# Patient Record
Sex: Female | Born: 1960 | Race: White | Hispanic: No | Smoking: Former smoker
Health system: Southern US, Community
[De-identification: ages and names within clinical notes are randomized; demographics above are authoritative.]

## PROBLEM LIST (undated history)

## (undated) DIAGNOSIS — N200 Calculus of kidney: Secondary | ICD-10-CM

## (undated) DIAGNOSIS — F419 Anxiety disorder, unspecified: Secondary | ICD-10-CM

## (undated) DIAGNOSIS — E119 Type 2 diabetes mellitus without complications: Secondary | ICD-10-CM

## (undated) DIAGNOSIS — F32A Depression, unspecified: Secondary | ICD-10-CM

## (undated) DIAGNOSIS — F329 Major depressive disorder, single episode, unspecified: Secondary | ICD-10-CM

## (undated) DIAGNOSIS — E785 Hyperlipidemia, unspecified: Secondary | ICD-10-CM

## (undated) DIAGNOSIS — I10 Essential (primary) hypertension: Secondary | ICD-10-CM

## (undated) HISTORY — PX: ULNAR NERVE REPAIR: SHX2594

## (undated) HISTORY — DX: Type 2 diabetes mellitus without complications: E11.9

## (undated) HISTORY — DX: Anxiety disorder, unspecified: F41.9

## (undated) HISTORY — PX: URETERAL STENT PLACEMENT: SHX822

## (undated) HISTORY — DX: Hyperlipidemia, unspecified: E78.5

## (undated) HISTORY — DX: Major depressive disorder, single episode, unspecified: F32.9

## (undated) HISTORY — DX: Depression, unspecified: F32.A

---

## 1997-12-13 ENCOUNTER — Emergency Department (HOSPITAL_COMMUNITY): Admission: EM | Admit: 1997-12-13 | Discharge: 1997-12-13 | Payer: Self-pay | Admitting: Emergency Medicine

## 1997-12-13 ENCOUNTER — Inpatient Hospital Stay (HOSPITAL_COMMUNITY): Admission: EM | Admit: 1997-12-13 | Discharge: 1997-12-13 | Payer: Self-pay | Admitting: Family Medicine

## 1997-12-17 ENCOUNTER — Inpatient Hospital Stay (HOSPITAL_COMMUNITY): Admission: AD | Admit: 1997-12-17 | Discharge: 1997-12-17 | Payer: Self-pay | Admitting: Obstetrics

## 1998-08-20 ENCOUNTER — Emergency Department (HOSPITAL_COMMUNITY): Admission: EM | Admit: 1998-08-20 | Discharge: 1998-08-20 | Payer: Self-pay

## 1999-01-10 ENCOUNTER — Other Ambulatory Visit: Admission: RE | Admit: 1999-01-10 | Discharge: 1999-01-10 | Payer: Self-pay | Admitting: Obstetrics and Gynecology

## 1999-07-24 ENCOUNTER — Encounter: Payer: Self-pay | Admitting: Emergency Medicine

## 1999-07-24 ENCOUNTER — Emergency Department (HOSPITAL_COMMUNITY): Admission: EM | Admit: 1999-07-24 | Discharge: 1999-07-24 | Payer: Self-pay | Admitting: Emergency Medicine

## 1999-09-18 ENCOUNTER — Encounter: Payer: Self-pay | Admitting: Emergency Medicine

## 1999-09-18 ENCOUNTER — Emergency Department (HOSPITAL_COMMUNITY): Admission: EM | Admit: 1999-09-18 | Discharge: 1999-09-18 | Payer: Self-pay | Admitting: Emergency Medicine

## 2000-01-31 ENCOUNTER — Ambulatory Visit (HOSPITAL_COMMUNITY): Admission: RE | Admit: 2000-01-31 | Discharge: 2000-01-31 | Payer: Self-pay | Admitting: General Surgery

## 2000-01-31 ENCOUNTER — Encounter (HOSPITAL_BASED_OUTPATIENT_CLINIC_OR_DEPARTMENT_OTHER): Payer: Self-pay | Admitting: General Surgery

## 2000-02-14 ENCOUNTER — Encounter (HOSPITAL_BASED_OUTPATIENT_CLINIC_OR_DEPARTMENT_OTHER): Payer: Self-pay | Admitting: General Surgery

## 2000-02-14 ENCOUNTER — Ambulatory Visit (HOSPITAL_COMMUNITY): Admission: RE | Admit: 2000-02-14 | Discharge: 2000-02-14 | Payer: Self-pay | Admitting: General Surgery

## 2000-11-06 ENCOUNTER — Other Ambulatory Visit: Admission: RE | Admit: 2000-11-06 | Discharge: 2000-11-06 | Payer: Self-pay | Admitting: Obstetrics and Gynecology

## 2001-10-10 ENCOUNTER — Emergency Department (HOSPITAL_COMMUNITY): Admission: EM | Admit: 2001-10-10 | Discharge: 2001-10-11 | Payer: Self-pay

## 2001-10-10 ENCOUNTER — Encounter: Payer: Self-pay | Admitting: Emergency Medicine

## 2002-10-26 ENCOUNTER — Other Ambulatory Visit: Admission: RE | Admit: 2002-10-26 | Discharge: 2002-10-26 | Payer: Self-pay | Admitting: Obstetrics and Gynecology

## 2002-10-27 ENCOUNTER — Ambulatory Visit (HOSPITAL_COMMUNITY): Admission: RE | Admit: 2002-10-27 | Discharge: 2002-10-27 | Payer: Self-pay | Admitting: Obstetrics and Gynecology

## 2002-10-27 ENCOUNTER — Encounter: Payer: Self-pay | Admitting: Obstetrics and Gynecology

## 2003-08-05 ENCOUNTER — Encounter: Payer: Self-pay | Admitting: Internal Medicine

## 2003-08-15 ENCOUNTER — Ambulatory Visit (HOSPITAL_COMMUNITY): Admission: RE | Admit: 2003-08-15 | Discharge: 2003-08-15 | Payer: Self-pay | Admitting: Internal Medicine

## 2003-09-03 ENCOUNTER — Emergency Department (HOSPITAL_COMMUNITY): Admission: EM | Admit: 2003-09-03 | Discharge: 2003-09-03 | Payer: Self-pay | Admitting: Emergency Medicine

## 2003-09-06 ENCOUNTER — Inpatient Hospital Stay (HOSPITAL_COMMUNITY): Admission: AD | Admit: 2003-09-06 | Discharge: 2003-09-06 | Payer: Self-pay | Admitting: Obstetrics & Gynecology

## 2003-09-14 ENCOUNTER — Observation Stay (HOSPITAL_COMMUNITY): Admission: EM | Admit: 2003-09-14 | Discharge: 2003-09-15 | Payer: Self-pay | Admitting: Emergency Medicine

## 2003-09-21 ENCOUNTER — Encounter: Admission: RE | Admit: 2003-09-21 | Discharge: 2003-12-20 | Payer: Self-pay | Admitting: Emergency Medicine

## 2004-05-31 IMAGING — CT CT EXTREM LOW BILAT W/ CM
1 of 2 series · 8 of 14 positions shown, 10 images · IV contrast (omnipaque)
Comparison: None.

CLINICAL DATA: Chest pain.
 CT CHEST AND LOWER EXTREMITY LIMITED BILATERAL WITH CONTRAST, 09/03/03, 8999 HOURS
TECHNIQUE: After the intravenous injection of Omnipaque 300, 1 mm spiral images were obtained through the chest.
 CHEST

[Series 2: *don't forget:recon (date) offon · axial · 0.62mm/px · z∈[-275,-56]mm · 8 of 225 slices shown, 10 images]
[im 25/225  soft-tissue]
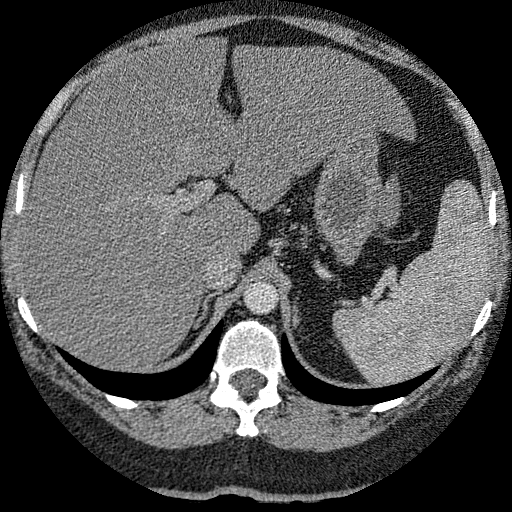
[im 25/225  bone]
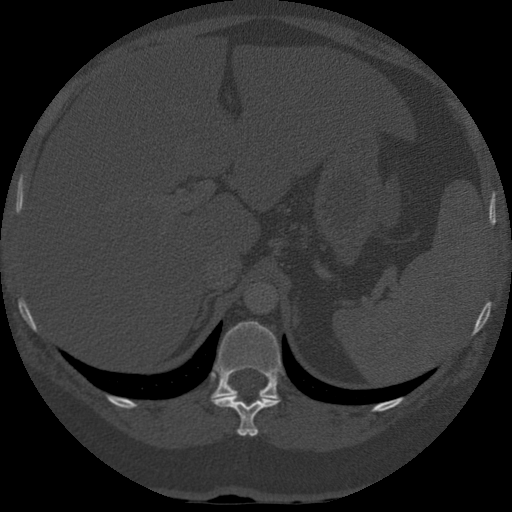
[im 50/225  bone]
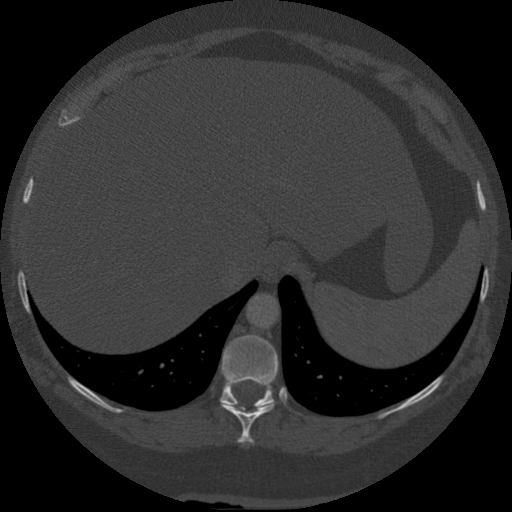
[im 75/225  bone]
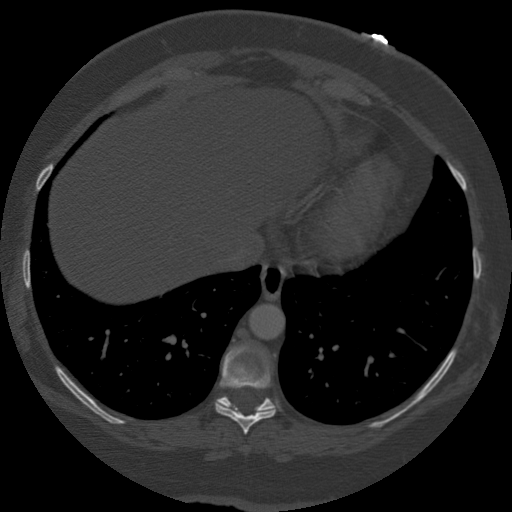
[im 100/225  bone]
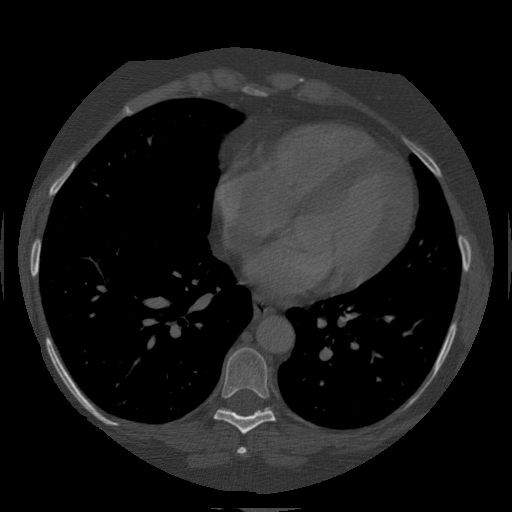
[im 125/225  soft-tissue]
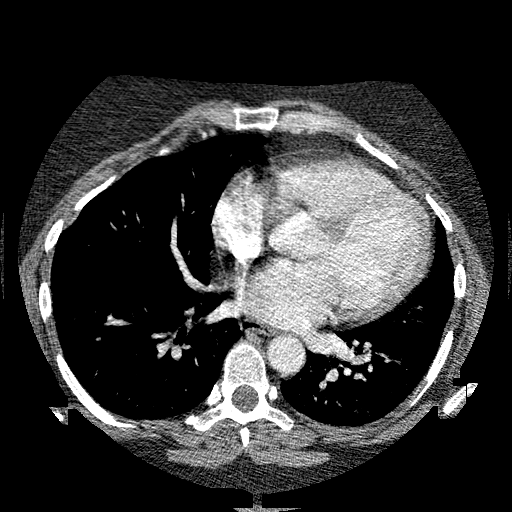
[im 125/225  bone]
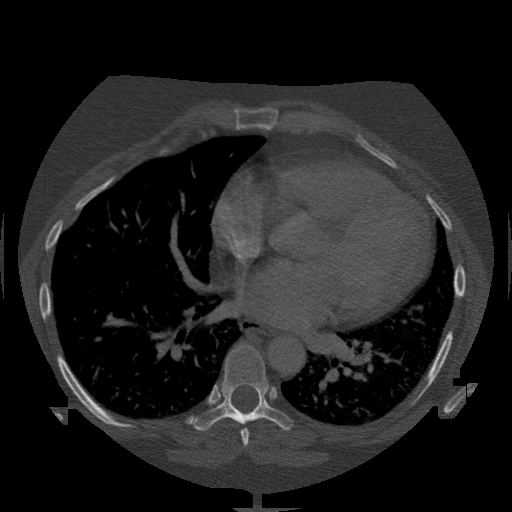
[im 150/225  bone]
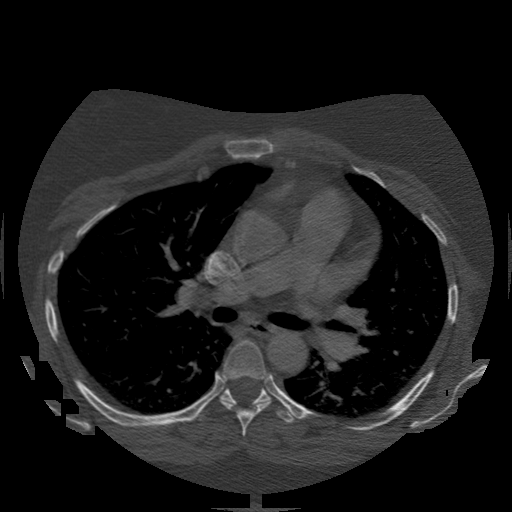
[im 175/225  bone]
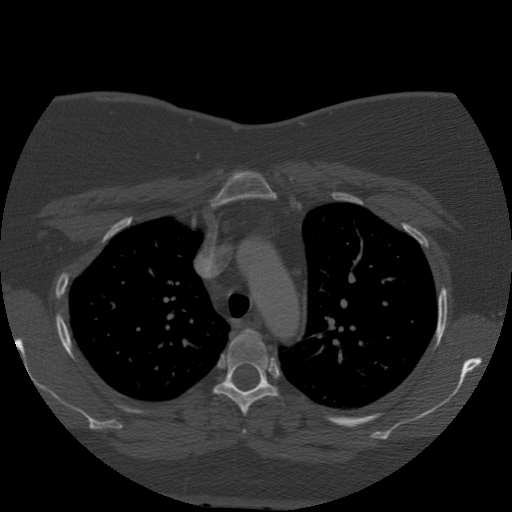
[im 200/225  bone]
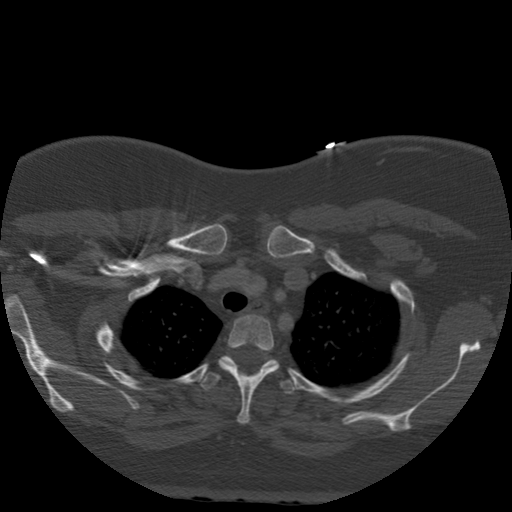

[8 of 14 positions shown; findings below may reference images not displayed]

FINDINGS: There is suboptimal opacification of the pulmonary arteries due to the patient?s body habitus and contrast concentration in the pulmonary vasculature.  No definite filling defects are seen in the pulmonary arterial tree to suggest pulmonary thromboembolism.
 Within the mediastinum, there is no evidence of abnormal adenopathy.  Small nodes are identified.  
 No pneumothoraces or effusions are seen.  
 The lungs are clear.  Images of the upper abdomen demonstrate no focal abnormality.
 IMPRESSION 
 No evidence of pulmonary thromboembolism although the exam was somewhat suboptimal due to respiratory motion and body habitus of the patient.  
 CT LOWER EXTREMITY
FINDINGS: No filling defects are seen in the venous system of the lower extremities to suggest DVT.  A small amount of free fluid is noted in the pelvis, nonspecific.
 IMPRESSION 
 No evidence of DVT.
 [REDACTED]

## 2004-07-05 ENCOUNTER — Emergency Department (HOSPITAL_COMMUNITY): Admission: EM | Admit: 2004-07-05 | Discharge: 2004-07-05 | Payer: Self-pay | Admitting: Family Medicine

## 2004-11-14 ENCOUNTER — Ambulatory Visit: Payer: Self-pay | Admitting: Internal Medicine

## 2004-11-22 ENCOUNTER — Ambulatory Visit (HOSPITAL_COMMUNITY): Admission: RE | Admit: 2004-11-22 | Discharge: 2004-11-22 | Payer: Self-pay | Admitting: Internal Medicine

## 2004-12-03 ENCOUNTER — Ambulatory Visit (HOSPITAL_COMMUNITY): Admission: RE | Admit: 2004-12-03 | Discharge: 2004-12-03 | Payer: Self-pay | Admitting: Obstetrics and Gynecology

## 2004-12-04 ENCOUNTER — Ambulatory Visit: Payer: Self-pay | Admitting: Internal Medicine

## 2004-12-12 ENCOUNTER — Other Ambulatory Visit: Admission: RE | Admit: 2004-12-12 | Discharge: 2004-12-12 | Payer: Self-pay | Admitting: Obstetrics and Gynecology

## 2005-08-08 ENCOUNTER — Emergency Department (HOSPITAL_COMMUNITY): Admission: EM | Admit: 2005-08-08 | Discharge: 2005-08-08 | Payer: Self-pay | Admitting: Emergency Medicine

## 2005-08-14 ENCOUNTER — Ambulatory Visit (HOSPITAL_COMMUNITY): Admission: RE | Admit: 2005-08-14 | Discharge: 2005-08-14 | Payer: Self-pay | Admitting: Obstetrics and Gynecology

## 2005-08-14 ENCOUNTER — Encounter (INDEPENDENT_AMBULATORY_CARE_PROVIDER_SITE_OTHER): Payer: Self-pay | Admitting: Specialist

## 2006-11-18 ENCOUNTER — Emergency Department (HOSPITAL_COMMUNITY): Admission: EM | Admit: 2006-11-18 | Discharge: 2006-11-19 | Payer: Self-pay | Admitting: Emergency Medicine

## 2006-12-29 ENCOUNTER — Other Ambulatory Visit: Admission: RE | Admit: 2006-12-29 | Discharge: 2006-12-29 | Payer: Self-pay | Admitting: Obstetrics and Gynecology

## 2007-05-18 ENCOUNTER — Ambulatory Visit (HOSPITAL_COMMUNITY): Admission: RE | Admit: 2007-05-18 | Discharge: 2007-05-18 | Payer: Self-pay | Admitting: *Deleted

## 2008-03-14 ENCOUNTER — Other Ambulatory Visit: Admission: RE | Admit: 2008-03-14 | Discharge: 2008-03-14 | Payer: Self-pay | Admitting: Obstetrics and Gynecology

## 2008-05-27 DIAGNOSIS — Z860100 Personal history of colon polyps, unspecified: Secondary | ICD-10-CM | POA: Insufficient documentation

## 2008-06-02 ENCOUNTER — Ambulatory Visit: Payer: Self-pay | Admitting: Internal Medicine

## 2008-06-02 LAB — CONVERTED CEMR LAB
ALT: 51 units/L — ABNORMAL HIGH (ref 0–35)
AST: 41 units/L — ABNORMAL HIGH (ref 0–37)
Albumin: 4 g/dL (ref 3.5–5.2)
Alkaline Phosphatase: 66 units/L (ref 39–117)
Amylase: 26 units/L — ABNORMAL LOW (ref 27–131)
Basophils Absolute: 0.1 10*3/uL (ref 0.0–0.1)
Basophils Relative: 0.6 % (ref 0.0–3.0)
Bilirubin, Direct: 0.1 mg/dL (ref 0.0–0.3)
Eosinophils Absolute: 0.2 10*3/uL (ref 0.0–0.7)
Eosinophils Relative: 2 % (ref 0.0–5.0)
HCT: 41.2 % (ref 36.0–46.0)
Hemoglobin: 13.9 g/dL (ref 12.0–15.0)
Lipase: 23 units/L (ref 11.0–59.0)
Lymphocytes Relative: 36.4 % (ref 12.0–46.0)
MCHC: 33.6 g/dL (ref 30.0–36.0)
MCV: 89.3 fL (ref 78.0–100.0)
Monocytes Absolute: 0.5 10*3/uL (ref 0.1–1.0)
Monocytes Relative: 6 % (ref 3.0–12.0)
Neutro Abs: 4.9 10*3/uL (ref 1.4–7.7)
Neutrophils Relative %: 55 % (ref 43.0–77.0)
Platelets: 247 10*3/uL (ref 150–400)
RBC: 4.62 M/uL (ref 3.87–5.11)
RDW: 12.9 % (ref 11.5–14.6)
Total Bilirubin: 0.8 mg/dL (ref 0.3–1.2)
Total Protein: 7 g/dL (ref 6.0–8.3)
WBC: 8.9 10*3/uL (ref 4.5–10.5)

## 2008-06-08 ENCOUNTER — Ambulatory Visit (HOSPITAL_COMMUNITY): Admission: RE | Admit: 2008-06-08 | Discharge: 2008-06-08 | Payer: Self-pay | Admitting: Internal Medicine

## 2008-06-15 ENCOUNTER — Ambulatory Visit (HOSPITAL_COMMUNITY): Admission: RE | Admit: 2008-06-15 | Discharge: 2008-06-15 | Payer: Self-pay | Admitting: Internal Medicine

## 2008-06-16 ENCOUNTER — Telehealth: Payer: Self-pay | Admitting: Internal Medicine

## 2008-06-21 ENCOUNTER — Ambulatory Visit: Payer: Self-pay | Admitting: Internal Medicine

## 2008-06-21 LAB — CONVERTED CEMR LAB
A-1 Antitrypsin, Ser: 133 mg/dL (ref 83–200)
Anti Nuclear Antibody(ANA): NEGATIVE
Ceruloplasmin: 51 mg/dL (ref 21–63)
Ferritin: 150.3 ng/mL (ref 10.0–291.0)
HCV Ab: NEGATIVE
Hep B Core Total Ab: NEGATIVE
Hep B S Ab: NEGATIVE
Hepatitis B Surface Ag: NEGATIVE

## 2009-01-12 ENCOUNTER — Emergency Department (HOSPITAL_COMMUNITY): Admission: EM | Admit: 2009-01-12 | Discharge: 2009-01-12 | Payer: Self-pay | Admitting: Family Medicine

## 2009-05-30 ENCOUNTER — Other Ambulatory Visit: Admission: RE | Admit: 2009-05-30 | Discharge: 2009-05-30 | Payer: Self-pay | Admitting: Obstetrics and Gynecology

## 2010-09-23 ENCOUNTER — Encounter: Payer: Self-pay | Admitting: Internal Medicine

## 2010-11-04 ENCOUNTER — Inpatient Hospital Stay (HOSPITAL_COMMUNITY)
Admission: EM | Admit: 2010-11-04 | Discharge: 2010-11-05 | DRG: 694 | Disposition: A | Discharge status: Home / Self Care | Payer: PRIVATE HEALTH INSURANCE | Attending: Urology | Admitting: Urology

## 2010-11-04 DIAGNOSIS — N201 Calculus of ureter: Principal | ICD-10-CM | POA: Diagnosis present

## 2010-11-04 DIAGNOSIS — G43909 Migraine, unspecified, not intractable, without status migrainosus: Secondary | ICD-10-CM | POA: Diagnosis present

## 2010-11-04 DIAGNOSIS — Z794 Long term (current) use of insulin: Secondary | ICD-10-CM

## 2010-11-04 DIAGNOSIS — E7800 Pure hypercholesterolemia, unspecified: Secondary | ICD-10-CM | POA: Diagnosis present

## 2010-11-04 DIAGNOSIS — I10 Essential (primary) hypertension: Secondary | ICD-10-CM | POA: Diagnosis present

## 2010-11-04 DIAGNOSIS — N10 Acute tubulo-interstitial nephritis: Secondary | ICD-10-CM | POA: Diagnosis present

## 2010-11-04 DIAGNOSIS — E119 Type 2 diabetes mellitus without complications: Secondary | ICD-10-CM | POA: Diagnosis present

## 2010-11-04 HISTORY — DX: Essential (primary) hypertension: I10

## 2010-11-05 ENCOUNTER — Encounter (HOSPITAL_COMMUNITY): Payer: Self-pay | Admitting: Radiology

## 2010-11-05 ENCOUNTER — Emergency Department (HOSPITAL_COMMUNITY): Payer: PRIVATE HEALTH INSURANCE

## 2010-11-05 ENCOUNTER — Inpatient Hospital Stay (HOSPITAL_COMMUNITY)
Admission: AD | Admit: 2010-11-05 | Discharge: 2010-11-08 | Disposition: A | Discharge status: Home / Self Care | Payer: PRIVATE HEALTH INSURANCE | Attending: Urology | Admitting: Urology

## 2010-11-05 DIAGNOSIS — I10 Essential (primary) hypertension: Secondary | ICD-10-CM | POA: Diagnosis present

## 2010-11-05 DIAGNOSIS — R509 Fever, unspecified: Secondary | ICD-10-CM | POA: Diagnosis present

## 2010-11-05 DIAGNOSIS — Z794 Long term (current) use of insulin: Secondary | ICD-10-CM

## 2010-11-05 DIAGNOSIS — E119 Type 2 diabetes mellitus without complications: Secondary | ICD-10-CM | POA: Diagnosis present

## 2010-11-05 DIAGNOSIS — N201 Calculus of ureter: Secondary | ICD-10-CM | POA: Diagnosis present

## 2010-11-05 DIAGNOSIS — N12 Tubulo-interstitial nephritis, not specified as acute or chronic: Secondary | ICD-10-CM | POA: Diagnosis present

## 2010-11-05 LAB — CBC
HCT: 38.9 % (ref 36.0–46.0)
Hemoglobin: 13 g/dL (ref 12.0–15.0)
MCH: 28.8 pg (ref 26.0–34.0)
MCHC: 33.4 g/dL (ref 30.0–36.0)
MCV: 86.1 fL (ref 78.0–100.0)
Platelets: 161 10*3/uL (ref 150–400)
RBC: 4.52 MIL/uL (ref 3.87–5.11)
RDW: 13.7 % (ref 11.5–15.5)
WBC: 14.2 10*3/uL — ABNORMAL HIGH (ref 4.0–10.5)

## 2010-11-05 LAB — POCT I-STAT, CHEM 8
BUN: 6 mg/dL (ref 6–23)
Calcium, Ion: 1.13 mmol/L (ref 1.12–1.32)
Creatinine, Ser: 0.8 mg/dL (ref 0.4–1.2)
Glucose, Bld: 145 mg/dL — ABNORMAL HIGH (ref 70–99)
HCT: 40 % (ref 36.0–46.0)
Sodium: 135 mEq/L (ref 135–145)
TCO2: 23 mmol/L (ref 0–100)

## 2010-11-05 LAB — DIFFERENTIAL
Basophils Absolute: 0 10*3/uL (ref 0.0–0.1)
Basophils Relative: 0 % (ref 0–1)
Blasts: 0 %
Eosinophils Absolute: 0 10*3/uL (ref 0.0–0.7)
Eosinophils Relative: 0 % (ref 0–5)
Lymphocytes Relative: 12 % (ref 12–46)
Lymphs Abs: 1.7 10*3/uL (ref 0.7–4.0)
Metamyelocytes Relative: 1 %
Monocytes Absolute: 0.1 10*3/uL (ref 0.1–1.0)
Monocytes Relative: 1 % — ABNORMAL LOW (ref 3–12)
Myelocytes: 0 %
Neutro Abs: 12.4 10*3/uL — ABNORMAL HIGH (ref 1.7–7.7)
Neutrophils Relative %: 58 % (ref 43–77)
Promyelocytes Absolute: 0 %

## 2010-11-05 LAB — GLUCOSE, CAPILLARY
Glucose-Capillary: 147 mg/dL — ABNORMAL HIGH (ref 70–99)
Glucose-Capillary: 120 mg/dL — ABNORMAL HIGH (ref 70–99)
Glucose-Capillary: 194 mg/dL — ABNORMAL HIGH (ref 70–99)
Glucose-Capillary: 216 mg/dL — ABNORMAL HIGH (ref 70–99)

## 2010-11-05 LAB — URINALYSIS, ROUTINE W REFLEX MICROSCOPIC
Bilirubin Urine: NEGATIVE
Ketones, ur: NEGATIVE mg/dL
Nitrite: NEGATIVE
Protein, ur: NEGATIVE mg/dL
Specific Gravity, Urine: <1.005 — ABNORMAL LOW (ref 1.005–1.030)
Urobilinogen, UA: 0.2 mg/dL (ref 0.0–1.0)
pH: 7 (ref 5.0–8.0)

## 2010-11-05 LAB — URINE MICROSCOPIC-ADD ON

## 2010-11-05 MED ORDER — IOHEXOL 300 MG/ML  SOLN
100.0000 mL | Freq: Once | INTRAMUSCULAR | Status: AC | PRN
  Administered 2010-11-05: 100 mL via INTRAVENOUS

## 2010-11-06 LAB — URINE CULTURE: Culture  Setup Time: 201203050545

## 2010-11-06 LAB — GENTAMICIN LEVEL, RANDOM: Gentamicin Rm: 2.3 ug/mL

## 2010-11-06 LAB — CBC
HCT: 31.3 % — ABNORMAL LOW (ref 36.0–46.0)
MCHC: 31 g/dL (ref 30.0–36.0)
MCV: 88.7 fL (ref 78.0–100.0)
RDW: 14.3 % (ref 11.5–15.5)

## 2010-11-06 LAB — BASIC METABOLIC PANEL
BUN: 10 mg/dL (ref 6–23)
GFR calc non Af Amer: >60 mL/min (ref >60)
Glucose, Bld: 180 mg/dL — ABNORMAL HIGH (ref 70–99)
Potassium: 3.3 mEq/L — ABNORMAL LOW (ref 3.5–5.1)

## 2010-11-07 LAB — GLUCOSE, CAPILLARY
Glucose-Capillary: 127 mg/dL — ABNORMAL HIGH (ref 70–99)
Glucose-Capillary: 175 mg/dL — ABNORMAL HIGH (ref 70–99)
Glucose-Capillary: 157 mg/dL — ABNORMAL HIGH (ref 70–99)

## 2010-11-08 LAB — GLUCOSE, CAPILLARY

## 2010-11-14 NOTE — Op Note (Addendum)
  NAMEMANJIT, BUFANO NO.:  [id number]  MEDICAL RECORD NO.:  [id number]           PATIENT TYPE:  I  LOCATION:  1411                         FACILITY:  [facility]  PHYSICIAN:  Bertram Millard. Delno Blaisdell, M.D.DATE OF BIRTH:  [birth year]  DATE OF PROCEDURE:  11/05/2010 DATE OF DISCHARGE:                              OPERATIVE REPORT   PREOPERATIVE DIAGNOSES:  Infected, obstructing right upper ureteral stone.  POSTOPERATIVE DIAGNOSIS:  Infected, obstructing right upper ureteral stone.  PRINCIPAL PROCEDURE:  Cystoscopy, right double-J stent placement (24 cm x 6-French without string).  SURGEON:  Bertram Millard. Tadan Shill, M.D.  ANESTHESIA:  General endotracheal.  COMPLICATIONS:  None.  BRIEF HISTORY:  A 50 year old female who presented yesterday to the emergency room at [facility] with right-sided pain and fever. The pain started about a day before.  The patient developed fever up to 102, as well as rigors.  Upon presentation, she was found to have an obstructing right-sided upper ureteral stone, 5 mm in size, as well as infected urine.  The patient's temperature was 102.  Urology was consulted.  I discussed the situation with the patient and her son, Ivin Booty.  As she is diabetic, and has been infected, obstructive stone, it was recommended that she undergo emergent stent placement.  Risks and complications of procedure have been discussed.  She understands these and desires to proceed.  DESCRIPTION OF PROCEDURE:  The patient was identified and marked in TCU. She was taken to the operating room were general anesthetic was administered.  She was placed in the dorsal lithotomy position. Genitalia and perineum were prepped and draped.  Time-out was then called.  Procedure then commenced.  A 22-French Panendoscope was advanced into her bladder which was drained.  Cursory inspection of the bladder was normal.  The right ureteral orifice was cannulated with  sensitive guidewire which advanced passed the stone in the right renal pelvis. Once good curl was seen, 6-French x 24 cm contour double-J stent was placed over top of the guidewire.  After adequate positioning, the guidewire was removed.  Good proximal distal curls were seen on the stent.  At this point, the bladder was drained and the procedure terminated.  The scope was removed.  The patient tolerated the procedure well.  She was awakened and taken to PACU in stable condition.     Bertram Millard. Retta Diones, M.D.     SMD/MEDQ  D:  11/05/2010  T:  11/05/2010  Job:  [id number]  Electronically Signed by Marcine Matar M.D. on 11/14/2010 10:32:49 AM

## 2010-11-16 NOTE — Discharge Summary (Addendum)
  Courtney Parks, SELBE NO.:  [id number]  MEDICAL RECORD NO.:  [id number]           PATIENT TYPE:  I  LOCATION:  1411                         FACILITY:  [facility]  PHYSICIAN:  Maretta Bees. Vonita Moss, M.D.DATE OF BIRTH:  08-Nov-1960  DATE OF ADMISSION:  11/05/2010 DATE OF DISCHARGE:                              DISCHARGE SUMMARY   FINAL DIAGNOSES: 1. Right upper ureteral stone. 2. Right pyelonephritis. 3. Diabetes. 4. Migraine headaches. 5. Hypertension.  PROCEDURES:  Cystoscopy and right double-J catheter placement on November 05, 2010.  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  On admission, blood pressure was 138/62, pulse 111, temperature 99.8.  She had some right CVA tenderness.  HOSPITAL COURSE:  After admission, she underwent cystoscopy and right ureteral catheter placement.  Urine was cultured and grew Klebsiella. It was sensitive to both Cipro and gentamicin.  She was initially given gentamicin, and then on the day of discharge, given another dose.  In the meantime, she has been on Cipro.  She had some fever last night but she is feeling quite well and voiding well, except for slightly bloody urine.  She is taking fluids well and wishes to go home.  She looks clinically stable with normal vital signs other than the fever last night.  She is afebrile this morning.  She knows to take her temperature and return p.r.n. any problem with clinical deterioration.  She will resume her diabetic diet.  She was sent home in improved condition.  She will return to the office next week in followup.  In addition to going home on Cipro 0.5 mg b.i.d. and oxycodone 5/325.  DISCHARGE MEDICATIONS: 1. Toprol XL 100 mg daily. 2. Diovan HCT 320/25 daily. 3. Welchol 625 mg b.i.d. 4. Ibuprofen p.r.n. 5. Lantus insulin 50 units each morning. 6. Multivitamins. 7. Calcium  carbonate over-the-counter. 8. Metformin 1000 mg daily.     Maretta Bees. Vonita Moss, M.D.     LJP/MEDQ  D:  11/08/2010  T:  11/08/2010  Job:  [id number]  Electronically Signed by Larey Dresser M.D. on 11/16/2010 09:30:36 AM

## 2010-11-19 ENCOUNTER — Ambulatory Visit (HOSPITAL_COMMUNITY)
Admission: RE | Admit: 2010-11-19 | Discharge: 2010-11-19 | Disposition: A | Discharge status: Home / Self Care | Payer: PRIVATE HEALTH INSURANCE | Source: Ambulatory Visit | Attending: Urology | Admitting: Urology

## 2010-11-19 DIAGNOSIS — N201 Calculus of ureter: Secondary | ICD-10-CM | POA: Insufficient documentation

## 2010-11-19 DIAGNOSIS — N1330 Unspecified hydronephrosis: Secondary | ICD-10-CM | POA: Insufficient documentation

## 2010-11-19 DIAGNOSIS — E119 Type 2 diabetes mellitus without complications: Secondary | ICD-10-CM | POA: Insufficient documentation

## 2010-11-20 LAB — GLUCOSE, CAPILLARY: Glucose-Capillary: 246 mg/dL — ABNORMAL HIGH (ref 70–99)

## 2010-11-27 ENCOUNTER — Other Ambulatory Visit (HOSPITAL_COMMUNITY): Payer: Self-pay | Admitting: Urology

## 2010-11-27 DIAGNOSIS — T83122A Displacement of urinary stent, initial encounter: Secondary | ICD-10-CM

## 2010-12-21 NOTE — Discharge Summary (Signed)
  NAMERAINIE, CRENSHAW NO.:  [id number]  MEDICAL RECORD NO.:  [id number]           PATIENT TYPE:  I  LOCATION:  2550                         FACILITY:  [facility]  PHYSICIAN:  Bertram Millard. Joniah Bednarski, M.D.DATE OF BIRTH:  [birth year]  DATE OF ADMISSION:  11/04/2010 DATE OF DISCHARGE:  11/05/2010                              DISCHARGE SUMMARY   PRINCIPAL PROCEDURE:  Cystoscopy, right double-J stent placement.  BRIEF HISTORY:  A 50 year old female with fever and right-sided pain. She had a fever to 102, and was found, upon presentation to the emergency room, to have a 5 mm upper ureteral stone.  She was admitted for emergent ureteral catheter placement, and will eventually undergo lithotripsy or secondary procedure for her stone.  Admission data included a white count of 14,000, and normal hematocrit of 39%, and a normal platelet count.  Basic metabolic profile was normal except for glucose of 145.  CT revealed right proximal ureteral stone with hydronephrosis. Urinalysis was infected.  HOSPITAL COURSE:  The patient was admitted to the operating room, where she underwent cystoscopy and double-J stent placement.  This was performed without difficulty.  She was sent to the floor postoperatively, and did well, without significant febrile response. She was placed on IV antibiotics.  By November 05, 2010, she was in improved condition, and was felt that she could be discharged.  She was discharged in improved condition.  She will follow up with me in approximately 1-2 weeks.  DISCHARGE MEDICATIONS:  Antibiotic and oxybutynin.     Bertram Millard. Retta Diones, M.D.     SMD/MEDQ  D:  12/19/2010  T:  12/20/2010  Job:  [id number]  Electronically Signed by Marcine Matar M.D. on 12/21/2010 07:47:14 AM

## 2011-01-18 NOTE — H&P (Signed)
NAMEFLORENTINA, Parks NO.:  [id number]   MEDICAL RECORD NO.:  [id number]                   PATIENT TYPE:  OBV   LOCATION:  1824                                 FACILITY:  [facility]   PHYSICIAN:  Quita Skye. Waldon Reining, MD             DATE OF BIRTH:  1961/08/18   DATE OF ADMISSION:  09/13/2003  DATE OF DISCHARGE:                                HISTORY & PHYSICAL   IDENTIFYING DATA AND CHIEF COMPLAINT:  Courtney Parks is a 50 year old white  woman who is admitted to [facility] for further evaluation of chest  pain.   HISTORY OF PRESENT ILLNESS:  The patient presented to the emergency  department via EMS after experiencing an episode of chest pain at home.  This occurred while she was sleeping.  The chest discomfort is described as  a sharp and pressure sensation, which began in a focal area in the left  inframammary area.  It radiating down her left leg and up to her neck.  It  was not associated with dyspnea, diaphoresis or nausea.  There were no  exacerbating or ameliorating factors.  She also noted a full sensation in  her throat.  The total duration of chest discomfort was approximately five  minutes.  She was given nitroglycerin by EMS, but her chest discomfort had  already resolved by then.  The chest discomfort appeared not to be related  to position, activity, meals or respirations.  She is free of chest pain at  this time.   The patient had a similar such episode 10 days ago, which also brought her  to the emergency department.  She was discharged with the recommendation to  follow up with Dr. Donnie Aho.  She has seen Dr. Donnie Aho and a stress Cardiolite  has been scheduled, but not yet performed.  The patient feels well and  asymptomatic at this time.   The patient has no past history of cardiac disease including no history of  chest pain, myocardial infarction, coronary artery disease, or congestive  heart failure.  She has a history of  hypertension for several years.  Hyperlipidemia and diabetes mellitus have been recently diagnosed.  There is  no history of smoking.  There is no family history of early coronary artery  disease.   PAST MEDICAL HISTORY:  The patient has no other medical problems.   ALLERGIES:  The patient is reportedly allergic to CODEINE; causes nausea.   MEDICATIONS:  The patient's current medications include:  1. Diovan 160 mg p.o. daily.  2. Glucophage 500 mg p.o. daily.  3. Toprol XL 100 mg p.o. daily.  4. Aspirin 81 mg p.o. daily.   PAST SURGICAL HISTORY:  Previous operations have included a left ulnar nerve  release as well as a septoplasty.   ACCIDENTS AND INJURIES:  Significant injuries; none.   SOCIAL HISTORY:  The patient is divorced.  She lives with her  65 year old  son.  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  She does not drink.  She  does not use illicit drugs.   FAMILY HISTORY:  The patient's mother is alive and well with the exception  of hypertension.  Her mother has been treated for colon cancer in the past.  Her father is alive and well as best as she knows, but with hypertension.  The patient has a sister and a brother, both of whom are well.   REVIEW OF SYSTEMS:  Review of systems reveals no problems related to her  head, eyes, ears, nose, mouth, throat, lungs, gastrointestinal system,  genitourinary system, and extremities.  There is no history of neurologic or  psychiatric disorder.  There is no history of fever, chills or weight loss.   PHYSICAL EXAMINATION:  VITAL SIGNS:  Blood pressure 132/71, pulse 87 and  regular, respirations 20 and temperature 98.4.  GENERAL APPEARANCE:  The patient is an obese middle-aged white woman in no  discomfort.  She is alert, oriented, appropriate and responsive.  HEENT:  Head, eyes, nose and mouth are normal.  NECK:  The neck is without thyromegaly or adenopathy.  Carotid pulses are  palpable bilaterally and without bruits.  HEART:   Cardiac examination reveals a normal S1 and S2.  There is no S3, S4,  murmur, rub, or click.  Cardiac rhythm is regular.  CHEST:  No chest wall tenderness is noted.  LUNGS:  The lungs are clear.  ABDOMEN:  The abdomen is soft and nontender.  There is no mass,  hepatosplenomegaly, bruit, distention, rebound, or rigidity.  Bowel sounds  are normal.  BREASTS, PELVIC AND RECTAL:  Breast, pelvic and rectal examinations are not  performed as they are not pertinent to the reason for acute care  hospitalization.  EXTREMITIES:  The extremities are without edema, deviation or deformity.  Radial and dorsalis pedal pulses are palpable bilaterally.  NEUROLOGIC EXAMINATION:  Brief screening neurologic survey is unremarkable.   LABORATORY DATA:  The electrocardiogram is normal.  The chest radiograph is  normal according to the radiologist.  The initial set of cardiac markers  revealed a myoglobin of 20.1, CK MB 1.8 and troponin less than 0.05.  Potassium 4.1, BUN 14 and creatinine 0.3.  The remaining studies are pending  at the time of this dictation.   IMPRESSION:  1. Chest pain, rule out coronary artery disease.  Description of chest pain     is detailed above.  2. Hypertension.  3. Hyperlipidemia.  4. Diabetes mellitus.   PLAN:  1. Telemetry.  2. Serial cardiac enzymes.  3. Aspirin.  4. Heparin.  5. Nitrol Paste.  6. Chest and abdominal CT scans to exclude pulmonary embolus and aortic     dissection.  7. Further measures per Dr. Donnie Aho.   ADDENDUM:  The patient's son was present throughout the patient encounter.                                               Quita Skye. Waldon Reining, MD   MSC/MEDQ  D:  09/14/2003  T:  09/14/2003  Job:  [id number]

## 2011-01-18 NOTE — Cardiovascular Report (Signed)
Courtney Parks, Courtney Parks NO.:  [id number]   MEDICAL RECORD NO.:  [id number]                   PATIENT TYPE:  OBV   LOCATION:  4739                                 FACILITY:  [facility]   PHYSICIAN:  W. Ashley Royalty., M.D.         DATE OF BIRTH:  22-Aug-1961   DATE OF PROCEDURE:  09/15/2003  DATE OF DISCHARGE:                              CARDIAC CATHETERIZATION   HISTORY:  A 50 year old female with diabetes, hypertension, and  hyperlipidemia.  She recently has had two prolonged episodes of chest pain  that precipitated emergency room visits and was admitted to rule out an MI.   COMMENTS ABOUT PROCEDURE:  The patient tolerated the procedure well without  difficulty.  The right femoral artery was entered using a single anterior  needle wall stick.  Following the procedure she had good hemostasis and  peripheral pulses present.   HEMODYNAMIC DATA:  1. Aorta post contrast 144/87.  2. LV post contrast 144/9-19.   ANGIOGRAPHIC DATA:  1. Left ventriculogram:  Performed in the 30 degree RAO projection.  The     aortic valve was normal.  The mitral valve was normal.  The left     ventricle appears normal in size.  Estimated ejection fraction is 60%.     Coronary arteries arise and distribute normally.  There is no significant     calcification noted.  2. Left main coronary artery is normal.  3. Left anterior descending:  The vessel terminates on the distal anterior     wall and has a single diagonal branch.  No significant disease is noted.  4. Circumflex:  A moderate sized intermediate branch arises which is normal.     The circumflex has a single marginal artery and is normal.  5. Right coronary artery is a large vessel.  The posterior descending     supplies the apex and there is also a large posterolateral branch.  This     vessel is also normal.   IMPRESSION:  1. Normal left ventricular function.  2. Normal coronary arteries.   RECOMMENDATIONS:   Extensive risk factor modification.  Treatment of  diabetes, hypertension, and hyperlipidemia.                                               Courtney Parks., M.D.    WST/MEDQ  D:  09/15/2003  T:  09/15/2003  Job:  [id number]   cc:   Oley Balm. Georgina Pillion, M.D.  [address]  [place]  [place] [address]  Fax: [phone]

## 2011-01-18 NOTE — H&P (Signed)
NAMECHARISE, LEINBACH NO.:  [id number]   MEDICAL RECORD NO.:  [id number]          PATIENT TYPE:  AMB   LOCATION:  SDC                           FACILITY:  [facility]   PHYSICIAN:  Artist Pais, M.D.    DATE OF BIRTH:  [birth year]   DATE OF ADMISSION:  08/14/2005  DATE OF DISCHARGE:                                HISTORY & PHYSICAL   HISTORY OF PRESENT ILLNESS:  The patient is a 50 year old para 1 Caucasian  female who presented in mid-November complaining of amenorrhea for two  months.  She was also having some hot flashes.  She questioned if she were  menopausal.  Her FSH and TSH were performed and noted to be normal; thus,  she was noted not to be menopausal and she did not have any thyroid  dysfunction.  Her prolactin was normal as well.  Because of her body  habitus, she underwent a pelvic ultrasound to assess her endometrial lining.  She was found to have small uterine fibroids.  Her endometrium was thickened  with no discrete mass seen.  Endometrium was noted to be quite thick at 1.53  cm.  However, she was noted to have some submucosal fibroids.  She  subsequently underwent a sonohysterogram and her total endometrial thickness  was found to be 12 mm.  She was also found to have a fundal polyp measuring  6 x 4 mm, and it was suggested that she undergo a D&C, hysteroscopy and  polypectomy.  Risks of surgery, including anesthetic complication,  hemorrhage, infection, damage to adjacent structures including bladder,  bowel, blood vessels or ureter, were discussed with the patient.  She was  made aware of the risk of uterine perforation, which could result in  overwhelming life-threatening hemorrhage requiring emergent hysterectomy.  She was also made aware of the risk of uterine perforation, which could  result in bowel damage and subsequently require an emergent colostomy or  which could result in overwhelming life-threatening peritonitis.  She  expressed an  understanding of and acceptance of these risks and desires to  proceed with surgery.   PAST MEDICAL HISTORY:  1.  History of hypertension.  2.  Type 2 diabetes.  3.  Reflux.  4.  History of cholecystitis.   The patient is to see Dr. Georgina Pillion for surgical clearance.  She does not have  a history of mitral valve prolapse and does not take SBE prophylaxis.   ALLERGIES:  No known drug allergies.   CURRENT MEDICATIONS:  1.  Toprol XL 100 mg daily.  2.  Diovan 160 mg daily.  3.  Hydrochlorothiazide 25 mg daily.  4.  Protonix 40 mg daily.  5.  Metformin 1000 mg daily.   PAST SURGICAL HISTORY:  Surgery to the left arm x2 in 1982.   FAMILY HISTORY:  There is no family history of breast, ovarian or prostate  cancer.  Her mother has a history of colon cancer and has hypertension.  Her  father is in his 15s with hypertension.  She has two siblings, ages 70 and  51, alive and well.  One son, age 51, alive and well.   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  She does  not use tobacco or alcohol.   REVIEW OF SYSTEMS:  Noncontributory except as noted above.  Denies headache,  visual changes, chest pain, shortness of breath, abdominal pain, change in  bowel habits, unintentional weight loss, dysuria, urgency, frequency,  vaginal pruritus or discharge.  The patient denies pelvic pain.   PHYSICAL EXAMINATION:  GENERAL:  A well-developed Caucasian female.  VITAL SIGNS:  Blood pressure 122/80, heart rate 60, height 5 feet 7 inches,  weight 228.  HEENT:  Normal.  NECK:  Supple without thyromegaly, adenopathy or nodules.  CHEST:  Clear to auscultation.  BREASTS:  Symmetric without masses noted, nipple retraction or nipple  discharge.  CARDIAC:  Regular rate and rhythm without extra sounds or murmurs.  ABDOMEN:  Soft and nontender.  No hepatosplenomegaly or masses.  EXTREMITIES:  No cyanosis, clubbing or edema.  NEUROLOGIC:  Oriented x3, grossly normal.  PELVIC:  Normal  external female genitalia.  No vulvar, vaginal or cervical  lesions.  Pap smear performed on December 12, 2004, was within normal limits.  A bimanual examination reveals the uterus to be retroverted, six weeks,  mobile and nontender without any adnexal mass palpated.  RECTAL:  No mass as well.   ASSESSMENT AND PLAN:  The patient is a 50 year old para 1 Caucasian female  with amenorrhea consistent with anovulation.  She was found to have a polyp  on sonohysterogram and is admitted for [facility], hysteroscopy and polypectomy.  Risks of the procedure have been reviewed, and she expresses understanding  of and acceptance of these risks, desires to proceed with surgery.  She did  see Dr. Lajean Manes on August 07, 2005, who agreed at it is fine that she  undergo surgery.           ______________________________  Artist Pais, M.D.     DC/MEDQ  D:  08/14/2005  T:  08/14/2005  Job:  [id number]

## 2011-01-18 NOTE — Op Note (Signed)
NAMEJAMI, Courtney Parks NO.:  [id number]   MEDICAL RECORD NO.:  [id number]          PATIENT TYPE:  AMB   LOCATION:  SDC                           FACILITY:  [facility]   PHYSICIAN:  Artist Pais, M.D.    DATE OF BIRTH:  1961-05-11   DATE OF PROCEDURE:  08/14/2005  DATE OF DISCHARGE:                                 OPERATIVE REPORT   PREOPERATIVE DIAGNOSES:  1.  Endometrial polyp on sonohysterogram.  2.  Ovulatory cycles.  3.  Left vulvar/groin skin tags.   POSTOPERATIVE DIAGNOSES:  1.  Endometrial polyp on sonohystogram.  2.  Ovulatory cycles.  3.  Left vulvar/groin skin tags.  4.  Possible very small posterior submucosal fibroid adjacent to the left      tubal ostium.   OPERATION/PROCEDURE:  1.  Dilatation and curettage.  2.  Hysteroscopy.  3.  Polypectomy.  4.  Removal of left vulvar/groin skin tags.   SURGEON:  Artist Pais, M.D.   ASSISTANT:  None.   ANESTHESIA:  Monitored anesthesia care and 20 mL 1% lidocaine paracervical  block and 1 mL of 1% lidocaine subcutaneously.   SPECIMENS:  Endometrial polyp, curettings and left skin tag.   ESTIMATED BLOOD LOSS:  Minimal.   COMPLICATIONS:  None.   FLUIDS:  Approximately 800 mg of crystalloid.   DESCRIPTION OF PROCEDURE:  The patient was brought to the operating room and  placed supine on the operating room table after induction of adequate MAC  analgesia.  The patient was placed in the dorsal lithotomy position.  Great  care was taken to avoid any injury to her right foot fracture site.  She was  prepped and draped in the usual sterile fashion after induction of adequate  MAC analgesia.  The bladder was straight catheterized of approximately 100  mL of clear yellow urine.  Examination under anesthesia revealed the uterus  to be again retroverted, about eight weeks size and mobile.  The speculum  was placed and the posterior lip of the cervix was infiltrated with 1 mL of  1% lidocaine and grasped with a  single-tooth tenaculum.  The remaining 19 mL  of lidocaine were placed for paracervical block.  The cervix was very gently  dilated up to a #23 Pratt dilator.  I was unable to dilate past the #23  [facility] dilator.  Interestingly, even though her uterus tracked in a  retroverted direction, the endocervical canal was noted to track to the left  as opposed to just strictly posteriorly.  After very gentle dilatation was  performed and the uterus was sounded to approximately 6 to 6.5 cm, the  hysteroscope was placed.  Using the __________ hysteroscope with sorbitol as  the distending medium, a careful and thorough hysteroscopic examination was  performed.  The posterior polyp was identified.  It was large enough to  obscure the right tubal ostium and I was unable to visualize this.  The left  tubal ostium was easily identifiable.  The remainder of the endometrium was  noted to be normal.  There was noted to be some copious fluffy endometrium.  Subsequently the hysteroscope was withdrawn and using the serrated curets, a  systematic curettage was performed, taking great care to decrease the risk  of any uterine perforation.  The Randall-Stone forceps were placed and  additional tissue was obtained.  After good crie was heard all around, the  hysteroscope was placed and the polyp was noted to be removed in its  entirety on the posterior wall.  However, after curetting the left posterior  wall, there was noted to be a very small endometrial polyp versus fibroid  adjacent to the left cervical os which had been obscured by the very fluffy  endometrium.  I did curet over this area and using the Randall-Stone forceps  to remove it but this was very small.  I was unable even to feel it with the  Randall-Stone forceps.  The hysteroscope was again placed and this small  mass was noted to have been sampled.  It did appear to be a fibroid with  respect to its characteristics.  It was very small compared to the  polyp  which was removed. So small, in fact, that I could not even feel it with  curettage.  After curetting two additional times, I was unable to remove the  entire mass which I really think is a fibroid but I was able to biopsy it.  At that point, procedure was then terminated.  The tenaculum was removed and  there was noted to be no bleeding at the tenaculum site.  The speculum was  removed.   Subsequently the patient had requested today that we remove the left vulvar  skin tag and requested that we do so in the OR as opposed to the office.  So  this area was infiltrated with 2 mL of 1% lidocaine and removed and sent the  pathology for examination.  Excellent hemostasis was achieved using silver  nitrate sticks. She tolerated the procedure well.  The speculum was again  placed to check for bleeding once more and there was noted to be some oozing  from the left tenaculum site.  This was treated with pressure and silver  nitrate stick with bleeding resolved.  The patient tolerated the procedures  well without apparent complications and was transferred to the recovery room  in stable condition after all instrument, sponge, and needle counts were  correct.   She was given postoperative dilatation and curettage instruction sheet,  urged to use ibuprofen for pain.  Call with any problems and to make no  financial decisions. She will not return to work for at least 24 hours.  She  will return to the office in two to three weeks for followup evaluation.           ______________________________  Artist Pais, M.D.     DC/MEDQ  D:  08/14/2005  T:  08/15/2005  Job:  [id number]

## 2011-10-07 ENCOUNTER — Emergency Department (INDEPENDENT_AMBULATORY_CARE_PROVIDER_SITE_OTHER)
Admission: EM | Admit: 2011-10-07 | Discharge: 2011-10-07 | Disposition: A | Discharge status: Nursing Home (Includes ICF) | Payer: 59 | Source: Home / Self Care | Attending: Family Medicine | Admitting: Family Medicine

## 2011-10-07 ENCOUNTER — Emergency Department (HOSPITAL_COMMUNITY)
Admission: EM | Admit: 2011-10-07 | Discharge: 2011-10-07 | Disposition: A | Discharge status: Home / Self Care | Payer: 59 | Attending: Emergency Medicine | Admitting: Emergency Medicine

## 2011-10-07 ENCOUNTER — Encounter (HOSPITAL_COMMUNITY): Payer: Self-pay | Admitting: Emergency Medicine

## 2011-10-07 ENCOUNTER — Encounter (HOSPITAL_COMMUNITY): Payer: Self-pay

## 2011-10-07 ENCOUNTER — Emergency Department (HOSPITAL_COMMUNITY): Payer: 59

## 2011-10-07 DIAGNOSIS — Z794 Long term (current) use of insulin: Secondary | ICD-10-CM | POA: Insufficient documentation

## 2011-10-07 DIAGNOSIS — N390 Urinary tract infection, site not specified: Secondary | ICD-10-CM | POA: Insufficient documentation

## 2011-10-07 DIAGNOSIS — R109 Unspecified abdominal pain: Secondary | ICD-10-CM

## 2011-10-07 DIAGNOSIS — I10 Essential (primary) hypertension: Secondary | ICD-10-CM | POA: Insufficient documentation

## 2011-10-07 DIAGNOSIS — E119 Type 2 diabetes mellitus without complications: Secondary | ICD-10-CM | POA: Insufficient documentation

## 2011-10-07 DIAGNOSIS — R110 Nausea: Secondary | ICD-10-CM | POA: Insufficient documentation

## 2011-10-07 DIAGNOSIS — Z87442 Personal history of urinary calculi: Secondary | ICD-10-CM | POA: Insufficient documentation

## 2011-10-07 DIAGNOSIS — R1031 Right lower quadrant pain: Secondary | ICD-10-CM | POA: Insufficient documentation

## 2011-10-07 HISTORY — DX: Calculus of kidney: N20.0

## 2011-10-07 LAB — POCT URINALYSIS DIP (DEVICE)
Glucose, UA: 500 mg/dL — AB
Ketones, ur: 15 mg/dL — AB
Leukocytes, UA: NEGATIVE
Specific Gravity, Urine: 1.01 (ref 1.005–1.030)
Urobilinogen, UA: 0.2 mg/dL (ref 0.0–1.0)

## 2011-10-07 LAB — COMPREHENSIVE METABOLIC PANEL
ALT: 60 U/L — ABNORMAL HIGH (ref 0–35)
AST: 41 U/L — ABNORMAL HIGH (ref 0–37)
Alkaline Phosphatase: 96 U/L (ref 39–117)
CO2: 25 mEq/L (ref 19–32)
Calcium: 10.7 mg/dL — ABNORMAL HIGH (ref 8.4–10.5)
Chloride: 95 mEq/L — ABNORMAL LOW (ref 96–112)
GFR calc Af Amer: >90 mL/min (ref >90)
GFR calc non Af Amer: >90 mL/min (ref >90)
Glucose, Bld: 392 mg/dL — ABNORMAL HIGH (ref 70–99)
Sodium: 135 mEq/L (ref 135–145)
Total Bilirubin: 0.4 mg/dL (ref 0.3–1.2)

## 2011-10-07 LAB — URINE MICROSCOPIC-ADD ON

## 2011-10-07 LAB — URINALYSIS, ROUTINE W REFLEX MICROSCOPIC
Bilirubin Urine: NEGATIVE
Glucose, UA: >1000 mg/dL — AB
Hgb urine dipstick: NEGATIVE
Ketones, ur: 15 mg/dL — AB
Protein, ur: NEGATIVE mg/dL
Urobilinogen, UA: 0.2 mg/dL (ref 0.0–1.0)

## 2011-10-07 LAB — CBC
HCT: 46.1 % — ABNORMAL HIGH (ref 36.0–46.0)
MCV: 86.3 fL (ref 78.0–100.0)
Platelets: 240 10*3/uL (ref 150–400)
RBC: 5.34 MIL/uL — ABNORMAL HIGH (ref 3.87–5.11)
WBC: 10.6 10*3/uL — ABNORMAL HIGH (ref 4.0–10.5)

## 2011-10-07 LAB — DIFFERENTIAL
Eosinophils Relative: 1 % (ref 0–5)
Lymphocytes Relative: 30 % (ref 12–46)
Lymphs Abs: 3.2 10*3/uL (ref 0.7–4.0)
Neutro Abs: 6.7 10*3/uL (ref 1.7–7.7)

## 2011-10-07 LAB — GLUCOSE, CAPILLARY: Glucose-Capillary: 261 mg/dL — ABNORMAL HIGH (ref 70–99)

## 2011-10-07 MED ORDER — IOHEXOL 300 MG/ML  SOLN
100.0000 mL | Freq: Once | INTRAMUSCULAR | Status: AC | PRN
  Administered 2011-10-07: 100 mL via INTRAVENOUS

## 2011-10-07 MED ORDER — ONDANSETRON HCL 4 MG/2ML IJ SOLN
4.0000 mg | Freq: Once | INTRAMUSCULAR | Status: AC
  Administered 2011-10-07: 4 mg via INTRAVENOUS

## 2011-10-07 MED ORDER — DEXTROSE 5 % IV SOLN
1.0000 g | Freq: Once | INTRAVENOUS | Status: AC
  Administered 2011-10-07: 1 g via INTRAVENOUS
  Filled 2011-10-07: qty 10

## 2011-10-07 MED ORDER — SODIUM CHLORIDE 0.9 % IV BOLUS (SEPSIS)
1000.0000 mL | Freq: Once | INTRAVENOUS | Status: AC
  Administered 2011-10-07: 1000 mL via INTRAVENOUS

## 2011-10-07 MED ORDER — ONDANSETRON HCL 4 MG/2ML IJ SOLN
INTRAMUSCULAR | Status: AC
  Administered 2011-10-07: 4 mg via INTRAVENOUS
  Filled 2011-10-07: qty 2

## 2011-10-07 MED ORDER — CEPHALEXIN 500 MG PO CAPS: 500.0000 mg | ORAL_CAPSULE | Freq: Two times a day (BID) | ORAL | Status: AC

## 2011-10-07 NOTE — ED Notes (Signed)
C/o RLQ pain for 2 weeks intermittently.  States worse in the last 2 days.  Denies urinary sx, n/v.  States it is a burning, stinging sensation and is intermittent in severity.  States her BS was 561 this am.

## 2011-10-07 NOTE — ED Notes (Signed)
CBG is 261 

## 2011-10-07 NOTE — Discharge Instructions (Signed)
Will transfer to Emergency Department for further evaluation of abdominal pain.

## 2011-10-07 NOTE — ED Notes (Signed)
Went to our ucc and they did ua  There. Pt states at home her cbg was 561

## 2011-10-07 NOTE — ED Provider Notes (Signed)
History     CSN: [id number]  Arrival date & time 10/07/11  1408   First MD Initiated Contact with Patient 10/07/11 1741      Chief Complaint  Patient presents with  . Abdominal Pain    HPI Patient presents with right lower quadrant pain.  She notes her symptoms began insidiously 2 weeks ago.  Since onset her symptoms have been intermittent, now occurring with greater frequency and with more severity.  Pain is described as sharp/crampy.  The pain is similar to prior kidney stone episodes.  No clear alleviating or exacerbating factors, although it may be worse with particular positions.  She notes mild anorexia and mild nausea, no emesis, no new diarrhea.  No fevers, no chills. Past Medical History  Diagnosis Date  . Hypertension   . Diabetes mellitus   . Kidney stones     Past Surgical History  Procedure Date  . Ureteral stent placement   . Ulnar nerve repair     No family history on file.  History  Substance Use Topics  . Smoking status: Never Smoker   . Smokeless tobacco: Not on file  . Alcohol Use: No    OB History    Grav Para Term Preterm Abortions TAB SAB Ect Mult Living                  Review of Systems  Constitutional:       HPI  HENT:       HPI otherwise negative  Eyes: Negative.   Respiratory:       HPI, otherwise negative  Cardiovascular:       HPI, otherwise nmegative  Gastrointestinal: Negative for vomiting.  Genitourinary:       HPI, otherwise negative  Musculoskeletal:       HPI, otherwise negative  Skin: Negative.   Neurological: Negative for syncope.    Allergies  Codeine; Demerol; and Shellfish allergy  Home Medications   Current Outpatient Rx  Name Route Sig Dispense Refill  . HYDROCHLOROTHIAZIDE 25 MG PO TABS Oral Take 25 mg by mouth daily.    . INSULIN GLARGINE 100 UNIT/ML [place] SOLN Subcutaneous Inject 62 Units into the skin 2 (two) times daily.    Marland Kitchen METFORMIN HCL 1000 MG PO TABS Oral Take 1,000 mg by mouth 2 (two) times daily  with a meal.    . METOPROLOL SUCCINATE ER 100 MG PO TB24 Oral Take 100 mg by mouth daily. Take with or immediately following a meal.    . VALSARTAN 320 MG PO TABS Oral Take 320 mg by mouth daily.      BP 146/93  Pulse 119  Temp(Src) 98.9 F (37.2 C) (Oral)  Resp 16  SpO2 99%  Physical Exam  Nursing note and vitals reviewed. Constitutional: She is oriented to person, place, and time. She appears well-developed and well-nourished. No distress.  HENT:  Head: Normocephalic and atraumatic.  Eyes: Conjunctivae and EOM are normal.  Cardiovascular: Normal rate and regular rhythm.   Pulmonary/Chest: Effort normal and breath sounds normal. No stridor. No respiratory distress.  Abdominal: She exhibits no distension. There is tenderness in the right lower quadrant. There is guarding and tenderness at McBurney's point. There is no rigidity and no rebound.  Musculoskeletal: She exhibits no edema.  Neurological: She is alert and oriented to person, place, and time. No cranial nerve deficit.  Skin: Skin is warm and dry.  Psychiatric: She has a normal mood and affect.    ED Course  Procedures (including critical care time)  Labs Reviewed  CBC - Abnormal; Notable for the following:    WBC 10.6 (*)    RBC 5.34 (*)    Hemoglobin 15.4 (*)    HCT 46.1 (*)    All other components within normal limits  COMPREHENSIVE METABOLIC PANEL - Abnormal; Notable for the following:    Chloride 95 (*)    Glucose, Bld 392 (*)    Creatinine, Ser 0.32 (*)    Calcium 10.7 (*)    AST 41 (*)    ALT 60 (*)    All other components within normal limits  GLUCOSE, CAPILLARY - Abnormal; Notable for the following:    Glucose-Capillary 398 (*)    All other components within normal limits  DIFFERENTIAL  URINALYSIS, ROUTINE W REFLEX MICROSCOPIC   No results found.   No diagnosis found.    MDM  Well-appearing female presents with intermittent right-sided abdominal pain.  On exam she is in no distress with mild  discomfort in her right lower quadrant.  The patient's urinalysis is suggestive of infection.  Given her pain, her associated complaints there is concern for appendicitis.  This is not demonstrated on the CAT scan.  She was discharged in stable condition.      Gerhard Munch, MD 10/07/11 2007

## 2011-10-07 NOTE — ED Notes (Signed)
The  Pt has had rlq pain for 2 weeks worse for the past 2 days.  ucc transfer.  She has had no previous history

## 2011-10-07 NOTE — ED Notes (Signed)
Iv started  The pt does not want any pain med at present

## 2011-10-07 NOTE — ED Provider Notes (Signed)
History     CSN: [id number]  Arrival date & time 10/07/11  1202   First MD Initiated Contact with Patient 10/07/11 1328      Chief Complaint  Patient presents with  . Abdominal Pain    (Consider location/radiation/quality/duration/timing/severity/associated sxs/prior treatment) HPI Comments: Courtney Parks presents for evaluation of abdominal pain has been worsening over the last 2 days. She reports onset of symptoms 2 weeks ago. It was intermittent at that time. She now reports severe right lower quadrant pain that has worsened over the last 48 hours. She denies any nausea or vomiting and no change in bowel habits. She does continue to tolerate by mouth well. She denies any fever or urinary symptoms.  Patient is a 51 y.o. female presenting with abdominal pain.  Abdominal Pain The primary symptoms of the illness include abdominal pain. The primary symptoms of the illness do not include nausea, vomiting or diarrhea. The onset of the illness was sudden. The problem has been gradually worsening.  The abdominal pain began more than 2 days ago. The pain came on suddenly. The abdominal pain has been unchanged since its onset. The abdominal pain is located in the RLQ. The abdominal pain radiates to the back and right leg. The abdominal pain is relieved by nothing. The abdominal pain is exacerbated by certain positions.  Significant associated medical issues include diabetes.    Past Medical History  Diagnosis Date  . Hypertension   . Diabetes mellitus   . Kidney stones     Past Surgical History  Procedure Date  . Ureteral stent placement   . Ulnar nerve repair     No family history on file.  History  Substance Use Topics  . Smoking status: Never Smoker   . Smokeless tobacco: Not on file  . Alcohol Use: No    OB History    Grav Para Term Preterm Abortions TAB SAB Ect Mult Living                  Review of Systems  Constitutional: Negative.   HENT: Negative.   Eyes: Negative.     Respiratory: Negative.   Cardiovascular: Negative.   Gastrointestinal: Positive for abdominal pain. Negative for nausea, vomiting and diarrhea.  Genitourinary: Negative.   Musculoskeletal: Negative.   Skin: Negative.   Neurological: Negative.     Allergies  Codeine; Demerol; and Shellfish allergy  Home Medications   Current Outpatient Rx  Name Route Sig Dispense Refill  . HYDROCHLOROTHIAZIDE 25 MG PO TABS Oral Take 25 mg by mouth daily.    . INSULIN GLARGINE 100 UNIT/ML [place] SOLN Subcutaneous Inject 62 Units into the skin 2 (two) times daily.    Marland Kitchen METFORMIN HCL 1000 MG PO TABS Oral Take 1,000 mg by mouth 2 (two) times daily with a meal.    . METOPROLOL SUCCINATE ER 100 MG PO TB24 Oral Take 100 mg by mouth daily. Take with or immediately following a meal.    . VALSARTAN 320 MG PO TABS Oral Take 320 mg by mouth daily.      BP 152/93  Pulse 135  Temp(Src) 99.2 F (37.3 C) (Oral)  Resp 18  SpO2 97%  Physical Exam  Nursing note and vitals reviewed. Constitutional: She is oriented to person, place, and time. She appears well-developed and well-nourished.  HENT:  Head: Normocephalic and atraumatic.  Eyes: EOM are normal.  Neck: Normal range of motion.  Pulmonary/Chest: Effort normal.  Abdominal: Soft. Normal appearance and bowel sounds are normal.  There is tenderness in the right lower quadrant. There is rebound and tenderness at McBurney's point.  Musculoskeletal: Normal range of motion.  Neurological: She is alert and oriented to person, place, and time.  Skin: Skin is warm and dry.  Psychiatric: Her behavior is normal.    ED Course  Procedures (including critical care time)  Labs Reviewed  POCT URINALYSIS DIP (DEVICE) - Abnormal; Notable for the following:    Glucose, UA 500 (*)    Ketones, ur 15 (*)    All other components within normal limits   No results found.   1. Abdominal pain       MDM  Transferred to the Emergency Department for rule out  appendicitis        Richardo Priest, MD 10/07/11 1409

## 2011-10-07 NOTE — ED Notes (Signed)
abd pain rlq x 2 weeks off and on worse the l;ast 2 days

## 2011-10-07 NOTE — ED Notes (Signed)
The pt returned from c-t 

## 2011-10-07 NOTE — ED Notes (Signed)
The pt just went to c-t 

## 2011-12-10 ENCOUNTER — Other Ambulatory Visit: Payer: 59 | Admitting: Internal Medicine

## 2011-12-10 ENCOUNTER — Encounter: Payer: Self-pay | Admitting: Internal Medicine

## 2011-12-10 DIAGNOSIS — Z0000 Encounter for general adult medical examination without abnormal findings: Secondary | ICD-10-CM

## 2011-12-10 LAB — COMPREHENSIVE METABOLIC PANEL
AST: 43 U/L — ABNORMAL HIGH (ref 0–37)
Albumin: 4.3 g/dL (ref 3.5–5.2)
Alkaline Phosphatase: 81 U/L (ref 39–117)
BUN: 17 mg/dL (ref 6–23)
Calcium: 9.6 mg/dL (ref 8.4–10.5)
Chloride: 100 mEq/L (ref 96–112)
Glucose, Bld: 301 mg/dL — ABNORMAL HIGH (ref 70–99)
Potassium: 4.1 mEq/L (ref 3.5–5.3)
Sodium: 134 mEq/L — ABNORMAL LOW (ref 135–145)
Total Protein: 6.5 g/dL (ref 6.0–8.3)

## 2011-12-10 LAB — CBC WITH DIFFERENTIAL/PLATELET
Basophils Absolute: 0 10*3/uL (ref 0.0–0.1)
Basophils Relative: 0 % (ref 0–1)
HCT: 46 % (ref 36.0–46.0)
Hemoglobin: 14.5 g/dL (ref 12.0–15.0)
Lymphocytes Relative: 41 % (ref 12–46)
Monocytes Absolute: 0.5 10*3/uL (ref 0.1–1.0)
Monocytes Relative: 6 % (ref 3–12)
Neutro Abs: 4.1 10*3/uL (ref 1.7–7.7)
Neutrophils Relative %: 51 % (ref 43–77)
RBC: 5.05 MIL/uL (ref 3.87–5.11)
WBC: 8 10*3/uL (ref 4.0–10.5)

## 2011-12-10 LAB — LIPID PANEL
LDL Cholesterol: 131 mg/dL — ABNORMAL HIGH (ref 0–99)
Triglycerides: 324 mg/dL — ABNORMAL HIGH (ref <150)
VLDL: 65 mg/dL — ABNORMAL HIGH (ref 0–40)

## 2011-12-11 LAB — HEMOGLOBIN A1C
Hgb A1c MFr Bld: 11.3 % — ABNORMAL HIGH (ref <5.7)
Mean Plasma Glucose: 278 mg/dL — ABNORMAL HIGH (ref <117)

## 2011-12-12 ENCOUNTER — Encounter: Payer: Self-pay | Admitting: Internal Medicine

## 2011-12-12 ENCOUNTER — Ambulatory Visit (INDEPENDENT_AMBULATORY_CARE_PROVIDER_SITE_OTHER): Payer: 59 | Admitting: Internal Medicine

## 2011-12-12 ENCOUNTER — Other Ambulatory Visit (HOSPITAL_COMMUNITY)
Admission: RE | Admit: 2011-12-12 | Discharge: 2011-12-12 | Disposition: A | Discharge status: Home / Self Care | Payer: 59 | Source: Ambulatory Visit | Attending: Internal Medicine | Admitting: Internal Medicine

## 2011-12-12 VITALS — BP 138/78 | HR 100 | Ht 66.0 in | Wt 240.0 lb

## 2011-12-12 DIAGNOSIS — Z0000 Encounter for general adult medical examination without abnormal findings: Secondary | ICD-10-CM

## 2011-12-12 DIAGNOSIS — Z87442 Personal history of urinary calculi: Secondary | ICD-10-CM

## 2011-12-12 DIAGNOSIS — E119 Type 2 diabetes mellitus without complications: Secondary | ICD-10-CM

## 2011-12-12 DIAGNOSIS — Z01419 Encounter for gynecological examination (general) (routine) without abnormal findings: Secondary | ICD-10-CM | POA: Insufficient documentation

## 2011-12-12 DIAGNOSIS — I10 Essential (primary) hypertension: Secondary | ICD-10-CM

## 2011-12-12 DIAGNOSIS — E785 Hyperlipidemia, unspecified: Secondary | ICD-10-CM

## 2011-12-12 DIAGNOSIS — Z124 Encounter for screening for malignant neoplasm of cervix: Secondary | ICD-10-CM

## 2011-12-12 LAB — POCT URINALYSIS DIPSTICK
Blood, UA: NEGATIVE
Leukocytes, UA: NEGATIVE
Nitrite, UA: NEGATIVE
Protein, UA: NEGATIVE
Urobilinogen, UA: NEGATIVE
pH, UA: 6

## 2011-12-31 NOTE — Progress Notes (Signed)
  Subjective:    Patient ID: Courtney Parks, female    DOB: 06/20/1961, 51 y.o.   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 Dr. Debara Pickett is her endocrinologist. She has a history of insulin-dependent diabetes mellitus. She takes metformin. She also takes Lantus insulin 62 units twice daily. History of renal stent placement in 2012 for kidney stone. Had a urinary tract infection February 2011. History of hypertension treated with Toprol ER 100 mg daily, HCTZ 25 mg daily and Diovan 320 mg daily. Had tetanus immunization February 2012.  Patient is allergic to shellfish -- causes swelling. Patient has had one pregnancy and no miscarriages. Lives with her son and is divorced.  Family history: Father with history of hypertension age 19 living, mother age 30 with history of diabetes , cancer and hypertension. One brother with hypertension and sleep apnea. One sister with history of peptic ulcer disease and obesity.  Patient has history of hyperlipidemia but has not wanted to take statin medication. History of depression in the past. History of GE reflux. History of abnormal Pap smear assessed with colposcopy. Has tried WelChol in the past for hyperlipidemia. History of migraine headache.  Old records indicate she had a normal cardiac catheterization in 2005, normal gallbladder ultrasound 2001. Does not smoke or consume alcohol.    Review of Systems  Constitutional: Negative.   HENT: Negative.   Eyes: Negative.   Respiratory: Negative.   Cardiovascular: Negative.   Gastrointestinal: Negative.   Genitourinary: Negative.   Musculoskeletal: Negative.   Neurological: Negative.   Hematological: Negative.   Psychiatric/Behavioral: Negative.        Objective:   Physical Exam  Vitals reviewed. Constitutional: She is oriented to person, place, and time. She appears well-developed and well-nourished. No  distress.  HENT:  Head: Normocephalic and atraumatic.  Right Ear: External ear normal.  Left Ear: External ear normal.  Mouth/Throat: Oropharynx is clear and moist. No oropharyngeal exudate.  Eyes: Conjunctivae and EOM are normal. Pupils are equal, round, and reactive to light. Right eye exhibits no discharge. Left eye exhibits no discharge. No scleral icterus.  Neck: No JVD present. No thyromegaly present.  Cardiovascular: Normal rate, regular rhythm, normal heart sounds and intact distal pulses.   No murmur heard. Pulmonary/Chest: Effort normal and breath sounds normal. No respiratory distress. She has no wheezes. She has no rales.       Breasts normal female  Abdominal: She exhibits no distension and no mass. There is no tenderness. There is no rebound and no guarding.  Genitourinary: Vagina normal and uterus normal.  Musculoskeletal: Normal range of motion. She exhibits no edema.  Lymphadenopathy:    She has no cervical adenopathy.  Neurological: She is alert and oriented to person, place, and time. She has normal reflexes. No cranial nerve deficit. Coordination normal.  Skin: Skin is warm and dry. No rash noted. She is not diaphoretic.  Psychiatric: She has a normal mood and affect. Her behavior is normal. Judgment and thought content normal.          Assessment & Plan:  Insulin-dependent diabetes mellitus  Hypertension  History of kidney stones  Hyperlipidemia  Plan: Patient's diabetes we've managed by Dr. Sharl Ma. I have persuaded her to try Crestor 5 mg daily for 3 months and return and then have fasting lipid panel liver functions with office visit at that time.

## 2012-01-01 ENCOUNTER — Encounter: Payer: Self-pay | Admitting: Internal Medicine

## 2012-01-01 DIAGNOSIS — I10 Essential (primary) hypertension: Secondary | ICD-10-CM | POA: Insufficient documentation

## 2012-01-01 DIAGNOSIS — Z87442 Personal history of urinary calculi: Secondary | ICD-10-CM | POA: Insufficient documentation

## 2012-01-01 DIAGNOSIS — E11628 Type 2 diabetes mellitus with other skin complications: Secondary | ICD-10-CM | POA: Insufficient documentation

## 2012-01-01 DIAGNOSIS — L089 Local infection of the skin and subcutaneous tissue, unspecified: Secondary | ICD-10-CM | POA: Insufficient documentation

## 2012-01-01 DIAGNOSIS — E785 Hyperlipidemia, unspecified: Secondary | ICD-10-CM | POA: Insufficient documentation

## 2012-01-01 NOTE — Patient Instructions (Signed)
Try Crestor 5 mg daily for 3 months and return for fasting lipid panel liver functions and office visit

## 2012-02-07 ENCOUNTER — Emergency Department (HOSPITAL_COMMUNITY)
Admission: EM | Admit: 2012-02-07 | Discharge: 2012-02-07 | Discharge status: Against Medical Advice | Payer: 59 | Attending: Emergency Medicine | Admitting: Emergency Medicine

## 2012-02-07 ENCOUNTER — Encounter (HOSPITAL_COMMUNITY): Payer: Self-pay | Admitting: Emergency Medicine

## 2012-02-07 DIAGNOSIS — R1110 Vomiting, unspecified: Secondary | ICD-10-CM | POA: Insufficient documentation

## 2012-02-07 DIAGNOSIS — R109 Unspecified abdominal pain: Secondary | ICD-10-CM | POA: Insufficient documentation

## 2012-02-07 LAB — URINALYSIS, ROUTINE W REFLEX MICROSCOPIC
Bilirubin Urine: NEGATIVE
Hgb urine dipstick: NEGATIVE
Nitrite: POSITIVE — AB
Protein, ur: NEGATIVE mg/dL
Urobilinogen, UA: 0.2 mg/dL (ref 0.0–1.0)

## 2012-02-07 LAB — COMPREHENSIVE METABOLIC PANEL
Albumin: 4 g/dL (ref 3.5–5.2)
Alkaline Phosphatase: 86 U/L (ref 39–117)
BUN: 8 mg/dL (ref 6–23)
Creatinine, Ser: 0.37 mg/dL — ABNORMAL LOW (ref 0.50–1.10)
GFR calc Af Amer: >90 mL/min (ref >90)
Glucose, Bld: 178 mg/dL — ABNORMAL HIGH (ref 70–99)
Total Protein: 7 g/dL (ref 6.0–8.3)

## 2012-02-07 LAB — CBC
HCT: 45 % (ref 36.0–46.0)
Hemoglobin: 15 g/dL (ref 12.0–15.0)
MCH: 28.7 pg (ref 26.0–34.0)
MCHC: 33.3 g/dL (ref 30.0–36.0)
MCV: 86 fL (ref 78.0–100.0)
RBC: 5.23 MIL/uL — ABNORMAL HIGH (ref 3.87–5.11)

## 2012-02-07 LAB — DIFFERENTIAL
Basophils Relative: 0 % (ref 0–1)
Eosinophils Absolute: 0.1 10*3/uL (ref 0.0–0.7)
Eosinophils Relative: 1 % (ref 0–5)
Lymphs Abs: 2.9 10*3/uL (ref 0.7–4.0)
Monocytes Absolute: 0.5 10*3/uL (ref 0.1–1.0)
Monocytes Relative: 6 % (ref 3–12)
Neutrophils Relative %: 61 % (ref 43–77)

## 2012-02-07 LAB — URINE MICROSCOPIC-ADD ON

## 2012-02-07 LAB — GLUCOSE, CAPILLARY: Glucose-Capillary: 182 mg/dL — ABNORMAL HIGH (ref 70–99)

## 2012-02-07 NOTE — ED Notes (Signed)
Pt has R sided pain intermittently in past and has had n/v and severe flank pain today. States she leaned over and vomited and felt like something burst. States that now it feels better than it did before. Still nauseas. Afebrile. States her BS has been labile as well.

## 2012-02-07 NOTE — ED Notes (Addendum)
Consulted with Dr. Effie Shy on pt's condition. Stated it was safe to not rush pt back at this time and to get a urine sample. Pt in NAD at this time.

## 2012-02-13 ENCOUNTER — Encounter: Payer: Self-pay | Admitting: Internal Medicine

## 2012-02-13 ENCOUNTER — Ambulatory Visit (INDEPENDENT_AMBULATORY_CARE_PROVIDER_SITE_OTHER): Payer: 59 | Admitting: Internal Medicine

## 2012-02-13 ENCOUNTER — Ambulatory Visit
Admission: RE | Admit: 2012-02-13 | Discharge: 2012-02-13 | Disposition: A | Discharge status: Home / Self Care | Payer: 59 | Source: Ambulatory Visit | Attending: Internal Medicine | Admitting: Internal Medicine

## 2012-02-13 VITALS — BP 122/80 | HR 96 | Temp 98.4°F | Wt 237.0 lb

## 2012-02-13 DIAGNOSIS — T1490XA Injury, unspecified, initial encounter: Secondary | ICD-10-CM

## 2012-02-13 DIAGNOSIS — E119 Type 2 diabetes mellitus without complications: Secondary | ICD-10-CM

## 2012-02-13 DIAGNOSIS — N390 Urinary tract infection, site not specified: Secondary | ICD-10-CM

## 2012-02-13 DIAGNOSIS — R82998 Other abnormal findings in urine: Secondary | ICD-10-CM

## 2012-02-13 LAB — POCT URINALYSIS DIPSTICK
Bilirubin, UA: NEGATIVE
Nitrite, UA: POSITIVE
Spec Grav, UA: 1.01
Urobilinogen, UA: NEGATIVE
pH, UA: 5.5

## 2012-02-14 ENCOUNTER — Encounter: Payer: Self-pay | Admitting: Internal Medicine

## 2012-02-14 ENCOUNTER — Other Ambulatory Visit: Payer: 59 | Admitting: Internal Medicine

## 2012-02-14 NOTE — Patient Instructions (Addendum)
Take Cipro for urinary tract infection. Try to keep diabetes under good control. In July we will check fasting lipid panel and hemoglobin A1c as well as repeat urinalysis in followup for treatment of urinary tract infection. He may take left second toe to adjacent if needed for stability or pain. Otherwise leave it alone and it should heal fine. No evidence of osteomyelitis on x-ray.

## 2012-02-14 NOTE — Progress Notes (Signed)
  Subjective:    Patient ID: Courtney Parks, female    DOB: March 07, 1961, 51 y.o.   MRN: [id number]  HPI 51 year old white female went to the emergency department recently with symptoms consistent of kidney stone. Her urinalysis was abnormal. She left without being seen after waiting 3-1/2 hours. Subsequently passed a stone. However she is here today regarding her left second toe. About a month ago she jammed it and it has remained oriented in your dictated. Pain has improved considerably. She is worried about osteomyelitis that she has a history of diabetes. She saw an endocrinologist recently Dr. Sharl Ma who placed her on Lantus 65 units at bedtime and sliding scale Humalog with 20 units in her largest meal. Her last hemoglobin A1c with Dr. Sharl Ma was 11.8%. He did not check a fasting lipid panel and she's scheduled to do that here in July which we will keep that appointment. No dysuria. She was brought to find out that her urine in the emergency department looked more like an infection and kidney stone. There was no hematuria. No fever or shaking chills.   Review of Systems     Objective:   Physical Exam no CVA tenderness; chest is clear to auscultation; cardiac exam regular rate and rhythm; neck is supple without thyromegaly; extremities no lower strep he edema. Diabetic foot exam is negative. Have her left second toe distal phalanx PIP joint is erythematous. Nail is distorted. X-ray reveals fracture of distal phalanx nondisplaced. No pain to palpation.        Assessment & Plan:  Nondisplaced fracture distal phalanx second toe left foot-see below  Insulin-dependent diabetes  History of kidney stone  Urinary tract infection-see below  Plan: Patient may apply ice to toe and may tape it to the adjacent toe if needed for pain control. Seems to be mostly pain-free at present time. Return in July for fasting lipid panel and followup with hemoglobin A1c. Cipro 500 mg by mouth twice daily for urinary  tract infection for 10 days. Recheck urinalysis when patient returns in July. Culture has been done.

## 2012-02-16 LAB — URINE CULTURE: Colony Count: >=100000

## 2012-02-25 ENCOUNTER — Encounter (HOSPITAL_BASED_OUTPATIENT_CLINIC_OR_DEPARTMENT_OTHER): Payer: Self-pay | Admitting: Emergency Medicine

## 2012-02-25 ENCOUNTER — Emergency Department (HOSPITAL_BASED_OUTPATIENT_CLINIC_OR_DEPARTMENT_OTHER): Payer: 59

## 2012-02-25 ENCOUNTER — Inpatient Hospital Stay (HOSPITAL_BASED_OUTPATIENT_CLINIC_OR_DEPARTMENT_OTHER)
Admission: EM | Admit: 2012-02-25 | Discharge: 2012-02-29 | DRG: 419 | Disposition: A | Discharge status: Home / Self Care | Payer: 59 | Attending: Internal Medicine | Admitting: Internal Medicine

## 2012-02-25 ENCOUNTER — Inpatient Hospital Stay (HOSPITAL_COMMUNITY): Payer: 59

## 2012-02-25 DIAGNOSIS — E119 Type 2 diabetes mellitus without complications: Secondary | ICD-10-CM | POA: Diagnosis present

## 2012-02-25 DIAGNOSIS — R945 Abnormal results of liver function studies: Secondary | ICD-10-CM

## 2012-02-25 DIAGNOSIS — Z8639 Personal history of other endocrine, nutritional and metabolic disease: Secondary | ICD-10-CM

## 2012-02-25 DIAGNOSIS — R932 Abnormal findings on diagnostic imaging of liver and biliary tract: Secondary | ICD-10-CM

## 2012-02-25 DIAGNOSIS — E785 Hyperlipidemia, unspecified: Secondary | ICD-10-CM

## 2012-02-25 DIAGNOSIS — Z794 Long term (current) use of insulin: Secondary | ICD-10-CM

## 2012-02-25 DIAGNOSIS — E11628 Type 2 diabetes mellitus with other skin complications: Secondary | ICD-10-CM | POA: Diagnosis present

## 2012-02-25 DIAGNOSIS — Z6837 Body mass index (BMI) 37.0-37.9, adult: Secondary | ICD-10-CM

## 2012-02-25 DIAGNOSIS — R748 Abnormal levels of other serum enzymes: Secondary | ICD-10-CM

## 2012-02-25 DIAGNOSIS — K810 Acute cholecystitis: Secondary | ICD-10-CM | POA: Diagnosis present

## 2012-02-25 DIAGNOSIS — L089 Local infection of the skin and subcutaneous tissue, unspecified: Secondary | ICD-10-CM | POA: Diagnosis present

## 2012-02-25 DIAGNOSIS — R509 Fever, unspecified: Secondary | ICD-10-CM

## 2012-02-25 DIAGNOSIS — K7689 Other specified diseases of liver: Secondary | ICD-10-CM | POA: Diagnosis present

## 2012-02-25 DIAGNOSIS — Z860100 Personal history of colon polyps, unspecified: Secondary | ICD-10-CM

## 2012-02-25 DIAGNOSIS — R739 Hyperglycemia, unspecified: Secondary | ICD-10-CM

## 2012-02-25 DIAGNOSIS — E669 Obesity, unspecified: Secondary | ICD-10-CM | POA: Diagnosis present

## 2012-02-25 DIAGNOSIS — Z87891 Personal history of nicotine dependence: Secondary | ICD-10-CM

## 2012-02-25 DIAGNOSIS — Z885 Allergy status to narcotic agent status: Secondary | ICD-10-CM

## 2012-02-25 DIAGNOSIS — R109 Unspecified abdominal pain: Secondary | ICD-10-CM

## 2012-02-25 DIAGNOSIS — Z87440 Personal history of urinary (tract) infections: Secondary | ICD-10-CM

## 2012-02-25 DIAGNOSIS — K812 Acute cholecystitis with chronic cholecystitis: Principal | ICD-10-CM | POA: Diagnosis present

## 2012-02-25 DIAGNOSIS — R7989 Other specified abnormal findings of blood chemistry: Secondary | ICD-10-CM

## 2012-02-25 DIAGNOSIS — I10 Essential (primary) hypertension: Secondary | ICD-10-CM | POA: Diagnosis present

## 2012-02-25 DIAGNOSIS — Z87442 Personal history of urinary calculi: Secondary | ICD-10-CM

## 2012-02-25 DIAGNOSIS — K7581 Nonalcoholic steatohepatitis (NASH): Secondary | ICD-10-CM | POA: Diagnosis present

## 2012-02-25 LAB — CBC
Hemoglobin: 14.9 g/dL (ref 12.0–15.0)
Platelets: 157 10*3/uL (ref 150–400)
RBC: 5.06 MIL/uL (ref 3.87–5.11)
WBC: 5.3 10*3/uL (ref 4.0–10.5)

## 2012-02-25 LAB — COMPREHENSIVE METABOLIC PANEL
ALT: 319 U/L — ABNORMAL HIGH (ref 0–35)
Alkaline Phosphatase: 139 U/L — ABNORMAL HIGH (ref 39–117)
BUN: 9 mg/dL (ref 6–23)
CO2: 23 mEq/L (ref 19–32)
Chloride: 90 mEq/L — ABNORMAL LOW (ref 96–112)
GFR calc Af Amer: >90 mL/min (ref >90)
GFR calc non Af Amer: >90 mL/min (ref >90)
Glucose, Bld: 433 mg/dL — ABNORMAL HIGH (ref 70–99)
Potassium: 3.1 mEq/L — ABNORMAL LOW (ref 3.5–5.1)
Sodium: 132 mEq/L — ABNORMAL LOW (ref 135–145)
Total Bilirubin: 0.4 mg/dL (ref 0.3–1.2)
Total Protein: 7.3 g/dL (ref 6.0–8.3)

## 2012-02-25 LAB — POCT I-STAT 3, VENOUS BLOOD GAS (G3P V)
Acid-Base Excess: 3 mmol/L — ABNORMAL HIGH (ref 0.0–2.0)
Bicarbonate: 27.8 mEq/L — ABNORMAL HIGH (ref 20.0–24.0)
O2 Saturation: 82 %
Patient temperature: 98.4
pO2, Ven: 46 mmHg — ABNORMAL HIGH (ref 30.0–45.0)

## 2012-02-25 LAB — URINALYSIS, ROUTINE W REFLEX MICROSCOPIC
Nitrite: NEGATIVE
Specific Gravity, Urine: 1.017 (ref 1.005–1.030)
Urobilinogen, UA: 0.2 mg/dL (ref 0.0–1.0)
pH: 6.5 (ref 5.0–8.0)

## 2012-02-25 LAB — GLUCOSE, CAPILLARY: Glucose-Capillary: 326 mg/dL — ABNORMAL HIGH (ref 70–99)

## 2012-02-25 LAB — HEMOGLOBIN A1C: Hgb A1c MFr Bld: 11.7 % — ABNORMAL HIGH (ref <5.7)

## 2012-02-25 LAB — DIFFERENTIAL
Lymphocytes Relative: 24 % (ref 12–46)
Lymphs Abs: 1.3 10*3/uL (ref 0.7–4.0)
Monocytes Relative: 10 % (ref 3–12)
Neutro Abs: 3.3 10*3/uL (ref 1.7–7.7)
Neutrophils Relative %: 62 % (ref 43–77)

## 2012-02-25 LAB — LIPASE, BLOOD: Lipase: 65 U/L — ABNORMAL HIGH (ref 11–59)

## 2012-02-25 LAB — PROTIME-INR: INR: 1.03 (ref 0.00–1.49)

## 2012-02-25 LAB — APTT: aPTT: 36 seconds (ref 24–37)

## 2012-02-25 LAB — URINE MICROSCOPIC-ADD ON

## 2012-02-25 MED ORDER — PIPERACILLIN-TAZOBACTAM 3.375 G IVPB
3.3750 g | Freq: Three times a day (TID) | INTRAVENOUS | Status: DC
  Administered 2012-02-25: 3.375 g via INTRAVENOUS
  Filled 2012-02-25 (×3): qty 50

## 2012-02-25 MED ORDER — SODIUM CHLORIDE 0.9 % IV BOLUS (SEPSIS)
1000.0000 mL | Freq: Once | INTRAVENOUS | Status: AC
  Administered 2012-02-25: 1000 mL via INTRAVENOUS

## 2012-02-25 MED ORDER — ACETAMINOPHEN 325 MG PO TABS
650.0000 mg | ORAL_TABLET | Freq: Once | ORAL | Status: AC
  Administered 2012-02-25: 650 mg via ORAL
  Filled 2012-02-25: qty 2

## 2012-02-25 MED ORDER — METOPROLOL SUCCINATE ER 100 MG PO TB24
100.0000 mg | ORAL_TABLET | Freq: Every day | ORAL | Status: DC
  Administered 2012-02-25 – 2012-02-29 (×4): 100 mg via ORAL
  Filled 2012-02-25 (×6): qty 1

## 2012-02-25 MED ORDER — IBUPROFEN 400 MG PO TABS
400.0000 mg | ORAL_TABLET | Freq: Four times a day (QID) | ORAL | Status: DC | PRN
  Administered 2012-02-25 – 2012-02-27 (×4): 400 mg via ORAL
  Filled 2012-02-25 (×4): qty 1

## 2012-02-25 MED ORDER — INSULIN ASPART 100 UNIT/ML ~~LOC~~ SOLN
0.0000 [IU] | Freq: Three times a day (TID) | SUBCUTANEOUS | Status: DC
  Administered 2012-02-25: 8 [IU] via SUBCUTANEOUS
  Administered 2012-02-25: 5 [IU] via SUBCUTANEOUS
  Administered 2012-02-25: 8 [IU] via SUBCUTANEOUS
  Administered 2012-02-26: 5 [IU] via SUBCUTANEOUS
  Administered 2012-02-26: 8 [IU] via SUBCUTANEOUS
  Administered 2012-02-27 – 2012-02-29 (×3): 5 [IU] via SUBCUTANEOUS

## 2012-02-25 MED ORDER — SODIUM CHLORIDE 0.9 % IV SOLN
INTRAVENOUS | Status: AC
  Administered 2012-02-25 – 2012-02-26 (×3): via INTRAVENOUS
  Administered 2012-02-26: 10 mL/h via INTRAVENOUS

## 2012-02-25 MED ORDER — LUTEIN 10 MG PO TABS: 1.0000 | ORAL_TABLET | Freq: Every day | ORAL | Status: DC

## 2012-02-25 MED ORDER — INSULIN GLARGINE 100 UNIT/ML ~~LOC~~ SOLN
65.0000 [IU] | Freq: Every day | SUBCUTANEOUS | Status: DC
  Administered 2012-02-25: 65 [IU] via SUBCUTANEOUS

## 2012-02-25 MED ORDER — IOHEXOL 300 MG/ML  SOLN
100.0000 mL | Freq: Once | INTRAMUSCULAR | Status: AC | PRN
  Administered 2012-02-25: 100 mL via INTRAVENOUS

## 2012-02-25 MED ORDER — INSULIN ASPART 100 UNIT/ML ~~LOC~~ SOLN
0.0000 [IU] | Freq: Every day | SUBCUTANEOUS | Status: DC
  Administered 2012-02-25: 3 [IU] via SUBCUTANEOUS
  Administered 2012-02-26: 5 [IU] via SUBCUTANEOUS
  Administered 2012-02-28: 2 [IU] via SUBCUTANEOUS

## 2012-02-25 MED ORDER — IRBESARTAN 300 MG PO TABS
300.0000 mg | ORAL_TABLET | Freq: Every day | ORAL | Status: DC
  Administered 2012-02-25 – 2012-02-27 (×2): 300 mg via ORAL
  Filled 2012-02-25 (×4): qty 1

## 2012-02-25 MED ORDER — POTASSIUM CHLORIDE 10 MEQ/100ML IV SOLN
10.0000 meq | Freq: Once | INTRAVENOUS | Status: AC
  Administered 2012-02-25: 10 meq via INTRAVENOUS
  Filled 2012-02-25: qty 100

## 2012-02-25 MED ORDER — SODIUM CHLORIDE 0.9 % IV SOLN: INTRAVENOUS | Status: DC

## 2012-02-25 NOTE — H&P (Signed)
Courtney Parks is an 51 y.o. female.   Chief Complaint: fever HPI: 51 yo female with hypertension, Dm2, nephrolithiaisis, recent uti,  Apparently had temp to 102 last nite and therefore presented to the ED,  Pt also noted right sided/flank pain, and nausea, but no emesis.  Also notes slight cough with clear sputum. Denies cp, palp, sob, diarrhea, constipation, brbpr, black stool.     In ED,  U/a negative,  Liver function abnormal,  CT scan and CXR unrevealing as to the source of the fever.  Last took advil at 10pm. Yesterday evening.    Past Medical History  Diagnosis Date  . Hypertension   . Diabetes mellitus   . Kidney stones   . Kidney stones     Past Surgical History  Procedure Date  . Ureteral stent placement   . Ulnar nerve repair     History reviewed. No pertinent family history. Social History:  reports that she quit smoking about 28 years ago. Her smoking use included Cigarettes. She quit after 5 years of use. She does not have any smokeless tobacco history on file. She reports that she does not drink alcohol or use illicit drugs.  Allergies:  Allergies  Allergen Reactions  . Codeine     REACTION: Nausea  . Demerol Nausea And Vomiting  . Shellfish Allergy Swelling    Medications Prior to Admission  Medication Sig Dispense Refill  . ibuprofen (ADVIL,MOTRIN) 200 MG tablet Take 400 mg by mouth as needed.      Marland Kitchen CINNAMON PO Take 1 capsule by mouth daily.      . hydrochlorothiazide (HYDRODIURIL) 25 MG tablet Take 25 mg by mouth daily.      . insulin glargine (LANTUS) 100 UNIT/ML injection Inject 65 Units into the skin at bedtime.       . insulin lispro (HUMALOG) 100 UNIT/ML injection Inject 20 Units into the skin 3 (three) times daily before meals.      Marland Kitchen KRILL OIL PO Take 1 capsule by mouth daily.      . Lutein 10 MG TABS Take 1 tablet by mouth daily.      . metFORMIN (GLUCOPHAGE) 1000 MG tablet Take 1,000 mg by mouth 2 (two) times daily with a meal.      . metoprolol  succinate (TOPROL-XL) 100 MG 24 hr tablet Take 100 mg by mouth daily. Take with or immediately following a meal.      . valsartan (DIOVAN) 320 MG tablet Take 320 mg by mouth daily.        Results for orders placed during the hospital encounter of 02/25/12 (from the past 48 hour(s))  URINALYSIS, ROUTINE W REFLEX MICROSCOPIC     Status: Abnormal   Collection Time   02/25/12  1:27 AM      Component Value Range Comment   Color, Urine YELLOW  YELLOW    APPearance CLEAR  CLEAR    Specific Gravity, Urine 1.017  1.005 - 1.030    pH 6.5  5.0 - 8.0    Glucose, UA >1000 (*) NEGATIVE mg/dL    Hgb urine dipstick NEGATIVE  NEGATIVE    Bilirubin Urine NEGATIVE  NEGATIVE    Ketones, ur 15 (*) NEGATIVE mg/dL    Protein, ur NEGATIVE  NEGATIVE mg/dL    Urobilinogen, UA 0.2  0.0 - 1.0 mg/dL    Nitrite NEGATIVE  NEGATIVE    Leukocytes, UA NEGATIVE  NEGATIVE   URINE MICROSCOPIC-ADD ON     Status: Normal  Collection Time   02/25/12  1:27 AM      Component Value Range Comment   Squamous Epithelial / LPF RARE  RARE    WBC, UA 0-2  <3 WBC/hpf    RBC / HPF 0-2  <3 RBC/hpf    Bacteria, UA RARE  RARE   CBC     Status: Normal   Collection Time   02/25/12  2:01 AM      Component Value Range Comment   WBC 5.3  4.0 - 10.5 K/uL    RBC 5.06  3.87 - 5.11 MIL/uL    Hemoglobin 14.9  12.0 - 15.0 g/dL    HCT 96.2  95.2 - 84.1 %    MCV 84.6  78.0 - 100.0 fL    MCH 29.4  26.0 - 34.0 pg    MCHC 34.8  30.0 - 36.0 g/dL    RDW 32.4  40.1 - 02.7 %    Platelets 157  150 - 400 K/uL   DIFFERENTIAL     Status: Normal   Collection Time   02/25/12  2:01 AM      Component Value Range Comment   Neutrophils Relative 62  43 - 77 %    Neutro Abs 3.3  1.7 - 7.7 K/uL    Lymphocytes Relative 24  12 - 46 %    Lymphs Abs 1.3  0.7 - 4.0 K/uL    Monocytes Relative 10  3 - 12 %    Monocytes Absolute 0.5  0.1 - 1.0 K/uL    Eosinophils Relative 4  0 - 5 %    Eosinophils Absolute 0.2  0.0 - 0.7 K/uL    Basophils Relative 0  0 - 1 %     Basophils Absolute 0.0  0.0 - 0.1 K/uL   COMPREHENSIVE METABOLIC PANEL     Status: Abnormal   Collection Time   02/25/12  2:01 AM      Component Value Range Comment   Sodium 132 (*) 135 - 145 mEq/L    Potassium 3.1 (*) 3.5 - 5.1 mEq/L    Chloride 90 (*) 96 - 112 mEq/L    CO2 23  19 - 32 mEq/L    Glucose, Bld 433 (*) 70 - 99 mg/dL    BUN 9  6 - 23 mg/dL    Creatinine, Ser 2.53  0.50 - 1.10 mg/dL    Calcium 66.4 (*) 8.4 - 10.5 mg/dL    Total Protein 7.3  6.0 - 8.3 g/dL    Albumin 3.9  3.5 - 5.2 g/dL    AST 403 (*) 0 - 37 U/L LIPEMIC SPECIMEN, RESULTS MAY BE AFFECTED.   ALT 319 (*) 0 - 35 U/L LIPEMIC SPECIMEN, RESULTS MAY BE AFFECTED.   Alkaline Phosphatase 139 (*) 39 - 117 U/L    Total Bilirubin 0.4  0.3 - 1.2 mg/dL    GFR calc non Af Amer >90  >90 mL/min    GFR calc Af Amer >90  >90 mL/min   LIPASE, BLOOD     Status: Abnormal   Collection Time   02/25/12  2:59 AM      Component Value Range Comment   Lipase 65 (*) 11 - 59 U/L   LACTIC ACID, PLASMA     Status: Abnormal   Collection Time   02/25/12  3:10 AM      Component Value Range Comment   Lactic Acid, Venous 3.7 (*) 0.5 - 2.2 mmol/L   POCT I-STAT 3, BLOOD  GAS (G3P V)     Status: Abnormal   Collection Time   02/25/12  3:19 AM      Component Value Range Comment   pH, Ven 7.420 (*) 7.250 - 7.300    pCO2, Ven 42.8 (*) 45.0 - 50.0 mmHg    pO2, Ven 46.0 (*) 30.0 - 45.0 mmHg    Bicarbonate 27.8 (*) 20.0 - 24.0 mEq/L    TCO2 29  0 - 100 mmol/L    O2 Saturation 82.0      Acid-Base Excess 3.0 (*) 0.0 - 2.0 mmol/L    Patient temperature 98.4 F      Collection site IV START      Drawn by RT      Sample type VENOUS     GLUCOSE, CAPILLARY     Status: Abnormal   Collection Time   02/25/12  6:29 AM      Component Value Range Comment   Glucose-Capillary 284 (*) 70 - 99 mg/dL    Comment 1 Notify RN      Comment 2 Documented in Chart      Dg Chest 2 View  02/25/2012  *RADIOLOGY REPORT*  Clinical Data: Fever and abdominal pain.   CHEST - 2 VIEW  Comparison: 11/05/2010  Findings: Shallow inspiration. The heart size and pulmonary vascularity are normal. The lungs appear clear and expanded without focal air space disease or consolidation. No blunting of the costophrenic angles.  No significant changes since the previous study.  IMPRESSION: No evidence of active pulmonary disease.  Original Report Authenticated By: Marlon Pel, M.D.   Ct Abdomen Pelvis W Contrast  02/25/2012  *RADIOLOGY REPORT*  Clinical Data: Fever, right flank pain, and history of kidney stones.  CT ABDOMEN AND PELVIS WITH CONTRAST  Technique:  Multidetector CT imaging of the abdomen and pelvis was performed following the standard protocol during bolus administration of intravenous contrast.  Contrast:  100 ml Omnipaque 300  Comparison: 10/07/2011  Findings: Slight fibrosis in the lung bases.  Mild low attenuation change throughout the liver suggesting diffuse fatty infiltration.  The gallbladder is contracted, likely normal in a nonfasting patient.  The pancreas, spleen, adrenal glands, abdominal aorta, and retroperitoneal lymph nodes are unremarkable. Small accessory spleens.  There are punctate stones in the left kidney, largest in the lower pole measuring 4 mm diameter.  These appear stable since the previous study.  Bilateral renal parapelvic cysts.  Sub centimeter parenchymal cysts.  No pyelocaliectasis or ureterectasis.  Nephrograms are otherwise homogeneous and normal. The stomach, small bowel, and colon are not abnormally distended. No free air or free fluid in the abdomen.  Pelvis:  The uterus and adnexal structures are not enlarged.  There is a small calcified fibroid arising from the posterior uterine fundus.  The bladder wall is not thickened.  The appendix is normal.  No free or loculated pelvic fluid collections.  No evidence of diverticulitis despite diverticula throughout the sigmoid and descending colon.  Normal alignment of the lumbar vertebrae.   No significant changes since the previous study.  IMPRESSION: Nonobstructing left renal stones again demonstrated without change. Bilateral parapelvic and parenchymal cysts in the kidneys.  No focal inflammatory processes.  Diffuse fatty infiltration of the liver.  Original Report Authenticated By: Marlon Pel, M.D.    Review of Systems  Constitutional: Positive for fever. Negative for chills, weight loss, malaise/fatigue and diaphoresis.  HENT: Negative for hearing loss, ear pain, nosebleeds, neck pain, tinnitus and ear discharge.  Eyes: Negative for blurred vision, double vision, photophobia, pain and discharge.  Respiratory: Positive for cough. Negative for hemoptysis, sputum production, shortness of breath and wheezing.   Cardiovascular: Negative for chest pain, palpitations, orthopnea, claudication and leg swelling.  Gastrointestinal: Negative for heartburn, nausea, vomiting, abdominal pain, diarrhea, constipation, blood in stool and melena.  Genitourinary: Positive for flank pain. Negative for dysuria, urgency, frequency and hematuria.  Musculoskeletal: Negative for myalgias, back pain, joint pain and falls.  Skin: Negative for itching and rash.  Neurological: Negative for dizziness, tingling, tremors, sensory change, speech change, focal weakness, seizures, loss of consciousness, weakness and headaches.  Endo/Heme/Allergies: Negative for environmental allergies and polydipsia. Does not bruise/bleed easily.  Psychiatric/Behavioral: Negative for depression, suicidal ideas, hallucinations, memory loss and substance abuse. The patient is not nervous/anxious and does not have insomnia.     Blood pressure 111/73, pulse 97, temperature 98.2 F (36.8 C), temperature source Oral, resp. rate 20, height 5\' 7"  (1.702 m), weight 108.7 kg (239 lb 10.2 oz), SpO2 97.00%. Physical Exam  Constitutional: She is oriented to person, place, and time. She appears well-developed and well-nourished. No  distress.  HENT:  Head: Normocephalic and atraumatic.  Mouth/Throat: No oropharyngeal exudate.  Eyes: Conjunctivae are normal. Pupils are equal, round, and reactive to light. Right eye exhibits no discharge. Left eye exhibits no discharge. No scleral icterus.  Neck: Normal range of motion. Neck supple. No JVD present. No tracheal deviation present. No thyromegaly present.  Cardiovascular: Normal rate, regular rhythm and normal heart sounds.  Exam reveals no gallop and no friction rub.   No murmur heard. Respiratory: Effort normal and breath sounds normal. No stridor. No respiratory distress. She has no wheezes. She has no rales. She exhibits no tenderness.  GI: Soft. Bowel sounds are normal. She exhibits no distension and no mass. There is no tenderness. There is no rebound and no guarding.  Musculoskeletal: Normal range of motion. She exhibits no edema and no tenderness.  Lymphadenopathy:    She has no cervical adenopathy.  Neurological: She is alert and oriented to person, place, and time. She has normal reflexes. She displays normal reflexes. No cranial nerve deficit. She exhibits normal muscle tone. Coordination normal.  Skin: Skin is warm and dry. No rash noted. She is not diaphoretic. No erythema. No pallor.  Psychiatric: She has a normal mood and affect. Her behavior is normal. Judgment and thought content normal.     Assessment/Plan Fever uncertain origin ?bronchitis Await blood cultures Empiric tx with zosyn ruq u/s  Abnormal lft Check ruq u/s Acute hepaitis panel  Hyperglycemia:  iss Npo, ns iv  Hypercalcemia Stop calcium NS IV Check mag phos, vit D, tsh, pth, pth rp   Pearson Grippe 02/25/2012, 6:53 AM

## 2012-02-25 NOTE — Progress Notes (Signed)
Patient arrived to [facility] [place].  AOx4.  Oriented to call bell and safety measures.

## 2012-02-25 NOTE — ED Notes (Signed)
Up to bathroom without difficulty. 2nd IV NS bolus in progress.

## 2012-02-25 NOTE — Progress Notes (Signed)
Inpatient Diabetes Program Recommendations  AACE/ADA: New Consensus Statement on Inpatient Glycemic Control (2009)  Target Ranges:  Prepandial:   less than 140 mg/dL      Peak postprandial:   less than 180 mg/dL (1-2 hours)      Critically ill patients:  140 - 180 mg/dL   Reason for Visit: Hyperglycemia  51 year old female admitted with fever, chill, nausea.    Results for MILICA, GULLY (MRN [id number]) as of 02/25/2012 15:41  Ref. Range 02/25/2012 04:18 02/25/2012 06:29  Glucose-Capillary Latest Range: 70-99 mg/dL 409 (H) 811 (H)  Results for EMMALEIGH, LONGO (MRN [id number]) as of 02/25/2012 15:41  Ref. Range 12/10/2011 09:26  Hemoglobin A1C Latest Range: <5.7 % 11.3 (H)  Results for FOREST, PRUDEN (MRN [id number]) as of 02/25/2012 15:41  Ref. Range 02/25/2012 02:01  Glucose Latest Range: 70-99 mg/dL 469 (H)    Inpatient Diabetes Program Recommendations Insulin - Meal Coverage: Add meal coverage insulin - Novolog 6 units tidwc (pt on Humalog 20 units tid at home) HgbA1C: 11.3% on 12/10/2011 - uncontrolled  Note: Would also recommend updated HgbA1C to assess glycemic control prior to hospitalization.

## 2012-02-25 NOTE — Care Management Note (Signed)
    Page 1 of 1   02/27/2012     11:16:54 AM   CARE MANAGEMENT NOTE 02/27/2012  Patient:  Courtney Parks, Courtney Parks   Account Number:  [id number]  Date Initiated:  02/25/2012  Documentation initiated by:  Letha Cape  Subjective/Objective Assessment:   dx fever  admit- lives with son. pta independent.     Action/Plan:   6/25 abd u/s   Anticipated DC Date:  02/27/2012   Anticipated DC Plan:  HOME/SELF CARE      DC Planning Services  CM consult      Choice offered to / List presented to:             Status of service:  Completed, signed off Medicare Important Message given?   (If response is "NO", the following Medicare IM given date fields will be blank) Date Medicare IM given:   Date Additional Medicare IM given:    Discharge Disposition:  HOME/SELF CARE  Per UR Regulation:  Reviewed for med. necessity/level of care/duration of stay  If discussed at Long Length of Stay Meetings, dates discussed:    Comments:  02/26/12 11:16 Letha Cape RN, BSN [phone] patient for dc today, no needs identified.  02/25/12 11:45 Letha Cape RN, BSN [phone] patient lives with son, pta indpendent.  Patient has medication coverage and transportation.  No needs anticiapted.  NCM will continue to follow for dc needs.

## 2012-02-25 NOTE — ED Notes (Signed)
Patient transported to X-ray 

## 2012-02-25 NOTE — Progress Notes (Signed)
PATIENT DETAILS Name: Courtney Parks Age: 51 y.o. Sex: female Date of Birth: Jul 13, 1961 Admit Date: 02/25/2012 ZOX:WRUEAV,WUJW J, MD  Subjective: Admitted with fever, chill and nausea.  Objective: Vital signs in last 24 hours: Filed Vitals:   02/25/12 0507 02/25/12 0612 02/25/12 0642 02/25/12 1010  BP: 111/54  111/73 100/68  Pulse: 97  97 82  Temp:   98.2 F (36.8 C)   TempSrc:   Oral   Resp:   20   Height:  5\' 7"  (1.702 m)    Weight:  108.7 kg (239 lb 10.2 oz)    SpO2: 99%  97%     Weight change:   Body mass index is 37.53 kg/(m^2).  Intake/Output from previous day:  Intake/Output Summary (Last 24 hours) at 02/25/12 1120 Last data filed at 02/25/12 0439  Gross per 24 hour  Intake   1240 ml  Output      0 ml  Net   1240 ml    PHYSICAL EXAM: Gen Exam: Awake and alert with clear speech.  Neck: Supple, No JVD.   Chest: B/L Clear.   CVS: S1 S2 Regular, no murmurs.  Abdomen: soft, BS +, non tender, non distended.  Extremities: no edema, lower extremities warm to touch. Neurologic: Non Focal.   Skin: No Rash.   Wounds: N/A.    CONSULTS:  None  LAB RESULTS: CBC  Lab 02/25/12 0201  WBC 5.3  HGB 14.9  HCT 42.8  PLT 157  MCV 84.6  MCH 29.4  MCHC 34.8  RDW 13.4  LYMPHSABS 1.3  MONOABS 0.5  EOSABS 0.2  BASOSABS 0.0  BANDABS --    Chemistries   Lab 02/25/12 0201  NA 132*  K 3.1*  CL 90*  CO2 23  GLUCOSE 433*  BUN 9  CREATININE 0.50  CALCIUM 11.0*  MG --    CBG:  Lab 02/25/12 0629 02/25/12 0418  GLUCAP 284* 326*    GFR Estimated Creatinine Clearance: 106.8 ml/min (by C-G formula based on Cr of 0.5).  Coagulation profile  Lab 02/25/12 0754  INR 1.03  PROTIME --    Cardiac Enzymes  Lab 02/25/12 0754  CKMB 1.5  TROPONINI --  MYOGLOBIN --    No components found with this basename: POCBNP:3 No results found for this basename: DDIMER:2 in the last 72 hours No results found for this basename: HGBA1C:2 in the last 72 hours No  results found for this basename: CHOL:2,HDL:2,LDLCALC:2,TRIG:2,CHOLHDL:2,LDLDIRECT:2 in the last 72 hours No results found for this basename: TSH,T4TOTAL,FREET3,T3FREE,THYROIDAB in the last 72 hours No results found for this basename: VITAMINB12:2,FOLATE:2,FERRITIN:2,TIBC:2,IRON:2,RETICCTPCT:2 in the last 72 hours  Basename 02/25/12 0259  LIPASE 65*  AMYLASE --    Urine Studies No results found for this basename: UACOL:2,UAPR:2,USPG:2,UPH:2,UTP:2,UGL:2,UKET:2,UBIL:2,UHGB:2,UNIT:2,UROB:2,ULEU:2,UEPI:2,UWBC:2,URBC:2,UBAC:2,CAST:2,CRYS:2,UCOM:2,BILUA:2 in the last 72 hours  MICROBIOLOGY: No results found for this or any previous visit (from the past 240 hour(s)).  RADIOLOGY STUDIES/RESULTS: Dg Chest 2 View  02/25/2012  *RADIOLOGY REPORT*  Clinical Data: Fever and abdominal pain.  CHEST - 2 VIEW  Comparison: 11/05/2010  Findings: Shallow inspiration. The heart size and pulmonary vascularity are normal. The lungs appear clear and expanded without focal air space disease or consolidation. No blunting of the costophrenic angles.  No significant changes since the previous study.  IMPRESSION: No evidence of active pulmonary disease.  Original Report Authenticated By: Marlon Pel, M.D.   Ct Abdomen Pelvis W Contrast  02/25/2012  *RADIOLOGY REPORT*  Clinical Data: Fever, right flank pain, and history of kidney stones.  CT ABDOMEN AND PELVIS WITH CONTRAST  Technique:  Multidetector CT imaging of the abdomen and pelvis was performed following the standard protocol during bolus administration of intravenous contrast.  Contrast:  100 ml Omnipaque 300  Comparison: 10/07/2011  Findings: Slight fibrosis in the lung bases.  Mild low attenuation change throughout the liver suggesting diffuse fatty infiltration.  The gallbladder is contracted, likely normal in a nonfasting patient.  The pancreas, spleen, adrenal glands, abdominal aorta, and retroperitoneal lymph nodes are unremarkable. Small accessory spleens.   There are punctate stones in the left kidney, largest in the lower pole measuring 4 mm diameter.  These appear stable since the previous study.  Bilateral renal parapelvic cysts.  Sub centimeter parenchymal cysts.  No pyelocaliectasis or ureterectasis.  Nephrograms are otherwise homogeneous and normal. The stomach, small bowel, and colon are not abnormally distended. No free air or free fluid in the abdomen.  Pelvis:  The uterus and adnexal structures are not enlarged.  There is a small calcified fibroid arising from the posterior uterine fundus.  The bladder wall is not thickened.  The appendix is normal.  No free or loculated pelvic fluid collections.  No evidence of diverticulitis despite diverticula throughout the sigmoid and descending colon.  Normal alignment of the lumbar vertebrae.  No significant changes since the previous study.  IMPRESSION: Nonobstructing left renal stones again demonstrated without change. Bilateral parapelvic and parenchymal cysts in the kidneys.  No focal inflammatory processes.  Diffuse fatty infiltration of the liver.  Original Report Authenticated By: Marlon Pel, M.D.   Dg Toe 2nd Left  02/13/2012  *RADIOLOGY REPORT*  Clinical Data: :  Diabetes.  Toe injury 1 month ago.  Swelling, reddening.  LEFT SECOND TOE  Comparison: 07/05/2004  Findings: There is soft tissue swelling of the digit.  There is irregularity of the distal aspect of the middle phalanx, there is a fracture of the base of the distal phalanx.  No radiopaque foreign body or soft tissue gas.  IMPRESSION:  1.  Fracture of the base of the distal phalanx second toe. 2.  Associated soft tissue swelling.  Original Report Authenticated By: Patterson Hammersmith, M.D.    MEDICATIONS: Scheduled Meds:   . acetaminophen  650 mg Oral Once  . insulin aspart  0-15 Units Subcutaneous TID WC  . insulin aspart  0-5 Units Subcutaneous QHS  . insulin glargine  65 Units Subcutaneous QHS  . irbesartan  300 mg Oral Daily    . metoprolol succinate  100 mg Oral Daily  . piperacillin-tazobactam (ZOSYN)  IV  3.375 g Intravenous Q8H  . potassium chloride  10 mEq Intravenous Once  . sodium chloride  1,000 mL Intravenous Once  . sodium chloride  1,000 mL Intravenous Once  . DISCONTD: sodium chloride   Intravenous STAT  . DISCONTD: Lutein  1 tablet Oral Daily   Continuous Infusions:   . sodium chloride 150 mL/hr at 02/25/12 0858   PRN Meds:.iohexol  Antibiotics: Anti-infectives     Start     Dose/Rate Route Frequency Ordered Stop   02/25/12 0800  piperacillin-tazobactam (ZOSYN) IVPB 3.375 g       3.375 g 12.5 mL/hr over 240 Minutes Intravenous 3 times per day 02/25/12 7829            Assessment/Plan: Principal Problem:  *Fever -?acute hepatitis-etiology not very evident-denies taking any hepato-toxic meds, await acute hepatitis serology -in interim avoid hepato-toxic meds, stop tylenol. Use NSAIDS if febrile -CT Scan Abd-no acute abnormalities. Await  Liver Ultrasound-but no gallstones see on CT Abdomen -no clear cut foci of infection evident-not toxic looking, no leukocytosis-stop Zosyn and monitor antibiotics-as this could be all viral -blood cultures done on admission-6/25-will need to follow  Pancreatitis -mild elevation in lipase -?viral -see if she can tolerate a diet, if vomits or develops abd pain-will back off on regular diet and downgrade to full or clear liquids  DM -continue with Lantus and SSI -see what her CBG trend is for next 24 hours before adjusting further   HTN -Controlled with Metoprolol and Avapro  Dyslipidemia -LDL 131-patient has DM -cant initiate statins 2/2 elevated LFT's-will need to do as outpatient  Disposition: Remain inpatient  DVT Prophylaxis: On SCD's  Code Status: Full Code  Jeoffrey Massed, MD  [facility] Pager [phone]  If 7PM-7AM, please contact night-coverage [email or web address] Password [email or web address] 02/25/2012, 11:20 AM   LOS: 0 days

## 2012-02-25 NOTE — ED Notes (Signed)
Pt c/o right flank pain with fever of 102 at home tonight. Pt finished abx for UTI x 2 days ago.

## 2012-02-25 NOTE — ED Notes (Signed)
Fluid bolus in progress.

## 2012-02-25 NOTE — ED Notes (Signed)
[facility] given report and report also called to [place] on 5500. Pt's vitals are stable. Sinus on monitor at present. No complaints. Family with pt. Instructed to call for any needs. Belongings will be sent with pt's son.

## 2012-02-25 NOTE — ED Provider Notes (Signed)
History     CSN: [id number]  Arrival date & time 02/25/12  0116   First MD Initiated Contact with Patient 02/25/12 [phone]      Chief Complaint  Patient presents with  . Flank Pain  . Fever    (Consider location/radiation/quality/duration/timing/severity/associated sxs/prior treatment) Patient is a 51 y.o. female presenting with flank pain and fever. The history is provided by the patient.  Flank Pain This is a recurrent problem. Associated symptoms include shortness of breath. Pertinent negatives include no chest pain, no abdominal pain and no headaches.  Fever Primary symptoms of the febrile illness include fever, fatigue, shortness of breath, nausea and vomiting. Primary symptoms do not include headaches, abdominal pain, diarrhea, dysuria or rash.   patient has had right flank pain and a fever tonight. She's been on ciprofloxacin for 10 days for urinary tract infection. She states she finished that 2 days ago. She states her fevers went up to 102 at home. She's had nausea and a little bit of vomiting. No diarrhea. No cough. She states the pain feels like her previous kidney stones. She states she's been sweating constantly for a day now. No headache. She states she feels weak all over. She states she is very fatigued.  Past Medical History  Diagnosis Date  . Hypertension   . Diabetes mellitus   . Kidney stones   . Kidney stones     Past Surgical History  Procedure Date  . Ureteral stent placement   . Ulnar nerve repair     History reviewed. No pertinent family history.  History  Substance Use Topics  . Smoking status: Former Smoker -- 5 years    Types: Cigarettes    Quit date: 12/12/1983  . Smokeless tobacco: Not on file  . Alcohol Use: No    OB History    Grav Para Term Preterm Abortions TAB SAB Ect Mult Living                  Review of Systems  Constitutional: Positive for fever, chills, appetite change and fatigue. Negative for activity change.  HENT:  Negative for neck stiffness.   Eyes: Negative for pain.  Respiratory: Positive for shortness of breath. Negative for chest tightness.   Cardiovascular: Negative for chest pain and leg swelling.  Gastrointestinal: Positive for nausea and vomiting. Negative for abdominal pain and diarrhea.  Genitourinary: Positive for flank pain. Negative for dysuria and pelvic pain.       Patient states she's not usually get dysuria with her urinary tract infections.  Musculoskeletal: Negative for back pain.  Skin: Negative for rash.  Neurological: Negative for weakness, numbness and headaches.  Psychiatric/Behavioral: Negative for behavioral problems.    Allergies  Codeine; Demerol; and Shellfish allergy  Home Medications   No current outpatient prescriptions on file.  BP 111/73  Pulse 97  Temp 98.2 F (36.8 C) (Oral)  Resp 20  Ht 5\' 7"  (1.702 m)  Wt 239 lb 10.2 oz (108.7 kg)  BMI 37.53 kg/m2  SpO2 97%  Physical Exam  Nursing note and vitals reviewed. Constitutional: She is oriented to person, place, and time. She appears well-developed and well-nourished.  HENT:  Head: Normocephalic and atraumatic.  Eyes: EOM are normal. Pupils are equal, round, and reactive to light.  Neck: Normal range of motion. Neck supple.  Cardiovascular: Regular rhythm and normal heart sounds.   No murmur heard.      Tachycardia  Pulmonary/Chest: Effort normal and breath sounds normal. No respiratory distress.  She has no wheezes. She has no rales.  Abdominal: Soft. Bowel sounds are normal. She exhibits no distension. There is no tenderness. There is no rebound and no guarding.  Genitourinary:       CVA tenderness on right  Musculoskeletal: Normal range of motion.  Neurological: She is alert and oriented to person, place, and time. No cranial nerve deficit.  Skin: Skin is warm and dry.  Psychiatric: She has a normal mood and affect. Her speech is normal.    ED Course  Procedures (including critical care  time)  Labs Reviewed  URINALYSIS, ROUTINE W REFLEX MICROSCOPIC - Abnormal; Notable for the following:    Glucose, UA >1000 (*)     Ketones, ur 15 (*)     All other components within normal limits  COMPREHENSIVE METABOLIC PANEL - Abnormal; Notable for the following:    Sodium 132 (*)     Potassium 3.1 (*)     Chloride 90 (*)     Glucose, Bld 433 (*)     Calcium 11.0 (*)     AST 338 (*)  LIPEMIC SPECIMEN, RESULTS MAY BE AFFECTED.   ALT 319 (*)  LIPEMIC SPECIMEN, RESULTS MAY BE AFFECTED.   Alkaline Phosphatase 139 (*)     All other components within normal limits  LIPASE, BLOOD - Abnormal; Notable for the following:    Lipase 65 (*)     All other components within normal limits  LACTIC ACID, PLASMA - Abnormal; Notable for the following:    Lactic Acid, Venous 3.7 (*)     All other components within normal limits  POCT I-STAT 3, BLOOD GAS (G3P V) - Abnormal; Notable for the following:    pH, Ven 7.420 (*)     pCO2, Ven 42.8 (*)     pO2, Ven 46.0 (*)     Bicarbonate 27.8 (*)     Acid-Base Excess 3.0 (*)     All other components within normal limits  GLUCOSE, CAPILLARY - Abnormal; Notable for the following:    Glucose-Capillary 284 (*)     All other components within normal limits  CBC  DIFFERENTIAL  URINE MICROSCOPIC-ADD ON  URINE CULTURE  BLOOD GAS, VENOUS  CULTURE, BLOOD (ROUTINE X 2)  CULTURE, BLOOD (ROUTINE X 2)   Dg Chest 2 View  02/25/2012  *RADIOLOGY REPORT*  Clinical Data: Fever and abdominal pain.  CHEST - 2 VIEW  Comparison: 11/05/2010  Findings: Shallow inspiration. The heart size and pulmonary vascularity are normal. The lungs appear clear and expanded without focal air space disease or consolidation. No blunting of the costophrenic angles.  No significant changes since the previous study.  IMPRESSION: No evidence of active pulmonary disease.  Original Report Authenticated By: Marlon Pel, M.D.   Ct Abdomen Pelvis W Contrast  02/25/2012  *RADIOLOGY REPORT*   Clinical Data: Fever, right flank pain, and history of kidney stones.  CT ABDOMEN AND PELVIS WITH CONTRAST  Technique:  Multidetector CT imaging of the abdomen and pelvis was performed following the standard protocol during bolus administration of intravenous contrast.  Contrast:  100 ml Omnipaque 300  Comparison: 10/07/2011  Findings: Slight fibrosis in the lung bases.  Mild low attenuation change throughout the liver suggesting diffuse fatty infiltration.  The gallbladder is contracted, likely normal in a nonfasting patient.  The pancreas, spleen, adrenal glands, abdominal aorta, and retroperitoneal lymph nodes are unremarkable. Small accessory spleens.  There are punctate stones in the left kidney, largest in the lower pole measuring  4 mm diameter.  These appear stable since the previous study.  Bilateral renal parapelvic cysts.  Sub centimeter parenchymal cysts.  No pyelocaliectasis or ureterectasis.  Nephrograms are otherwise homogeneous and normal. The stomach, small bowel, and colon are not abnormally distended. No free air or free fluid in the abdomen.  Pelvis:  The uterus and adnexal structures are not enlarged.  There is a small calcified fibroid arising from the posterior uterine fundus.  The bladder wall is not thickened.  The appendix is normal.  No free or loculated pelvic fluid collections.  No evidence of diverticulitis despite diverticula throughout the sigmoid and descending colon.  Normal alignment of the lumbar vertebrae.  No significant changes since the previous study.  IMPRESSION: Nonobstructing left renal stones again demonstrated without change. Bilateral parapelvic and parenchymal cysts in the kidneys.  No focal inflammatory processes.  Diffuse fatty infiltration of the liver.  Original Report Authenticated By: Marlon Pel, M.D.     1. Fever   2. Hyperglycemia   3. Elevated liver enzymes       MDM  Patient with fever and right flank pain. Diaphoretic. Recently treated for  UTI. Finished antibiotics 2 days ago. CT abdomen was done to rule out an infected obstructed stone. Did not show a cause of fever. Urinalysis also was negative. Liver enzymes are newly elevated. Lipase is elevated as is lactic acid. Patient feels somewhat better after IV fluids. She'll be admitted to medicine for further evaluation treatment.        Juliet Rude. Rubin Payor, MD 02/25/12 [phone]

## 2012-02-25 NOTE — ED Notes (Signed)
MD at bedside. 

## 2012-02-25 NOTE — ED Notes (Signed)
Pt just finished 10 days of cipro for UTI

## 2012-02-26 ENCOUNTER — Inpatient Hospital Stay (HOSPITAL_COMMUNITY): Payer: 59

## 2012-02-26 DIAGNOSIS — R945 Abnormal results of liver function studies: Secondary | ICD-10-CM

## 2012-02-26 DIAGNOSIS — R7989 Other specified abnormal findings of blood chemistry: Secondary | ICD-10-CM

## 2012-02-26 DIAGNOSIS — R509 Fever, unspecified: Secondary | ICD-10-CM

## 2012-02-26 LAB — CBC
MCH: 28.7 pg (ref 26.0–34.0)
MCV: 86.2 fL (ref 78.0–100.0)
Platelets: 123 10*3/uL — ABNORMAL LOW (ref 150–400)
RBC: 4.42 MIL/uL (ref 3.87–5.11)
RDW: 13.3 % (ref 11.5–15.5)

## 2012-02-26 LAB — COMPREHENSIVE METABOLIC PANEL
AST: 532 U/L — ABNORMAL HIGH (ref 0–37)
Albumin: 3.1 g/dL — ABNORMAL LOW (ref 3.5–5.2)
Alkaline Phosphatase: 108 U/L (ref 39–117)
Chloride: 100 mEq/L (ref 96–112)
Potassium: 3.5 mEq/L (ref 3.5–5.1)
Sodium: 138 mEq/L (ref 135–145)
Total Bilirubin: 0.4 mg/dL (ref 0.3–1.2)

## 2012-02-26 LAB — HEPATITIS PANEL, ACUTE
HCV Ab: NEGATIVE
Hepatitis B Surface Ag: NEGATIVE

## 2012-02-26 LAB — GLUCOSE, CAPILLARY
Glucose-Capillary: 275 mg/dL — ABNORMAL HIGH (ref 70–99)
Glucose-Capillary: 204 mg/dL — ABNORMAL HIGH (ref 70–99)

## 2012-02-26 LAB — URINE CULTURE
Colony Count: 10000
Culture  Setup Time: 201306250816

## 2012-02-26 MED ORDER — SINCALIDE 5 MCG IJ SOLR
INTRAMUSCULAR | Status: AC | PRN
  Administered 2012-02-26: 2.182 ug via INTRAVENOUS

## 2012-02-26 MED ORDER — SINCALIDE 5 MCG IJ SOLR
INTRAMUSCULAR | Status: AC
  Filled 2012-02-26: qty 10

## 2012-02-26 MED ORDER — TECHNETIUM TC 99M MEBROFENIN IV KIT
5.0000 | PACK | Freq: Once | INTRAVENOUS | Status: AC | PRN
  Administered 2012-02-26: 5 via INTRAVENOUS

## 2012-02-26 MED ORDER — SINCALIDE 5 MCG IJ SOLR
0.0200 ug/kg | Freq: Once | INTRAMUSCULAR | Status: DC
  Filled 2012-02-26: qty 5

## 2012-02-26 MED ORDER — INSULIN GLARGINE 100 UNIT/ML ~~LOC~~ SOLN
75.0000 [IU] | Freq: Every day | SUBCUTANEOUS | Status: DC
  Administered 2012-02-26: 75 [IU] via SUBCUTANEOUS

## 2012-02-26 NOTE — Progress Notes (Signed)
[facility] PROGRESS NOTE  Courtney Parks JXB:[id number] DOB: 05-31-1961 DOA: 02/25/2012   Assessment/Plan: Patient Active Hospital Problem List: Fever (02/25/2012) -currently on no antibiotics. Has remained afebrile in last 24 hour. No leukocytosis , abdominal ultrasound showed hepatic steatosis , chest x-ray showed no acute pulmonary processes. Hepatic function panel is pending at this time. She is tolerating her clear liquid diet.  Abdominal pain, unspecified site (05/27/2008)/Elevated LFTs (02/26/2012)  continues to have nausea but able to tolerate diet abdominal ultrasound showed no gallstones. Just positive Murphy sign on physical examination. And LFTs and alkaline phosphatase were elevated, her bilirubin is within normal limits. We'll go to getHIDA scan.  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  Hypertension (01/01/2012) -controlled continue current treatment     Code Status: Full code Family Communication: Mother [phone] Disposition Plan: Inpatient  Lambert Keto, MD  [facility] Pager [phone]  If 7PM-7AM, please contact night-coverage [email or web address] Password [email or web address] 02/26/2012, 10:14 AM   LOS: 1 day   Procedures:  Abdominal ultrasound 02/25/2012: Showed hepatic steatosis  Antibiotics:  None    Subjective: Patient relates he still nauseated, has been tolerating her clear liquid diet. Complaining of some right upper quadrant discomfort.  Objective: Filed Vitals:   02/25/12 1010 02/25/12 1300 02/25/12 1900 02/26/12 0631  BP: 100/68 96/66 83/56  114/77  Pulse: 82 88 85 102  Temp:  98.3 F (36.8 C) 98.6 F (37 C) 98.4 F (36.9 C)  TempSrc:  Oral Oral Oral  Resp:  20 18 18   Height:      Weight:    109.1 kg (240 lb 8.4 oz)  SpO2:  97% 94% 95%    Intake/Output Summary (Last 24 hours) at 02/26/12 1014 Last data filed at 02/26/12 4132  Gross per 24 hour    Intake 3083.33 ml  Output      0 ml  Net 3083.33 ml   Weight change: 2.051 kg (4 lb 8.4 oz)  Exam:  General: Alert, awake, oriented x3, in no acute distress.  HEENT: No bruits, no goiter.  Heart: Regular rate and rhythm, without murmurs, rubs, gallops.  Lungs: Good air movement clear to auscultation  Abdomen: Soft, some right upper quadrant tenderness some mild I would say tenderness with Murphy sign maneuver. Neuro: Grossly intact, nonfocal.   Data Reviewed: Basic Metabolic Panel:  Lab 02/26/12 4401 02/25/12 0201  NA 138 132*  K 3.5 3.1*  CL 100 90*  CO2 24 23  GLUCOSE 262* 433*  BUN 6 9  CREATININE 0.40* 0.50  CALCIUM 8.5 11.0*  MG -- --  PHOS -- --   Liver Function Tests:  Lab 02/26/12 0445 02/25/12 0201  AST 532* 338*  ALT 536* 319*  ALKPHOS 108 139*  BILITOT 0.4 0.4  PROT 5.9* 7.3  ALBUMIN 3.1* 3.9    Lab 02/25/12 0259  LIPASE 65*  AMYLASE --   No results found for this basename: AMMONIA:5 in the last 168 hours CBC:  Lab 02/26/12 0445 02/25/12 0201  WBC 4.3 5.3  NEUTROABS -- 3.3  HGB 12.7 14.9  HCT 38.1 42.8  MCV 86.2 84.6  PLT 123* 157   Cardiac Enzymes:  Lab 02/25/12 0754  CKTOTAL 44  CKMB 1.5  CKMBINDEX --  TROPONINI --   BNP: No components found with this basename: POCBNP:5 CBG:  Lab 02/26/12 0815 02/25/12 2134 02/25/12 1717 02/25/12 1226 02/25/12 0756  GLUCAP 276* 275* 263* 250* 280*  Recent Results (from the past 240 hour(s))  URINE CULTURE     Status: Normal   Collection Time   02/25/12  1:27 AM      Component Value Range Status Comment   Specimen Description URINE, CLEAN CATCH   Final    Special Requests NONE   Final    Culture  Setup Time [id number]   Final    Colony Count 10,000 COLONIES/ML   Final    Culture     Final    Value: Multiple bacterial morphotypes present, none predominant. Suggest appropriate recollection if clinically indicated.   Report Status 02/26/2012 FINAL   Final   CULTURE, BLOOD (ROUTINE X 2)      Status: Normal (Preliminary result)   Collection Time   02/25/12  4:03 AM      Component Value Range Status Comment   Specimen Description BLOOD RIGHT ARM   Final    Special Requests BOTTLES DRAWN AEROBIC AND ANAEROBIC 5CC EACH   Final    Culture  Setup Time [id number]   Final    Culture     Final    Value:        BLOOD CULTURE RECEIVED NO GROWTH TO DATE CULTURE WILL BE HELD FOR 5 DAYS BEFORE ISSUING A FINAL NEGATIVE REPORT   Report Status PENDING   Incomplete   CULTURE, BLOOD (ROUTINE X 2)     Status: Normal (Preliminary result)   Collection Time   02/25/12  4:15 AM      Component Value Range Status Comment   Specimen Description BLOOD LEFT ARM   Final    Special Requests BOTTLES DRAWN AEROBIC AND ANAEROBIC EACH   Final    Culture  Setup Time [id number]   Final    Culture     Final    Value:        BLOOD CULTURE RECEIVED NO GROWTH TO DATE CULTURE WILL BE HELD FOR 5 DAYS BEFORE ISSUING A FINAL NEGATIVE REPORT   Report Status PENDING   Incomplete      Studies: Dg Chest 2 View  02/25/2012  *RADIOLOGY REPORT*  Clinical Data: Fever and abdominal pain.  CHEST - 2 VIEW  Comparison: 11/05/2010  Findings: Shallow inspiration. The heart size and pulmonary vascularity are normal. The lungs appear clear and expanded without focal air space disease or consolidation. No blunting of the costophrenic angles.  No significant changes since the previous study.  IMPRESSION: No evidence of active pulmonary disease.  Original Report Authenticated By: Marlon Pel, M.D.   US Abdomen Complete  02/25/2012  *RADIOLOGY REPORT*  Clinical Data:  Elevated liver function tests.  COMPLETE ABDOMINAL ULTRASOUND  Comparison:  CT of earlier today.  Findings:  Gallbladder:  Normal, without wall thickening, stone, or pericholecystic fluid.  Sonographic Murphy's sign was not elicited.  Common bile duct: Normal, 4 mm.  Liver: Moderately increased in echogenicity.  IVC: Negative  Pancreas:  Negative  Spleen:   Normal in size and echogenicity.  Right Kidney:  13.7 cm. No hydronephrosis.  Left Kidney:  14.7 cm.  Apparent fullness within the lower pole left renal collecting system corresponds to sinus cyst on today's CT.  Abdominal aorta:  Nonaneurysmal without ascites.  IMPRESSION: Hepatic steatosis.  No other explanation for elevated liver function tests.  Original Report Authenticated By: Consuello Bossier, M.D.   Ct Abdomen Pelvis W Contrast  02/25/2012  *RADIOLOGY REPORT*  Clinical Data: Fever, right flank pain, and history of kidney stones.  CT  ABDOMEN AND PELVIS WITH CONTRAST  Technique:  Multidetector CT imaging of the abdomen and pelvis was performed following the standard protocol during bolus administration of intravenous contrast.  Contrast:  100 ml Omnipaque 300  Comparison: 10/07/2011  Findings: Slight fibrosis in the lung bases.  Mild low attenuation change throughout the liver suggesting diffuse fatty infiltration.  The gallbladder is contracted, likely normal in a nonfasting patient.  The pancreas, spleen, adrenal glands, abdominal aorta, and retroperitoneal lymph nodes are unremarkable. Small accessory spleens.  There are punctate stones in the left kidney, largest in the lower pole measuring 4 mm diameter.  These appear stable since the previous study.  Bilateral renal parapelvic cysts.  Sub centimeter parenchymal cysts.  No pyelocaliectasis or ureterectasis.  Nephrograms are otherwise homogeneous and normal. The stomach, small bowel, and colon are not abnormally distended. No free air or free fluid in the abdomen.  Pelvis:  The uterus and adnexal structures are not enlarged.  There is a small calcified fibroid arising from the posterior uterine fundus.  The bladder wall is not thickened.  The appendix is normal.  No free or loculated pelvic fluid collections.  No evidence of diverticulitis despite diverticula throughout the sigmoid and descending colon.  Normal alignment of the lumbar vertebrae.  No  significant changes since the previous study.  IMPRESSION: Nonobstructing left renal stones again demonstrated without change. Bilateral parapelvic and parenchymal cysts in the kidneys.  No focal inflammatory processes.  Diffuse fatty infiltration of the liver.  Original Report Authenticated By: Marlon Pel, M.D.   Dg Toe 2nd Left  02/13/2012  *RADIOLOGY REPORT*  Clinical Data: :  Diabetes.  Toe injury 1 month ago.  Swelling, reddening.  LEFT SECOND TOE  Comparison: 07/05/2004  Findings: There is soft tissue swelling of the digit.  There is irregularity of the distal aspect of the middle phalanx, there is a fracture of the base of the distal phalanx.  No radiopaque foreign body or soft tissue gas.  IMPRESSION:  1.  Fracture of the base of the distal phalanx second toe. 2.  Associated soft tissue swelling.  Original Report Authenticated By: Patterson Hammersmith, M.D.    Scheduled Meds:    . insulin aspart  0-15 Units Subcutaneous TID WC  . insulin aspart  0-5 Units Subcutaneous QHS  . insulin glargine  75 Units Subcutaneous QHS  . irbesartan  300 mg Oral Daily  . metoprolol succinate  100 mg Oral Daily  . DISCONTD: insulin glargine  65 Units Subcutaneous QHS  . DISCONTD: piperacillin-tazobactam (ZOSYN)  IV  3.375 g Intravenous Q8H   Continuous Infusions:    . sodium chloride 100 mL/hr at 02/26/12 [phone]

## 2012-02-27 DIAGNOSIS — R932 Abnormal findings on diagnostic imaging of liver and biliary tract: Secondary | ICD-10-CM

## 2012-02-27 DIAGNOSIS — R945 Abnormal results of liver function studies: Secondary | ICD-10-CM

## 2012-02-27 DIAGNOSIS — K819 Cholecystitis, unspecified: Secondary | ICD-10-CM

## 2012-02-27 DIAGNOSIS — R509 Fever, unspecified: Secondary | ICD-10-CM

## 2012-02-27 DIAGNOSIS — E1165 Type 2 diabetes mellitus with hyperglycemia: Secondary | ICD-10-CM

## 2012-02-27 DIAGNOSIS — K811 Chronic cholecystitis: Secondary | ICD-10-CM

## 2012-02-27 DIAGNOSIS — R7989 Other specified abnormal findings of blood chemistry: Secondary | ICD-10-CM

## 2012-02-27 DIAGNOSIS — E118 Type 2 diabetes mellitus with unspecified complications: Secondary | ICD-10-CM

## 2012-02-27 DIAGNOSIS — R109 Unspecified abdominal pain: Secondary | ICD-10-CM

## 2012-02-27 LAB — HEPATIC FUNCTION PANEL
AST: 308 U/L — ABNORMAL HIGH (ref 0–37)
Albumin: 3.3 g/dL — ABNORMAL LOW (ref 3.5–5.2)
Alkaline Phosphatase: 115 U/L (ref 39–117)
Total Protein: 6.3 g/dL (ref 6.0–8.3)

## 2012-02-27 LAB — GLUCOSE, CAPILLARY
Glucose-Capillary: 233 mg/dL — ABNORMAL HIGH (ref 70–99)
Glucose-Capillary: 192 mg/dL — ABNORMAL HIGH (ref 70–99)
Glucose-Capillary: 152 mg/dL — ABNORMAL HIGH (ref 70–99)

## 2012-02-27 LAB — SURGICAL PCR SCREEN
MRSA, PCR: NEGATIVE
Staphylococcus aureus: NEGATIVE

## 2012-02-27 MED ORDER — CEFAZOLIN SODIUM-DEXTROSE 2-3 GM-% IV SOLR
2.0000 g | Freq: Once | INTRAVENOUS | Status: DC
  Filled 2012-02-27: qty 50

## 2012-02-27 MED ORDER — SODIUM CHLORIDE 0.9 % IV SOLN
INTRAVENOUS | Status: DC
  Administered 2012-02-28: via INTRAVENOUS

## 2012-02-27 MED ORDER — INSULIN GLARGINE 100 UNIT/ML ~~LOC~~ SOLN
85.0000 [IU] | Freq: Every day | SUBCUTANEOUS | Status: DC
  Administered 2012-02-28: 85 [IU] via SUBCUTANEOUS

## 2012-02-27 MED ORDER — ONDANSETRON HCL 4 MG/2ML IJ SOLN
4.0000 mg | INTRAMUSCULAR | Status: DC | PRN
  Administered 2012-02-27 – 2012-02-28 (×3): 4 mg via INTRAVENOUS
  Filled 2012-02-27 (×2): qty 2

## 2012-02-27 MED ORDER — IBUPROFEN 800 MG PO TABS
800.0000 mg | ORAL_TABLET | Freq: Three times a day (TID) | ORAL | Status: DC | PRN
  Administered 2012-02-27: 800 mg via ORAL
  Filled 2012-02-27 (×2): qty 1

## 2012-02-27 NOTE — Consult Note (Signed)
[facility] Consult Note  Reason for Consult:Abdominal pain, elevated LFT's, ? Gall bladder Referring Physician: Marinda Elk, MD  Courtney Parks is an 51 y.o. female.  HPI: Patient presented to ED with abdominal pain and right sided flank pain, nausea, and fevers on 02/25/12.  She was found to have elevated AST of 338 and ALT of 319, and a mildly elevated lipase 65, and lactic acid of 3.7.  However, she had a normal WBC and has been afebrile.  She says that she has had chronic flank pain and kidney stones for a long time, but the pain got worse on Monday night and she began developing fevers.  She describes the pain as intermittent.  She says the pain is 3/10 at baseline and 4-5/10 at its worst. She has had nausea, not vomiting.    Since admission, she has had CT abdomen and pelvis, Abdominal US, and HIDA scan that were negative for cholecystitis or gall stones.  However, her pain and nausea has gotten worse, and her LFT's have increased.   Past Medical History  Diagnosis Date  . Hypertension   . Diabetes mellitus   . Kidney stones   . Kidney stones     Past Surgical History  Procedure Date  . Ureteral stent placement   . Ulnar nerve repair     Family History  Problem Relation Age of Onset  . Diabetes Mother   . Hypertension Mother   . Hypertension Father   The patient also notes that she has had a great-grandmother, mother, and aunt who all had atypical presentations of cholecystitis.   Social History:  reports that she quit smoking about 28 years ago. Her smoking use included Cigarettes. She quit after 5 years of use. She does not have any smokeless tobacco history on file. She reports that she does not drink alcohol or use illicit drugs.  Allergies:  Allergies  Allergen Reactions  . Codeine     REACTION: Nausea  . Demerol Nausea And Vomiting  . Shellfish Allergy Swelling    Medications: I have reviewed the patient's current medications.  Results for  orders placed during the hospital encounter of 02/25/12 (from the past 48 hour(s))  GLUCOSE, CAPILLARY     Status: Abnormal   Collection Time   02/25/12 12:26 PM      Component Value Range Comment   Glucose-Capillary 250 (*) 70 - 99 mg/dL   GLUCOSE, CAPILLARY     Status: Abnormal   Collection Time   02/25/12  5:17 PM      Component Value Range Comment   Glucose-Capillary 263 (*) 70 - 99 mg/dL   GLUCOSE, CAPILLARY     Status: Abnormal   Collection Time   02/25/12  9:34 PM      Component Value Range Comment   Glucose-Capillary 275 (*) 70 - 99 mg/dL   COMPREHENSIVE METABOLIC PANEL     Status: Abnormal   Collection Time   02/26/12  4:45 AM      Component Value Range Comment   Sodium 138  135 - 145 mEq/L    Potassium 3.5  3.5 - 5.1 mEq/L    Chloride 100  96 - 112 mEq/L    CO2 24  19 - 32 mEq/L    Glucose, Bld 262 (*) 70 - 99 mg/dL    BUN 6  6 - 23 mg/dL    Creatinine, Ser 1.61 (*) 0.50 - 1.10 mg/dL    Calcium 8.5  8.4 - 09.6  mg/dL    Total Protein 5.9 (*) 6.0 - 8.3 g/dL    Albumin 3.1 (*) 3.5 - 5.2 g/dL    AST 161 (*) 0 - 37 U/L    ALT 536 (*) 0 - 35 U/L    Alkaline Phosphatase 108  39 - 117 U/L    Total Bilirubin 0.4  0.3 - 1.2 mg/dL    GFR calc non Af Amer >90  >90 mL/min    GFR calc Af Amer >90  >90 mL/min   CBC     Status: Abnormal   Collection Time   02/26/12  4:45 AM      Component Value Range Comment   WBC 4.3  4.0 - 10.5 K/uL    RBC 4.42  3.87 - 5.11 MIL/uL    Hemoglobin 12.7  12.0 - 15.0 g/dL    HCT 09.6  04.5 - 40.9 %    MCV 86.2  78.0 - 100.0 fL    MCH 28.7  26.0 - 34.0 pg    MCHC 33.3  30.0 - 36.0 g/dL    RDW 81.1  91.4 - 78.2 %    Platelets 123 (*) 150 - 400 K/uL   GLUCOSE, CAPILLARY     Status: Abnormal   Collection Time   02/26/12  8:15 AM      Component Value Range Comment   Glucose-Capillary 276 (*) 70 - 99 mg/dL   GLUCOSE, CAPILLARY     Status: Abnormal   Collection Time   02/26/12 12:24 PM      Component Value Range Comment   Glucose-Capillary 254 (*)  70 - 99 mg/dL   GLUCOSE, CAPILLARY     Status: Abnormal   Collection Time   02/26/12  4:53 PM      Component Value Range Comment   Glucose-Capillary 204 (*) 70 - 99 mg/dL   GLUCOSE, CAPILLARY     Status: Abnormal   Collection Time   02/26/12  9:48 PM      Component Value Range Comment   Glucose-Capillary 234 (*) 70 - 99 mg/dL    Comment 1 Notify RN      Comment 2 Documented in Chart     GLUCOSE, CAPILLARY     Status: Abnormal   Collection Time   02/27/12  8:43 AM      Component Value Range Comment   Glucose-Capillary 233 (*) 70 - 99 mg/dL    Comment 1 Documented in Chart      Comment 2 Notify RN       Nm Hepatobiliary Liver Func  02/26/2012  *RADIOLOGY REPORT*  Clinical Data: History of abdominal pain and elevated liver function tests.  NUCLEAR MEDICINE HEPATOBILIARY IMAGING WITH GALLBLADDER EF  Technique: Sequential images of the abdomen were obtained out to 60 minutes following intravenous administration of radiopharmaceutical. After slow intravenous infusion of 2.18 uCg Cholecystokinin, gallbladder ejection fraction was determined.  Radiopharmaceutical: 5 mCi Tc-35m Choletec  Comparison: Ultrasound 02/25/2012.  Findings: There is prompt visualization of hepatic activity. There is prompt visualization of the common bile duct. Subsequently intestinal activity was identified.  The gallbladder began to visualize at 17 minutes.  During CCK infusion the gallbladder contracted 8.1%.  This is abnormally low. 30% or greater is normal range.  During CCK infusion the patient reported  experiencing mild pressure sensation in the right upper quadrant.  IMPRESSION: There is demonstration of patency of the common bile duct and the cystic duct. There is no evidence of cholecystitis.  During CCK infusion  the gallbladder contracted 8.1%.  This is abnormally low. 30% or greater is normal range.  During CCK infusion the patient reported  experiencing mild pressure sensation in the right upper quadrant.  Original  Report Authenticated By: Crawford Givens, M.D.   US Abdomen Complete  02/25/2012  *RADIOLOGY REPORT*  Clinical Data:  Elevated liver function tests.  COMPLETE ABDOMINAL ULTRASOUND  Comparison:  CT of earlier today.  Findings:  Gallbladder:  Normal, without wall thickening, stone, or pericholecystic fluid.  Sonographic Murphy's sign was not elicited.  Common bile duct: Normal, 4 mm.  Liver: Moderately increased in echogenicity.  IVC: Negative  Pancreas:  Negative  Spleen:  Normal in size and echogenicity.  Right Kidney:  13.7 cm. No hydronephrosis.  Left Kidney:  14.7 cm.  Apparent fullness within the lower pole left renal collecting system corresponds to sinus cyst on today's CT.  Abdominal aorta:  Nonaneurysmal without ascites.  IMPRESSION: Hepatic steatosis.  No other explanation for elevated liver function tests.  Original Report Authenticated By: Consuello Bossier, M.D.    ROS Blood pressure 131/87, pulse 81, temperature 98 F (36.7 C), temperature source Oral, resp. rate 18, height 5\' 7"  (1.702 m), weight 240 lb 8.4 oz (109.1 kg), SpO2 97.00%. Physical Exam BP 131/87  Pulse 81  Temp 98 F (36.7 C) (Oral)  Resp 18  Ht 5\' 7"  (1.702 m)  Wt 240 lb 8.4 oz (109.1 kg)  BMI 37.67 kg/m2  SpO2 97% General appearance: alert, cooperative and no distress Head: Normocephalic, without obvious abnormality, atraumatic Abdomen: hypoactive BS, soft, + tenderness to palpation worst in RUQ, no rebound or guarding.  Extremities: extremities normal, atraumatic, no cyanosis or edema Pulses: 2+ and symmetric   Assessment/Plan: 51 yo F w/ pmhx of DM II, Obesity, kidney stones, who presents with fevers at home, colicky abdominal pain, and elevated LFT's, with normal imaging, but LFT's still concerning for gall bladder pathology:  1) Elevated LFT's- no stones seen in gall bladder or common bile duct, however with rising LFT's and mildly elevated lipase, a stone is certainly a likely candidate.  Awaiting LFT's today-It  is unclear what is causing the rise in the transaminases.  Normal bilirubin, so not likely to be a CBD obstruction  2) FEN/GI- patient has already eaten today, with rising LFT's will give clear liquids, make her NPO after midnight with MIVF.    CHAMBERLAIN,RACHEL 02/27/2012, 11:28 AM   Will tentatively plan lap chole with IOC tomorrow.  The surgical procedure has been discussed with the patient.  Potential risks, benefits, alternative treatments, and expected outcomes have been explained.  All of the patient's questions at this time have been answered.  The likelihood of reaching the patient's treatment goal is good.  The patient understand the proposed surgical procedure and wishes to proceed.   Wilmon Arms. Corliss Skains, MD, [facility]  02/27/2012 1:05 PM

## 2012-02-27 NOTE — Progress Notes (Signed)
[facility] PROGRESS NOTE  Deaven Barron AVW:[id number] DOB: 30-Nov-1960 DOA: 02/25/2012   Assessment/Plan: Patient Active Hospital Problem List: Fever (02/25/2012) -currently on no antibiotics. Has remained afebrile in last 24 hour. No leukocytosis , abdominal ultrasound showed hepatic steatosis , Hepatic function panel is pending at this time. She is tolerating her clear liquid diet. -HIDA low EF, no obstruction, consult GI.  Abdominal pain, unspecified site (05/27/2008)/Elevated LFTs (02/26/2012) -able to tolerate diet abdominal ultrasound showed no gallstones. positive Murphy sign on physical examination. And LFTs and alkaline phosphatase were elevated, her bilirubin is within normal limits.   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  Hypertension (01/01/2012) -controlled continue current treatment.   Code Status: Full code Family Communication: Mother [phone] Disposition Plan: Inpatient  Lambert Keto, MD  [facility] Pager [phone]  If 7PM-7AM, please contact night-coverage [email or web address] Password [email or web address] 02/27/2012, 10:15 AM   LOS: 2 days   Procedures:  Abdominal ultrasound 02/25/2012: Showed hepatic steatosis  Antibiotics:  None    Subjective: Patient relates he still nauseated, has been tolerating her clear liquid diet. Complaining of some right upper quadrant discomfort.  Objective: Filed Vitals:   02/26/12 0631 02/26/12 1300 02/26/12 2139 02/27/12 0557  BP: 114/77 101/72 117/79 131/87  Pulse: 102 91 82 81  Temp: 98.4 F (36.9 C) 98 F (36.7 C) 99.1 F (37.3 C) 98 F (36.7 C)  TempSrc: Oral Oral Oral Oral  Resp: 18 18 20 18   Height:      Weight: 109.1 kg (240 lb 8.4 oz)     SpO2: 95% 94% 96% 97%    Intake/Output Summary (Last 24 hours) at 02/27/12 1015 Last data filed at 02/27/12 0900  Gross per 24 hour  Intake 1473.83 ml  Output      0 ml    Net 1473.83 ml   Weight change:   Exam:  General: Alert, awake, oriented x3, in no acute distress.  HEENT: No bruits, no goiter.  Heart: Regular rate and rhythm, without murmurs, rubs, gallops.  Lungs: Good air movement clear to auscultation  Abdomen: Soft, some right upper quadrant tenderness some mild I would say tenderness with Murphy sign maneuver. Neuro: Grossly intact, nonfocal.   Data Reviewed: Basic Metabolic Panel:  Lab 02/26/12 6295 02/25/12 0201  NA 138 132*  K 3.5 3.1*  CL 100 90*  CO2 24 23  GLUCOSE 262* 433*  BUN 6 9  CREATININE 0.40* 0.50  CALCIUM 8.5 11.0*  MG -- --  PHOS -- --   Liver Function Tests:  Lab 02/26/12 0445 02/25/12 0201  AST 532* 338*  ALT 536* 319*  ALKPHOS 108 139*  BILITOT 0.4 0.4  PROT 5.9* 7.3  ALBUMIN 3.1* 3.9    Lab 02/25/12 0259  LIPASE 65*  AMYLASE --   No results found for this basename: AMMONIA:5 in the last 168 hours CBC:  Lab 02/26/12 0445 02/25/12 0201  WBC 4.3 5.3  NEUTROABS -- 3.3  HGB 12.7 14.9  HCT 38.1 42.8  MCV 86.2 84.6  PLT 123* 157   Cardiac Enzymes:  Lab 02/25/12 0754  CKTOTAL 44  CKMB 1.5  CKMBINDEX --  TROPONINI --   BNP: No components found with this basename: POCBNP:5 CBG:  Lab 02/27/12 0843 02/26/12 2148 02/26/12 1653 02/26/12 1224 02/26/12 0815  GLUCAP 233* 234* 204* 254* 276*    Recent Results (from the past 240 hour(s))  URINE CULTURE     Status:  Normal   Collection Time   02/25/12  1:27 AM      Component Value Range Status Comment   Specimen Description URINE, CLEAN CATCH   Final    Special Requests NONE   Final    Culture  Setup Time [id number]   Final    Colony Count 10,000 COLONIES/ML   Final    Culture     Final    Value: Multiple bacterial morphotypes present, none predominant. Suggest appropriate recollection if clinically indicated.   Report Status 02/26/2012 FINAL   Final   CULTURE, BLOOD (ROUTINE X 2)     Status: Normal (Preliminary result)   Collection Time    02/25/12  4:03 AM      Component Value Range Status Comment   Specimen Description BLOOD RIGHT ARM   Final    Special Requests BOTTLES DRAWN AEROBIC AND ANAEROBIC 5CC EACH   Final    Culture  Setup Time [id number]   Final    Culture     Final    Value:        BLOOD CULTURE RECEIVED NO GROWTH TO DATE CULTURE WILL BE HELD FOR 5 DAYS BEFORE ISSUING A FINAL NEGATIVE REPORT   Report Status PENDING   Incomplete   CULTURE, BLOOD (ROUTINE X 2)     Status: Normal (Preliminary result)   Collection Time   02/25/12  4:15 AM      Component Value Range Status Comment   Specimen Description BLOOD LEFT ARM   Final    Special Requests BOTTLES DRAWN AEROBIC AND ANAEROBIC EACH   Final    Culture  Setup Time [id number]   Final    Culture     Final    Value:        BLOOD CULTURE RECEIVED NO GROWTH TO DATE CULTURE WILL BE HELD FOR 5 DAYS BEFORE ISSUING A FINAL NEGATIVE REPORT   Report Status PENDING   Incomplete      Studies: Dg Chest 2 View  02/25/2012  *RADIOLOGY REPORT*  Clinical Data: Fever and abdominal pain.  CHEST - 2 VIEW  Comparison: 11/05/2010  Findings: Shallow inspiration. The heart size and pulmonary vascularity are normal. The lungs appear clear and expanded without focal air space disease or consolidation. No blunting of the costophrenic angles.  No significant changes since the previous study.  IMPRESSION: No evidence of active pulmonary disease.  Original Report Authenticated By: Marlon Pel, M.D.   US Abdomen Complete  02/25/2012  *RADIOLOGY REPORT*  Clinical Data:  Elevated liver function tests.  COMPLETE ABDOMINAL ULTRASOUND  Comparison:  CT of earlier today.  Findings:  Gallbladder:  Normal, without wall thickening, stone, or pericholecystic fluid.  Sonographic Murphy's sign was not elicited.  Common bile duct: Normal, 4 mm.  Liver: Moderately increased in echogenicity.  IVC: Negative  Pancreas:  Negative  Spleen:  Normal in size and echogenicity.  Right Kidney:  13.7 cm. No  hydronephrosis.  Left Kidney:  14.7 cm.  Apparent fullness within the lower pole left renal collecting system corresponds to sinus cyst on today's CT.  Abdominal aorta:  Nonaneurysmal without ascites.  IMPRESSION: Hepatic steatosis.  No other explanation for elevated liver function tests.  Original Report Authenticated By: Consuello Bossier, M.D.   Ct Abdomen Pelvis W Contrast  02/25/2012  *RADIOLOGY REPORT*  Clinical Data: Fever, right flank pain, and history of kidney stones.  CT ABDOMEN AND PELVIS WITH CONTRAST  Technique:  Multidetector CT imaging of the abdomen and  pelvis was performed following the standard protocol during bolus administration of intravenous contrast.  Contrast:  100 ml Omnipaque 300  Comparison: 10/07/2011  Findings: Slight fibrosis in the lung bases.  Mild low attenuation change throughout the liver suggesting diffuse fatty infiltration.  The gallbladder is contracted, likely normal in a nonfasting patient.  The pancreas, spleen, adrenal glands, abdominal aorta, and retroperitoneal lymph nodes are unremarkable. Small accessory spleens.  There are punctate stones in the left kidney, largest in the lower pole measuring 4 mm diameter.  These appear stable since the previous study.  Bilateral renal parapelvic cysts.  Sub centimeter parenchymal cysts.  No pyelocaliectasis or ureterectasis.  Nephrograms are otherwise homogeneous and normal. The stomach, small bowel, and colon are not abnormally distended. No free air or free fluid in the abdomen.  Pelvis:  The uterus and adnexal structures are not enlarged.  There is a small calcified fibroid arising from the posterior uterine fundus.  The bladder wall is not thickened.  The appendix is normal.  No free or loculated pelvic fluid collections.  No evidence of diverticulitis despite diverticula throughout the sigmoid and descending colon.  Normal alignment of the lumbar vertebrae.  No significant changes since the previous study.  IMPRESSION:  Nonobstructing left renal stones again demonstrated without change. Bilateral parapelvic and parenchymal cysts in the kidneys.  No focal inflammatory processes.  Diffuse fatty infiltration of the liver.  Original Report Authenticated By: Marlon Pel, M.D.   Dg Toe 2nd Left  02/13/2012  *RADIOLOGY REPORT*  Clinical Data: :  Diabetes.  Toe injury 1 month ago.  Swelling, reddening.  LEFT SECOND TOE  Comparison: 07/05/2004  Findings: There is soft tissue swelling of the digit.  There is irregularity of the distal aspect of the middle phalanx, there is a fracture of the base of the distal phalanx.  No radiopaque foreign body or soft tissue gas.  IMPRESSION:  1.  Fracture of the base of the distal phalanx second toe. 2.  Associated soft tissue swelling.  Original Report Authenticated By: Patterson Hammersmith, M.D.    Scheduled Meds:    . insulin aspart  0-15 Units Subcutaneous TID WC  . insulin aspart  0-5 Units Subcutaneous QHS  . insulin glargine  85 Units Subcutaneous QHS  . irbesartan  300 mg Oral Daily  . metoprolol succinate  100 mg Oral Daily  . sincalide      . sincalide  0.02 mcg/kg Intravenous Once  . DISCONTD: insulin glargine  75 Units Subcutaneous QHS   Continuous Infusions:    . sodium chloride 10 mL/hr at 02/27/12 [phone]

## 2012-02-27 NOTE — Consult Note (Signed)
[facility] Consult: 9:00 AM 02/27/2012   Referring Provider: David Stall MD Primary Care Physician:  Margaree Mackintosh, MD  Endocrinologist:  Debara Pickett MD Primary Gastroenterologist:  Dr Lina Sar  Reason for Consultation:  Elevated LFTs  HPI: Courtney Parks is a 51 y.o. female with insulin requiring DM 2, obesity, hx kidney stones, ureteral stones and lithotripsy about 18 months ago.  Admitted 6/25 with fever of 102, nausea, right flank pain pulse to 130s.  12 days PTA passed a kidney stone and treated with po Cipro for UTI, finished this 6/21.  6/22 had malaise, profuse/soaking sweats continuing along with nausea and anorexia through 6/23.  Had RUQ and right flank/thoracic rib region discomfort develope around 6/23.  Took 400 Mg Ibuprofen for this and for B hip/buttock pain on 6/24.  Ibuprofen continues to control both arthritic pain and RUQ/back pain.  Labs show increased transaminases into 300's, repeat levels into 500s 02/26/12. Alk phos initially elevated at 139, normalized to 108.  Bilirubin not elevated.  Lipase was 65.  WBCs normal. US shows fatty liver.  HIDA scan shows reduced GB EF.  No gallstones or signs of biliary disease.  Viral hepatitis studies negative. Both her mother and maternal aunt had difficult to diagnose "occult"  GB disease with resolution of GI complaints post cholecystectomy.  She has had minor increase of LFTs in past, attributed to fatty liver. Has not vomitted, appetite still reduced.   Colonoscopy in 2004 with tubular adenoma, EGD in 2006 with fundal gland polyp.  She is overdue and contrite about not following up for colonoscopy.  ROS: No headache, dizzyness.  Immature cataract does not impede vision significantly.  No LE numbness or tingling pain.   No heartburn, dysphagia, generally no early satiety. Good exercise tolerance, does work out and take walks, really trying to lose weight.  But weigh stable for years  within 2 to 3  #. Recently had Humalog with SS added to Lantus insulin.  In Hospital, dose of Lantus has been increased. Menopausal, no periods for 3 years.  No hot flashes/night sweats but recent soaking sweats with current problem. No skin rash or itching, no sores.   No nose bleeds or clotting issues. Takes Ibuprofen 800 mg about once every 10 days. ETOH a few times per year, last was 2 drinks in May when she was on beach vacation.  No tea-colored urine.  No foul-smelling urine.      Past Medical History  Diagnosis Date  . Hypertension   . Diabetes mellitus   . Kidney stones   . Kidney stones     Past Surgical History  Procedure Date  . Ureteral stent placement   . Ulnar nerve repair     Prior to Admission medications   Medication Sig Start Date End Date Taking? Authorizing Provider  CINNAMON PO Take 2 capsules by mouth daily. Purchases over the counter   Yes Historical Provider, MD  hydrochlorothiazide (HYDRODIURIL) 25 MG tablet Take 25 mg by mouth daily.   Yes Historical Provider, MD  ibuprofen (ADVIL,MOTRIN) 200 MG tablet Take 600-800 mg by mouth 2 (two) times daily as needed. pain   Yes Historical Provider, MD  insulin glargine (LANTUS) 100 UNIT/ML injection Inject 65 Units into the skin at bedtime.    Yes Historical Provider, MD  insulin lispro (HUMALOG) 100 UNIT/ML injection Inject 20 Units into the skin daily.   Yes Historical Provider, MD  KRILL OIL PO Take 1 capsule by mouth daily.   Yes Historical Provider,  MD  Lutein 20 MG TABS Take 20 mg by mouth daily. Over the counter medication   Yes Historical Provider, MD  metFORMIN (GLUCOPHAGE) 1000 MG tablet Take 1,000 mg by mouth 2 (two) times daily with a meal.   Yes Historical Provider, MD  metoprolol succinate (TOPROL-XL) 100 MG 24 hr tablet Take 100 mg by mouth daily. Take with or immediately following a meal.   Yes Historical Provider, MD  valsartan (DIOVAN) 320 MG tablet Take 320 mg by mouth daily.   Yes Historical  Provider, MD    Scheduled Meds:    . insulin aspart  0-15 Units Subcutaneous TID WC  . insulin aspart  0-5 Units Subcutaneous QHS  . insulin glargine  85 Units Subcutaneous QHS  . irbesartan  300 mg Oral Daily  . metoprolol succinate  100 mg Oral Daily  . sincalide      . sincalide  0.02 mcg/kg Intravenous Once   Infusions:    . sodium chloride 10 mL/hr at 02/27/12 0629   PRN Meds: ibuprofen, sincalide, technetium TC 78M mebrofenin,    Allergies as of 02/25/2012 - Review Complete 02/25/2012  Allergen Reaction Noted  . Codeine  05/27/2008  . Demerol Nausea And Vomiting 10/07/2011  . Shellfish allergy Swelling 10/07/2011    Family History  Problem Relation Age of Onset  . Diabetes Mother   . Hypertension Mother   . Hypertension Father     History   Social History  . Marital Status: Divorced    Spouse Name: N/A    Number of Children: N/A  . Years of Education: N/A   Occupational History  . Not on file.   Social History Main Topics  . Smoking status: Former Smoker -- 5 years    Types: Cigarettes    Quit date: 12/12/1983  . Smokeless tobacco: Not on file  . Alcohol Use: No  . Drug Use: No  . Sexually Active: Not on file   Other Topics Concern  . Not on file   Social History Narrative  . No narrative on file     PHYSICAL EXAM: Vital signs Temp:  98 F (36.7 C) Pulse Rate:  81  Resp:   18   BP:  131/87 mmHg  SpO2:  97 %  Weight 240 #, 109 KG, BMI 37  General: Overweight, well-appearing WF.  No distress.  Head:  No facial edema or assymmetry  Eyes:  No icterus, EOMI, no conj pallor Ears:  Not HOH  Nose:  No discharge or conjestion Mouth:  Pink, moist, clear mucosa Neck:  No mass, jVD, TMG Lungs:  Clear B.  No dyspnea or cough Heart: RRR, no MRG Abdomen:  Obese, soft, BS hypoactive.   Rectal: deferred   Musc/Skeltl: no joint erythema, swelling, deformity Extremities:  No pedal edema  Neurologic:  No tremor, no gross deficits, oriented x  3.  Excellent historian Skin:  No jaundice,no rash Tattoos:  none Nodes:  No adenopathy at neck or groin   Psych:  Pleasant, not depressed or anxious.   Intake/Output from previous day: Not being strictly monitored.     LAB RESULTS:  Basename 02/26/12 0445 02/25/12 0201  WBC 4.3 5.3  HGB 12.7 14.9  HCT 38.1 42.8  PLT 123* 157   BMET Lab Results  Component Value Date   NA 138 02/26/2012   NA 132* 02/25/2012   NA 134* 02/07/2012   K 3.5 02/26/2012   K 3.1* 02/25/2012   K 3.7 02/07/2012  CL 100 02/26/2012   CL 90* 02/25/2012   CL 96 02/07/2012   CO2 24 02/26/2012   CO2 23 02/25/2012   CO2 24 02/07/2012   GLUCOSE 262* 02/26/2012   GLUCOSE 433* 02/25/2012   GLUCOSE 178* 02/07/2012   BUN 6 02/26/2012   BUN 9 02/25/2012   BUN 8 02/07/2012   CREATININE 0.40* 02/26/2012   CREATININE 0.50 02/25/2012   CREATININE 0.37* 02/07/2012   CALCIUM 8.5 02/26/2012   CALCIUM 11.0* 02/25/2012   CALCIUM 9.9 02/07/2012   LFT  Basename 02/26/12 0445 02/25/12 0201  PROT 5.9* 7.3  ALBUMIN 3.1* 3.9  AST 532* 338*  ALT 536* 319*  ALKPHOS 108 139*  BILITOT 0.4 0.4  BILIDIR -- --  IBILI -- --   PT/INR Lab Results  Component Value Date   INR 1.03 02/25/2012   Hepatitis Panel  Basename 02/25/12 0754  HEPBSAG NEGATIVE  HCVAB NEGATIVE  HEPAIGM NEGATIVE  HEPBIGM NEGATIVE   C-Diff No components found with this basename: cdiff    Drugs of Abuse  No results found for this basename: labopia, cocainscrnur, labbenz, amphetmu, thcu, labbarb     RADIOLOGY STUDIES: Nm Hepatobiliary Liver Func 02/26/2012   NUCLEAR MEDICINE HEPATOBILIARY IMAGING WITH GALLBLADDER EF  Technique: Sequential images of the abdomen were obtained out to 60 minutes following intravenous administration of radiopharmaceutical. After slow intravenous infusion of 2.18 uCg Cholecystokinin, gallbladder ejection fraction was determined.  Radiopharmaceutical: 5 mCi Tc-67m Choletec  Comparison: Ultrasound 02/25/2012.  Findings: There is prompt  visualization of hepatic activity. There is prompt visualization of the common bile duct. Subsequently intestinal activity was identified.  The gallbladder began to visualize at 17 minutes.  During CCK infusion the gallbladder contracted 8.1%.  This is abnormally low. 30% or greater is normal range.  During CCK infusion the patient reported  experiencing mild pressure sensation in the right upper quadrant.  IMPRESSION: There is demonstration of patency of the common bile duct and the cystic duct. There is no evidence of cholecystitis.  During CCK infusion the gallbladder contracted 8.1%.  This is abnormally low. 30% or greater is normal range.  During CCK infusion the patient reported  experiencing mild pressure sensation in the right upper quadrant.  Original Report Authenticated By: Crawford Givens, M.D.   US Abdomen Complete 02/25/2012  *RADIOLOGY REPORT*  Clinical Data:  Elevated liver function tests.  COMPLETE ABDOMINAL ULTRASOUND  Comparison:  CT of earlier today.  Findings:  Gallbladder:  Normal, without wall thickening, stone, or pericholecystic fluid.  Sonographic Murphy's sign was not elicited.  Common bile duct: Normal, 4 mm.  Liver: Moderately increased in echogenicity.  IVC: Negative  Pancreas:  Negative  Spleen:  Normal in size and echogenicity.  Right Kidney:  13.7 cm. No hydronephrosis.  Left Kidney:  14.7 cm.  Apparent fullness within the lower pole left renal collecting system corresponds to sinus cyst on today's CT.  Abdominal aorta:  Nonaneurysmal without ascites.  IMPRESSION: Hepatic steatosis.  No other explanation for elevated liver function tests.  Original Report Authenticated By: Consuello Bossier, M.D.    ENDOSCOPIC STUDIES: 12/2004   EGD  Lina Sar for eval abd pain. Findings  Normal: Distal Esophagus.  POLYP: in Fundus. Maximum size: 3 mm. Diminutive, sessile polyp.  Procedure: biopsy without cautery, removed, Polyp retrieved, sent to  pathology. Await pathology. Fundic gland  polyp PPI: Pantoprazole/Protonix 40 mg QD. Donnatal 1 BID   08/2003   Colonoscopy  For hematochezia and rectal pain. POLYP: Descending Colon, Maximum size:  7 mm. pedunculated polyp, sent to pathology.  POLYP: Descending Colon, Maximum size: 8 mm. pedunculated polyp.  sent to pathology:  Tubular adenomas, no HGD NORMAL EXAM: Cecum.    IMPRESSION: *  Right abdominal and flank pain with elevated transaminases, mild increase of lipase. Low GB EF.\ Suspect occult biliary disease, possibly passed a stone..  *  IDDM *  Hypercalcemia, to 11.0 on 6/25 with normal albumin.  *  Colon adenoma 2004, fundal gland gastric polyp 2006.  Will need out pt repeat colonoscopy screening in next few months.   PLAN: *  Call for general surgery consult.    LOS: 2 days   Jennye Moccasin  02/27/2012, 9:00 AM Pager: [phone] I have reviewed the above note, examined the patient and agree with plan of treatment.All information points to an acute biliary colic, causing elevation of LFT's above her baseline elevation due to fatty liver. Physical exam confirms RUQ tenderness and her HIDA scan reveals sub optimal  EF of 9%. Also strong F hx of gall bladder disease, M, Aunt and MGGM, I suggest IV antibiotic coverage and surgical consult for lap chole. She is overdue for screening colonoscopy which can be done as an outpatient.

## 2012-02-28 ENCOUNTER — Inpatient Hospital Stay (HOSPITAL_COMMUNITY): Payer: 59 | Admitting: Anesthesiology

## 2012-02-28 ENCOUNTER — Encounter (HOSPITAL_COMMUNITY)
Admission: EM | Disposition: A | Discharge status: Home / Self Care | Payer: Self-pay | Source: Home / Self Care | Attending: Internal Medicine

## 2012-02-28 ENCOUNTER — Encounter (HOSPITAL_COMMUNITY): Payer: Self-pay | Admitting: Anesthesiology

## 2012-02-28 DIAGNOSIS — K819 Cholecystitis, unspecified: Secondary | ICD-10-CM

## 2012-02-28 DIAGNOSIS — E118 Type 2 diabetes mellitus with unspecified complications: Secondary | ICD-10-CM

## 2012-02-28 DIAGNOSIS — E1165 Type 2 diabetes mellitus with hyperglycemia: Secondary | ICD-10-CM

## 2012-02-28 DIAGNOSIS — R7989 Other specified abnormal findings of blood chemistry: Secondary | ICD-10-CM

## 2012-02-28 DIAGNOSIS — R945 Abnormal results of liver function studies: Secondary | ICD-10-CM

## 2012-02-28 HISTORY — PX: CHOLECYSTECTOMY: SHX55

## 2012-02-28 LAB — GLUCOSE, CAPILLARY
Glucose-Capillary: 208 mg/dL — ABNORMAL HIGH (ref 70–99)
Glucose-Capillary: 221 mg/dL — ABNORMAL HIGH (ref 70–99)
Glucose-Capillary: 228 mg/dL — ABNORMAL HIGH (ref 70–99)

## 2012-02-28 SURGERY — LAPAROSCOPIC CHOLECYSTECTOMY WITH INTRAOPERATIVE CHOLANGIOGRAM
Anesthesia: General | Wound class: Clean Contaminated

## 2012-02-28 MED ORDER — NEOSTIGMINE METHYLSULFATE 1 MG/ML IJ SOLN
INTRAMUSCULAR | Status: DC | PRN
  Administered 2012-02-28: 4 mg via INTRAVENOUS

## 2012-02-28 MED ORDER — ONDANSETRON HCL 4 MG/2ML IJ SOLN
INTRAMUSCULAR | Status: DC | PRN
  Administered 2012-02-28: 4 mg via INTRAVENOUS

## 2012-02-28 MED ORDER — CEFAZOLIN SODIUM 1-5 GM-% IV SOLN
1.0000 g | Freq: Four times a day (QID) | INTRAVENOUS | Status: AC
  Administered 2012-02-28 – 2012-02-29 (×3): 1 g via INTRAVENOUS
  Filled 2012-02-28 (×4): qty 50

## 2012-02-28 MED ORDER — LORAZEPAM 2 MG/ML IJ SOLN
0.5000 mg | Freq: Once | INTRAMUSCULAR | Status: AC
  Administered 2012-02-28: 0.5 mg via INTRAVENOUS
  Filled 2012-02-28: qty 1

## 2012-02-28 MED ORDER — KETOROLAC TROMETHAMINE 30 MG/ML IJ SOLN
INTRAMUSCULAR | Status: DC | PRN
  Administered 2012-02-28: 30 mg via INTRAVENOUS

## 2012-02-28 MED ORDER — CEFAZOLIN SODIUM 1-5 GM-% IV SOLN
INTRAVENOUS | Status: AC
  Filled 2012-02-28: qty 50

## 2012-02-28 MED ORDER — DEXTROSE 5 % IV SOLN
INTRAVENOUS | Status: DC | PRN
  Administered 2012-02-28: 10:00:00 via INTRAVENOUS

## 2012-02-28 MED ORDER — LIDOCAINE HCL (CARDIAC) 20 MG/ML IV SOLN
INTRAVENOUS | Status: DC | PRN
  Administered 2012-02-28: 50 mg via INTRAVENOUS

## 2012-02-28 MED ORDER — LACTATED RINGERS IV SOLN
INTRAVENOUS | Status: DC
  Administered 2012-02-28: 09:00:00 via INTRAVENOUS

## 2012-02-28 MED ORDER — BUPIVACAINE-EPINEPHRINE PF 0.25-1:200000 % IJ SOLN
INTRAMUSCULAR | Status: AC
  Filled 2012-02-28: qty 30

## 2012-02-28 MED ORDER — SODIUM CHLORIDE 0.9 % IV SOLN
INTRAVENOUS | Status: DC | PRN
  Administered 2012-02-28: 10:00:00

## 2012-02-28 MED ORDER — HYDROMORPHONE HCL PF 1 MG/ML IJ SOLN
0.2500 mg | INTRAMUSCULAR | Status: DC | PRN
  Administered 2012-02-28: 0.5 mg via INTRAVENOUS

## 2012-02-28 MED ORDER — ROCURONIUM BROMIDE 100 MG/10ML IV SOLN
INTRAVENOUS | Status: DC | PRN
  Administered 2012-02-28: 40 mg via INTRAVENOUS
  Administered 2012-02-28: 10 mg via INTRAVENOUS

## 2012-02-28 MED ORDER — ACETAMINOPHEN 10 MG/ML IV SOLN
1000.0000 mg | Freq: Once | INTRAVENOUS | Status: DC
  Filled 2012-02-28: qty 100

## 2012-02-28 MED ORDER — MIDAZOLAM HCL 2 MG/2ML IJ SOLN: 0.5000 mg | INTRAMUSCULAR | Status: DC | PRN

## 2012-02-28 MED ORDER — ENOXAPARIN SODIUM 40 MG/0.4ML ~~LOC~~ SOLN
40.0000 mg | SUBCUTANEOUS | Status: DC
  Administered 2012-02-29: 40 mg via SUBCUTANEOUS
  Filled 2012-02-28 (×2): qty 0.4

## 2012-02-28 MED ORDER — LIDOCAINE HCL 4 % MT SOLN
OROMUCOSAL | Status: DC | PRN
  Administered 2012-02-28: 4 mL via TOPICAL

## 2012-02-28 MED ORDER — MORPHINE SULFATE 2 MG/ML IJ SOLN
2.0000 mg | Freq: Once | INTRAMUSCULAR | Status: AC
  Administered 2012-02-28: 2 mg via INTRAVENOUS
  Filled 2012-02-28: qty 1

## 2012-02-28 MED ORDER — HYDROMORPHONE HCL PF 1 MG/ML IJ SOLN: 1.0000 mg | INTRAMUSCULAR | Status: DC | PRN

## 2012-02-28 MED ORDER — ONDANSETRON HCL 4 MG/2ML IJ SOLN
INTRAMUSCULAR | Status: AC
  Filled 2012-02-28: qty 2

## 2012-02-28 MED ORDER — METOCLOPRAMIDE HCL 5 MG/ML IJ SOLN
10.0000 mg | Freq: Once | INTRAMUSCULAR | Status: AC | PRN
  Administered 2012-02-28: 10 mg via INTRAVENOUS

## 2012-02-28 MED ORDER — BUPIVACAINE-EPINEPHRINE 0.25% -1:200000 IJ SOLN
INTRAMUSCULAR | Status: DC | PRN
  Administered 2012-02-28: 9 mL

## 2012-02-28 MED ORDER — FENTANYL CITRATE 0.05 MG/ML IJ SOLN
INTRAMUSCULAR | Status: DC | PRN
  Administered 2012-02-28: 100 ug via INTRAVENOUS
  Administered 2012-02-28: 150 ug via INTRAVENOUS

## 2012-02-28 MED ORDER — CEFAZOLIN SODIUM 1-5 GM-% IV SOLN
INTRAVENOUS | Status: DC | PRN
  Administered 2012-02-28: 1 g via INTRAVENOUS

## 2012-02-28 MED ORDER — GLYCOPYRROLATE 0.2 MG/ML IJ SOLN
INTRAMUSCULAR | Status: DC | PRN
  Administered 2012-02-28: .5 mg via INTRAVENOUS

## 2012-02-28 MED ORDER — OXYCODONE-ACETAMINOPHEN 5-325 MG PO TABS
1.0000 | ORAL_TABLET | ORAL | Status: DC | PRN
  Administered 2012-02-28 (×2): 1 via ORAL
  Administered 2012-02-29: 2 via ORAL
  Administered 2012-02-29: 1 via ORAL
  Filled 2012-02-28 (×2): qty 1
  Filled 2012-02-28 (×3): qty 2

## 2012-02-28 MED ORDER — OXYCODONE HCL 5 MG PO TABS: 5.0000 mg | ORAL_TABLET | Freq: Once | ORAL | Status: DC | PRN

## 2012-02-28 MED ORDER — SODIUM CHLORIDE 0.9 % IR SOLN
Status: DC | PRN
  Administered 2012-02-28: 1000 mL

## 2012-02-28 MED ORDER — ACETAMINOPHEN 10 MG/ML IV SOLN
INTRAVENOUS | Status: DC | PRN
  Administered 2012-02-28: 1000 mg via INTRAVENOUS

## 2012-02-28 MED ORDER — SODIUM CHLORIDE 0.9 % IV SOLN
INTRAVENOUS | Status: DC | PRN
  Administered 2012-02-28 (×2): via INTRAVENOUS

## 2012-02-28 MED ORDER — PROPOFOL 10 MG/ML IV EMUL
INTRAVENOUS | Status: DC | PRN
  Administered 2012-02-28: 200 mg via INTRAVENOUS

## 2012-02-28 MED ORDER — FENTANYL CITRATE 0.05 MG/ML IJ SOLN: 50.0000 ug | INTRAMUSCULAR | Status: DC | PRN

## 2012-02-28 MED ORDER — DEXAMETHASONE SODIUM PHOSPHATE 4 MG/ML IJ SOLN
INTRAMUSCULAR | Status: DC | PRN
  Administered 2012-02-28: 4 mg via INTRAVENOUS

## 2012-02-28 MED ORDER — ACETAMINOPHEN 10 MG/ML IV SOLN
INTRAVENOUS | Status: AC
  Filled 2012-02-28: qty 100

## 2012-02-28 MED ORDER — DROPERIDOL 2.5 MG/ML IJ SOLN
INTRAMUSCULAR | Status: DC | PRN
  Administered 2012-02-28: 0.625 mg via INTRAVENOUS

## 2012-02-28 MED ORDER — KETOROLAC TROMETHAMINE 15 MG/ML IJ SOLN
15.0000 mg | Freq: Once | INTRAMUSCULAR | Status: AC
  Administered 2012-02-28: 15 mg via INTRAVENOUS
  Filled 2012-02-28 (×2): qty 1

## 2012-02-28 MED ORDER — SODIUM CHLORIDE 0.9 % IV SOLN
INTRAVENOUS | Status: DC
  Administered 2012-02-28: 21:00:00 via INTRAVENOUS

## 2012-02-28 MED ORDER — LACTATED RINGERS IV SOLN
INTRAVENOUS | Status: DC | PRN
  Administered 2012-02-28: 09:00:00 via INTRAVENOUS

## 2012-02-28 MED ORDER — METOCLOPRAMIDE HCL 5 MG/ML IJ SOLN
INTRAMUSCULAR | Status: AC
  Filled 2012-02-28: qty 2

## 2012-02-28 MED ORDER — HYDROMORPHONE HCL PF 1 MG/ML IJ SOLN
INTRAMUSCULAR | Status: AC
  Filled 2012-02-28: qty 1

## 2012-02-28 MED ORDER — MIDAZOLAM HCL 5 MG/5ML IJ SOLN
INTRAMUSCULAR | Status: DC | PRN
  Administered 2012-02-28: 2 mg via INTRAVENOUS

## 2012-02-28 MED ORDER — IBUPROFEN 600 MG PO TABS
600.0000 mg | ORAL_TABLET | Freq: Four times a day (QID) | ORAL | Status: DC | PRN
  Filled 2012-02-28: qty 1

## 2012-02-28 SURGICAL SUPPLY — 46 items
APPLIER CLIP ROT 10 11.4 M/L (STAPLE) ×2
BENZOIN TINCTURE PRP APPL 2/3 (GAUZE/BANDAGES/DRESSINGS) ×2 IMPLANT
BLADE SURG ROTATE 9660 (MISCELLANEOUS) IMPLANT
CANISTER SUCTION 2500CC (MISCELLANEOUS) ×2 IMPLANT
CHLORAPREP W/TINT 26ML (MISCELLANEOUS) ×2 IMPLANT
CLIP APPLIE ROT 10 11.4 M/L (STAPLE) ×1 IMPLANT
CLOTH BEACON ORANGE TIMEOUT ST (SAFETY) ×2 IMPLANT
CLSR STERI-STRIP ANTIMIC 1/2X4 (GAUZE/BANDAGES/DRESSINGS) ×2 IMPLANT
COVER MAYO STAND STRL (DRAPES) ×2 IMPLANT
COVER SURGICAL LIGHT HANDLE (MISCELLANEOUS) ×2 IMPLANT
DECANTER SPIKE VIAL GLASS SM (MISCELLANEOUS) ×4 IMPLANT
DRAPE C-ARM 42X72 X-RAY (DRAPES) ×2 IMPLANT
DRAPE UTILITY 15X26 W/TAPE STR (DRAPE) ×4 IMPLANT
DRSG TEGADERM 4X4.75 (GAUZE/BANDAGES/DRESSINGS) ×2 IMPLANT
ELECT REM PT RETURN 9FT ADLT (ELECTROSURGICAL) ×2
ELECTRODE REM PT RTRN 9FT ADLT (ELECTROSURGICAL) ×1 IMPLANT
FILTER SMOKE EVAC LAPAROSHD (FILTER) ×2 IMPLANT
GAUZE SPONGE 2X2 8PLY STRL LF (GAUZE/BANDAGES/DRESSINGS) ×1 IMPLANT
GLOVE BIO SURGEON STRL SZ7 (GLOVE) ×2 IMPLANT
GLOVE BIO SURGEON STRL SZ7.5 (GLOVE) ×2 IMPLANT
GLOVE BIO SURGEON STRL SZ8 (GLOVE) ×2 IMPLANT
GLOVE BIOGEL PI IND STRL 7.0 (GLOVE) ×1 IMPLANT
GLOVE BIOGEL PI IND STRL 7.5 (GLOVE) ×2 IMPLANT
GLOVE BIOGEL PI IND STRL 8 (GLOVE) ×1 IMPLANT
GLOVE BIOGEL PI INDICATOR 7.0 (GLOVE) ×1
GLOVE BIOGEL PI INDICATOR 7.5 (GLOVE) ×2
GLOVE BIOGEL PI INDICATOR 8 (GLOVE) ×1
GOWN STRL NON-REIN LRG LVL3 (GOWN DISPOSABLE) ×8 IMPLANT
KIT BASIN OR (CUSTOM PROCEDURE TRAY) ×2 IMPLANT
KIT ROOM TURNOVER OR (KITS) ×2 IMPLANT
NS IRRIG 1000ML POUR BTL (IV SOLUTION) ×2 IMPLANT
PAD ARMBOARD 7.5X6 YLW CONV (MISCELLANEOUS) ×2 IMPLANT
POUCH SPECIMEN RETRIEVAL 10MM (ENDOMECHANICALS) ×2 IMPLANT
SCISSORS LAP 5X35 DISP (ENDOMECHANICALS) IMPLANT
SET CHOLANGIOGRAPH 5 50 .035 (SET/KITS/TRAYS/PACK) IMPLANT
SET IRRIG TUBING LAPAROSCOPIC (IRRIGATION / IRRIGATOR) ×2 IMPLANT
SLEEVE ENDOPATH XCEL 5M (ENDOMECHANICALS) ×2 IMPLANT
SPECIMEN JAR SMALL (MISCELLANEOUS) ×2 IMPLANT
SPONGE GAUZE 2X2 STER 10/PKG (GAUZE/BANDAGES/DRESSINGS) ×1
SUT MNCRL AB 4-0 PS2 18 (SUTURE) ×2 IMPLANT
TOWEL OR 17X24 6PK STRL BLUE (TOWEL DISPOSABLE) ×2 IMPLANT
TOWEL OR 17X26 10 PK STRL BLUE (TOWEL DISPOSABLE) ×2 IMPLANT
TRAY LAPAROSCOPIC (CUSTOM PROCEDURE TRAY) ×2 IMPLANT
TROCAR XCEL BLUNT TIP 100MML (ENDOMECHANICALS) ×2 IMPLANT
TROCAR XCEL NON-BLD 11X100MML (ENDOMECHANICALS) ×2 IMPLANT
TROCAR XCEL NON-BLD 5MMX100MML (ENDOMECHANICALS) ×2 IMPLANT

## 2012-02-28 NOTE — Transfer of Care (Signed)
Immediate Anesthesia Transfer of Care Note  Patient: Courtney Parks  Procedure(s) Performed: Procedure(s) (LRB): LAPAROSCOPIC CHOLECYSTECTOMY WITH INTRAOPERATIVE CHOLANGIOGRAM (N/A)  Patient Location: PACU  Anesthesia Type: General  Level of Consciousness: awake, alert  and oriented  Airway & Oxygen Therapy: Patient Spontanous Breathing and Patient connected to nasal cannula oxygen  Post-op Assessment: Report given to PACU RN and Post -op Vital signs reviewed and stable  Post vital signs: Reviewed and stable  Complications: No apparent anesthesia complications

## 2012-02-28 NOTE — Preoperative (Signed)
Beta Blockers   Reason not to administer Beta Blockers:Not Applicable, taken this am per patient & RN

## 2012-02-28 NOTE — Anesthesia Postprocedure Evaluation (Signed)
Anesthesia Post Note  Patient: Courtney Parks  Procedure(s) Performed: Procedure(s) (LRB): LAPAROSCOPIC CHOLECYSTECTOMY WITH INTRAOPERATIVE CHOLANGIOGRAM (N/A)  Anesthesia type: general  Patient location: PACU  Post pain: Pain level controlled  Post assessment: Patient's Cardiovascular Status Stable  Last Vitals:  Filed Vitals:   02/28/12 1156  BP:   Pulse: 73  Temp:   Resp: 15    Post vital signs: Reviewed and stable  Level of consciousness: sedated  Complications: No apparent anesthesia complications

## 2012-02-28 NOTE — Anesthesia Procedure Notes (Signed)
Procedure Name: Intubation Date/Time: 02/28/2012 9:32 AM Performed by: Nicholos Johns Pre-anesthesia Checklist: Patient identified, Emergency Drugs available, Suction available, Patient being monitored and Timeout performed Patient Re-evaluated:Patient Re-evaluated prior to inductionOxygen Delivery Method: Circle system utilized Preoxygenation: Pre-oxygenation with 100% oxygen Intubation Type: IV induction Ventilation: Mask ventilation without difficulty Laryngoscope Size: Mac and 3 Grade View: Grade I Tube type: Oral Tube size: 7.5 mm Number of attempts: 1 Airway Equipment and Method: Stylet Placement Confirmation: ETT inserted through vocal cords under direct vision,  positive ETCO2 and breath sounds checked- equal and bilateral Secured at: 22 cm Tube secured with: Tape Dental Injury: Teeth and Oropharynx as per pre-operative assessment

## 2012-02-28 NOTE — Op Note (Signed)
Laparoscopic Cholecystectomy Procedure Note  Indications: This patient presents with symptomatic gallbladder disease and will undergo laparoscopic cholecystectomy.  Pre-operative Diagnosis: Chronic cholecystitis  Post-operative Diagnosis: Same  Surgeon: Keylen Uzelac K.   Assistants: Violeta Gelinas    Anesthesia: General endotracheal anesthesia  ASA Class: 2  Procedure Details  The patient was seen again in the Holding Room. The risks, benefits, complications, treatment options, and expected outcomes were discussed with the patient. The possibilities of reaction to medication, pulmonary aspiration, perforation of viscus, bleeding, recurrent infection, finding a normal gallbladder, the need for additional procedures, failure to diagnose a condition, the possible need to convert to an open procedure, and creating a complication requiring transfusion or operation were discussed with the patient. The likelihood of improving the patient's symptoms with return to their baseline status is good.  The patient and/or family concurred with the proposed plan, giving informed consent. The site of surgery properly noted. The patient was taken to Operating Room, identified as Courtney Parks and the procedure verified as Laparoscopic Cholecystectomy with Intraoperative Cholangiogram. A Time Out was held and the above information confirmed.  Prior to the induction of general anesthesia, antibiotic prophylaxis was administered. General endotracheal anesthesia was then administered and tolerated well. After the induction, the abdomen was prepped with Chloraprep and draped in sterile fashion. The patient was positioned in the supine position.  Local anesthetic agent was injected into the skin near the umbilicus and an incision made. We dissected down to the abdominal fascia with blunt dissection.  The fascia was incised vertically and we entered the peritoneal cavity bluntly.  A pursestring suture of 0-Vicryl was  placed around the fascial opening.  The Hasson cannula was inserted and secured with the stay suture.  Pneumoperitoneum was then created with CO2 and tolerated well without any adverse changes in the patient's vital signs. An 11-mm port was placed in the subxiphoid position.  Two 5-mm ports were placed in the right upper quadrant. All skin incisions were infiltrated with a local anesthetic agent before making the incision and placing the trocars.   We positioned the patient in reverse Trendelenburg, tilted slightly to the patient's left.  The liver appeared to have a moderate degree of fatty infiltration.  The gallbladder was identified, the fundus grasped and retracted cephalad. Adhesions were lysed bluntly and with the electrocautery where indicated, taking care not to injure any adjacent organs or viscus. The infundibulum was grasped and retracted laterally, exposing the peritoneum overlying the triangle of Calot. This was then divided and exposed in a blunt fashion. The cystic duct was identified and bluntly dissected circumferentially. A critical view of the cystic duct and cystic artery was obtained.   The cystic artery was, dissected free, ligated with clips and divided.  The cystic duct was then ligated with clips and divided.   The gallbladder was dissected from the liver bed in retrograde fashion with the electrocautery. The gallbladder was removed and placed in an Endocatch sac. The liver bed was irrigated and inspected. Hemostasis was achieved with the electrocautery. Copious irrigation was utilized and was repeatedly aspirated until clear.  The gallbladder and Endocatch sac were then removed through the umbilical port site.  The pursestring suture was used to close the umbilical fascia.    We again inspected the right upper quadrant for hemostasis.  Pneumoperitoneum was released as we removed the trocars.  4-0 Monocryl was used to close the skin.   Benzoin, steri-strips, and clean dressings were  applied. The patient was then extubated  and brought to the recovery room in stable condition. Instrument, sponge, and needle counts were correct at closure and at the conclusion of the case.   Findings: Cholecystitis without Cholelithiasis  Estimated Blood Loss: Minimal         Drains: none         Specimens: Gallbladder           Complications: None; patient tolerated the procedure well.         Disposition: PACU - hemodynamically stable.         Condition: stable  Wilmon Arms. Corliss Skains, MD, [facility]  02/28/2012 10:44 AM

## 2012-02-28 NOTE — Progress Notes (Signed)
Still with some nausea/ mild RUQ tenderness Very low GB EF - probably biliary dyskinesia vs. Sludge causing intermittent obstruction of CBD    Plan lap chole with IOC today.  The surgical procedure has been discussed with the patient.  Potential risks, benefits, alternative treatments, and expected outcomes have been explained.  All of the patient's questions at this time have been answered.  The likelihood of reaching the patient's treatment goal is good.  The patient understand the proposed surgical procedure and wishes to proceed.  Wilmon Arms. Corliss Skains, MD, [facility]  02/28/2012 7:35 AM

## 2012-02-28 NOTE — Anesthesia Preprocedure Evaluation (Addendum)
Anesthesia Evaluation  Patient identified by MRN, date of birth, ID band Patient awake    Reviewed: Allergy & Precautions, H&P , NPO status , Patient's Chart, lab work & pertinent test results, reviewed documented beta blocker date and time   Airway Mallampati: II TM Distance: >3 FB Neck ROM: full    Dental  (+) Teeth Intact and Dental Advisory Given   Pulmonary neg pulmonary ROS,          Cardiovascular hypertension, On Medications and On Home Beta Blockers     Neuro/Psych negative neurological ROS  negative psych ROS   GI/Hepatic negative GI ROS, Neg liver ROS,   Endo/Other  Diabetes mellitus-, Well Controlled, Type 2, Insulin Dependent and Oral Hypoglycemic AgentsMorbid obesity  Renal/GU negative Renal ROS  negative genitourinary   Musculoskeletal  (+) Arthritis -, Osteoarthritis,    Abdominal   Peds  Hematology negative hematology ROS (+)   Anesthesia Other Findings See surgeon's H&P   Reproductive/Obstetrics negative OB ROS                         Anesthesia Physical Anesthesia Plan  ASA: II  Anesthesia Plan: General   Post-op Pain Management:    Induction: Intravenous  Airway Management Planned: Oral ETT  Additional Equipment:   Intra-op Plan:   Post-operative Plan: Extubation in OR  Informed Consent: I have reviewed the patients History and Physical, chart, labs and discussed the procedure including the risks, benefits and alternatives for the proposed anesthesia with the patient or authorized representative who has indicated his/her understanding and acceptance.   Dental Advisory Given and Dental advisory given  Plan Discussed with: CRNA, Surgeon and Anesthesiologist  Anesthesia Plan Comments:        Anesthesia Quick Evaluation

## 2012-02-28 NOTE — Progress Notes (Signed)
[facility] PROGRESS NOTE  Courtney Parks VHQ:[id number] DOB: 1961-03-02 DOA: 02/25/2012   Assessment/Plan: Patient Active Hospital Problem List: Fever (02/25/2012) -currently on no antibiotics. Has remained afebrile in last 24 hour. No leukocytosis , abdominal ultrasound showed hepatic steatosis , She is tolerating her clear liquid diet. -HIDA low EF, surgery consulted laparoscopic cholecystectomy on 02/20/2012  Abdominal pain, unspecified site (05/27/2008)/Elevated LFTs (02/26/2012) -positive Murphy sign on physical examination. And LFTs and alkaline phosphatase were elevated.  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  Hypertension (01/01/2012) -controlled continue current treatment.   Code Status: Full code Family Communication: Mother [phone] Disposition Plan: Inpatient  Lambert Keto, MD  [facility] Pager [phone]  If 7PM-7AM, please contact night-coverage [email or web address] Password [email or web address] 02/28/2012, 9:57 AM   LOS: 3 days   Procedures:  Abdominal ultrasound 02/25/2012: Showed hepatic steatosis.  High scan showed low EF 8% 02/27/2012  Laparoscopic cholecystectomy 02/28/2012  Antibiotics:  None    Subjective: She relates she feels much better she is able to tolerate her diet. LFTs are trending down Objective: Filed Vitals:   02/27/12 1300 02/27/12 2129 02/28/12 0404 02/28/12 0901  BP: 137/82 101/66 110/69 135/70  Pulse: 74 78 88 89  Temp: 97.6 F (36.4 C) 98.2 F (36.8 C) 99.1 F (37.3 C)   TempSrc: Oral Oral Oral   Resp: 18 18 20    Height:      Weight:      SpO2: 97% 97% 96%     Intake/Output Summary (Last 24 hours) at 02/28/12 0957 Last data filed at 02/28/12 4034  Gross per 24 hour  Intake   1555 ml  Output      0 ml  Net   1555 ml   Weight change:   Exam:  General: Alert, awake, oriented x3, in no acute distress.  HEENT: No bruits, no goiter.  Heart: Regular  rate and rhythm, without murmurs, rubs, gallops.  Lungs: Good air movement clear to auscultation  Abdomen: Soft, some right upper quadrant tenderness some mild I would say tenderness with Murphy sign maneuver. Neuro: Grossly intact, nonfocal.   Data Reviewed: Basic Metabolic Panel:  Lab 02/26/12 7425 02/25/12 0201  NA 138 132*  K 3.5 3.1*  CL 100 90*  CO2 24 23  GLUCOSE 262* 433*  BUN 6 9  CREATININE 0.40* 0.50  CALCIUM 8.5 11.0*  MG -- --  PHOS -- --   Liver Function Tests:  Lab 02/27/12 1104 02/26/12 0445 02/25/12 0201  AST 308* 532* 338*  ALT 484* 536* 319*  ALKPHOS 115 108 139*  BILITOT 0.5 0.4 0.4  PROT 6.3 5.9* 7.3  ALBUMIN 3.3* 3.1* 3.9    Lab 02/25/12 0259  LIPASE 65*  AMYLASE --   No results found for this basename: AMMONIA:5 in the last 168 hours CBC:  Lab 02/26/12 0445 02/25/12 0201  WBC 4.3 5.3  NEUTROABS -- 3.3  HGB 12.7 14.9  HCT 38.1 42.8  MCV 86.2 84.6  PLT 123* 157   Cardiac Enzymes:  Lab 02/25/12 0754  CKTOTAL 44  CKMB 1.5  CKMBINDEX --  TROPONINI --   BNP: No components found with this basename: POCBNP:5 CBG:  Lab 02/28/12 0757 02/28/12 0024 02/27/12 2128 02/27/12 1705 02/27/12 1208  GLUCAP 166* 155* 134* 152* 192*    Recent Results (from the past 240 hour(s))  URINE CULTURE     Status: Normal   Collection Time   02/25/12  1:27 AM  Component Value Range Status Comment   Specimen Description URINE, CLEAN CATCH   Final    Special Requests NONE   Final    Culture  Setup Time [id number]   Final    Colony Count 10,000 COLONIES/ML   Final    Culture     Final    Value: Multiple bacterial morphotypes present, none predominant. Suggest appropriate recollection if clinically indicated.   Report Status 02/26/2012 FINAL   Final   CULTURE, BLOOD (ROUTINE X 2)     Status: Normal (Preliminary result)   Collection Time   02/25/12  4:03 AM      Component Value Range Status Comment   Specimen Description BLOOD RIGHT ARM   Final     Special Requests BOTTLES DRAWN AEROBIC AND ANAEROBIC 5CC EACH   Final    Culture  Setup Time [id number]   Final    Culture     Final    Value:        BLOOD CULTURE RECEIVED NO GROWTH TO DATE CULTURE WILL BE HELD FOR 5 DAYS BEFORE ISSUING A FINAL NEGATIVE REPORT   Report Status PENDING   Incomplete   CULTURE, BLOOD (ROUTINE X 2)     Status: Normal (Preliminary result)   Collection Time   02/25/12  4:15 AM      Component Value Range Status Comment   Specimen Description BLOOD LEFT ARM   Final    Special Requests BOTTLES DRAWN AEROBIC AND ANAEROBIC EACH   Final    Culture  Setup Time [id number]   Final    Culture     Final    Value:        BLOOD CULTURE RECEIVED NO GROWTH TO DATE CULTURE WILL BE HELD FOR 5 DAYS BEFORE ISSUING A FINAL NEGATIVE REPORT   Report Status PENDING   Incomplete   SURGICAL PCR SCREEN     Status: Normal   Collection Time   02/27/12  9:27 PM      Component Value Range Status Comment   MRSA, PCR NEGATIVE  NEGATIVE Final    Staphylococcus aureus NEGATIVE  NEGATIVE Final      Studies: Dg Chest 2 View  02/25/2012  *RADIOLOGY REPORT*  Clinical Data: Fever and abdominal pain.  CHEST - 2 VIEW  Comparison: 11/05/2010  Findings: Shallow inspiration. The heart size and pulmonary vascularity are normal. The lungs appear clear and expanded without focal air space disease or consolidation. No blunting of the costophrenic angles.  No significant changes since the previous study.  IMPRESSION: No evidence of active pulmonary disease.  Original Report Authenticated By: Marlon Pel, M.D.   US Abdomen Complete  02/25/2012  *RADIOLOGY REPORT*  Clinical Data:  Elevated liver function tests.  COMPLETE ABDOMINAL ULTRASOUND  Comparison:  CT of earlier today.  Findings:  Gallbladder:  Normal, without wall thickening, stone, or pericholecystic fluid.  Sonographic Murphy's sign was not elicited.  Common bile duct: Normal, 4 mm.  Liver: Moderately increased in echogenicity.  IVC:  Negative  Pancreas:  Negative  Spleen:  Normal in size and echogenicity.  Right Kidney:  13.7 cm. No hydronephrosis.  Left Kidney:  14.7 cm.  Apparent fullness within the lower pole left renal collecting system corresponds to sinus cyst on today's CT.  Abdominal aorta:  Nonaneurysmal without ascites.  IMPRESSION: Hepatic steatosis.  No other explanation for elevated liver function tests.  Original Report Authenticated By: Consuello Bossier, M.D.   Ct Abdomen Pelvis W Contrast  02/25/2012  *  RADIOLOGY REPORT*  Clinical Data: Fever, right flank pain, and history of kidney stones.  CT ABDOMEN AND PELVIS WITH CONTRAST  Technique:  Multidetector CT imaging of the abdomen and pelvis was performed following the standard protocol during bolus administration of intravenous contrast.  Contrast:  100 ml Omnipaque 300  Comparison: 10/07/2011  Findings: Slight fibrosis in the lung bases.  Mild low attenuation change throughout the liver suggesting diffuse fatty infiltration.  The gallbladder is contracted, likely normal in a nonfasting patient.  The pancreas, spleen, adrenal glands, abdominal aorta, and retroperitoneal lymph nodes are unremarkable. Small accessory spleens.  There are punctate stones in the left kidney, largest in the lower pole measuring 4 mm diameter.  These appear stable since the previous study.  Bilateral renal parapelvic cysts.  Sub centimeter parenchymal cysts.  No pyelocaliectasis or ureterectasis.  Nephrograms are otherwise homogeneous and normal. The stomach, small bowel, and colon are not abnormally distended. No free air or free fluid in the abdomen.  Pelvis:  The uterus and adnexal structures are not enlarged.  There is a small calcified fibroid arising from the posterior uterine fundus.  The bladder wall is not thickened.  The appendix is normal.  No free or loculated pelvic fluid collections.  No evidence of diverticulitis despite diverticula throughout the sigmoid and descending colon.  Normal  alignment of the lumbar vertebrae.  No significant changes since the previous study.  IMPRESSION: Nonobstructing left renal stones again demonstrated without change. Bilateral parapelvic and parenchymal cysts in the kidneys.  No focal inflammatory processes.  Diffuse fatty infiltration of the liver.  Original Report Authenticated By: Marlon Pel, M.D.   Dg Toe 2nd Left  02/13/2012  *RADIOLOGY REPORT*  Clinical Data: :  Diabetes.  Toe injury 1 month ago.  Swelling, reddening.  LEFT SECOND TOE  Comparison: 07/05/2004  Findings: There is soft tissue swelling of the digit.  There is irregularity of the distal aspect of the middle phalanx, there is a fracture of the base of the distal phalanx.  No radiopaque foreign body or soft tissue gas.  IMPRESSION:  1.  Fracture of the base of the distal phalanx second toe. 2.  Associated soft tissue swelling.  Original Report Authenticated By: Patterson Hammersmith, M.D.    Scheduled Meds:    . acetaminophen  1,000 mg Intravenous Once  .  ceFAZolin (ANCEF) IV  2 g Intravenous Once  . insulin aspart  0-15 Units Subcutaneous TID WC  . insulin aspart  0-5 Units Subcutaneous QHS  . insulin glargine  85 Units Subcutaneous QHS  . ketorolac  15 mg Intravenous Once  . LORazepam  0.5 mg Intravenous Once  . metoprolol succinate  100 mg Oral Daily  .  morphine injection  2 mg Intravenous Once  . sincalide  0.02 mcg/kg Intravenous Once  . DISCONTD: irbesartan  300 mg Oral Daily   Continuous Infusions:    . sodium chloride 100 mL/hr at 02/28/12 0004  . lactated ringers 20 mL/hr at 02/28/12 9604

## 2012-02-29 DIAGNOSIS — E1165 Type 2 diabetes mellitus with hyperglycemia: Secondary | ICD-10-CM

## 2012-02-29 DIAGNOSIS — K819 Cholecystitis, unspecified: Secondary | ICD-10-CM

## 2012-02-29 DIAGNOSIS — R7989 Other specified abnormal findings of blood chemistry: Secondary | ICD-10-CM

## 2012-02-29 DIAGNOSIS — K7581 Nonalcoholic steatohepatitis (NASH): Secondary | ICD-10-CM | POA: Diagnosis present

## 2012-02-29 DIAGNOSIS — R945 Abnormal results of liver function studies: Secondary | ICD-10-CM

## 2012-02-29 DIAGNOSIS — E118 Type 2 diabetes mellitus with unspecified complications: Secondary | ICD-10-CM

## 2012-02-29 LAB — COMPREHENSIVE METABOLIC PANEL
Alkaline Phosphatase: 104 U/L (ref 39–117)
BUN: 6 mg/dL (ref 6–23)
Chloride: 105 mEq/L (ref 96–112)
Creatinine, Ser: 0.39 mg/dL — ABNORMAL LOW (ref 0.50–1.10)
GFR calc Af Amer: >90 mL/min (ref >90)
GFR calc non Af Amer: >90 mL/min (ref >90)
Glucose, Bld: 170 mg/dL — ABNORMAL HIGH (ref 70–99)
Potassium: 3.7 mEq/L (ref 3.5–5.1)
Total Bilirubin: 0.4 mg/dL (ref 0.3–1.2)

## 2012-02-29 LAB — CBC
Platelets: 140 10*3/uL — ABNORMAL LOW (ref 150–400)
RDW: 13.9 % (ref 11.5–15.5)
WBC: 7.8 10*3/uL (ref 4.0–10.5)

## 2012-02-29 MED ORDER — OXYCODONE HCL 5 MG PO TABS: 5.0000 mg | ORAL_TABLET | ORAL | Status: AC | PRN

## 2012-02-29 MED ORDER — INSULIN GLARGINE 100 UNIT/ML ~~LOC~~ SOLN: 75.0000 [IU] | Freq: Every day | SUBCUTANEOUS | Status: DC

## 2012-02-29 MED ORDER — INSULIN GLARGINE 100 UNIT/ML ~~LOC~~ SOLN: 85.0000 [IU] | Freq: Every day | SUBCUTANEOUS | Status: DC

## 2012-02-29 NOTE — Progress Notes (Signed)
Nsg Discharge Note  Admit Date:  02/25/2012 Discharge date: 02/29/2012   Courtney Parks to be D/C'd Home per MD order.  AVS completed.  Copy for chart, and copy for patient signed, and dated. Patient/caregiver able to verbalize understanding.  Discharge Medication:  Courtney Parks, Courtney Parks  Home Medication Instructions [id number]   Printed on:02/29/12 1031  Medication Information                    hydrochlorothiazide (HYDRODIURIL) 25 MG tablet Take 25 mg by mouth daily.           valsartan (DIOVAN) 320 MG tablet Take 320 mg by mouth daily.           metoprolol succinate (TOPROL-XL) 100 MG 24 hr tablet Take 100 mg by mouth daily. Take with or immediately following a meal.           metFORMIN (GLUCOPHAGE) 1000 MG tablet Take 1,000 mg by mouth 2 (two) times daily with a meal.           KRILL OIL PO Take 1 capsule by mouth daily.           CINNAMON PO Take 2 capsules by mouth daily. Purchases over the counter           ibuprofen (ADVIL,MOTRIN) 200 MG tablet Take 600-800 mg by mouth 2 (two) times daily as needed. pain           Lutein 20 MG TABS Take 20 mg by mouth daily. Over the counter medication           insulin lispro (HUMALOG) 100 UNIT/ML injection Inject 20 Units into the skin daily.           oxyCODONE (OXY IR/ROXICODONE) 5 MG immediate release tablet Take 1-2 tablets (5-10 mg total) by mouth every 4 (four) hours as needed for pain.           insulin glargine (LANTUS) 100 UNIT/ML injection Inject 85 Units into the skin at bedtime.             Discharge Assessment: Filed Vitals:   02/29/12 0634  BP: 117/68  Pulse: 83  Temp: 98.3 F (36.8 C)  Resp: 20   Skin clean, dry, with 4 lap sites with DDI. No evidence of skin break down, no evidence of skin tears noted. IV catheter discontinued intact. Site without signs and symptoms of complications - no redness or edema noted at insertion site, patient denies c/o pain - only slight tenderness at site.  Dressing with slight  pressure applied.  D/c Instructions-Education: Discharge instructions given to patient/family with verbalized understanding. D/c education completed with patient/family including follow up instructions, medication list, d/c activities limitations if indicated, with other d/c instructions as indicated by MD - patient able to verbalize understanding, all questions fully answered. Patient instructed to return to ED, call 911, or call MD for any changes in condition.  Patient escorted via WC, and D/C home via private auto.  Eleesha Purkey Consuella Lose, RN 02/29/2012 10:31 AM

## 2012-02-29 NOTE — Discharge Instructions (Signed)

## 2012-02-29 NOTE — Progress Notes (Signed)
Courtney Parks [id number] 02-09-1961  CARE TEAM:  PCP: Margaree Mackintosh, MD  Outpatient Care Team: Patient Care Team: Margaree Mackintosh, MD as PCP - General (Internal Medicine)  Inpatient Treatment Team: Treatment Team: Attending Provider: Marinda Elk, MD; Rounding Team: Simon Rhein, MD; Registered Nurse: Jerene Dilling, RN; Technician: Sela Hilding, NT; Consulting Physician: Bishop Limbo, MD; Registered Nurse: Druscilla Brownie, RN; Respiratory Therapist: Cloyd Stagers, RT; Respiratory Therapist: Toula Moos, RRT  Subjective:  Sitting up in bed Pain controlled Walking in hallways Tol PO   Objective:  Vital signs:  Filed Vitals:   02/28/12 1431 02/28/12 1634 02/28/12 2237 02/29/12 0634  BP: 134/83  108/69 117/68  Pulse: 82  67 83  Temp: 97.7 F (36.5 C)  98 F (36.7 C) 98.3 F (36.8 C)  TempSrc: Oral  Oral Oral  Resp: 16  18 20   Height:      Weight:      SpO2: 88% 94% 93% 92%    Last BM Date: 02/27/12  Intake/Output   Yesterday:  06/28 0701 - 06/29 0700 In: 2461.3 [P.O.:240; I.V.:2171.3; IV Piggyback:50] Out: -  This shift:     Bowel function:  Flatus: y  BM: n  Physical Exam:  General: Pt awake/alert/oriented x4 in no acute distress Eyes: PERRL, normal EOM.  Sclera clear.  No icterus Neuro: CN II-XII intact w/o focal sensory/motor deficits. Lymph: No head/neck/groin lymphadenopathy Psych:  No delerium/psychosis/paranoia HENT: Normocephalic, Mucus membranes moist.  No thrush Neck: Supple, No tracheal deviation Chest: No chest wall pain w good excursion CV:  Pulses intact.  Regular rhythm Abdomen: Soft.  Nondistended.  Mildly tender at incisions only.  No incarcerated hernias. Ext:  SCDs BLE.  No mjr edema.  No cyanosis Skin: No petechiae / purpurae  Results:   Labs: Results for orders placed during the hospital encounter of 02/25/12 (from the past 48 hour(s))  GLUCOSE, CAPILLARY     Status: Abnormal   Collection Time   02/27/12  8:43 AM      Component Value Range Comment   Glucose-Capillary 233 (*) 70 - 99 mg/dL    Comment 1 Documented in Chart      Comment 2 Notify RN     HEPATIC FUNCTION PANEL     Status: Abnormal   Collection Time   02/27/12 11:04 AM      Component Value Range Comment   Total Protein 6.3  6.0 - 8.3 g/dL    Albumin 3.3 (*) 3.5 - 5.2 g/dL    AST 119 (*) 0 - 37 U/L    ALT 484 (*) 0 - 35 U/L    Alkaline Phosphatase 115  39 - 117 U/L    Total Bilirubin 0.5  0.3 - 1.2 mg/dL    Bilirubin, Direct 0.2  0.0 - 0.3 mg/dL    Indirect Bilirubin 0.3  0.3 - 0.9 mg/dL   GLUCOSE, CAPILLARY     Status: Abnormal   Collection Time   02/27/12 12:08 PM      Component Value Range Comment   Glucose-Capillary 192 (*) 70 - 99 mg/dL    Comment 1 Documented in Chart      Comment 2 Notify RN     GLUCOSE, CAPILLARY     Status: Abnormal   Collection Time   02/27/12  5:05 PM      Component Value Range Comment   Glucose-Capillary 152 (*) 70 - 99 mg/dL   SURGICAL PCR SCREEN     Status:  Normal   Collection Time   02/27/12  9:27 PM      Component Value Range Comment   MRSA, PCR NEGATIVE  NEGATIVE    Staphylococcus aureus NEGATIVE  NEGATIVE   GLUCOSE, CAPILLARY     Status: Abnormal   Collection Time   02/27/12  9:28 PM      Component Value Range Comment   Glucose-Capillary 134 (*) 70 - 99 mg/dL    Comment 1 Documented in Chart      Comment 2 Notify RN     GLUCOSE, CAPILLARY     Status: Abnormal   Collection Time   02/28/12 12:24 AM      Component Value Range Comment   Glucose-Capillary 155 (*) 70 - 99 mg/dL   GLUCOSE, CAPILLARY     Status: Abnormal   Collection Time   02/28/12  7:57 AM      Component Value Range Comment   Glucose-Capillary 166 (*) 70 - 99 mg/dL   GLUCOSE, CAPILLARY     Status: Abnormal   Collection Time   02/28/12 12:25 PM      Component Value Range Comment   Glucose-Capillary 201 (*) 70 - 99 mg/dL   GLUCOSE, CAPILLARY     Status: Abnormal   Collection Time   02/28/12  5:14 PM       Component Value Range Comment   Glucose-Capillary 208 (*) 70 - 99 mg/dL   GLUCOSE, CAPILLARY     Status: Abnormal   Collection Time   02/28/12  6:23 PM      Component Value Range Comment   Glucose-Capillary 221 (*) 70 - 99 mg/dL   GLUCOSE, CAPILLARY     Status: Abnormal   Collection Time   02/28/12 10:28 PM      Component Value Range Comment   Glucose-Capillary 228 (*) 70 - 99 mg/dL    Comment 1 Notify RN      Comment 2 Documented in Chart     COMPREHENSIVE METABOLIC PANEL     Status: Abnormal   Collection Time   02/29/12  5:06 AM      Component Value Range Comment   Sodium 139  135 - 145 mEq/L    Potassium 3.7  3.5 - 5.1 mEq/L    Chloride 105  96 - 112 mEq/L    CO2 23  19 - 32 mEq/L    Glucose, Bld 170 (*) 70 - 99 mg/dL    BUN 6  6 - 23 mg/dL    Creatinine, Ser 7.82 (*) 0.50 - 1.10 mg/dL    Calcium 7.9 (*) 8.4 - 10.5 mg/dL    Total Protein 5.7 (*) 6.0 - 8.3 g/dL    Albumin 2.8 (*) 3.5 - 5.2 g/dL    AST 90 (*) 0 - 37 U/L    ALT 236 (*) 0 - 35 U/L    Alkaline Phosphatase 104  39 - 117 U/L    Total Bilirubin 0.4  0.3 - 1.2 mg/dL    GFR calc non Af Amer >90  >90 mL/min    GFR calc Af Amer >90  >90 mL/min   CBC     Status: Abnormal   Collection Time   02/29/12  5:06 AM      Component Value Range Comment   WBC 7.8  4.0 - 10.5 K/uL    RBC 3.80 (*) 3.87 - 5.11 MIL/uL    Hemoglobin 10.8 (*) 12.0 - 15.0 g/dL    HCT 95.6 (*) 21.3 - 46.0 %  MCV 86.6  78.0 - 100.0 fL    MCH 28.4  26.0 - 34.0 pg    MCHC 32.8  30.0 - 36.0 g/dL    RDW 45.4  09.8 - 11.9 %    Platelets 140 (*) 150 - 400 K/uL     Imaging / Studies: No results found.  Medications / Allergies: per chart  Antibiotics: Anti-infectives     Start     Dose/Rate Route Frequency Ordered Stop   02/28/12 1500   ceFAZolin (ANCEF) IVPB 1 g/50 mL premix        1 g 100 mL/hr over 30 Minutes Intravenous Every 6 hours 02/28/12 1331 02/29/12 0406   02/27/12 1345   ceFAZolin (ANCEF) IVPB 2 g/50 mL premix  Status:  Discontinued         2 g 100 mL/hr over 30 Minutes Intravenous  Once 02/27/12 1319 02/28/12 1331   02/25/12 0800   piperacillin-tazobactam (ZOSYN) IVPB 3.375 g  Status:  Discontinued        3.375 g 12.5 mL/hr over 240 Minutes Intravenous 3 times per day 02/25/12 1478 02/25/12 1131          Problem List:  Principal Problem:  *Cholecystitis, acute Active Problems:  Insulin dependent diabetes mellitus  Hypertension  Steatohepatitis, nonalcoholic   Assessment  Courtney Parks  51 y.o. 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  Do not push through pain.  Advanced on diet as tolerated. Bowel regimen to avoid problems.  Return to clinic ~3weeks. The patient expressed understanding and appreciation  D/w Dr. Robb Matar w IM whom agrees safe to D/C  -DM control -VTE prophylaxis- SCDs, etc -mobilize as tolerated to help recovery  Ardeth Sportsman, M.D., F.A.C.S. Gastrointestinal [facility]ally Invasive [facility] [address] [place], [place] [address] [phone] Main / Paging [phone] Voice Mail   02/29/2012

## 2012-02-29 NOTE — Discharge Summary (Signed)
Physician Discharge Summary  Courtney Parks AVW:[id number] DOB: 1960-09-12 DOA: 02/25/2012  PCP: Margaree Mackintosh, MD  Admit date: 02/25/2012 Discharge date: 02/29/2012  Discharge Diagnoses:  Principal Problem:  *Cholecystitis, acute Active Problems:  Insulin dependent diabetes mellitus  Hypertension  Steatohepatitis, nonalcoholic   Discharge Condition: Stable  Disposition:  Follow-up Information    Follow up with TSUEI,MATTHEW K., MD. Schedule an appointment as soon as possible for a visit in 3 weeks.   Contact information:   [facility] [address], [address] [place] [address] [phone]          Diet: Low-fat diet  History of present illness:  51 yo female with hypertension, Dm2, nephrolithiaisis, recent uti, Apparently had temp to 102 last nite and therefore presented to the ED, Pt also noted right sided/flank pain, and nausea, but no emesis. Also notes slight cough with clear sputum. Denies cp, palp, sob, diarrhea, constipation, brbpr, black stool.   Hospital Course:  Principal Problem: Cholecystitis, acute: -She had elevated LFTs and lipase on admission complaining of right upper quadrant pain and fevers. She was originally started on Rocephin for possible UTI. Ultrasound of the abdomen was done with results of as below. CT scan was done that showed no stone. HIDA scan was done that showed an ejection fraction of the gallbladder of 8% GI was consulted they recommended to get surgery involved for the possibility of a calculus cholecystitis. Surgery was consulted she had laparoscopic cholecystectomy on 02/28/2012. She was started on a diet was passing gas she was discharged in stable condition. She will continue low-fat diet.  Insulin dependent diabetes mellitus -She was started on her home dose regimen her her blood glucose tended to be high so her Lantus was increased. Her metformin was stopped for surgery then it was resumed. She will  go home on sliding scale insulin, metformin and a higher dose of Lantus. She'll continue check CBGs a.c. and at bedtime she'll follow up with her primary care Dr. to titrate her insulin as tolerated.  Hypertension -Her blood pressure remained stable no changes were made.   Discharge Exam: Filed Vitals:   02/29/12 0634  BP: 117/68  Pulse: 83  Temp: 98.3 F (36.8 C)  Resp: 20   Filed Vitals:   02/28/12 1431 02/28/12 1634 02/28/12 2237 02/29/12 0634  BP: 134/83  108/69 117/68  Pulse: 82  67 83  Temp: 97.7 F (36.5 C)  98 F (36.7 C) 98.3 F (36.8 C)  TempSrc: Oral  Oral Oral  Resp: 16  18 20   Height:      Weight:      SpO2: 88% 94% 93% 92%   General: Awake alert and oriented with no complaints. Cardiovascular: Regular rate and rhythm Respiratory: Good air movement clear to auscultation crackles are cleared with cough  Discharge Instructions  Discharge Orders    Future Appointments: Provider: Department: Dept Phone: Center:   03/13/2012 9:05 AM Margaree Mackintosh, MD [facility] [phone] [facility]   03/17/2012 4:30 PM Margaree Mackintosh, MD [facility] [phone] [facility]     Future Orders Please Complete By Expires   Diet - low sodium heart healthy      Increase activity slowly        Medication List  As of 02/29/2012  8:24 AM   TAKE these medications         CINNAMON PO   Take 2 capsules by mouth daily. Purchases over the counter  hydrochlorothiazide 25 MG tablet   Commonly known as: HYDRODIURIL   Take 25 mg by mouth daily.      ibuprofen 200 MG tablet   Commonly known as: ADVIL,MOTRIN   Take 600-800 mg by mouth 2 (two) times daily as needed. pain      insulin glargine 100 UNIT/ML injection   Commonly known as: LANTUS   Inject 85 Units into the skin at bedtime.      insulin lispro 100 UNIT/ML injection   Commonly known as: HUMALOG   Inject 20 Units into the skin daily.      KRILL OIL PO   Take 1 capsule by mouth daily.      Lutein 20 MG Tabs    Take 20 mg by mouth daily. Over the counter medication      metFORMIN 1000 MG tablet   Commonly known as: GLUCOPHAGE   Take 1,000 mg by mouth 2 (two) times daily with a meal.      metoprolol succinate 100 MG 24 hr tablet   Commonly known as: TOPROL-XL   Take 100 mg by mouth daily. Take with or immediately following a meal.      oxyCODONE 5 MG immediate release tablet   Commonly known as: Oxy IR/ROXICODONE   Take 1-2 tablets (5-10 mg total) by mouth every 4 (four) hours as needed for pain.      valsartan 320 MG tablet   Commonly known as: DIOVAN   Take 320 mg by mouth daily.              The results of significant diagnostics from this hospitalization (including imaging, microbiology, ancillary and laboratory) are listed below for reference.    Significant Diagnostic Studies: Dg Chest 2 View  02/25/2012  *RADIOLOGY REPORT*  Clinical Data: Fever and abdominal pain.  CHEST - 2 VIEW  Comparison: 11/05/2010  Findings: Shallow inspiration. The heart size and pulmonary vascularity are normal. The lungs appear clear and expanded without focal air space disease or consolidation. No blunting of the costophrenic angles.  No significant changes since the previous study.  IMPRESSION: No evidence of active pulmonary disease.  Original Report Authenticated By: Marlon Pel, M.D.   Nm Hepatobiliary Liver Func  02/26/2012  *RADIOLOGY REPORT*  Clinical Data: History of abdominal pain and elevated liver function tests.  NUCLEAR MEDICINE HEPATOBILIARY IMAGING WITH GALLBLADDER EF  Technique: Sequential images of the abdomen were obtained out to 60 minutes following intravenous administration of radiopharmaceutical. After slow intravenous infusion of 2.18 uCg Cholecystokinin, gallbladder ejection fraction was determined.  Radiopharmaceutical: 5 mCi Tc-67m Choletec  Comparison: Ultrasound 02/25/2012.  Findings: There is prompt visualization of hepatic activity. There is prompt visualization of the  common bile duct. Subsequently intestinal activity was identified.  The gallbladder began to visualize at 17 minutes.  During CCK infusion the gallbladder contracted 8.1%.  This is abnormally low. 30% or greater is normal range.  During CCK infusion the patient reported  experiencing mild pressure sensation in the right upper quadrant.  IMPRESSION: There is demonstration of patency of the common bile duct and the cystic duct. There is no evidence of cholecystitis.  During CCK infusion the gallbladder contracted 8.1%.  This is abnormally low. 30% or greater is normal range.  During CCK infusion the patient reported  experiencing mild pressure sensation in the right upper quadrant.  Original Report Authenticated By: Crawford Givens, M.D.   US Abdomen Complete  02/25/2012  *RADIOLOGY REPORT*  Clinical Data:  Elevated liver function tests.  COMPLETE ABDOMINAL ULTRASOUND  Comparison:  CT of earlier today.  Findings:  Gallbladder:  Normal, without wall thickening, stone, or pericholecystic fluid.  Sonographic Murphy's sign was not elicited.  Common bile duct: Normal, 4 mm.  Liver: Moderately increased in echogenicity.  IVC: Negative  Pancreas:  Negative  Spleen:  Normal in size and echogenicity.  Right Kidney:  13.7 cm. No hydronephrosis.  Left Kidney:  14.7 cm.  Apparent fullness within the lower pole left renal collecting system corresponds to sinus cyst on today's CT.  Abdominal aorta:  Nonaneurysmal without ascites.  IMPRESSION: Hepatic steatosis.  No other explanation for elevated liver function tests.  Original Report Authenticated By: Consuello Bossier, M.D.   Ct Abdomen Pelvis W Contrast  02/25/2012  *RADIOLOGY REPORT*  Clinical Data: Fever, right flank pain, and history of kidney stones.  CT ABDOMEN AND PELVIS WITH CONTRAST  Technique:  Multidetector CT imaging of the abdomen and pelvis was performed following the standard protocol during bolus administration of intravenous contrast.  Contrast:  100 ml Omnipaque  300  Comparison: 10/07/2011  Findings: Slight fibrosis in the lung bases.  Mild low attenuation change throughout the liver suggesting diffuse fatty infiltration.  The gallbladder is contracted, likely normal in a nonfasting patient.  The pancreas, spleen, adrenal glands, abdominal aorta, and retroperitoneal lymph nodes are unremarkable. Small accessory spleens.  There are punctate stones in the left kidney, largest in the lower pole measuring 4 mm diameter.  These appear stable since the previous study.  Bilateral renal parapelvic cysts.  Sub centimeter parenchymal cysts.  No pyelocaliectasis or ureterectasis.  Nephrograms are otherwise homogeneous and normal. The stomach, small bowel, and colon are not abnormally distended. No free air or free fluid in the abdomen.  Pelvis:  The uterus and adnexal structures are not enlarged.  There is a small calcified fibroid arising from the posterior uterine fundus.  The bladder wall is not thickened.  The appendix is normal.  No free or loculated pelvic fluid collections.  No evidence of diverticulitis despite diverticula throughout the sigmoid and descending colon.  Normal alignment of the lumbar vertebrae.  No significant changes since the previous study.  IMPRESSION: Nonobstructing left renal stones again demonstrated without change. Bilateral parapelvic and parenchymal cysts in the kidneys.  No focal inflammatory processes.  Diffuse fatty infiltration of the liver.  Original Report Authenticated By: Marlon Pel, M.D.   Dg Toe 2nd Left  02/13/2012  *RADIOLOGY REPORT*  Clinical Data: :  Diabetes.  Toe injury 1 month ago.  Swelling, reddening.  LEFT SECOND TOE  Comparison: 07/05/2004  Findings: There is soft tissue swelling of the digit.  There is irregularity of the distal aspect of the middle phalanx, there is a fracture of the base of the distal phalanx.  No radiopaque foreign body or soft tissue gas.  IMPRESSION:  1.  Fracture of the base of the distal phalanx  second toe. 2.  Associated soft tissue swelling.  Original Report Authenticated By: Patterson Hammersmith, M.D.    Microbiology: Recent Results (from the past 240 hour(s))  URINE CULTURE     Status: Normal   Collection Time   02/25/12  1:27 AM      Component Value Range Status Comment   Specimen Description URINE, CLEAN CATCH   Final    Special Requests NONE   Final    Culture  Setup Time [id number]   Final    Colony Count 10,000 COLONIES/ML   Final    Culture  Final    Value: Multiple bacterial morphotypes present, none predominant. Suggest appropriate recollection if clinically indicated.   Report Status 02/26/2012 FINAL   Final   CULTURE, BLOOD (ROUTINE X 2)     Status: Normal (Preliminary result)   Collection Time   02/25/12  4:03 AM      Component Value Range Status Comment   Specimen Description BLOOD RIGHT ARM   Final    Special Requests BOTTLES DRAWN AEROBIC AND ANAEROBIC 5CC EACH   Final    Culture  Setup Time [id number]   Final    Culture     Final    Value:        BLOOD CULTURE RECEIVED NO GROWTH TO DATE CULTURE WILL BE HELD FOR 5 DAYS BEFORE ISSUING A FINAL NEGATIVE REPORT   Report Status PENDING   Incomplete   CULTURE, BLOOD (ROUTINE X 2)     Status: Normal (Preliminary result)   Collection Time   02/25/12  4:15 AM      Component Value Range Status Comment   Specimen Description BLOOD LEFT ARM   Final    Special Requests BOTTLES DRAWN AEROBIC AND ANAEROBIC EACH   Final    Culture  Setup Time [id number]   Final    Culture     Final    Value:        BLOOD CULTURE RECEIVED NO GROWTH TO DATE CULTURE WILL BE HELD FOR 5 DAYS BEFORE ISSUING A FINAL NEGATIVE REPORT   Report Status PENDING   Incomplete   SURGICAL PCR SCREEN     Status: Normal   Collection Time   02/27/12  9:27 PM      Component Value Range Status Comment   MRSA, PCR NEGATIVE  NEGATIVE Final    Staphylococcus aureus NEGATIVE  NEGATIVE Final      Labs: Basic Metabolic Panel:  Lab 02/29/12 3244  02/26/12 0445 02/25/12 0201  NA 139 138 132*  K 3.7 3.5 --  CL 105 100 90*  CO2 23 24 23   GLUCOSE 170* 262* 433*  BUN 6 6 9   CREATININE 0.39* 0.40* 0.50  CALCIUM 7.9* 8.5 11.0*  MG -- -- --  PHOS -- -- --   Liver Function Tests:  Lab 02/29/12 0506 02/27/12 1104 02/26/12 0445 02/25/12 0201  AST 90* 308* 532* 338*  ALT 236* 484* 536* 319*  ALKPHOS 104 115 108 139*  BILITOT 0.4 0.5 0.4 0.4  PROT 5.7* 6.3 5.9* 7.3  ALBUMIN 2.8* 3.3* 3.1* 3.9    Lab 02/25/12 0259  LIPASE 65*  AMYLASE --   No results found for this basename: AMMONIA:5 in the last 168 hours CBC:  Lab 02/29/12 0506 02/26/12 0445 02/25/12 0201  WBC 7.8 4.3 5.3  NEUTROABS -- -- 3.3  HGB 10.8* 12.7 14.9  HCT 32.9* 38.1 42.8  MCV 86.6 86.2 84.6  PLT 140* 123* 157   Cardiac Enzymes:  Lab 02/25/12 0754  CKTOTAL 44  CKMB 1.5  CKMBINDEX --  TROPONINI --   BNP: No components found with this basename: POCBNP:5 CBG:  Lab 02/28/12 2228 02/28/12 1823 02/28/12 1714 02/28/12 1225 02/28/12 0757  GLUCAP 228* 221* 208* 201* 166*    Time coordinating discharge: Greater than 35 minutes  Signed:  Marinda Elk  [facility] 02/29/2012, 8:24 AM

## 2012-03-02 ENCOUNTER — Encounter (HOSPITAL_COMMUNITY): Payer: Self-pay | Admitting: Surgery

## 2012-03-02 ENCOUNTER — Telehealth (INDEPENDENT_AMBULATORY_CARE_PROVIDER_SITE_OTHER): Payer: Self-pay | Admitting: General Surgery

## 2012-03-02 LAB — CULTURE, BLOOD (ROUTINE X 2): Culture: NO GROWTH

## 2012-03-02 NOTE — Telephone Encounter (Signed)
Pt calling to report she has had nausea and vomiting (now resolved) but has developed diarrhea this morning and severe headache.  She is sipping Gatorade.  Pt states she took Dramamine yesterday and has not vomited since late last night.  Diarrhea started this morning.  Advised pt to continue drinking the Gatorade to replace her lost fluids, as this will also help resolve her headache.  OK to take Immodium and follow directions on box label.  Suggested she contact Dr. Lenord Fellers, her PCP for advice as well, especially since she is IDDM.  Pt understands and will comply.

## 2012-03-13 ENCOUNTER — Other Ambulatory Visit: Payer: 59 | Admitting: Internal Medicine

## 2012-03-13 DIAGNOSIS — E785 Hyperlipidemia, unspecified: Secondary | ICD-10-CM

## 2012-03-13 DIAGNOSIS — Z79899 Other long term (current) drug therapy: Secondary | ICD-10-CM

## 2012-03-13 LAB — HEPATIC FUNCTION PANEL
AST: 57 U/L — ABNORMAL HIGH (ref 0–37)
Albumin: 4.5 g/dL (ref 3.5–5.2)
Alkaline Phosphatase: 92 U/L (ref 39–117)
Total Protein: 7.2 g/dL (ref 6.0–8.3)

## 2012-03-13 LAB — LIPID PANEL
HDL: 41 mg/dL (ref >39)
Triglycerides: 224 mg/dL — ABNORMAL HIGH (ref <150)

## 2012-03-17 ENCOUNTER — Ambulatory Visit: Payer: 59 | Admitting: Internal Medicine

## 2012-03-17 ENCOUNTER — Ambulatory Visit (INDEPENDENT_AMBULATORY_CARE_PROVIDER_SITE_OTHER): Payer: 59 | Admitting: Internal Medicine

## 2012-03-17 ENCOUNTER — Encounter: Payer: Self-pay | Admitting: Internal Medicine

## 2012-03-17 VITALS — BP 106/72 | HR 76 | Temp 98.4°F | Wt 229.0 lb

## 2012-03-17 DIAGNOSIS — Z9889 Other specified postprocedural states: Secondary | ICD-10-CM

## 2012-03-17 DIAGNOSIS — E119 Type 2 diabetes mellitus without complications: Secondary | ICD-10-CM

## 2012-03-17 DIAGNOSIS — I10 Essential (primary) hypertension: Secondary | ICD-10-CM

## 2012-03-17 DIAGNOSIS — K859 Acute pancreatitis without necrosis or infection, unspecified: Secondary | ICD-10-CM

## 2012-03-17 DIAGNOSIS — E785 Hyperlipidemia, unspecified: Secondary | ICD-10-CM

## 2012-03-17 DIAGNOSIS — Z87442 Personal history of urinary calculi: Secondary | ICD-10-CM

## 2012-03-17 DIAGNOSIS — Z9049 Acquired absence of other specified parts of digestive tract: Secondary | ICD-10-CM

## 2012-03-25 ENCOUNTER — Encounter (INDEPENDENT_AMBULATORY_CARE_PROVIDER_SITE_OTHER): Payer: Self-pay | Admitting: Surgery

## 2012-03-25 ENCOUNTER — Ambulatory Visit (INDEPENDENT_AMBULATORY_CARE_PROVIDER_SITE_OTHER): Payer: 59 | Admitting: Surgery

## 2012-03-25 ENCOUNTER — Telehealth (INDEPENDENT_AMBULATORY_CARE_PROVIDER_SITE_OTHER): Payer: Self-pay | Admitting: General Surgery

## 2012-03-25 VITALS — BP 120/84 | HR 101 | Temp 97.9°F | Ht 66.0 in | Wt 229.0 lb

## 2012-03-25 DIAGNOSIS — K810 Acute cholecystitis: Secondary | ICD-10-CM

## 2012-03-25 MED ORDER — PANTOPRAZOLE SODIUM 20 MG PO TBEC: 40.0000 mg | DELAYED_RELEASE_TABLET | Freq: Every day | ORAL | Status: DC

## 2012-03-25 NOTE — Progress Notes (Signed)
S/p lap chole with IOC on 6/28 for acute cholecystitis and pancreatitis.  The patient is doing quite well.  Incisions are healed with no sign of infection.  She is having some post-prandial reflux symptoms, so we will start some Protonix.  She may resume full activity.  Follow-up PRN.  She may need to see Dr. Juanda Chance, her GI, if the symptoms persist.   Wilmon Arms. Corliss Skains, MD, [facility] Surgery  03/25/2012 11:11 AM

## 2012-03-25 NOTE — Telephone Encounter (Signed)
Called pt to let her know to get OTC meds

## 2012-03-29 ENCOUNTER — Encounter: Payer: Self-pay | Admitting: Internal Medicine

## 2012-03-29 DIAGNOSIS — Z9049 Acquired absence of other specified parts of digestive tract: Secondary | ICD-10-CM | POA: Insufficient documentation

## 2012-03-29 NOTE — Patient Instructions (Addendum)
Please try to watch her diabetic control. Try the diet exercise and lose weight. Return in 3-6 months for further evaluation. Continue to see endocrinologist.

## 2012-03-29 NOTE — Progress Notes (Signed)
  Subjective:    Patient ID: Courtney Parks, female    DOB: [birth year], 51 y.o.   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 Has not wanted to take medication for hyperlipidemia. Recently was hospitalized here with pancreatitis and  cholecystitis. She became ill while visiting her brother out of state. She underwent a laparoscopic cholecystectomy by Dr. Corliss Skains. She's here today for followup on medical problems. She has a history of kidney stones. She's feeling that better now having recovered from her surgery. Getting her strength back. Says diabetes control is been "fairly good ". Dr. Sharl Ma is her endocrinologist.  Recent lab work shows total cholesterol to be 238, triglycerides 224 and LDL cholesterol 152. She does not want to take statin medication. While hospitalized she had significant elevations of SGOT and SGPT in the 500 range. Now SGOT is 57 and SGPT is 64. Hemoglobin A1c was 11.3% 3 months ago and is now 10.5%.    Review of Systems     Objective:   Physical Exam Neck is supple without thyromegaly JVD or carotid bruits; chest clear to auscultation; cardiac exam regular rate and rhythm normal S1 and S2; extremities without edema. Pulses are intact. Diabetic foot exam without ulcers. Skin is pale warm and dry.       Assessment & Plan:  Status post recent laparoscopic cholecystectomy  Status post recent bout of pancreatitis due to biliary obstruction  Elevated liver functions do to cholecystitis and biliary obstruction  Insulin-dependent diabetes mellitus  Hyperlipidemia  Hypertension  History of kidney stones  Plan: Patient should return in 3-6 months for repeat lipid panel liver functions and hemoglobin A1c and should continue to see Dr. Sharl Ma  regarding diabetes management. Hopefully now that she's feeling better she can work on some of these  medical problems, diet and exercise. I would still encourage her to think about statin therapy

## 2012-05-25 ENCOUNTER — Other Ambulatory Visit: Payer: Self-pay

## 2012-05-25 MED ORDER — METOPROLOL SUCCINATE ER 100 MG PO TB24: 100.0000 mg | ORAL_TABLET | Freq: Every day | ORAL | Status: DC

## 2012-06-11 ENCOUNTER — Other Ambulatory Visit: Payer: 59 | Admitting: Internal Medicine

## 2012-06-12 ENCOUNTER — Ambulatory Visit: Payer: 59 | Admitting: Internal Medicine

## 2012-06-15 ENCOUNTER — Encounter: Payer: Self-pay | Admitting: Internal Medicine

## 2012-07-14 ENCOUNTER — Other Ambulatory Visit: Payer: Self-pay

## 2012-07-14 MED ORDER — HYDROCHLOROTHIAZIDE 25 MG PO TABS: 25.0000 mg | ORAL_TABLET | Freq: Every day | ORAL | Status: DC

## 2012-08-13 ENCOUNTER — Other Ambulatory Visit: Payer: Self-pay | Admitting: Internal Medicine

## 2012-08-19 ENCOUNTER — Other Ambulatory Visit: Payer: Self-pay

## 2012-08-19 MED ORDER — HYDROCHLOROTHIAZIDE 25 MG PO TABS: 25.0000 mg | ORAL_TABLET | Freq: Every day | ORAL | Status: DC

## 2012-08-19 MED ORDER — VALSARTAN 320 MG PO TABS: 320.0000 mg | ORAL_TABLET | Freq: Every day | ORAL | Status: DC

## 2012-10-17 ENCOUNTER — Other Ambulatory Visit: Payer: Self-pay | Admitting: Internal Medicine

## 2012-10-18 NOTE — Telephone Encounter (Signed)
Duplicate orders please contact pharmacy to clear up.

## 2012-10-19 ENCOUNTER — Other Ambulatory Visit: Payer: Self-pay

## 2012-12-10 ENCOUNTER — Other Ambulatory Visit: Payer: Self-pay | Admitting: Internal Medicine

## 2012-12-16 ENCOUNTER — Other Ambulatory Visit: Payer: Self-pay

## 2012-12-16 MED ORDER — METOPROLOL SUCCINATE ER 100 MG PO TB24: 100.0000 mg | ORAL_TABLET | Freq: Every day | ORAL | Status: DC

## 2013-01-08 ENCOUNTER — Ambulatory Visit (INDEPENDENT_AMBULATORY_CARE_PROVIDER_SITE_OTHER): Payer: 59 | Admitting: Internal Medicine

## 2013-01-08 ENCOUNTER — Encounter: Payer: Self-pay | Admitting: Internal Medicine

## 2013-01-08 VITALS — BP 126/80 | Temp 98.9°F | Wt 244.0 lb

## 2013-01-08 DIAGNOSIS — R300 Dysuria: Secondary | ICD-10-CM

## 2013-01-08 LAB — POCT URINALYSIS DIPSTICK
Ketones, UA: NEGATIVE
Protein, UA: NEGATIVE
Spec Grav, UA: 1.01
pH, UA: 6.5

## 2013-01-08 NOTE — Patient Instructions (Addendum)
Take Levaquin 500 milligrams daily for 7 days. Culture is pending.

## 2013-01-08 NOTE — Progress Notes (Signed)
  Subjective:    Patient ID: Courtney Parks, female    DOB: 11-20-1960, 52 y.o.   MRN: [id number]  HPI Long-standing history of recurrent kidney stones. Was seen here June 2013 with urinary tract infection. Culture grew greater than 100,000 CoL/mL Escherichia coli sensitive to quinolone. Was treated with Cipro for 10 days. Patient now has right flank pain. Does not feel like she has kidney stone. Has suprapubic pressure and dysuria. Had onset of symptoms yesterday. Says urine has a slight odor and she is fairly certain she is getting another urinary tract infection. Dr. Normajean Baxter is her urologist    Review of Systems     Objective:   Physical Exam No CVA tenderness. Urinalysis is normal. Culture is pending.       Assessment & Plan:  UTI  By history, she is developing a UTI. Urinalysis has no occult blood and she does not believe she has a kidney stone. Treat with Levaquin 500 milligrams daily for 7 days. Culture is pending.   Addendum: culture shows 75,000 col/ml multiple species. Continue treatment with Cipro since she was symptomatic.

## 2013-01-10 ENCOUNTER — Other Ambulatory Visit: Payer: Self-pay | Admitting: Internal Medicine

## 2013-01-10 LAB — URINE CULTURE

## 2013-02-17 ENCOUNTER — Other Ambulatory Visit: Payer: Self-pay

## 2013-02-17 MED ORDER — VALSARTAN 320 MG PO TABS: 320.0000 mg | ORAL_TABLET | Freq: Every day | ORAL | Status: DC

## 2013-02-17 MED ORDER — HYDROCHLOROTHIAZIDE 25 MG PO TABS: 25.0000 mg | ORAL_TABLET | Freq: Every day | ORAL | Status: DC

## 2013-03-08 ENCOUNTER — Ambulatory Visit: Payer: Self-pay | Admitting: Internal Medicine

## 2013-03-09 ENCOUNTER — Other Ambulatory Visit: Payer: Self-pay | Admitting: Internal Medicine

## 2013-04-09 ENCOUNTER — Ambulatory Visit: Payer: Self-pay | Admitting: Internal Medicine

## 2013-04-16 ENCOUNTER — Ambulatory Visit: Payer: Self-pay | Admitting: Internal Medicine

## 2013-09-04 ENCOUNTER — Other Ambulatory Visit: Payer: Self-pay | Admitting: Internal Medicine

## 2013-10-12 ENCOUNTER — Other Ambulatory Visit: Payer: Self-pay | Admitting: Internal Medicine

## 2013-11-11 ENCOUNTER — Other Ambulatory Visit: Payer: Self-pay | Admitting: Internal Medicine

## 2013-11-23 ENCOUNTER — Telehealth: Payer: Self-pay

## 2013-11-23 MED ORDER — SERTRALINE HCL 50 MG PO TABS: 50.0000 mg | ORAL_TABLET | Freq: Every day | ORAL | Status: DC

## 2013-11-23 NOTE — Telephone Encounter (Signed)
Patient requesting to resume her anti depressant meds. Was on Zoloft in the past. Has many stressors in her life currently, and feels she needs it. Has a CPE scheduled in June 2015. OK per Dr.  Renold Genta for Zoloft 50 mg one daily. Rx sent to [facility].

## 2013-12-10 ENCOUNTER — Other Ambulatory Visit: Payer: Self-pay | Admitting: Internal Medicine

## 2013-12-16 ENCOUNTER — Other Ambulatory Visit: Payer: Self-pay | Admitting: Internal Medicine

## 2013-12-27 ENCOUNTER — Other Ambulatory Visit: Payer: Self-pay

## 2013-12-27 MED ORDER — METOPROLOL SUCCINATE ER 100 MG PO TB24: 100.0000 mg | ORAL_TABLET | Freq: Every day | ORAL | Status: DC

## 2013-12-28 ENCOUNTER — Encounter: Payer: Self-pay | Admitting: Internal Medicine

## 2013-12-28 ENCOUNTER — Telehealth: Payer: Self-pay | Admitting: *Deleted

## 2013-12-28 ENCOUNTER — Other Ambulatory Visit: Payer: Self-pay | Admitting: Internal Medicine

## 2013-12-28 ENCOUNTER — Ambulatory Visit (INDEPENDENT_AMBULATORY_CARE_PROVIDER_SITE_OTHER): Payer: 59 | Admitting: Internal Medicine

## 2013-12-28 VITALS — BP 130/80 | HR 97 | Temp 98.6°F | Wt 217.0 lb

## 2013-12-28 DIAGNOSIS — F32A Depression, unspecified: Secondary | ICD-10-CM

## 2013-12-28 DIAGNOSIS — F341 Dysthymic disorder: Secondary | ICD-10-CM

## 2013-12-28 DIAGNOSIS — F3289 Other specified depressive episodes: Secondary | ICD-10-CM

## 2013-12-28 DIAGNOSIS — F329 Major depressive disorder, single episode, unspecified: Secondary | ICD-10-CM | POA: Insufficient documentation

## 2013-12-28 DIAGNOSIS — F419 Anxiety disorder, unspecified: Secondary | ICD-10-CM

## 2013-12-28 DIAGNOSIS — F411 Generalized anxiety disorder: Secondary | ICD-10-CM

## 2013-12-28 MED ORDER — ALPRAZOLAM 0.5 MG PO TBDP
0.5000 mg | ORAL_TABLET | Freq: Two times a day (BID) | ORAL | Status: DC | PRN
Start: 2013-12-28 — End: 2013-12-28

## 2013-12-28 MED ORDER — SERTRALINE HCL 100 MG PO TABS: 100.0000 mg | ORAL_TABLET | Freq: Every day | ORAL | Status: DC

## 2013-12-28 NOTE — Progress Notes (Signed)
   Subjective:    Patient ID: Jacqualine Code, female    DOB: [birth year], 53 y.o.   MRN: [id number]  HPI 53 year old White female who has upcoming appointment in June. She called today saying she's been crying uncontrollably. 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 Her mother has been sick and has been hospitalized and is to have a colectomy for colon polyps. These are not cancerous. Grandmother died of old age complications. She apparently was called of by a supervisor regarding her lapse in  nursing license. This apparently has been resolved however she got some sort of warning letter from the company. She works for [employer] as a Marine scientist. She is fearful she might lose her job. She is accompanied today by her son who resides with her. She is not suicidal. Says Zoloft has helped some but she thinks an increase in dose might help more. She was started recently on Zoloft 50 mg daily with the understanding she would follow up in June. She's not sleeping all that well her getting that much sleep. She dozes off and on and sometimes for 5 hours at a time.    Review of Systems     Objective:   Physical Exam  Not examined. Spent 20 minutes speaking with patient and her son about these issues. TSH was checked today because she complains that she's been forgetting things.       Assessment & Plan:  Anxiety  Depression  Plan: Increase Zoloft 100 mg daily. Prescribe Xanax 0.5 mg 1 by mouth twice a day when necessary anxiety. Followup. June. TSH is pending.

## 2013-12-28 NOTE — Telephone Encounter (Signed)
Patient calling having problems with anxiety and wanted to change medications, per Dr. Renold Genta pt is to schedule appt today at 4, which pt did.

## 2013-12-28 NOTE — Patient Instructions (Addendum)
Take Xanax as prescribed. Increase Zoloft to 100 mg daily. Return in June. TSH drawn

## 2013-12-29 LAB — TSH: TSH: 1.35 u[IU]/mL (ref 0.350–4.500)

## 2014-02-04 ENCOUNTER — Ambulatory Visit (INDEPENDENT_AMBULATORY_CARE_PROVIDER_SITE_OTHER): Payer: 59 | Admitting: Internal Medicine

## 2014-02-04 ENCOUNTER — Other Ambulatory Visit (HOSPITAL_COMMUNITY)
Admission: RE | Admit: 2014-02-04 | Discharge: 2014-02-04 | Disposition: A | Discharge status: Home / Self Care | Payer: 59 | Source: Ambulatory Visit | Attending: Internal Medicine | Admitting: Internal Medicine

## 2014-02-04 ENCOUNTER — Encounter: Payer: Self-pay | Admitting: Internal Medicine

## 2014-02-04 VITALS — BP 108/76 | HR 92 | Temp 98.9°F | Ht 66.0 in | Wt 214.0 lb

## 2014-02-04 DIAGNOSIS — I10 Essential (primary) hypertension: Secondary | ICD-10-CM

## 2014-02-04 DIAGNOSIS — Z79899 Other long term (current) drug therapy: Secondary | ICD-10-CM

## 2014-02-04 DIAGNOSIS — F32A Depression, unspecified: Secondary | ICD-10-CM

## 2014-02-04 DIAGNOSIS — Z0000 Encounter for general adult medical examination without abnormal findings: Secondary | ICD-10-CM

## 2014-02-04 DIAGNOSIS — F419 Anxiety disorder, unspecified: Secondary | ICD-10-CM

## 2014-02-04 DIAGNOSIS — Z130 Encounter for screening for diseases of the blood and blood-forming organs and certain disorders involving the immune mechanism: Secondary | ICD-10-CM

## 2014-02-04 DIAGNOSIS — Z01419 Encounter for gynecological examination (general) (routine) without abnormal findings: Secondary | ICD-10-CM | POA: Insufficient documentation

## 2014-02-04 DIAGNOSIS — E785 Hyperlipidemia, unspecified: Secondary | ICD-10-CM

## 2014-02-04 DIAGNOSIS — Z23 Encounter for immunization: Secondary | ICD-10-CM

## 2014-02-04 DIAGNOSIS — IMO0001 Reserved for inherently not codable concepts without codable children: Secondary | ICD-10-CM

## 2014-02-04 DIAGNOSIS — E119 Type 2 diabetes mellitus without complications: Secondary | ICD-10-CM

## 2014-02-04 DIAGNOSIS — Z1329 Encounter for screening for other suspected endocrine disorder: Secondary | ICD-10-CM

## 2014-02-04 DIAGNOSIS — F329 Major depressive disorder, single episode, unspecified: Secondary | ICD-10-CM

## 2014-02-04 DIAGNOSIS — E1165 Type 2 diabetes mellitus with hyperglycemia: Secondary | ICD-10-CM

## 2014-02-04 DIAGNOSIS — E8881 Metabolic syndrome: Secondary | ICD-10-CM

## 2014-02-04 DIAGNOSIS — E669 Obesity, unspecified: Secondary | ICD-10-CM

## 2014-02-04 DIAGNOSIS — F341 Dysthymic disorder: Secondary | ICD-10-CM

## 2014-02-04 DIAGNOSIS — Z13228 Encounter for screening for other metabolic disorders: Secondary | ICD-10-CM

## 2014-02-04 LAB — COMPREHENSIVE METABOLIC PANEL
ALBUMIN: 4.3 g/dL (ref 3.5–5.2)
ALT: 65 U/L — ABNORMAL HIGH (ref 0–35)
AST: 57 U/L — ABNORMAL HIGH (ref 0–37)
Alkaline Phosphatase: 69 U/L (ref 39–117)
BUN: 8 mg/dL (ref 6–23)
CALCIUM: 9.5 mg/dL (ref 8.4–10.5)
CHLORIDE: 97 meq/L (ref 96–112)
CO2: 29 mEq/L (ref 19–32)
CREATININE: 0.45 mg/dL — AB (ref 0.50–1.10)
GLUCOSE: 275 mg/dL — AB (ref 70–99)
POTASSIUM: 3.7 meq/L (ref 3.5–5.3)
Sodium: 136 mEq/L (ref 135–145)
Total Bilirubin: 0.6 mg/dL (ref 0.2–1.2)
Total Protein: 6.6 g/dL (ref 6.0–8.3)

## 2014-02-04 LAB — POCT URINALYSIS DIPSTICK
BILIRUBIN UA: NEGATIVE
Blood, UA: NEGATIVE
Glucose, UA: 250
KETONES UA: NEGATIVE
LEUKOCYTES UA: NEGATIVE
Nitrite, UA: NEGATIVE
PH UA: 6.5
PROTEIN UA: NEGATIVE
SPEC GRAV UA: 1.015
Urobilinogen, UA: NEGATIVE

## 2014-02-04 LAB — CBC WITH DIFFERENTIAL/PLATELET
Basophils Absolute: 0 10*3/uL (ref 0.0–0.1)
Basophils Relative: 0 % (ref 0–1)
EOS PCT: 2 % (ref 0–5)
Eosinophils Absolute: 0.2 10*3/uL (ref 0.0–0.7)
HEMATOCRIT: 43.9 % (ref 36.0–46.0)
HEMOGLOBIN: 14.9 g/dL (ref 12.0–15.0)
LYMPHS PCT: 35 % (ref 12–46)
Lymphs Abs: 2.8 10*3/uL (ref 0.7–4.0)
MCH: 29.5 pg (ref 26.0–34.0)
MCHC: 33.9 g/dL (ref 30.0–36.0)
MCV: 86.9 fL (ref 78.0–100.0)
MONO ABS: 0.5 10*3/uL (ref 0.1–1.0)
MONOS PCT: 6 % (ref 3–12)
NEUTROS ABS: 4.6 10*3/uL (ref 1.7–7.7)
Neutrophils Relative %: 57 % (ref 43–77)
Platelets: 217 10*3/uL (ref 150–400)
RBC: 5.05 MIL/uL (ref 3.87–5.11)
RDW: 13.8 % (ref 11.5–15.5)
WBC: 8 10*3/uL (ref 4.0–10.5)

## 2014-02-04 LAB — LIPID PANEL
CHOLESTEROL: 256 mg/dL — AB (ref 0–200)
HDL: 40 mg/dL (ref >39)
LDL Cholesterol: 167 mg/dL — ABNORMAL HIGH (ref 0–99)
TRIGLYCERIDES: 244 mg/dL — AB (ref <150)
Total CHOL/HDL Ratio: 6.4 Ratio
VLDL: 49 mg/dL — ABNORMAL HIGH (ref 0–40)

## 2014-02-04 LAB — HEMOGLOBIN A1C
Hgb A1c MFr Bld: 11.7 % — ABNORMAL HIGH (ref <5.7)
Mean Plasma Glucose: 289 mg/dL — ABNORMAL HIGH (ref <117)

## 2014-02-04 LAB — TSH: TSH: 1.275 u[IU]/mL (ref 0.350–4.500)

## 2014-02-05 LAB — VITAMIN D 25 HYDROXY (VIT D DEFICIENCY, FRACTURES): Vit D, 25-Hydroxy: 38 ng/mL (ref 30–89)

## 2014-02-05 LAB — MICROALBUMIN, URINE: MICROALB UR: 2.15 mg/dL — AB (ref 0.00–1.89)

## 2014-02-08 LAB — CYTOLOGY - PAP

## 2014-02-10 ENCOUNTER — Other Ambulatory Visit: Payer: Self-pay | Admitting: Internal Medicine

## 2014-02-27 ENCOUNTER — Encounter: Payer: Self-pay | Admitting: Internal Medicine

## 2014-02-27 NOTE — Progress Notes (Signed)
Subjective:    Patient ID: Courtney Parks, female    DOB: December 19, 1960, 53 y.o.   MRN: [id number]  HPI  53 year old White Female for health maintenance and evaluation of medical issues. She has a history of insulin-dependent diabetes mellitus. She takes metformin, Humalog, and is on Lantus insulin. She is followed by Dr. Buddy Duty, endocrinologist. History of hypertension. History of renal stent placement in 2012 for kidney stone. Urinary tract infection February 2011.  Apparently had normal cardiac catheterization in 2005.  However had laparoscopic cholecystectomy 2013  History of hyperlipidemia but has not wanted to take statin medication. History of depression. History of GE reflux. History of abnormal Pap smear a cyst with colposcopy in the past. History of migraine headaches.  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 She does not smoke or consume alcohol.  Family history: Father with history of hypertension. Mother with history of diabetes cancer and hypertension. One brother with hypertension and sleep apnea. One sister with history of peptic ulcer disease and obesity.  Patient seen here in late April with considerable anxiety. Was started on antianxiety medication and Zoloft was increased. She is doing better.       Review of Systems  Constitutional: Positive for fatigue.  HENT: Negative.   Eyes: Negative.   Respiratory: Negative.   Cardiovascular:       History of hypertension  Endocrine:       History of insulin-dependent diabetes  Neurological:       History of migraine headaches  Psychiatric/Behavioral:       Anxiety and depression improved       Objective:   Physical Exam  Vitals reviewed. Constitutional: She is oriented to person, place, and time. She appears well-developed and well-nourished. No distress.  HENT:  Head: Normocephalic and atraumatic.  Right Ear: External ear normal.  Left Ear: External ear normal.    Mouth/Throat: Oropharynx is clear and moist. No oropharyngeal exudate.  Eyes: Conjunctivae and EOM are normal. Pupils are equal, round, and reactive to light. Right eye exhibits no discharge. Left eye exhibits no discharge. No scleral icterus.  Neck: Neck supple. No JVD present. No thyromegaly present.  Cardiovascular: Normal rate, normal heart sounds and intact distal pulses.   No murmur heard. Pulmonary/Chest: Effort normal and breath sounds normal. No respiratory distress. She has no wheezes. She has no rales. She exhibits no tenderness.  Breasts normal female  Abdominal: Soft. Bowel sounds are normal. She exhibits no distension and no mass. There is no tenderness. There is no rebound and no guarding.  Genitourinary:  Pap taken. Bimanual normal  Musculoskeletal: Normal range of motion. She exhibits no edema.  Lymphadenopathy:    She has no cervical adenopathy.  Neurological: She is alert and oriented to person, place, and time. She has normal reflexes. She displays normal reflexes. No cranial nerve deficit. Coordination normal.  Skin: Skin is dry. No rash noted. She is not diaphoretic.  Psychiatric: She has a normal mood and affect. Her behavior is normal. Judgment and thought content normal.          Assessment & Plan:  Insulin-dependent diabetes mellitus-poorly controlled. Results sent to Dr. Buddy Duty  Hypertension-stable  Hyperlipidemia-needs to consider statin therapy  Obesity  Metabolic syndrome  Anxiety and depression-improved  Plan: Continue Xanax and Zoloft as previously prescribed in late April. These are working well for her. Continue diabetes treatment per endocrinologist.   Talk with patient about diet exercise and weight loss.Return in  6 months.

## 2014-02-27 NOTE — Patient Instructions (Addendum)
Please try to watch diet and exercise and lose weight. Diabetes needs to be under better control. Consider statin therapy for hyperlipidemia. Return in 6 months. Blood pressure is under good control but diabetes and lipids are not.

## 2014-03-04 ENCOUNTER — Other Ambulatory Visit: Payer: Self-pay | Admitting: Internal Medicine

## 2014-03-11 ENCOUNTER — Encounter: Payer: Self-pay | Admitting: Internal Medicine

## 2014-03-13 ENCOUNTER — Other Ambulatory Visit: Payer: Self-pay | Admitting: Internal Medicine

## 2014-04-22 ENCOUNTER — Ambulatory Visit (AMBULATORY_SURGERY_CENTER): Payer: Self-pay

## 2014-04-22 VITALS — Ht 67.0 in | Wt 217.0 lb

## 2014-04-22 DIAGNOSIS — Z860100 Personal history of colon polyps, unspecified: Secondary | ICD-10-CM

## 2014-04-22 MED ORDER — MOVIPREP 100 G PO SOLR: 1.0000 | Freq: Once | ORAL | Status: DC

## 2014-04-22 NOTE — Progress Notes (Signed)
No allergies to eggs or soy No past problems with anesthesia No home oxygen No diet/weight loss meds  Has email  Emmi instructions given for colonoscopy 

## 2014-05-06 ENCOUNTER — Encounter: Payer: Self-pay | Admitting: Internal Medicine

## 2014-05-06 ENCOUNTER — Ambulatory Visit (AMBULATORY_SURGERY_CENTER): Payer: 59 | Admitting: Internal Medicine

## 2014-05-06 VITALS — BP 112/68 | HR 76 | Temp 98.3°F | Resp 18 | Ht 67.0 in | Wt 217.0 lb

## 2014-05-06 DIAGNOSIS — D122 Benign neoplasm of ascending colon: Secondary | ICD-10-CM

## 2014-05-06 DIAGNOSIS — Z860100 Personal history of colon polyps, unspecified: Secondary | ICD-10-CM

## 2014-05-06 DIAGNOSIS — D126 Benign neoplasm of colon, unspecified: Secondary | ICD-10-CM

## 2014-05-06 DIAGNOSIS — Z800 Family history of malignant neoplasm of digestive organs: Secondary | ICD-10-CM

## 2014-05-06 DIAGNOSIS — D120 Benign neoplasm of cecum: Secondary | ICD-10-CM

## 2014-05-06 LAB — GLUCOSE, CAPILLARY
Glucose-Capillary: 348 mg/dL — ABNORMAL HIGH (ref 70–99)
Glucose-Capillary: 305 mg/dL — ABNORMAL HIGH (ref 70–99)
Glucose-Capillary: 269 mg/dL — ABNORMAL HIGH (ref 70–99)

## 2014-05-06 MED ORDER — SODIUM CHLORIDE 0.9 % IV SOLN: 500.0000 mL | INTRAVENOUS | Status: DC

## 2014-05-06 MED ORDER — INSULIN REGULAR HUMAN 100 UNIT/ML IJ SOLN
5.0000 [IU] | Freq: Once | INTRAMUSCULAR | Status: AC
  Administered 2014-05-06: 5 [IU] via SUBCUTANEOUS

## 2014-05-06 NOTE — Progress Notes (Signed)
1123 blood sugar 305.patient stating she has not had Metformin or insulin in two days. Patient stating her morning blood sugars are usually in the 200s.informed Dr Olevia Perches of  Above, no further orders received.

## 2014-05-06 NOTE — Patient Instructions (Signed)
YOU HAD AN ENDOSCOPIC PROCEDURE TODAY AT THE [facility]: Refer to the procedure report that was given to you for any specific questions about what was found during the examination.  If the procedure report does not answer your questions, please call your gastroenterologist to clarify.  If you requested that your care partner not be given the details of your procedure findings, then the procedure report has been included in a sealed envelope for you to review at your convenience later.  YOU SHOULD EXPECT: Some feelings of bloating in the abdomen. Passage of more gas than usual.  Walking can help get rid of the air that was put into your GI tract during the procedure and reduce the bloating. If you had a lower endoscopy (such as a colonoscopy or flexible sigmoidoscopy) you may notice spotting of blood in your stool or on the toilet paper. If you underwent a bowel prep for your procedure, then you may not have a normal bowel movement for a few days.  DIET: Your first meal following the procedure should be a light meal and then it is ok to progress to your normal diet.  A half-sandwich or bowl of soup is an example of a good first meal.  Heavy or fried foods are harder to digest and may make you feel nauseous or bloated.  Likewise meals heavy in dairy and vegetables can cause extra gas to form and this can also increase the bloating.  Drink plenty of fluids but you should avoid alcoholic beverages for 24 hours.  ACTIVITY: Your care partner should take you home directly after the procedure.  You should plan to take it easy, moving slowly for the rest of the day.  You can resume normal activity the day after the procedure however you should NOT DRIVE or use heavy machinery for 24 hours (because of the sedation medicines used during the test).    SYMPTOMS TO REPORT IMMEDIATELY: A gastroenterologist can be reached at any hour.  During normal business hours, 8:30 AM to 5:00 PM Monday through Friday,  call [phone].  After hours and on weekends, please call the GI answering service at [phone] who will take a message and have the physician on call contact you.   Following lower endoscopy (colonoscopy or flexible sigmoidoscopy):  Excessive amounts of blood in the stool  Significant tenderness or worsening of abdominal pains  Swelling of the abdomen that is new, acute  Fever of 100F or higher    FOLLOW UP: If any biopsies were taken you will be contacted by phone or by letter within the next 1-3 weeks.  Call your gastroenterologist if you have not heard about the biopsies in 3 weeks.  Our staff will call the home number listed on your records the next business day following your procedure to check on you and address any questions or concerns that you may have at that time regarding the information given to you following your procedure. This is a courtesy call and so if there is no answer at the home number and we have not heard from you through the emergency physician on call, we will assume that you have returned to your regular daily activities without incident.  SIGNATURES/CONFIDENTIALITY: You and/or your care partner have signed paperwork which will be entered into your electronic medical record.  These signatures attest to the fact that that the information above on your After Visit Summary has been reviewed and is understood.  Full responsibility of the confidentiality   of this discharge information lies with you and/or your care-partner.   Information on polyps and high fiber diet given to you today

## 2014-05-06 NOTE — Progress Notes (Signed)
Pt presets to admitting with blood sugar of 348.  Per Dr Nichola Sizer order, will give 5u regular insulin SQ.

## 2014-05-06 NOTE — Op Note (Signed)
[facility]  [address]. [place], [id number]   COLONOSCOPY PROCEDURE REPORT  PATIENT: Courtney Parks, Courtney Parks  MR#: [id number] BIRTHDATE: 08-May-1961 , 52  yrs. old GENDER: Female ENDOSCOPIST: Lafayette Dragon, MD REFERRED WS:FKCL Parke Simmers, M.D. PROCEDURE DATE:  05/06/2014 PROCEDURE:   Colonoscopy with snare polypectomy and Colonoscopy with cold biopsy polypectomy First Screening Colonoscopy - Avg.  risk and is 50 yrs.  old or older - No.  Prior Negative Screening - Now for repeat screening. 10 or more years since last screening  History of Adenoma - Now for follow-up colonoscopy & has been > or = to 3 yrs.  N/A  Polyps Removed Today? Yes. ASA CLASS:   Class II INDICATIONS:Patient's immediate family history of colon cancer and last colonoscopy in December 2004 was normal.  Mother had cancerous polyp and many precancerous polyps. MEDICATIONS: MAC sedation, administered by CRNA and propofol (Diprivan) 300mg  IV  DESCRIPTION OF PROCEDURE:   After the risks benefits and alternatives of the procedure were thoroughly explained, informed consent was obtained.  A digital rectal exam revealed no abnormalities of the rectum.   The LB [id number]  endoscope was introduced through the anus and advanced to the cecum, which was identified by both the appendix and ileocecal valve. No adverse events experienced.   The quality of the prep was good, using MoviPrep  The instrument was then slowly withdrawn as the colon was fully examined.      COLON FINDINGS: Three sessile and semipedunculated  polyps ranging between 3-44mm in ( cecum), 7 mm ileocecal valvr and 40mm ascending colon size were found at the cecum and in the ascending colon.  A polypectomy was performed with cold forceps, with a cold snare and using snare cautery.  The resection was complete and the polyp tissue was completely retrieved. in 2 separate containers Retroflexed views revealed no abnormalities. The  time to cecum=5 minutes 43 seconds.  Withdrawal time=12 minutes 08 seconds.  The scope was withdrawn and the procedure completed. COMPLICATIONS: There were no complications.  ENDOSCOPIC IMPRESSION: Three sessile polyps ranging between 3-55mm in size were found at the cecumx2  and in the ascending colonx1- 20 mm in diam , polypectomy was performed with cold forceps, with a cold snare and using snare cautery  RECOMMENDATIONS: 1.  Await pathology results 2.  high-fiber diet Recall colonoscopy pending path report   eSigned:  Lafayette Dragon, MD 05/06/2014 12:28 PM   cc:   PATIENT NAME:  Courtney Parks, Courtney Parks MR#: [id number]

## 2014-05-06 NOTE — Progress Notes (Signed)
Called to room to assist during endoscopic procedure.  Patient ID and intended procedure confirmed with present staff. Received instructions for my participation in the procedure from the performing physician.  

## 2014-05-06 NOTE — Progress Notes (Signed)
Report to PACU, RN, vss, BBS= Clear.  

## 2014-05-10 ENCOUNTER — Telehealth: Payer: Self-pay

## 2014-05-10 NOTE — Telephone Encounter (Signed)
Left message on answering machine. 

## 2014-05-18 ENCOUNTER — Encounter: Payer: Self-pay | Admitting: Internal Medicine

## 2014-06-09 ENCOUNTER — Other Ambulatory Visit: Payer: Self-pay

## 2014-06-09 MED ORDER — METOPROLOL SUCCINATE ER 100 MG PO TB24: 100.0000 mg | ORAL_TABLET | Freq: Every day | ORAL | Status: DC

## 2014-06-14 ENCOUNTER — Other Ambulatory Visit: Payer: Self-pay | Admitting: Internal Medicine

## 2014-07-14 ENCOUNTER — Other Ambulatory Visit: Payer: Self-pay | Admitting: Internal Medicine

## 2014-07-16 ENCOUNTER — Other Ambulatory Visit: Payer: Self-pay | Admitting: Internal Medicine

## 2014-08-08 ENCOUNTER — Telehealth: Payer: Self-pay

## 2014-08-08 NOTE — Telephone Encounter (Signed)
Left message for patient to call office to see if she has received her flu vaccine.

## 2015-01-06 ENCOUNTER — Encounter: Payer: Self-pay | Admitting: Internal Medicine

## 2015-01-06 ENCOUNTER — Ambulatory Visit (INDEPENDENT_AMBULATORY_CARE_PROVIDER_SITE_OTHER): Payer: 59 | Admitting: Internal Medicine

## 2015-01-06 VITALS — BP 112/70 | HR 110 | Temp 98.0°F | Wt 220.0 lb

## 2015-01-06 DIAGNOSIS — F339 Major depressive disorder, recurrent, unspecified: Secondary | ICD-10-CM

## 2015-01-06 DIAGNOSIS — E1165 Type 2 diabetes mellitus with hyperglycemia: Secondary | ICD-10-CM | POA: Diagnosis not present

## 2015-01-06 DIAGNOSIS — F439 Reaction to severe stress, unspecified: Secondary | ICD-10-CM

## 2015-01-06 DIAGNOSIS — E119 Type 2 diabetes mellitus without complications: Secondary | ICD-10-CM | POA: Diagnosis not present

## 2015-01-06 DIAGNOSIS — Z658 Other specified problems related to psychosocial circumstances: Secondary | ICD-10-CM | POA: Diagnosis not present

## 2015-01-06 DIAGNOSIS — I10 Essential (primary) hypertension: Secondary | ICD-10-CM | POA: Diagnosis not present

## 2015-01-06 DIAGNOSIS — Z794 Long term (current) use of insulin: Secondary | ICD-10-CM

## 2015-01-06 DIAGNOSIS — IMO0001 Reserved for inherently not codable concepts without codable children: Secondary | ICD-10-CM

## 2015-01-06 DIAGNOSIS — E785 Hyperlipidemia, unspecified: Secondary | ICD-10-CM

## 2015-01-06 DIAGNOSIS — R5383 Other fatigue: Secondary | ICD-10-CM | POA: Diagnosis not present

## 2015-01-06 LAB — HEMOGLOBIN A1C
Hgb A1c MFr Bld: 13 % — ABNORMAL HIGH (ref <5.7)
Mean Plasma Glucose: 326 mg/dL — ABNORMAL HIGH (ref <117)

## 2015-01-06 LAB — VITAMIN B12: Vitamin B-12: 536 pg/mL (ref 211–911)

## 2015-01-06 LAB — TSH: TSH: 1.408 u[IU]/mL (ref 0.350–4.500)

## 2015-01-06 LAB — T4, FREE: Free T4: 1.31 ng/dL (ref 0.80–1.80)

## 2015-01-06 MED ORDER — BUPROPION HCL ER (XL) 150 MG PO TB24: 150.0000 mg | ORAL_TABLET | Freq: Every day | ORAL | Status: DC

## 2015-01-06 NOTE — Patient Instructions (Addendum)
Continue Zoloft. Start Wellbutrin 150 mg daily. Return in 2 weeks. Call counselor. FMLA form to be completed.

## 2015-01-07 LAB — MICROALBUMIN / CREATININE URINE RATIO
CREATININE, URINE: 52.2 mg/dL
Microalb Creat Ratio: 11.5 mg/g (ref 0.0–30.0)
Microalb, Ur: 0.6 mg/dL (ref <2.0)

## 2015-01-10 DIAGNOSIS — F413 Other mixed anxiety disorders: Secondary | ICD-10-CM

## 2015-01-10 DIAGNOSIS — F339 Major depressive disorder, recurrent, unspecified: Secondary | ICD-10-CM

## 2015-01-11 NOTE — Progress Notes (Signed)
   Subjective:    Patient ID: Jacqualine Code, female    DOB: 1961/03/04, 54 y.o.   MRN: [id number]  HPI  54 year old Female in today with significant depression symptoms. She has a history of hypertension, hyperlipidemia, insulin-dependent diabetes. Has not been checking Accu-Cheks on a regular basis recently due to depression which goes back a number of weeks. Son finally convinced her to come seek some help. She's not been handling her job duties well. Unable to focus and concentrate. Mother has been ill. Patient has a lot of situational stress and tries to meet  needs of others and places others before her health.  She was treated for depression April 2015 and improved. She feel she needs some time off work at this point. Previously treated with Zoloft. However, in addition to anxiety there is a significant lack of energy and motivation.    Review of Systems     Objective:   Physical Exam  Spent 25 minutes speaking with patient about these issues. She has no intention to harm herself or others. She basically has situational stress and difficulty currently handling job duties due to the stress. Her diabetes is not well controlled. Her hemoglobin A1c is 13%. Thyroid functions are normal. B-12 level is normal. She is accompanied by her son. Spoke with them both at the same time. He agrees that she is significantly depressed.      Assessment & Plan:  Depression-recurrent. Continue Zoloft. Wellbutrin to be added  Situational stress  Insulin-dependent diabetes-currently poorly controlled  Hypertension  Hyperlipidemia  Plan: Patient will be started on Wellbutrin 150 mg XL daily and reevaluated on May 19. Counseling recommended. Patient requesting FMLA form completed for time off work. This will be completed.

## 2015-01-19 ENCOUNTER — Encounter: Payer: Self-pay | Admitting: Internal Medicine

## 2015-01-19 ENCOUNTER — Ambulatory Visit (INDEPENDENT_AMBULATORY_CARE_PROVIDER_SITE_OTHER): Payer: 59 | Admitting: Internal Medicine

## 2015-01-19 VITALS — BP 110/72 | HR 92 | Temp 97.4°F | Ht 67.0 in | Wt 220.0 lb

## 2015-01-19 DIAGNOSIS — F418 Other specified anxiety disorders: Secondary | ICD-10-CM | POA: Diagnosis not present

## 2015-01-19 DIAGNOSIS — F329 Major depressive disorder, single episode, unspecified: Secondary | ICD-10-CM

## 2015-01-19 DIAGNOSIS — F419 Anxiety disorder, unspecified: Principal | ICD-10-CM

## 2015-01-19 MED ORDER — BUPROPION HCL ER (XL) 300 MG PO TB24: 300.0000 mg | ORAL_TABLET | Freq: Every day | ORAL | Status: DC

## 2015-01-19 NOTE — Progress Notes (Signed)
   Subjective:    Patient ID: Courtney Parks, female    DOB: Dec 12, 1960, 54 y.o.   MRN: [id number]  HPI  She went to see Doroteo Glassman for counseling. She has been enrolled in a day program at [facility]. to help with stress management. She will be reevaluated by Ms. Wilson June 16. Patient feels she is not ready to go back to work yet. Still feeling overwhelmed. Some better but still has issues with anxiety and being overwhelmed. Needs to learn how to set limits with others. Still has lack of energy.    Review of Systems     Objective:   Physical Exam  Spent 15 minutes speaking with patient about anxiety depression and setting limits with others.       Assessment & Plan:  Anxiety depression  Plan: Continue FMLA through June 16. Enroll in daycare program at [facility]. for stress management. Increase Wellbutrin to 300 mg daily which may help  some with energy level. Return in 2 weeks.

## 2015-01-19 NOTE — Patient Instructions (Signed)
Note to be out of work through June 16. Continue counseling with Doroteo Glassman. Return in 2 weeks. Increase Wellbutrin to 300 mg daily.

## 2015-01-23 ENCOUNTER — Other Ambulatory Visit (HOSPITAL_COMMUNITY): Payer: 59 | Attending: Psychiatry | Admitting: Psychiatry

## 2015-01-23 ENCOUNTER — Telehealth: Payer: Self-pay | Admitting: Internal Medicine

## 2015-01-23 ENCOUNTER — Encounter (HOSPITAL_COMMUNITY): Payer: Self-pay

## 2015-01-23 DIAGNOSIS — I10 Essential (primary) hypertension: Secondary | ICD-10-CM | POA: Diagnosis not present

## 2015-01-23 DIAGNOSIS — F900 Attention-deficit hyperactivity disorder, predominantly inattentive type: Secondary | ICD-10-CM | POA: Insufficient documentation

## 2015-01-23 DIAGNOSIS — F331 Major depressive disorder, recurrent, moderate: Secondary | ICD-10-CM | POA: Insufficient documentation

## 2015-01-23 DIAGNOSIS — F419 Anxiety disorder, unspecified: Secondary | ICD-10-CM | POA: Diagnosis not present

## 2015-01-23 DIAGNOSIS — Z87891 Personal history of nicotine dependence: Secondary | ICD-10-CM | POA: Insufficient documentation
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
## 2015-01-23 NOTE — Telephone Encounter (Signed)
Pt told me she was being enrolled on advice of Glyn Ade, counselor.

## 2015-01-23 NOTE — Telephone Encounter (Signed)
Staff of [facility] called from [facility] to advise that patient has begun their IOP (intensive OP program) beginning today.  The normal LOS is typically 2 weeks.  They wanted you to be aware that she was enrolled in the program.

## 2015-01-24 ENCOUNTER — Other Ambulatory Visit (HOSPITAL_COMMUNITY): Payer: 59 | Admitting: Psychiatry

## 2015-01-24 DIAGNOSIS — F331 Major depressive disorder, recurrent, moderate: Secondary | ICD-10-CM | POA: Diagnosis not present

## 2015-01-24 NOTE — Psych (Signed)
    Daily Group Progress Note  Program: IOP  Group Time: 1100-1200  Participation Level: Active  Behavioral Response: Appropriate  Type of Therapy:  Psycho-education Group  Summary of Progress: Watched and processed a Ted Talk (Dr. Garnet Koyanagi' [place]):  Listening to Shame.  Garnet Koyanagi' discussed vulnerability vs shame.  Mentioned how vulnerability is not a weakness.  It's the birthplace of innovation, creativity and change.  Shame is a focus on self, guilt is a focus on behavior.  "Dare to walk in and find your way around in the [place]ampl[place] of the soul."

## 2015-01-25 ENCOUNTER — Other Ambulatory Visit: Payer: Self-pay | Admitting: Internal Medicine

## 2015-01-25 ENCOUNTER — Other Ambulatory Visit (HOSPITAL_COMMUNITY): Payer: 59 | Admitting: Psychiatry

## 2015-01-25 NOTE — Progress Notes (Signed)
    Daily Group Progress Note  Program: IOP  Group Time: 9:00-10:30  Participation Level: Active  Behavioral Response: Appropriate  Type of Therapy:  Psycho-education Group  Summary of Progress: Pt. Participated in medication education group facilitated by [facility].     Group Time: 10:30-12:00  Participation Level:  Active  Behavioral Response: Appropriate  Type of Therapy: Group Therapy  Summary of Progress: Pt. Presented as talkative, smiles and makes appropriate eye contact. Pt. Shared that work is her most significant stressor.   Nancie Neas, LPC

## 2015-01-26 ENCOUNTER — Other Ambulatory Visit (HOSPITAL_COMMUNITY): Payer: 59

## 2015-01-26 ENCOUNTER — Telehealth (HOSPITAL_COMMUNITY): Payer: Self-pay | Admitting: Psychiatry

## 2015-01-27 ENCOUNTER — Other Ambulatory Visit (HOSPITAL_COMMUNITY): Payer: 59

## 2015-01-31 ENCOUNTER — Telehealth: Payer: Self-pay | Admitting: Internal Medicine

## 2015-01-31 ENCOUNTER — Other Ambulatory Visit (HOSPITAL_COMMUNITY): Payer: 59 | Admitting: Psychiatry

## 2015-01-31 ENCOUNTER — Ambulatory Visit: Payer: 59 | Admitting: Internal Medicine

## 2015-01-31 DIAGNOSIS — F331 Major depressive disorder, recurrent, moderate: Secondary | ICD-10-CM

## 2015-01-31 NOTE — Telephone Encounter (Signed)
Patient returned call; states that she was confused.  Thought her follow up appointment was next week.  Re-scheduled appointment for patient for Thursday, 6/2 @ 2:45 (patient is still in IOP with Dr. Lovena Le, so she is busy until noon).    Advised Dr. Renold Genta that we were able to reach patient and R/S appointment.

## 2015-01-31 NOTE — Telephone Encounter (Signed)
Courtney Parks reached out to patient.  LMOM for patient at her cell #, home #.  LMOM for son @ home #.  LMOM for Mother #.   Patient had appointment today; 2 week f/u; depression.  She didn't show, so we were reaching out to patient out of concern.  Per appointment log from phone tree; reminder call was left on machine (cell #).

## 2015-01-31 NOTE — Progress Notes (Signed)
    Daily Group Progress Note  Program: IOP  Group Time: 1100-1200  Participation Level: Active  Behavioral Response: Appropriate and Sharing  Type of Therapy:  Psycho-education Group  Summary of Progress: Topic (Sleep Hygiene:  Practical Tips for Better Sleeping.)  Discussed the importance of sleep, stages of sleep, tips for a good night's sleep, and one tip the patient plans to implement.  Pt states she plans to exercise a little during the day.     Dellia Nims, M.Ed Case Manager

## 2015-02-01 ENCOUNTER — Other Ambulatory Visit (HOSPITAL_COMMUNITY): Payer: 59 | Attending: Psychiatry

## 2015-02-01 DIAGNOSIS — F331 Major depressive disorder, recurrent, moderate: Secondary | ICD-10-CM | POA: Insufficient documentation

## 2015-02-02 ENCOUNTER — Ambulatory Visit: Payer: 59 | Admitting: Internal Medicine

## 2015-02-02 ENCOUNTER — Telehealth: Payer: Self-pay | Admitting: Internal Medicine

## 2015-02-02 ENCOUNTER — Other Ambulatory Visit (HOSPITAL_COMMUNITY): Payer: 59 | Admitting: Psychiatry

## 2015-02-02 DIAGNOSIS — F331 Major depressive disorder, recurrent, moderate: Secondary | ICD-10-CM | POA: Diagnosis not present
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
## 2015-02-02 NOTE — Telephone Encounter (Signed)
Patient called at 1210; states that she did go to her counseling this a.m. From 9-12.  However, she did not sleep at all last night.  She is exhausted and wants to R/S her appointment for this afternoon.  She R/S for 6/9 @ 3pm.  She is too tired to keep her appointment for this afternoon.  She will keep it for next week for sure.

## 2015-02-03 ENCOUNTER — Other Ambulatory Visit (HOSPITAL_COMMUNITY): Payer: 59

## 2015-02-06 ENCOUNTER — Other Ambulatory Visit (HOSPITAL_COMMUNITY): Payer: 59 | Admitting: Psychiatry

## 2015-02-07 ENCOUNTER — Other Ambulatory Visit (HOSPITAL_COMMUNITY): Payer: 59 | Admitting: Psychiatry

## 2015-02-07 ENCOUNTER — Encounter (HOSPITAL_COMMUNITY): Payer: Self-pay | Admitting: Psychiatry

## 2015-02-07 DIAGNOSIS — F331 Major depressive disorder, recurrent, moderate: Secondary | ICD-10-CM | POA: Insufficient documentation

## 2015-02-07 NOTE — Progress Notes (Signed)
Patient ID: [name], female   DOB: [birth year], 54 y.o.   MRN: [id number] Discharge Note  Patient:  Courtney Parks is an 54 y.o., female DOB:  [birth year]  Date of Admission:  01/23/2015  Date of Discharge:  02/07/2015  Reason for Admission:depression  IOP Course:  Ms [name] attended a few days and decided group was not for her.  Coming she reported over the phone that groups were not helpful and seemed to make her feel worse.  Said she was not suicidal and was managing okay and would not be returning.  Mental Status at Discharge:not evaluated in person but over the phone admitted to depression and worry but no suicidal ideation  Lab Results: No results found for this or any previous visit (from the past 48 hour(s)).   Current outpatient prescriptions:  .  ALPRAZolam (XANAX) 0.5 MG tablet, Take 0.5 mg by mouth 2 (two) times daily as needed for anxiety., Disp: , Rfl:  .  buPROPion (WELLBUTRIN XL) 300 MG 24 hr tablet, Take 1 tablet (300 mg total) by mouth daily., Disp: 30 tablet, Rfl: 3 .  hydrochlorothiazide (HYDRODIURIL) 25 MG tablet, TAKE 1 TABLET BY MOUTH EVERY DAY, Disp: 30 tablet, Rfl: 11 .  insulin glargine (LANTUS) 100 UNIT/ML injection, Inject 60 Units into the skin at bedtime., Disp: , Rfl:  .  metFORMIN (GLUCOPHAGE) 1000 MG tablet, Take 1,000 mg by mouth 2 (two) times daily with a meal., Disp: , Rfl:  .  metoprolol succinate (TOPROL-XL) 100 MG 24 hr tablet, TAKE 1 TABLET BY MOUTH EVERY DAY WITH OR IMMEDIATELY FOLLOWING A MEAL, Disp: 30 tablet, Rfl: 11 .  Multiple Vitamin (MULTIVITAMIN) capsule, Take 1 capsule by mouth daily., Disp: , Rfl:  .  sertraline (ZOLOFT) 100 MG tablet, TAKE 1 TABLET BY MOUTH DAILY, Disp: 30 tablet, Rfl: 11 .  valsartan (DIOVAN) 320 MG tablet, TAKE 1 TABLET BY MOUTH EVERY DAY, Disp: 30 tablet, Rfl: 5  Axis Diagnosis:  Major depression, recurrent, moderate   Level of Care:  IOP  Discharge destination:  Other:  has appointments with outpatient  psychiatry and therapy  Is patient on multiple antipsychotic therapies at discharge:  No    Has Patient had three or more failed trials of antipsychotic monotherapy by history:  Negative  Patient phone:  [phone] (home)  Patient address:   [address] [place] [address],   Follow-up recommendations:  Activity:  continue current activity Diet:  continue current diet  Comments:  Decided the program was not for her  The patient received suicide prevention pamphlet:  Yes Belongings returned:  na  Clarene Reamer 02/07/2015, 1:14 PM

## 2015-02-07 NOTE — Progress Notes (Signed)
Courtney Parks is a 54 y.o., divorced, employed, Caucasian female, who was referred per psychologist (Dr. Doroteo Glassman); treatment for worsening depressive symptoms. Denied SI/HI or A/V hallucinations. Symptoms included: Poor sleep (2-4 hrs), fluctuating appetite, poor concentration, isolative, anhedonia, ruminating thoughts and anxiety. Pt stated the symptoms worsened last month. Multiple stressors: 1) Health Issues: Diabetes. Pt has been in the hospital twice since being diagnosed. 2) Son was hospitalized. 3) Two close friends (one need heart surgery and the other recently dx with cancer) 4) [name] declining health. She's had multiple surgeries. Currently residing with patient's brother and sister is assisting with the care taking in [facility]. 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 Conflicts with new supervisor of one year. Pt works from home; but stated the new supervisor has been making it very difficult for her to do her job. Pt hasn't worked for two weeks. 6) Unresolved grief/loss issues: In 12-29-92 patient's fiance' was found dead. Pt denied past psychiatric hospitalizations or past suicide attempts. Has seen Dr. Doroteo Glassman for one visit. Received grief counseling in 12/29/92 after death of her fiance'. Pt is requesting discharged today.  Pt only attended [facility] for four days.  Pt was very active in the groups whenever she attended.  Pt called this morning and left a vm stating that she would dread coming to group everyday.  "I really don't like putting all my information out there; although there was only a few people."  Pt denies SI/HI or A/V hallucinations whenever writer called pt.  Attempted to encourage pt to completed the program; but failed.  A:  D/C pt today.  F/U with Dr. Doroteo Glassman for therapy and Dr. Tommie Ard Baxley for medication management.  Encouraged support groups.  R:  Pt receptive.

## 2015-02-07 NOTE — Patient Instructions (Signed)
Patient is requesting discharged today.  Will follow up with Dr. Doroteo Glassman for therapy and Dr. Tommie Ard Baxley for medication management.  Encouraged support groups.

## 2015-02-08 ENCOUNTER — Other Ambulatory Visit (HOSPITAL_COMMUNITY): Payer: 59 | Admitting: Psychiatry

## 2015-02-08 NOTE — Progress Notes (Signed)
Patient ID: [name], female   DOB: 1961-02-26, 54 y.o.   MRN: [id number] The initial evaluation of Ms Courtney Parks is located under [employer] note of 01/23/2015

## 2015-02-09 ENCOUNTER — Ambulatory Visit (INDEPENDENT_AMBULATORY_CARE_PROVIDER_SITE_OTHER): Payer: 59 | Admitting: Internal Medicine

## 2015-02-09 ENCOUNTER — Encounter: Payer: Self-pay | Admitting: Internal Medicine

## 2015-02-09 ENCOUNTER — Other Ambulatory Visit (HOSPITAL_COMMUNITY): Payer: 59

## 2015-02-09 VITALS — BP 126/82 | HR 95 | Temp 97.4°F | Wt 219.0 lb

## 2015-02-09 DIAGNOSIS — Z794 Long term (current) use of insulin: Secondary | ICD-10-CM

## 2015-02-09 DIAGNOSIS — F32A Depression, unspecified: Secondary | ICD-10-CM

## 2015-02-09 DIAGNOSIS — G4700 Insomnia, unspecified: Secondary | ICD-10-CM | POA: Diagnosis not present

## 2015-02-09 DIAGNOSIS — F329 Major depressive disorder, single episode, unspecified: Secondary | ICD-10-CM

## 2015-02-09 DIAGNOSIS — E1165 Type 2 diabetes mellitus with hyperglycemia: Secondary | ICD-10-CM

## 2015-02-09 DIAGNOSIS — IMO0002 Reserved for concepts with insufficient information to code with codable children: Secondary | ICD-10-CM

## 2015-02-09 MED ORDER — ALPRAZOLAM 0.5 MG PO TABS: 0.5000 mg | ORAL_TABLET | Freq: Two times a day (BID) | ORAL | Status: DC | PRN

## 2015-02-09 NOTE — Progress Notes (Signed)
   Subjective:    Patient ID: Courtney Parks, female    DOB: 03-Feb-1961, 54 y.o.   MRN: [id number]  HPI  Here today to follow-up on anxiety and depression. Still having issues with depression. Does not feel able to return to work. She tried the IOP program at [facility] but did not like it very much. It was group therapy and that did not seem to help her, she says. Continuing with outpatient counseling. She is taking Wellbutrin and Zoloft. Having issues sleeping. She may take Xanax at night for sleep. Prescription provided today for Xanax.  With regard to diabetes mellitus, Accu-Cheks have not been good and I've suggested she increase Lantus to 30 units twice daily.    Review of Systems     Objective:   Physical Exam  Spent 20 minutes today speaking with patient about all of these issues. I do feel that she is not able to return to work at the present time. Needs to have more time off work.      Assessment & Plan:  Anxiety  Depression  Insomnia  Diabetes mellitus  Plan: Increase Lantus to 30 units twice daily. He takes Xanax at night for sleep. Continue Zoloft 100 mg daily and Wellbutrin as previously prescribed. Return July 8. Out of work until reassessed at that time

## 2015-02-09 NOTE — Patient Instructions (Addendum)
Take Xanax at night for sleep.  Continue Welbutrin. Take Lantus 30 units bid and continue metformin. Return in 4 weeks. Out of work until then.  Next appointment July 8

## 2015-02-10 ENCOUNTER — Other Ambulatory Visit (HOSPITAL_COMMUNITY): Payer: 59

## 2015-02-13 ENCOUNTER — Other Ambulatory Visit (HOSPITAL_COMMUNITY): Payer: 59

## 2015-02-14 ENCOUNTER — Other Ambulatory Visit (HOSPITAL_COMMUNITY): Payer: 59

## 2015-02-15 ENCOUNTER — Other Ambulatory Visit (HOSPITAL_COMMUNITY): Payer: 59

## 2015-02-16 ENCOUNTER — Other Ambulatory Visit (HOSPITAL_COMMUNITY): Payer: 59

## 2015-02-17 ENCOUNTER — Other Ambulatory Visit (HOSPITAL_COMMUNITY): Payer: 59

## 2015-02-20 ENCOUNTER — Other Ambulatory Visit (HOSPITAL_COMMUNITY): Payer: 59

## 2015-02-21 ENCOUNTER — Other Ambulatory Visit (HOSPITAL_COMMUNITY): Payer: 59

## 2015-02-22 ENCOUNTER — Other Ambulatory Visit (HOSPITAL_COMMUNITY): Payer: 59

## 2015-02-23 ENCOUNTER — Other Ambulatory Visit (HOSPITAL_COMMUNITY): Payer: 59

## 2015-02-24 ENCOUNTER — Other Ambulatory Visit (HOSPITAL_COMMUNITY): Payer: 59

## 2015-02-25 DIAGNOSIS — F419 Anxiety disorder, unspecified: Secondary | ICD-10-CM

## 2015-02-25 DIAGNOSIS — F339 Major depressive disorder, recurrent, unspecified: Secondary | ICD-10-CM

## 2015-02-25 DIAGNOSIS — G4700 Insomnia, unspecified: Secondary | ICD-10-CM

## 2015-02-27 ENCOUNTER — Other Ambulatory Visit (HOSPITAL_COMMUNITY): Payer: 59

## 2015-02-28 ENCOUNTER — Other Ambulatory Visit (HOSPITAL_COMMUNITY): Payer: 59

## 2015-03-01 ENCOUNTER — Other Ambulatory Visit (HOSPITAL_COMMUNITY): Payer: 59

## 2015-03-02 ENCOUNTER — Other Ambulatory Visit (HOSPITAL_COMMUNITY): Payer: 59

## 2015-03-03 ENCOUNTER — Telehealth: Payer: Self-pay | Admitting: Internal Medicine

## 2015-03-03 ENCOUNTER — Other Ambulatory Visit (HOSPITAL_COMMUNITY): Payer: 59

## 2015-03-03 NOTE — Telephone Encounter (Signed)
Call returned as requested. No answer. Left message. Pt has appt here July 8th

## 2015-03-03 NOTE — Telephone Encounter (Signed)
I called Dr. Glennon Mac back on behalf of Dr. Renold Genta.  LMOM for her that Dr. Renold Genta was available to speak with her until 5pm today.  Also advised that patient has a follow up appointment with [place] on 03/10/15.  Patient was also referred to Doroteo Glassman, PhD.  Provided her phone [phone] and fax [phone] so that she could fax auth release to Ellen's office in order to speak with her as well regarding patient and her consultations there in her office.    Will await further contact from Dr. Glennon Mac to Dr. Renold Genta for any further clinical information that may be needed by [employer].

## 2015-03-03 NOTE — Telephone Encounter (Signed)
She would like to speak with you regarding Vinaya's ongoing disability and current status and going forward.  She is a 3rd party that is needing to do a peer to peer review with you concerning your opinion of Chenika's current condition and her clinical status at the present time.    She states if you receive her voicemail to please leave some times that you feel you will be available for her to call you back when you can speak directly to you.  She would only speak with you.  Wouldn't speak with Vinnie Level.    Please call her directly.  Thank you.

## 2015-03-07 ENCOUNTER — Other Ambulatory Visit (HOSPITAL_COMMUNITY): Payer: 59

## 2015-03-08 ENCOUNTER — Other Ambulatory Visit (HOSPITAL_COMMUNITY): Payer: 59

## 2015-03-09 ENCOUNTER — Other Ambulatory Visit (HOSPITAL_COMMUNITY): Payer: 59

## 2015-03-10 ENCOUNTER — Encounter: Payer: Self-pay | Admitting: Internal Medicine

## 2015-03-10 ENCOUNTER — Ambulatory Visit (INDEPENDENT_AMBULATORY_CARE_PROVIDER_SITE_OTHER): Payer: 59 | Admitting: Internal Medicine

## 2015-03-10 ENCOUNTER — Other Ambulatory Visit (HOSPITAL_COMMUNITY): Payer: 59

## 2015-03-10 VITALS — BP 126/82 | HR 80 | Temp 97.3°F | Wt 213.0 lb

## 2015-03-10 DIAGNOSIS — F32A Depression, unspecified: Secondary | ICD-10-CM

## 2015-03-10 DIAGNOSIS — J069 Acute upper respiratory infection, unspecified: Secondary | ICD-10-CM

## 2015-03-10 DIAGNOSIS — F411 Generalized anxiety disorder: Secondary | ICD-10-CM | POA: Diagnosis not present

## 2015-03-10 DIAGNOSIS — F329 Major depressive disorder, single episode, unspecified: Secondary | ICD-10-CM | POA: Diagnosis not present

## 2015-03-13 ENCOUNTER — Other Ambulatory Visit (HOSPITAL_COMMUNITY): Payer: 59

## 2015-03-13 ENCOUNTER — Telehealth: Payer: Self-pay | Admitting: *Deleted
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
## 2015-03-13 NOTE — Telephone Encounter (Signed)
Pt called and has an appointment with Dr. Mancel Bale at [facility] 03/28/15, Tuesday 9:15.  [facility] not in network with her insurance / lt

## 2015-03-13 NOTE — Telephone Encounter (Signed)
Spoke with Patient gave her phone numbers for Dr Toy Cookey [phone], [facility] [phone], and [facility] [phone]. Patient instructed to call and schedule an appt. And call us back today with that information.

## 2015-03-13 NOTE — Patient Instructions (Signed)
Out of work an additional 2 weeks. Arrange for psychiatric medication consultation. Speak with Doroteo Glassman about progress with counseling. Return in 2 weeks

## 2015-03-21 ENCOUNTER — Telehealth: Payer: Self-pay

## 2015-03-21 LAB — HM MAMMOGRAPHY

## 2015-03-21 NOTE — Telephone Encounter (Signed)
Left message for patient to call back to discuss need for mammogram.

## 2015-03-27 ENCOUNTER — Ambulatory Visit: Payer: 59 | Admitting: Internal Medicine

## 2015-03-28 ENCOUNTER — Encounter: Payer: Self-pay | Admitting: *Deleted

## 2015-03-28 ENCOUNTER — Ambulatory Visit (INDEPENDENT_AMBULATORY_CARE_PROVIDER_SITE_OTHER): Payer: 59 | Admitting: Internal Medicine

## 2015-03-28 ENCOUNTER — Encounter: Payer: Self-pay | Admitting: Internal Medicine

## 2015-03-28 VITALS — BP 126/78 | HR 96 | Temp 98.1°F | Wt 210.5 lb

## 2015-03-28 DIAGNOSIS — G4700 Insomnia, unspecified: Secondary | ICD-10-CM | POA: Diagnosis not present

## 2015-03-28 DIAGNOSIS — F419 Anxiety disorder, unspecified: Principal | ICD-10-CM

## 2015-03-28 DIAGNOSIS — E1165 Type 2 diabetes mellitus with hyperglycemia: Secondary | ICD-10-CM

## 2015-03-28 DIAGNOSIS — F439 Reaction to severe stress, unspecified: Secondary | ICD-10-CM

## 2015-03-28 DIAGNOSIS — Z658 Other specified problems related to psychosocial circumstances: Secondary | ICD-10-CM

## 2015-03-28 DIAGNOSIS — F329 Major depressive disorder, single episode, unspecified: Secondary | ICD-10-CM

## 2015-03-28 DIAGNOSIS — F418 Other specified anxiety disorders: Secondary | ICD-10-CM

## 2015-03-28 DIAGNOSIS — R4581 Low self-esteem: Secondary | ICD-10-CM

## 2015-03-28 MED ORDER — INSULIN GLARGINE 100 UNIT/ML ~~LOC~~ SOLN: 30.0000 [IU] | Freq: Two times a day (BID) | SUBCUTANEOUS | Status: DC

## 2015-03-28 NOTE — Progress Notes (Signed)
   Subjective:    Patient ID: Courtney Parks, female    DOB: Aug 02, 1961, 54 y.o.   MRN: [id number]  HPI  Patient tells me her FMLA has been extended until August 14. She is relieved about that. She has been seeing Doroteo Glassman, psychologist, more regularly recently. Has appointment to see psychiatrist tomorrow for medication consultation. Unfortunately she says she feels a bit flat. Doesn't feel energetic and hasn't been taking good care of her diabetes. Has failed on a number of days to take her Lantus insulin which is disturbing. Her endocrinologist is Dr. Buddy Duty. She is running out of Lantus insulin and I have refilled that today.  We talked at length today about her poor self-esteem and why she doesn't feel invested in taking care of herself. She is invested in taking care of others. She talked about the possibility of traveling wants her son leaves the home. Daily have one car at the present time. She mentioned going camping with her dog to give herself time to think. It is difficult to get her to stay on one topic today and focus on taking care of herself.  She is reluctant have hemoglobin A1c drawn today saying it will not be good. She has aspirations of obtaining Trulicity to help control her diabetes. She tried it once for short time and says her diabetic control was much better. Has not seen her endocrinologist recently because she owes them money. Has not been paid for time out of work and that has caused some stress. Has not been to the gym too much in the past week. Says it's too hot to walk outside. Not keeping regular at hours. Sleeps for a number of hours and then may not sleep but for couple of hours. Would prefer that she have more consistent schedule. I think that would be better for her diabetes. She is attributing poor diabetic control to stress but also she's not been compliant with her medication.  She understands she will need to return to work and engage with her boss whom she finds  difficult.  Has considered a job change in the near future but hasn't taken any steps to look for one.  Says she is angry with herself or gaining weight and developing diabetes  Review of Systems     Objective:   Physical Exam  She was not examined today. I spent 35 minutes speaking with her and trying to coach her with regard to her health and diabetic control. She does feel she is sleeping better but not feeling energetic.      Assessment & Plan:  Anxiety  Depression  Insomnia  Insulin-dependent diabetes  Plan: I'm assuming she will return to work [employer]. She was encouraged to keep appointment with Dr. Redmond Pulling and psychiatrist. She will need to have hemoglobin A1c drawn in the near future once she gets back on Lantus insulin consistently in a proximally 6 weeks. Last A1c was May 6 and was 13%.

## 2015-03-29 NOTE — Patient Instructions (Signed)
Keep appointment with psychiatrist tomorrow. Continue to see Dr. Redmond Pulling. Will need hemoglobin A1c in 6 weeks after getting started back on Lantus insulin regularly.

## 2015-05-03 ENCOUNTER — Telehealth: Payer: Self-pay | Admitting: Internal Medicine

## 2015-05-03 NOTE — Telephone Encounter (Signed)
Patient states that she was contacted from her employer stating that they were going to need to place her in CAP (corrective action process).  She will need to meet with them tomorrow, 9/1 and begin this process.  Patient is calling because she feels Dr. Renold Genta could've/should've mentioned in her documentation some sort of restriction on her memory issues.    Reviewed DOS 03/28/15 office visit notes with Dr. Renold Genta; disposition states we assumed she would return to work on 8/14.  She was encouraged to keep appointment with Dr. Redmond Pulling and Psychiatrist, Dr. Mancel Bale @ [facility] on 03/28/15 @ 0915 [phone]).  She was to f/u with Dr. Renold Genta in the near future for A1C once back on Lantus insulin (last drawn 5/6).  Dr. Renold Genta advised that patient will be followed medically here for her diabetes, etc.  However, for her depression/stress/work issues, she will need to follow up with Dr. Redmond Pulling and Dr. Mancel Bale for their input related to this incident.  We were able to extend her FMLA until 8/13.  Again, we assumed patient was to return to work on 04/16/15.  Have not heard from patient until today, 05/03/15 @ 4:58 p.m.    LMOM for patient @ [phone] advising that she'll need to contact Dr. Mancel Bale' office and/or Dr. Redmond Pulling for additional documentation to provide regarding the above information for her employer.  Patient instructed to call the office if she doesn't clearly understand the message that was left.

## 2015-06-20 ENCOUNTER — Other Ambulatory Visit: Payer: Self-pay | Admitting: Internal Medicine

## 2015-06-20 NOTE — Telephone Encounter (Signed)
Needs appt soon. Refill once. How is diabetes doing?

## 2015-07-07 ENCOUNTER — Other Ambulatory Visit: Payer: Self-pay | Admitting: Internal Medicine

## 2015-07-07 NOTE — Telephone Encounter (Signed)
Left message for return call.

## 2015-07-07 NOTE — Telephone Encounter (Signed)
Not seen for diabetic follow up since May. Needs AIC and office visit next week. If books OV,  then OK refill this RX for 30 days only.

## 2015-07-13 NOTE — Telephone Encounter (Signed)
Left message for return call.

## 2015-07-17 ENCOUNTER — Encounter: Payer: Self-pay | Admitting: Internal Medicine

## 2015-07-17 ENCOUNTER — Ambulatory Visit (INDEPENDENT_AMBULATORY_CARE_PROVIDER_SITE_OTHER): Payer: 59 | Admitting: Internal Medicine

## 2015-07-17 ENCOUNTER — Telehealth: Payer: Self-pay | Admitting: Internal Medicine

## 2015-07-17 VITALS — BP 120/74 | HR 94 | Temp 99.1°F | Resp 20 | Ht 67.0 in | Wt 210.0 lb

## 2015-07-17 DIAGNOSIS — Z23 Encounter for immunization: Secondary | ICD-10-CM

## 2015-07-17 DIAGNOSIS — E1165 Type 2 diabetes mellitus with hyperglycemia: Secondary | ICD-10-CM

## 2015-07-17 LAB — HEMOGLOBIN A1C
Hgb A1c MFr Bld: 12.6 % — ABNORMAL HIGH (ref <5.7)
MEAN PLASMA GLUCOSE: 315 mg/dL — AB (ref <117)

## 2015-07-17 NOTE — Telephone Encounter (Signed)
Refill metformin and call her that you have done this

## 2015-07-17 NOTE — Patient Instructions (Signed)
Hemoglobin A1c to be reviewed with further recommendations to follow. Prevnar given today. Flu vaccine later this week.

## 2015-07-17 NOTE — Progress Notes (Signed)
   Subjective:    Patient ID: Courtney Parks, female    DOB: Dec 03, 1960, 54 y.o.   MRN: [id number]  HPI 54 year old White female not seen in some time. Last time I saw her she was on FMLA for anxiety and depression. She was seeing Doroteo Glassman for counseling. I ask her to return today to follow-up on diabetes. She says she lost her job with [employer] [employer]. Has not applied for unemployment. Says she may start looking for a new job after the first of the year. Says she's turned everything over to God and is not going to worry about it any longer. Her mother has developed another ulcer. Her dog has had orthopedic issues and had to have 2 surgeries.  She says she has not been able to afford Trulicity but when she tried it once before her diabetes control was excellent. I did give her a savings card today to investigate cost of Trulicity.  Says she is almost out of metformin. Says she didn't over-the-counter hemoglobin A1c recently and it was 11%. Doesn't seem motivated to take care of herself.    Review of Systems     Objective:   Physical Exam  Not examined. Spent 15 minutes speaking with her about these issues.      Assessment & Plan:  Type 2 diabetes mellitus-hemoglobin A1c pending  Depression  Plan: Review hemoglobin A1c and make further recommendations regarding metformin and Trulicity. Prevnar given today. Get flu vaccine in the near future.

## 2015-07-17 NOTE — Telephone Encounter (Signed)
States she checked on the Trulicity and she's not going to be able to afford it.  Just wanted you to know.

## 2015-07-18 ENCOUNTER — Other Ambulatory Visit: Payer: Self-pay

## 2015-07-18 MED ORDER — METFORMIN HCL 1000 MG PO TABS: 1000.0000 mg | ORAL_TABLET | Freq: Two times a day (BID) | ORAL | Status: DC

## 2015-07-18 NOTE — Telephone Encounter (Signed)
Left message advising patient of this.

## 2015-07-25 ENCOUNTER — Other Ambulatory Visit: Payer: Self-pay | Admitting: Internal Medicine

## 2015-07-29 ENCOUNTER — Other Ambulatory Visit: Payer: Self-pay | Admitting: Internal Medicine

## 2015-07-31 ENCOUNTER — Ambulatory Visit: Payer: Self-pay | Admitting: Internal Medicine

## 2015-08-03 ENCOUNTER — Telehealth: Payer: Self-pay | Admitting: Internal Medicine

## 2015-08-03 ENCOUNTER — Ambulatory Visit: Payer: Self-pay | Admitting: Internal Medicine

## 2015-08-03 NOTE — Telephone Encounter (Signed)
Patient was scheduled for a follow-up today at 12:00.    Original appointment was Monday, 11/28.  Patient called on 11/28 and stated she was at her brother's and requested to R/S appointment for Thursday, 12/1.  R/S for 12:00.  Today, patient no-show'd for appointment with no call.  Patient also would've received an appointment reminder/phone call on Tuesday, 12/6 from the phone tree.

## 2015-08-14 ENCOUNTER — Other Ambulatory Visit: Payer: Self-pay | Admitting: Internal Medicine

## 2015-09-05 ENCOUNTER — Other Ambulatory Visit: Payer: Self-pay | Admitting: Internal Medicine

## 2015-09-20 ENCOUNTER — Telehealth: Payer: Self-pay

## 2015-09-20 MED ORDER — INSULIN GLARGINE 100 UNIT/ML ~~LOC~~ SOLN: 30.0000 [IU] | Freq: Two times a day (BID) | SUBCUTANEOUS | Status: DC

## 2015-09-20 NOTE — Telephone Encounter (Signed)
Pharmacy contacted office for refill on lantus- one refill was given verbally by Dr. Renold Genta and pharmacist notified that no further refills will be given until patient is seen in office.

## 2015-10-07 ENCOUNTER — Other Ambulatory Visit: Payer: Self-pay | Admitting: Internal Medicine

## 2015-11-10 ENCOUNTER — Other Ambulatory Visit: Payer: Self-pay | Admitting: Internal Medicine

## 2015-12-07 ENCOUNTER — Other Ambulatory Visit: Payer: Self-pay | Admitting: Internal Medicine

## 2015-12-07 NOTE — Telephone Encounter (Signed)
Pt  at last visit was not working and had no health insurance. Last seen Nov 2016 for check on IDDM. Will need to see at least q 6 months. No recent CPE. Cannot continue to refill meds without regular office visits

## 2015-12-18 ENCOUNTER — Other Ambulatory Visit: Payer: Self-pay | Admitting: Internal Medicine

## 2016-01-27 ENCOUNTER — Other Ambulatory Visit: Payer: Self-pay | Admitting: Internal Medicine

## 2016-02-02 ENCOUNTER — Other Ambulatory Visit: Payer: Self-pay | Admitting: Internal Medicine

## 2016-02-05 ENCOUNTER — Encounter: Payer: Self-pay | Admitting: Internal Medicine

## 2016-02-20 ENCOUNTER — Other Ambulatory Visit: Payer: Self-pay | Admitting: Internal Medicine

## 2016-03-31 ENCOUNTER — Other Ambulatory Visit: Payer: Self-pay | Admitting: Internal Medicine

## 2016-04-01 ENCOUNTER — Other Ambulatory Visit (INDEPENDENT_AMBULATORY_CARE_PROVIDER_SITE_OTHER): Payer: BLUE CROSS/BLUE SHIELD | Admitting: Internal Medicine

## 2016-04-01 DIAGNOSIS — Z794 Long term (current) use of insulin: Secondary | ICD-10-CM | POA: Diagnosis not present

## 2016-04-01 DIAGNOSIS — E119 Type 2 diabetes mellitus without complications: Secondary | ICD-10-CM

## 2016-04-01 DIAGNOSIS — IMO0001 Reserved for inherently not codable concepts without codable children: Secondary | ICD-10-CM

## 2016-04-02 ENCOUNTER — Ambulatory Visit (INDEPENDENT_AMBULATORY_CARE_PROVIDER_SITE_OTHER): Payer: BLUE CROSS/BLUE SHIELD | Admitting: Internal Medicine

## 2016-04-02 ENCOUNTER — Encounter: Payer: Self-pay | Admitting: Internal Medicine

## 2016-04-02 VITALS — BP 128/78 | HR 133 | Temp 98.2°F | Ht 67.0 in | Wt 203.0 lb

## 2016-04-02 DIAGNOSIS — E1165 Type 2 diabetes mellitus with hyperglycemia: Secondary | ICD-10-CM | POA: Diagnosis not present

## 2016-04-02 DIAGNOSIS — F329 Major depressive disorder, single episode, unspecified: Secondary | ICD-10-CM

## 2016-04-02 DIAGNOSIS — F32A Depression, unspecified: Secondary | ICD-10-CM

## 2016-04-02 LAB — HEMOGLOBIN A1C
HEMOGLOBIN A1C: 11.5 % — AB (ref <5.7)
MEAN PLASMA GLUCOSE: 283 mg/dL

## 2016-04-02 MED ORDER — SERTRALINE HCL 100 MG PO TABS: 100.0000 mg | ORAL_TABLET | Freq: Every day | ORAL | 2 refills | Status: DC

## 2016-04-02 MED ORDER — BUPROPION HCL ER (XL) 300 MG PO TB24: 300.0000 mg | ORAL_TABLET | Freq: Every day | ORAL | 0 refills | Status: DC

## 2016-04-02 MED ORDER — METOPROLOL SUCCINATE ER 100 MG PO TB24: 100.0000 mg | ORAL_TABLET | Freq: Every day | ORAL | 0 refills | Status: DC

## 2016-04-02 MED ORDER — DULAGLUTIDE 0.75 MG/0.5ML ~~LOC~~ SOAJ: 0.7500 mg | SUBCUTANEOUS | 1 refills | Status: DC

## 2016-04-02 MED ORDER — VALSARTAN 320 MG PO TABS: 320.0000 mg | ORAL_TABLET | Freq: Every day | ORAL | 1 refills | Status: DC

## 2016-04-03 ENCOUNTER — Telehealth: Payer: Self-pay

## 2016-04-03 NOTE — Telephone Encounter (Signed)
Patient called in to report cbg reading this a.m of 329. Vomiting around 1pm today, left work. Check cbg recently, reading 321   Will go to urgent care. Trulicity was not available for pick up yesterday.  Will p/u today.

## 2016-04-21 NOTE — Patient Instructions (Addendum)
Trial of Trulicity. Return in 6 weeks. Reminded about annual diabetic eye exam.

## 2016-04-21 NOTE — Progress Notes (Signed)
   Subjective:    Patient ID: Courtney Parks, female    DOB: 06/28/1961, 55 y.o.   MRN: [id number]  HPI Patient is not been seen here in several months. 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 Now has health insurance. Seems less stressed. Likes her job.  Depression has improved.  Diabetes still not well-controlled 11.5%. Has not been checking Accu-Cheks on a regular basis. In November hemoglobin A1c was 12.6%. In May 2016 it was 13%. The best hemoglobin A1c she's had in this office was 10.5% July 2013.  Patient wants to try Trulicity for diabetic control. She'll follow-up. In 6 weeks. Doses 0.75 mg/o.5cc weekly.    Review of Systems as above     Objective:   Physical Exam Skin warm and dry. Nodes none. No thyromegaly. No JVD. Chest clear. Cardiac exam regular rate and rhythm. Extremities without edema.       Assessment & Plan:  Poorly controlled diabetes mellitus  Depression  Obesity  Plan: Return in 6 weeks for follow-up on new prescription for Trulicity. Continue same medications for depression.  25 minutes spent with patient  Reminded about annual diabetic eye exam. At some point needs physical examination.

## 2016-04-23 DIAGNOSIS — H25812 Combined forms of age-related cataract, left eye: Secondary | ICD-10-CM | POA: Diagnosis not present

## 2016-04-29 ENCOUNTER — Encounter: Payer: Self-pay | Admitting: Internal Medicine

## 2016-04-29 ENCOUNTER — Ambulatory Visit (INDEPENDENT_AMBULATORY_CARE_PROVIDER_SITE_OTHER): Payer: BLUE CROSS/BLUE SHIELD | Admitting: Internal Medicine

## 2016-04-29 VITALS — BP 118/74 | HR 111 | Temp 98.6°F | Ht 67.0 in | Wt 197.0 lb

## 2016-04-29 DIAGNOSIS — Z794 Long term (current) use of insulin: Secondary | ICD-10-CM

## 2016-04-29 DIAGNOSIS — J069 Acute upper respiratory infection, unspecified: Secondary | ICD-10-CM | POA: Diagnosis not present

## 2016-04-29 DIAGNOSIS — E119 Type 2 diabetes mellitus without complications: Secondary | ICD-10-CM

## 2016-04-29 DIAGNOSIS — IMO0001 Reserved for inherently not codable concepts without codable children: Secondary | ICD-10-CM

## 2016-04-29 MED ORDER — HYDROCODONE-HOMATROPINE 5-1.5 MG/5ML PO SYRP: 5.0000 mL | ORAL_SOLUTION | Freq: Three times a day (TID) | ORAL | 0 refills | Status: DC | PRN

## 2016-04-29 MED ORDER — LEVOFLOXACIN 500 MG PO TABS: 500.0000 mg | ORAL_TABLET | Freq: Every day | ORAL | 0 refills | Status: DC

## 2016-04-29 MED ORDER — CEFTRIAXONE SODIUM 1 G IJ SOLR
1.0000 g | Freq: Once | INTRAMUSCULAR | Status: AC
  Administered 2016-04-29: 1 g via INTRAMUSCULAR

## 2016-04-29 NOTE — Patient Instructions (Signed)
Levaquin 500 milligrams daily. Rocephin 1 g IM given in office. Hycodan 1 teaspoon by mouth every 8 hours when necessary cough. Note given to the excuse from work on August 25, 28, 29th

## 2016-04-29 NOTE — Progress Notes (Signed)
   Subjective:    Patient ID: Courtney Parks, female    DOB: 1961/07/11, 55 y.o.   MRN: [id number]  HPI Son came home midweek last week with URI. She had left cataract surgery on Thursday and is doing well from that. Now sees well. She developed URI symptoms Thursday and was out of work on Friday as well as today. Has malaise and fatigue. Is bringing up discolored sputum. She is an insulin-dependent diabetic. Recently started on Trulicity and says Accu-Cheks have been between 150 and 190. It's too early to check hemoglobin A1c at this point. Thinks she had a fever. No shaking chills.    Review of Systems as above     Objective:   Physical Exam Pharynx and TMs are clear. Neck is supple without significant adenopathy. Chest clear to auscultation.       Assessment & Plan:  Acute bronchitis  Plan: Rocephin 1 g IM. Levaquin 500 milligrams daily for 10 days. Hycodan 1 teaspoon by mouth every 8 hours when necessary cough. Out of work today and tomorrow as well as this past Friday. Note provided.

## 2016-05-02 ENCOUNTER — Other Ambulatory Visit: Payer: Self-pay

## 2016-05-02 MED ORDER — BUPROPION HCL ER (XL) 300 MG PO TB24: 300.0000 mg | ORAL_TABLET | Freq: Every day | ORAL | 0 refills | Status: DC

## 2016-05-02 MED ORDER — SERTRALINE HCL 100 MG PO TABS: 100.0000 mg | ORAL_TABLET | Freq: Every day | ORAL | 2 refills | Status: DC

## 2016-05-02 MED ORDER — METOPROLOL SUCCINATE ER 100 MG PO TB24: 100.0000 mg | ORAL_TABLET | Freq: Every day | ORAL | 0 refills | Status: DC

## 2016-05-02 MED ORDER — VALSARTAN 320 MG PO TABS: 320.0000 mg | ORAL_TABLET | Freq: Every day | ORAL | 2 refills | Status: DC

## 2016-05-02 MED ORDER — HYDROCHLOROTHIAZIDE 25 MG PO TABS: 25.0000 mg | ORAL_TABLET | Freq: Every day | ORAL | 11 refills | Status: DC

## 2016-05-03 ENCOUNTER — Other Ambulatory Visit: Payer: Self-pay

## 2016-05-03 MED ORDER — SERTRALINE HCL 100 MG PO TABS: 100.0000 mg | ORAL_TABLET | Freq: Every day | ORAL | 0 refills | Status: DC

## 2016-05-03 MED ORDER — BUPROPION HCL ER (XL) 300 MG PO TB24: 300.0000 mg | ORAL_TABLET | Freq: Every day | ORAL | 0 refills | Status: DC

## 2016-05-03 MED ORDER — VALSARTAN 320 MG PO TABS
320.0000 mg | ORAL_TABLET | Freq: Every day | ORAL | 0 refills | Status: DC
Start: 2016-05-03 — End: 2018-03-13

## 2016-05-03 MED ORDER — METOPROLOL SUCCINATE ER 100 MG PO TB24: 100.0000 mg | ORAL_TABLET | Freq: Every day | ORAL | 0 refills | Status: DC

## 2016-05-03 MED ORDER — HYDROCHLOROTHIAZIDE 25 MG PO TABS
25.0000 mg | ORAL_TABLET | Freq: Every day | ORAL | 0 refills | Status: DC
Start: 2016-05-03 — End: 2016-06-24

## 2016-05-03 NOTE — Telephone Encounter (Signed)
Called [facility] pharmacy yesterday and had Rx's sent in error removed.  Resiub[facility] to [facility].

## 2016-05-15 ENCOUNTER — Telehealth: Payer: Self-pay | Admitting: Internal Medicine

## 2016-05-15 NOTE — Telephone Encounter (Signed)
Patient called to say that she is unable to come for labs on 9/15 and visit on 9/18.  States her schedule has her working next week until 8pm all week. States she will look at her schedule for the following week when it comes out and call us back to R/S appointment.  Advised to please make sure that she calls back to R/S these appointments to be sure we can refill her medications.  This is a 6 week follow up on her medications for her diabetic medications.    Patient verbalized understanding of this message.

## 2016-05-17 ENCOUNTER — Other Ambulatory Visit: Payer: BLUE CROSS/BLUE SHIELD | Admitting: Internal Medicine

## 2016-05-20 ENCOUNTER — Ambulatory Visit: Payer: BLUE CROSS/BLUE SHIELD | Admitting: Internal Medicine

## 2016-06-07 ENCOUNTER — Encounter: Payer: Self-pay | Admitting: Internal Medicine

## 2016-06-07 ENCOUNTER — Ambulatory Visit: Payer: BLUE CROSS/BLUE SHIELD | Admitting: Internal Medicine

## 2016-06-07 ENCOUNTER — Other Ambulatory Visit: Payer: Self-pay | Admitting: Internal Medicine

## 2016-06-07 ENCOUNTER — Telehealth: Payer: Self-pay | Admitting: Internal Medicine

## 2016-06-07 MED ORDER — DULAGLUTIDE 0.75 MG/0.5ML ~~LOC~~ SOAJ: 0.7500 mg | SUBCUTANEOUS | 1 refills | Status: DC

## 2016-06-07 NOTE — Telephone Encounter (Signed)
Clarify local pharmacy to refill Trulicity. Patient wants [facility] at [address]. Prescription refilled.

## 2016-06-07 NOTE — Telephone Encounter (Signed)
Refill Trulicity to [facility]. Pt must be compliant with appts in the future.

## 2016-06-07 NOTE — Telephone Encounter (Signed)
I will refill trulicity.

## 2016-06-07 NOTE — Telephone Encounter (Signed)
Patient unable to keep appointment today due to work schedule.  She's almost out of Trulicity.  Wants to know we will provide enough refill to get her to her appointment (she'll reschedule for next week once she has her schedule).  She'll have to call back next week with that schedule and book her appointment at that time.    Otherwise, she has insulin and Metformin she can fall back on.    Please advise.

## 2016-06-24 ENCOUNTER — Encounter: Payer: Self-pay | Admitting: Internal Medicine

## 2016-06-24 ENCOUNTER — Ambulatory Visit (INDEPENDENT_AMBULATORY_CARE_PROVIDER_SITE_OTHER): Payer: BLUE CROSS/BLUE SHIELD | Admitting: Internal Medicine

## 2016-06-24 VITALS — BP 132/84 | HR 94 | Temp 97.9°F | Wt 202.0 lb

## 2016-06-24 DIAGNOSIS — F418 Other specified anxiety disorders: Secondary | ICD-10-CM | POA: Diagnosis not present

## 2016-06-24 DIAGNOSIS — E1165 Type 2 diabetes mellitus with hyperglycemia: Secondary | ICD-10-CM | POA: Diagnosis not present

## 2016-06-24 DIAGNOSIS — I10 Essential (primary) hypertension: Secondary | ICD-10-CM

## 2016-06-24 DIAGNOSIS — F329 Major depressive disorder, single episode, unspecified: Secondary | ICD-10-CM

## 2016-06-24 DIAGNOSIS — F419 Anxiety disorder, unspecified: Secondary | ICD-10-CM

## 2016-06-24 NOTE — Patient Instructions (Addendum)
Restart metformin. Continue Trulicity. Continue Diovan and metoprolol. Return in 4-6 weeks for office visit hemoglobin A1c.

## 2016-06-24 NOTE — Progress Notes (Signed)
   Subjective:    Patient ID: Courtney Parks, female    DOB: 05-Feb-1961, 55 y.o.   MRN: [id number]  HPI   55 year old Female for follow up of HTN and poorly controlled diabetes. Still at [facility] on [facility].  Is on Trulicity for diabetes mellitus. Currently not taking metformin. Thinks Accu-Cheks are high. Does not want to have hemoglobin A1c drawn today.    Review of Systems     Objective:   Physical Exam  Neck supple. Chest: clear to Auscultation. Cor: RRR, Ext: without edema. No foot ulcers . Callus left foot.      Assessment & Plan:  Poorly controlled diabetes-restart Metformin in addition to Trulicity  and  return in 4-6 weeks for office visit hemoglobin A1c  Has had recent eye exam. Has had flu vaccine through employment.  Essential hypertension-not taking HCTZ. Just taking metoprolol and Diovan.

## 2016-08-05 ENCOUNTER — Telehealth: Payer: Self-pay | Admitting: Internal Medicine

## 2016-08-05 ENCOUNTER — Ambulatory Visit: Payer: BLUE CROSS/BLUE SHIELD | Admitting: Internal Medicine

## 2016-08-05 NOTE — Telephone Encounter (Signed)
Patient was scheduled for a 6 week f/u today with labs to be drawn on her f/u of Trulicity.  4pm appointment.  Patient was a No Show.  Called patient to f/u; patient answered her cell phone @ 4:48 p.m. And advised that her Mom has had a heart attack and she has not had a chance to make contact with our office to cancel her appointment for today.  States that her Mom's Troponin came back at a level of 7 and she's very sorry that she failed to make contact with our office, but she will have to call us back and reschedule at a better time.    Advised patient that I am very sorry to hear about her Mom's condition and to please call us back at a better time.

## 2016-10-08 DIAGNOSIS — J101 Influenza due to other identified influenza virus with other respiratory manifestations: Secondary | ICD-10-CM | POA: Diagnosis not present

## 2016-10-27 DIAGNOSIS — L03115 Cellulitis of right lower limb: Secondary | ICD-10-CM | POA: Diagnosis not present

## 2016-10-28 ENCOUNTER — Emergency Department (HOSPITAL_COMMUNITY)
Admission: EM | Admit: 2016-10-28 | Discharge: 2016-10-28 | Disposition: A | Discharge status: Home / Self Care | Payer: BLUE CROSS/BLUE SHIELD | Attending: Emergency Medicine | Admitting: Emergency Medicine

## 2016-10-28 ENCOUNTER — Encounter (HOSPITAL_COMMUNITY): Payer: Self-pay | Admitting: Emergency Medicine

## 2016-10-28 DIAGNOSIS — I10 Essential (primary) hypertension: Secondary | ICD-10-CM | POA: Insufficient documentation

## 2016-10-28 DIAGNOSIS — L03115 Cellulitis of right lower limb: Secondary | ICD-10-CM

## 2016-10-28 DIAGNOSIS — L02611 Cutaneous abscess of right foot: Secondary | ICD-10-CM | POA: Diagnosis not present

## 2016-10-28 DIAGNOSIS — L03031 Cellulitis of right toe: Secondary | ICD-10-CM | POA: Diagnosis not present

## 2016-10-28 DIAGNOSIS — L0291 Cutaneous abscess, unspecified: Secondary | ICD-10-CM

## 2016-10-28 DIAGNOSIS — Z794 Long term (current) use of insulin: Secondary | ICD-10-CM | POA: Diagnosis not present

## 2016-10-28 DIAGNOSIS — E119 Type 2 diabetes mellitus without complications: Secondary | ICD-10-CM | POA: Insufficient documentation

## 2016-10-28 DIAGNOSIS — Z87891 Personal history of nicotine dependence: Secondary | ICD-10-CM | POA: Diagnosis not present

## 2016-10-28 DIAGNOSIS — L03116 Cellulitis of left lower limb: Secondary | ICD-10-CM | POA: Diagnosis present

## 2016-10-28 DIAGNOSIS — E1165 Type 2 diabetes mellitus with hyperglycemia: Secondary | ICD-10-CM

## 2016-10-28 DIAGNOSIS — Z7984 Long term (current) use of oral hypoglycemic drugs: Secondary | ICD-10-CM | POA: Insufficient documentation

## 2016-10-28 DIAGNOSIS — L02416 Cutaneous abscess of left lower limb: Secondary | ICD-10-CM | POA: Insufficient documentation

## 2016-10-28 LAB — CBC WITH DIFFERENTIAL/PLATELET
Basophils Absolute: 0 10*3/uL (ref 0.0–0.1)
Basophils Relative: 0 %
Eosinophils Absolute: 0.1 10*3/uL (ref 0.0–0.7)
Eosinophils Relative: 1 %
HCT: 42.7 % (ref 36.0–46.0)
HEMOGLOBIN: 14.2 g/dL (ref 12.0–15.0)
LYMPHS ABS: 3.2 10*3/uL (ref 0.7–4.0)
LYMPHS PCT: 23 %
MCH: 28.6 pg (ref 26.0–34.0)
MCHC: 33.3 g/dL (ref 30.0–36.0)
MCV: 86.1 fL (ref 78.0–100.0)
Monocytes Absolute: 0.7 10*3/uL (ref 0.1–1.0)
Monocytes Relative: 5 %
Neutro Abs: 9.8 10*3/uL — ABNORMAL HIGH (ref 1.7–7.7)
Neutrophils Relative %: 71 %
Platelets: 215 10*3/uL (ref 150–400)
RBC: 4.96 MIL/uL (ref 3.87–5.11)
RDW: 13 % (ref 11.5–15.5)
WBC: 13.7 10*3/uL — AB (ref 4.0–10.5)

## 2016-10-28 LAB — BASIC METABOLIC PANEL
ANION GAP: 13 (ref 5–15)
BUN: 13 mg/dL (ref 6–20)
CO2: 21 mmol/L — ABNORMAL LOW (ref 22–32)
Calcium: 9.5 mg/dL (ref 8.9–10.3)
Chloride: 101 mmol/L (ref 101–111)
Creatinine, Ser: 0.37 mg/dL — ABNORMAL LOW (ref 0.44–1.00)
Glucose, Bld: 309 mg/dL — ABNORMAL HIGH (ref 65–99)
Potassium: 3.8 mmol/L (ref 3.5–5.1)
Sodium: 135 mmol/L (ref 135–145)

## 2016-10-28 LAB — CBG MONITORING, ED: Glucose-Capillary: 274 mg/dL — ABNORMAL HIGH (ref 65–99)

## 2016-10-28 MED ORDER — SODIUM CHLORIDE 0.9 % IV BOLUS (SEPSIS)
1000.0000 mL | Freq: Once | INTRAVENOUS | Status: AC
  Administered 2016-10-28: 1000 mL via INTRAVENOUS

## 2016-10-28 MED ORDER — VANCOMYCIN HCL IN DEXTROSE 1-5 GM/200ML-% IV SOLN
1000.0000 mg | Freq: Once | INTRAVENOUS | Status: AC
  Administered 2016-10-28: 1000 mg via INTRAVENOUS
  Filled 2016-10-28: qty 200

## 2016-10-28 MED ORDER — ONDANSETRON HCL 4 MG/2ML IJ SOLN
4.0000 mg | Freq: Once | INTRAMUSCULAR | Status: AC
  Administered 2016-10-28: 4 mg via INTRAVENOUS
  Filled 2016-10-28: qty 2

## 2016-10-28 MED ORDER — BUPIVACAINE HCL (PF) 0.5 % IJ SOLN
20.0000 mL | Freq: Once | INTRAMUSCULAR | Status: AC
  Administered 2016-10-28: 20 mL
  Filled 2016-10-28: qty 30

## 2016-10-28 MED ORDER — MORPHINE SULFATE (PF) 4 MG/ML IV SOLN
4.0000 mg | Freq: Once | INTRAVENOUS | Status: AC
  Administered 2016-10-28: 4 mg via INTRAVENOUS
  Filled 2016-10-28: qty 1

## 2016-10-28 NOTE — ED Triage Notes (Signed)
Pt reports R great toe pain and swelling. Had purulent drainage yesterday. Went to UC for this yesterday, and was started on PO Abx. Now has red streaking on top of foot.

## 2016-10-28 NOTE — ED Provider Notes (Signed)
[facility] DEPT Provider Note   CSN: [id number] Arrival date & time: 10/28/16  1504     History   Chief Complaint Chief Complaint  Patient presents with  . Toe Pain  . Cellulitis    HPI Courtney Parks is a 56 y.o. female.  Pt presents to the ED today with right great toe pain and swelling.  The pt noticed it on 2/24.  She went to urgent care yesterday and was started on doxy and keflex.  She said there is more pain and swelling today.  The pt denies any fever.      Past Medical History:  Diagnosis Date  . Anxiety   . Depression   . Diabetes mellitus   . Diabetes mellitus, type II ([place])   . Hyperlipidemia   . Hypertension   . Kidney stones   . Kidney stones     Patient Active Problem List   Diagnosis Date Noted  . Depression, major, recurrent, moderate ([place]) 02/07/2015    Class: Chronic  . Anxiety and depression 12/28/2013  . Status post laparoscopic cholecystectomy 03/29/2012  . Insulin dependent diabetes mellitus ([place]) 01/01/2012  . Hyperlipidemia 01/01/2012  . Hypertension 01/01/2012  . History of kidney stones 01/01/2012  . COLONIC POLYPS, ADENOMATOUS, HX OF 05/27/2008    Past Surgical History:  Procedure Laterality Date  . CHOLECYSTECTOMY  02/28/2012   Procedure: LAPAROSCOPIC CHOLECYSTECTOMY WITH INTRAOPERATIVE CHOLANGIOGRAM;  Surgeon: Imogene Burn. Georgette Dover, MD;  Location: [facility];  Service: General;  Laterality: N/A;  . ULNAR NERVE REPAIR    . URETERAL STENT PLACEMENT      OB History    No data available       Home Medications    Prior to Admission medications   Medication Sig Start Date End Date Taking? Authorizing Provider  ALPRAZolam Duanne Moron) 0.5 MG tablet Take 1 tablet (0.5 mg total) by mouth 2 (two) times daily as needed for anxiety. 02/09/15   Elby Showers, MD  buPROPion (WELLBUTRIN XL) 300 MG 24 hr tablet Take 1 tablet (300 mg total) by mouth daily. 05/03/16   Elby Showers, MD  Dulaglutide (TRULICITY) A999333 0000000 SOPN Inject 0.75 mg into  the skin once a week. 06/07/16   Elby Showers, MD  metFORMIN (GLUCOPHAGE) 1000 MG tablet Take 1 tablet (1,000 mg total) by mouth 2 (two) times daily with a meal. 07/18/15   Elby Showers, MD  metoprolol succinate (TOPROL-XL) 100 MG 24 hr tablet Take 1 tablet (100 mg total) by mouth daily. Take with or immediately following a meal. 05/03/16   Elby Showers, MD  Misc Natural Products (LUTEIN 20) CAPS Take 1 capsule by mouth daily.    Historical Provider, MD  Multiple Vitamin (MULTIVITAMIN) capsule Take 1 capsule by mouth daily.    Historical Provider, MD  sertraline (ZOLOFT) 100 MG tablet Take 1 tablet (100 mg total) by mouth daily. 05/03/16   Elby Showers, MD  valsartan (DIOVAN) 320 MG tablet Take 1 tablet (320 mg total) by mouth daily. 05/03/16   Elby Showers, MD    Family History Family History  Problem Relation Age of Onset  . Diabetes Mother   . Hypertension Mother   . Colon cancer Mother   . Hypertension Father   . ADD / ADHD Maternal Grandmother     Social History Social History  Substance Use Topics  . Smoking status: Former Smoker    Years: 5.00    Types: Cigarettes    Quit date:  12/12/1983  . Smokeless tobacco: Never Used  . Alcohol use No     Allergies   Codeine; Demerol; and Shellfish allergy   Review of Systems Review of Systems  Skin: Positive for rash.  All other systems reviewed and are negative.    Physical Exam Updated Vital Signs BP 137/70 (BP Location: Left Arm)   Pulse 96   Temp 98.3 F (36.8 C) (Oral)   Resp 17   Ht 5\' 6"  (1.676 m)   Wt 195 lb (88.5 kg)   SpO2 98%   BMI 31.47 kg/m   Physical Exam  Constitutional: She is oriented to person, place, and time. She appears well-developed and well-nourished.  HENT:  Head: Normocephalic and atraumatic.  Right Ear: External ear normal.  Left Ear: External ear normal.  Nose: Nose normal.  Mouth/Throat: Oropharynx is clear and moist.  Eyes: Conjunctivae and EOM are normal. Pupils are equal, round,  and reactive to light.  Neck: Normal range of motion. Neck supple.  Cardiovascular: Regular rhythm, normal heart sounds and intact distal pulses.  Tachycardia present.   Pulmonary/Chest: Effort normal and breath sounds normal.  Abdominal: Soft. Bowel sounds are normal.  Musculoskeletal:  Right great toe cellulitis with abscess at base of toe.  Cellulitis spreading up foot.  Neurological: She is alert and oriented to person, place, and time.  Nursing note and vitals reviewed.    ED Treatments / Results  Labs (all labs ordered are listed, but only abnormal results are displayed) Labs Reviewed  BASIC METABOLIC PANEL - Abnormal; Notable for the following:       Result Value   CO2 21 (*)    Glucose, Bld 309 (*)    Creatinine, Ser 0.37 (*)    All other components within normal limits  CBC WITH DIFFERENTIAL/PLATELET - Abnormal; Notable for the following:    WBC 13.7 (*)    Neutro Abs 9.8 (*)    All other components within normal limits  CBG MONITORING, ED - Abnormal; Notable for the following:    Glucose-Capillary 274 (*)    All other components within normal limits    EKG  EKG Interpretation None       Radiology No results found.  Procedures .Marland KitchenIncision and Drainage Date/Time: 10/28/2016 8:27 PM Performed by: Isla Pence Authorized by: Isla Pence   Consent:    Consent obtained:  Verbal   Consent given by:  Patient   Risks discussed:  Bleeding, incomplete drainage, pain and infection   Alternatives discussed:  No treatment Location:    Type:  Abscess   Size:  1   Location:  Lower extremity   Lower extremity location:  Toe   Toe location:  R big toe Pre-procedure details:    Skin preparation:  Betadine Anesthesia (see MAR for exact dosages):    Anesthesia method:  Nerve block   Block location:  Right large toe   Block anesthetic:  Bupivacaine 0.5% w/o epi   Block technique:  Digital   Block injection procedure:  Anatomic landmarks identified, introduced  needle, incremental injection, anatomic landmarks palpated and negative aspiration for blood   Block outcome:  Anesthesia achieved Procedure type:    Complexity:  Simple Procedure details:    Needle aspiration: yes     Incision types:  Single straight   Scalpel blade:  11   Wound management:  Probed and deloculated   Drainage:  Purulent   Drainage amount:  Moderate   Wound treatment:  Wound left open  Packing materials:  None Post-procedure details:    Patient tolerance of procedure:  Tolerated well, no immediate complications   (including critical care time)  Medications Ordered in ED Medications  sodium chloride 0.9 % bolus 1,000 mL (0 mLs Intravenous Stopped 10/28/16 2028)  morphine 4 MG/ML injection 4 mg (4 mg Intravenous Given 10/28/16 1900)  ondansetron (ZOFRAN) injection 4 mg (4 mg Intravenous Given 10/28/16 1900)  vancomycin (VANCOCIN) IVPB 1000 mg/200 mL premix (0 mg Intravenous Stopped 10/28/16 2028)  bupivacaine (MARCAINE) 0.5 % injection 20 mL (20 mLs Infiltration Given 10/28/16 1901)     Initial Impression / Assessment and Plan / ED Course  I have reviewed the triage vital signs and the nursing notes.  Pertinent labs & imaging results that were available during my care of the patient were reviewed by me and considered in my medical decision making (see chart for details).    Pt is feeling better.  She knows to continue current antibiotics.  She knows to return if worse.  Final Clinical Impressions(s) / ED Diagnoses   Final diagnoses:  Cellulitis of right lower extremity  Abscess  Poorly controlled type 2 diabetes mellitus ([place])    New Prescriptions New Prescriptions   No medications on file     Isla Pence, MD 10/28/16 2030

## 2016-10-28 NOTE — Discharge Instructions (Signed)
Continue current antibiotics.

## 2016-11-01 ENCOUNTER — Ambulatory Visit (INDEPENDENT_AMBULATORY_CARE_PROVIDER_SITE_OTHER): Payer: BLUE CROSS/BLUE SHIELD | Admitting: Internal Medicine

## 2016-11-01 ENCOUNTER — Encounter: Payer: Self-pay | Admitting: Internal Medicine

## 2016-11-01 VITALS — BP 110/80 | HR 107 | Temp 98.6°F | Ht 66.0 in | Wt 196.2 lb

## 2016-11-01 DIAGNOSIS — L03031 Cellulitis of right toe: Secondary | ICD-10-CM | POA: Diagnosis not present

## 2016-11-01 DIAGNOSIS — Z794 Long term (current) use of insulin: Secondary | ICD-10-CM | POA: Diagnosis not present

## 2016-11-01 DIAGNOSIS — I10 Essential (primary) hypertension: Secondary | ICD-10-CM | POA: Diagnosis not present

## 2016-11-01 DIAGNOSIS — E119 Type 2 diabetes mellitus without complications: Secondary | ICD-10-CM | POA: Diagnosis not present

## 2016-11-01 DIAGNOSIS — IMO0001 Reserved for inherently not codable concepts without codable children: Secondary | ICD-10-CM

## 2016-11-01 DIAGNOSIS — D72829 Elevated white blood cell count, unspecified: Secondary | ICD-10-CM | POA: Diagnosis not present

## 2016-11-01 LAB — CBC WITH DIFFERENTIAL/PLATELET
BASOS PCT: 0 %
Basophils Absolute: 0 cells/uL (ref 0–200)
Eosinophils Absolute: 144 cells/uL (ref 15–500)
Eosinophils Relative: 2 %
HCT: 44.2 % (ref 35.0–45.0)
Hemoglobin: 14.2 g/dL (ref 11.7–15.5)
LYMPHS PCT: 34 %
Lymphs Abs: 2448 cells/uL (ref 850–3900)
MCH: 28.6 pg (ref 27.0–33.0)
MCHC: 32.1 g/dL (ref 32.0–36.0)
MCV: 89.1 fL (ref 80.0–100.0)
MONOS PCT: 7 %
MPV: 10.3 fL (ref 7.5–12.5)
Monocytes Absolute: 504 cells/uL (ref 200–950)
NEUTROS PCT: 57 %
Neutro Abs: 4104 cells/uL (ref 1500–7800)
PLATELETS: 250 10*3/uL (ref 140–400)
RBC: 4.96 MIL/uL (ref 3.80–5.10)
RDW: 13.3 % (ref 11.0–15.0)
WBC: 7.2 10*3/uL (ref 3.8–10.8)

## 2016-11-01 NOTE — Progress Notes (Signed)
   Subjective:    Patient ID: Courtney Parks, female    DOB: 31-Aug-1961, 56 y.o.   MRN: [id number]  HPI 56 year old White Female poorly controlled diabetic who developed swelling and redness of her right great toe  around February 22 or 23rd. She did notice a pinpoint opening/injury around the right plantar MTP joint. Doesn't recall a specific injury. This worsened into redness and swelling of the right great toe and MTP joint. She went to [facility] on February 25. An x-ray was taken. It was read as mild diffuse soft tissue swelling without fracture or acute osseous abnormality. Patient was prescribed Cipro 500 mg twice daily for 10 days and dicloxacillin 500 mg 4 times a day for 10 days.  She went to the emergency department on February 26. X-ray was not done but abscess right great toe base incised and drained. Was continued on same antibiotics. White blood cell count was 13,700. Patient was afebrile.  She is now here for follow-up. She has not been seen here since October 2017. At that time, she had not been taking metformin. She did not want hemoglobin A1c drawn. She was advised to return in 4-6 weeks for office visit and hemoglobin A1c but did not keep appointment.  She has a history of hypertension.  History of nondisplaced fracture distal phalanx second toe left foot.  She is to see Dr. Buddy Duty, endocrinologist. She is to be on insulin 62 units twice daily. She wanted to discontinue insulin and be on Trulicity.  Hypertension has been treated with Toprol HCTZ and Diovan.  She had tetanus immunization 2012.  She has a history of hyperlipidemia but has not wanted to take statin medication.  History of GE reflux.  History of migraine headaches.  Old records indicate she had a normal cardiac catheterization in 2005.  Does not smoke or consume alcohol.  She is allergic to shellfish causes swelling.  She has a history of depression and anxiety.  She had colonoscopy by Dr.  Olevia Perches in 2015.  She had laparoscopic cholecystectomy by Dr.Tsuei in 2013.  History of right ureteral stone with pyelonephritis 2012. Had cystoscopy and right double-J catheter placement.  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 She is divorced. Nonsmoker. No alcohol consumption.  Family history: Mother with history of diabetes and hypertension. One son healthy.         Review of Systems see above. Says she's been depressed lately due to death of her cat and mother's illness.     Objective:   Physical Exam She has erythematous right great toe with swelling extending to the MTP joint and slightly below. No drainage from where abscess was incised and drained.       Assessment & Plan:  Cellulitis right great toe and MTP joint-concern for osteomyelitis  Plan: Continue antibiotics as previously prescribed. Appointment with orthopedist for evaluation. Repeat CBC.  Follow-up on Monday. Out of work through Monday.

## 2016-11-02 LAB — HEMOGLOBIN A1C
Hgb A1c MFr Bld: 12.9 % — ABNORMAL HIGH (ref <5.7)
MEAN PLASMA GLUCOSE: 324 mg/dL

## 2016-11-02 LAB — MICROALBUMIN / CREATININE URINE RATIO
CREATININE, URINE: 40 mg/dL (ref 20–320)
Microalb Creat Ratio: 13 mcg/mg creat (ref <30)
Microalb, Ur: 0.5 mg/dL

## 2016-11-04 ENCOUNTER — Encounter: Payer: Self-pay | Admitting: Internal Medicine

## 2016-11-04 ENCOUNTER — Ambulatory Visit (INDEPENDENT_AMBULATORY_CARE_PROVIDER_SITE_OTHER): Payer: BLUE CROSS/BLUE SHIELD | Admitting: Internal Medicine

## 2016-11-04 VITALS — BP 142/80 | HR 98 | Temp 98.9°F | Ht 66.0 in | Wt 198.0 lb

## 2016-11-04 DIAGNOSIS — E1165 Type 2 diabetes mellitus with hyperglycemia: Secondary | ICD-10-CM | POA: Diagnosis not present

## 2016-11-04 DIAGNOSIS — L03031 Cellulitis of right toe: Secondary | ICD-10-CM

## 2016-11-04 NOTE — Patient Instructions (Signed)
Continue same antibiotics as previously prescribed and return Monday. Out of work through Monday. CBC drawn.

## 2016-11-04 NOTE — Progress Notes (Addendum)
   Subjective:    Patient ID: Courtney Parks, female    DOB: [birth year], 56 y.o.   MRN: [id number]  HPI 56 year old Female returns today for follow-up of infection right great toe. At last visitHere on March 2, white blood cell count had decreased to 7200 from emergency department visit  on February 26 when it was 13,700. Emergency department physician incised and drained abscess in that area on February 26. Patient had been placed on Dicloxacillin and Cipro at [facility] on February 25. An x-ray was taken at [facility] which was reported as soft tissue swelling without osseous abnormality  Emergency department physician did not change antibiotics that were previously prescribed at urgent care. She says she has 2 days of medication left. She has appointment tomorrow to see Dr. Doran Durand, orthopedist. Since she was here on March 2, there is much less redness and swelling. She has stayed off of it. She did not go to work today. I'm giving her an excuse to be out of work tomorrow as well.  Unfortunately, her diabetes is not well controlled at this time. She admits to not taking metformin on a regular basis because it causes diarrhea. [place] which has been prescribed once weekly has not been taken on a regular basis either and her hemoglobin A1c is 12.9%. 7 months ago we were in a similar circumstance and her A1c was 11.5%.  Patient says she's had lots of personal issues including illness with her mother and death of her cat.  She has a history of not really taking good care of her diabetes. In May 2016 hemoglobin A1c was 13%. In June 2015 hemoglobin A1c was 11.7%. In July 2013 hemoglobin A1c was 10.5%.  We had hoped if she could take Trulicity on a weekly basis that hemoglobin A1c would improve. Since she has insurance in a new job she is now able to obtain  Trulicity    Review of Systems     Objective:   Physical Exam Area of erythema right great toe extending back  to MTP joint has now changed to a more Baile shows color. There is less swelling and decreased warmth. Area where abscess was incised and drained shows no evidence of purulent material.       Assessment & Plan:  Improvement cellulitis right great toe and MTP joint. Continue Cipro and doxycycline and see Dr. Doran Durand tomorrow.  Poorly controlled diabetes mellitus-but with patient once again about the importance of taking [place] weekly. She is to return in 8 weeks for follow-up and hemoglobin A1c  Plan: Return in 8 weeks for follow-up hemoglobin A1c and office visit.

## 2016-11-04 NOTE — Patient Instructions (Addendum)
See  Dr. Doran Durand tomorrow for follow-up of cellulitis of toe. Please take [place] on a regular basis weekly in follow-up. 8 weeks.

## 2016-11-05 DIAGNOSIS — L03031 Cellulitis of right toe: Secondary | ICD-10-CM | POA: Diagnosis not present

## 2016-11-05 DIAGNOSIS — E11628 Type 2 diabetes mellitus with other skin complications: Secondary | ICD-10-CM | POA: Diagnosis not present

## 2016-11-05 DIAGNOSIS — Z794 Long term (current) use of insulin: Secondary | ICD-10-CM | POA: Diagnosis not present

## 2017-01-06 ENCOUNTER — Ambulatory Visit: Payer: BLUE CROSS/BLUE SHIELD | Admitting: Internal Medicine

## 2017-05-14 ENCOUNTER — Telehealth: Payer: Self-pay

## 2017-05-14 DIAGNOSIS — H00024 Hordeolum internum left upper eyelid: Secondary | ICD-10-CM | POA: Diagnosis not present

## 2017-05-14 DIAGNOSIS — S0501XA Injury of conjunctiva and corneal abrasion without foreign body, right eye, initial encounter: Secondary | ICD-10-CM | POA: Diagnosis not present

## 2017-05-14 NOTE — Telephone Encounter (Signed)
Sent pt letter about appointment being needed, pt has been very noncompliant with coming into the office and Dr. Renold Genta is aware

## 2017-05-15 DIAGNOSIS — H2000 Unspecified acute and subacute iridocyclitis: Secondary | ICD-10-CM | POA: Diagnosis not present

## 2017-05-21 DIAGNOSIS — H2000 Unspecified acute and subacute iridocyclitis: Secondary | ICD-10-CM | POA: Diagnosis not present

## 2017-07-07 DIAGNOSIS — H1789 Other corneal scars and opacities: Secondary | ICD-10-CM | POA: Diagnosis not present

## 2017-07-07 DIAGNOSIS — H4311 Vitreous hemorrhage, right eye: Secondary | ICD-10-CM | POA: Diagnosis not present

## 2017-07-14 DIAGNOSIS — K921 Melena: Secondary | ICD-10-CM | POA: Diagnosis not present

## 2017-07-29 DIAGNOSIS — D122 Benign neoplasm of ascending colon: Secondary | ICD-10-CM | POA: Diagnosis not present

## 2017-07-29 DIAGNOSIS — K5730 Diverticulosis of large intestine without perforation or abscess without bleeding: Secondary | ICD-10-CM | POA: Diagnosis not present

## 2017-07-29 DIAGNOSIS — D120 Benign neoplasm of cecum: Secondary | ICD-10-CM | POA: Diagnosis not present

## 2017-07-29 DIAGNOSIS — Z86010 Personal history of colonic polyps: Secondary | ICD-10-CM | POA: Diagnosis not present

## 2017-07-29 DIAGNOSIS — D125 Benign neoplasm of sigmoid colon: Secondary | ICD-10-CM | POA: Diagnosis not present

## 2017-07-29 DIAGNOSIS — K921 Melena: Secondary | ICD-10-CM | POA: Diagnosis not present

## 2017-07-29 DIAGNOSIS — K625 Hemorrhage of anus and rectum: Secondary | ICD-10-CM | POA: Diagnosis not present

## 2017-07-29 DIAGNOSIS — Z1211 Encounter for screening for malignant neoplasm of colon: Secondary | ICD-10-CM | POA: Diagnosis not present

## 2017-07-29 DIAGNOSIS — K635 Polyp of colon: Secondary | ICD-10-CM | POA: Diagnosis not present

## 2017-07-29 DIAGNOSIS — R1031 Right lower quadrant pain: Secondary | ICD-10-CM | POA: Diagnosis not present

## 2017-08-14 DIAGNOSIS — H43811 Vitreous degeneration, right eye: Secondary | ICD-10-CM | POA: Diagnosis not present

## 2017-11-17 ENCOUNTER — Other Ambulatory Visit: Payer: Self-pay

## 2017-11-17 ENCOUNTER — Emergency Department (HOSPITAL_COMMUNITY)
Admission: EM | Admit: 2017-11-17 | Discharge: 2017-11-17 | Disposition: A | Discharge status: Home / Self Care | Payer: BLUE CROSS/BLUE SHIELD | Attending: Emergency Medicine | Admitting: Emergency Medicine

## 2017-11-17 ENCOUNTER — Emergency Department (HOSPITAL_COMMUNITY): Payer: BLUE CROSS/BLUE SHIELD

## 2017-11-17 DIAGNOSIS — R109 Unspecified abdominal pain: Secondary | ICD-10-CM

## 2017-11-17 DIAGNOSIS — Z87891 Personal history of nicotine dependence: Secondary | ICD-10-CM | POA: Insufficient documentation

## 2017-11-17 DIAGNOSIS — I10 Essential (primary) hypertension: Secondary | ICD-10-CM | POA: Insufficient documentation

## 2017-11-17 DIAGNOSIS — Z794 Long term (current) use of insulin: Secondary | ICD-10-CM | POA: Insufficient documentation

## 2017-11-17 DIAGNOSIS — N390 Urinary tract infection, site not specified: Secondary | ICD-10-CM

## 2017-11-17 DIAGNOSIS — E119 Type 2 diabetes mellitus without complications: Secondary | ICD-10-CM | POA: Insufficient documentation

## 2017-11-17 DIAGNOSIS — R1111 Vomiting without nausea: Secondary | ICD-10-CM | POA: Diagnosis not present

## 2017-11-17 DIAGNOSIS — R1031 Right lower quadrant pain: Secondary | ICD-10-CM | POA: Diagnosis not present

## 2017-11-17 DIAGNOSIS — Z79899 Other long term (current) drug therapy: Secondary | ICD-10-CM | POA: Insufficient documentation

## 2017-11-17 LAB — URINALYSIS, ROUTINE W REFLEX MICROSCOPIC
Bilirubin Urine: NEGATIVE
Glucose, UA: >=500 mg/dL — AB
KETONES UR: 80 mg/dL — AB
Nitrite: NEGATIVE
Protein, ur: 30 mg/dL — AB
Specific Gravity, Urine: 1.026 (ref 1.005–1.030)
pH: 6 (ref 5.0–8.0)

## 2017-11-17 MED ORDER — SODIUM CHLORIDE 0.9 % IV BOLUS (SEPSIS)
1000.0000 mL | Freq: Once | INTRAVENOUS | Status: AC
  Administered 2017-11-17: 1000 mL via INTRAVENOUS

## 2017-11-17 MED ORDER — CEPHALEXIN 500 MG PO CAPS: 500.0000 mg | ORAL_CAPSULE | Freq: Four times a day (QID) | ORAL | 0 refills | Status: DC

## 2017-11-17 MED ORDER — HYDROMORPHONE HCL 1 MG/ML IJ SOLN: 0.5000 mg | Freq: Once | INTRAMUSCULAR | Status: DC

## 2017-11-17 MED ORDER — SODIUM CHLORIDE 0.9 % IV SOLN
1.0000 g | Freq: Once | INTRAVENOUS | Status: AC
  Administered 2017-11-17: 1 g via INTRAVENOUS
  Filled 2017-11-17: qty 10

## 2017-11-17 MED ORDER — MORPHINE SULFATE (PF) 2 MG/ML IV SOLN
2.0000 mg | Freq: Once | INTRAVENOUS | Status: AC
  Administered 2017-11-17: 2 mg via INTRAVENOUS
  Filled 2017-11-17: qty 1

## 2017-11-17 MED ORDER — SODIUM CHLORIDE 0.9 % IV SOLN
INTRAVENOUS | Status: DC
  Administered 2017-11-17: 11:00:00 via INTRAVENOUS

## 2017-11-17 MED ORDER — KETOROLAC TROMETHAMINE 30 MG/ML IJ SOLN
30.0000 mg | Freq: Once | INTRAMUSCULAR | Status: AC
  Administered 2017-11-17: 30 mg via INTRAVENOUS
  Filled 2017-11-17: qty 1

## 2017-11-17 NOTE — ED Triage Notes (Signed)
Per EMS, patient comes from home. C/o kidney stone. Pain in RLQ and R flank 7/10 since 1330 yesterday. Also c/o chills and temp 99 last night before bed. N/V yesterday and this AM. She has had kidney stones in the past-last 8 years ago. Hx of them passing and lithotripsy. Pt had 50 fent from EMS which pt reports took the edge off.

## 2017-11-17 NOTE — ED Notes (Signed)
Bed: WA21 Expected date:  Expected time:  Means of arrival:  Comments: EMS-kidney stone 

## 2017-11-17 NOTE — Discharge Instructions (Signed)
It was our pleasure to provide your ER care today - we hope that you feel better.  Take antibiotic as prescribed.  Take motrin or aleve as need for pain.   Follow up with primary care doctor in the next few days if symptoms fail to improve/resolve.  We did send a urine culture the results of which should be back in 2 days time - have your doctor follow up on that result then.  Return to ER if worse, new symptoms, worsening or intractable pain, persistent vomiting, other concern.   You were given pain medication in the ER - no driving for the next 4 hours.

## 2017-11-17 NOTE — ED Provider Notes (Signed)
[place] DEPT Provider Note   CSN: [id number] Arrival date & time: 11/17/17  1032     History   Chief Complaint Chief Complaint  Patient presents with  . Flank Pain    hx kidney stones  . Abdominal Pain    hx kidney stones    HPI Courtney Parks is a 57 y.o. female.  Patient c/o right flank pain in the past day. Pain constant, dull, moderate, non radiating, waxes and wanes in intensity. Hx kidney stones. Denies dysuria or hematuria. No fever or chills. Nausea, vomiting x 3-4 - not bloody or bilious. Having normal bms.    The history is provided by the patient.  Flank Pain  Associated symptoms include abdominal pain. Pertinent negatives include no chest pain, no headaches and no shortness of breath.  Abdominal Pain   Pertinent negatives include fever, dysuria and headaches.    Past Medical History:  Diagnosis Date  . Anxiety   . Depression   . Diabetes mellitus   . Diabetes mellitus, type II ([place])   . Hyperlipidemia   . Hypertension   . Kidney stones   . Kidney stones     Patient Active Problem List   Diagnosis Date Noted  . Depression, major, recurrent, moderate ([place]) 02/07/2015    Class: Chronic  . Anxiety and depression 12/28/2013  . Status post laparoscopic cholecystectomy 03/29/2012  . Insulin dependent diabetes mellitus ([place]) 01/01/2012  . Hyperlipidemia 01/01/2012  . Hypertension 01/01/2012  . History of kidney stones 01/01/2012  . COLONIC POLYPS, ADENOMATOUS, HX OF 05/27/2008    Past Surgical History:  Procedure Laterality Date  . CHOLECYSTECTOMY  02/28/2012   Procedure: LAPAROSCOPIC CHOLECYSTECTOMY WITH INTRAOPERATIVE CHOLANGIOGRAM;  Surgeon: Imogene Burn. Georgette Dover, MD;  Location: [facility];  Service: General;  Laterality: N/A;  . ULNAR NERVE REPAIR    . URETERAL STENT PLACEMENT      OB History    No data available       Home Medications    Prior to Admission medications   Medication Sig Start Date End Date  Taking? Authorizing Provider  ALPRAZolam Duanne Moron) 0.5 MG tablet Take 1 tablet (0.5 mg total) by mouth 2 (two) times daily as needed for anxiety. 02/09/15   Elby Showers, MD  buPROPion (WELLBUTRIN XL) 300 MG 24 hr tablet Take 1 tablet (300 mg total) by mouth daily. 05/03/16   Elby Showers, MD  ciprofloxacin (CIPRO) 500 MG tablet Take 500 mg by mouth 2 (two) times daily. 10/27/16   [provider]  Dulaglutide (TRULICITY) 4.16 SA/6.3KZ SOPN Inject 0.75 mg into the skin once a week. 06/07/16   Elby Showers, MD  metFORMIN (GLUCOPHAGE) 1000 MG tablet Take 1 tablet (1,000 mg total) by mouth 2 (two) times daily with a meal. 07/18/15   Baxley, Cresenciano Lick, MD  metoprolol succinate (TOPROL-XL) 100 MG 24 hr tablet Take 1 tablet (100 mg total) by mouth daily. Take with or immediately following a meal. 05/03/16   Baxley, Cresenciano Lick, MD  Misc Natural Products (LUTEIN 20) CAPS Take 1 capsule by mouth daily.    [provider]  Multiple Vitamin (MULTIVITAMIN) capsule Take 1 capsule by mouth daily.    [provider]  sertraline (ZOLOFT) 100 MG tablet Take 1 tablet (100 mg total) by mouth daily. 05/03/16   Elby Showers, MD  valsartan (DIOVAN) 320 MG tablet Take 1 tablet (320 mg total) by mouth daily. 05/03/16   Elby Showers, MD  Family History Family History  Problem Relation Age of Onset  . Diabetes Mother   . Hypertension Mother   . Colon cancer Mother   . Hypertension Father   . ADD / ADHD Maternal Grandmother     Social History Social History   Tobacco Use  . Smoking status: Former Smoker    Years: 5.00    Types: Cigarettes    Last attempt to quit: 12/12/1983    Years since quitting: 33.9  . Smokeless tobacco: Never Used  Substance Use Topics  . Alcohol use: No  . Drug use: No     Allergies   Codeine; Demerol; and Shellfish allergy   Review of Systems Review of Systems  Constitutional: Negative for fever.  HENT: Negative for sore throat.   Eyes: Negative for  redness.  Respiratory: Negative for shortness of breath.   Cardiovascular: Negative for chest pain.  Gastrointestinal: Positive for abdominal pain.  Genitourinary: Positive for flank pain. Negative for dysuria.  Musculoskeletal: Negative for back pain.  Skin: Negative for rash.  Neurological: Negative for headaches.  Hematological: Does not bruise/bleed easily.  Psychiatric/Behavioral: Negative for confusion.     Physical Exam Updated Vital Signs BP (!) 155/70   Pulse (!) 119   Temp 99 F (37.2 C) (Oral)   Resp 18   Ht 1.676 m (5\' 6" )   Wt 81.2 kg (179 lb)   SpO2 95%   BMI 28.89 kg/m   Physical Exam  Constitutional: She appears well-developed and well-nourished. No distress.  HENT:  Mouth/Throat: Oropharynx is clear and moist.  Eyes: Conjunctivae are normal. No scleral icterus.  Neck: Neck supple. No tracheal deviation present.  Cardiovascular: Normal rate, regular rhythm, normal heart sounds and intact distal pulses. Exam reveals no gallop and no friction rub.  No murmur heard. Pulmonary/Chest: Effort normal and breath sounds normal. No respiratory distress.  Abdominal: Soft. Normal appearance and bowel sounds are normal. She exhibits no distension. There is no tenderness.  Genitourinary:  Genitourinary Comments: No cva tenderness  Musculoskeletal: She exhibits no edema.  Neurological: She is alert.  Skin: Skin is warm and dry. No rash noted. She is not diaphoretic.  Psychiatric: She has a normal mood and affect.  Nursing note and vitals reviewed.    ED Treatments / Results  Labs (all labs ordered are listed, but only abnormal results are displayed) Results for orders placed or performed during the hospital encounter of 11/17/17  Urinalysis, Routine w reflex microscopic  Result Value Ref Range   Color, Urine YELLOW YELLOW   APPearance HAZY (A) CLEAR   Specific Gravity, Urine 1.026 1.005 - 1.030   pH 6.0 5.0 - 8.0   Glucose, UA >=500 (A) NEGATIVE mg/dL   Hgb  urine dipstick SMALL (A) NEGATIVE   Bilirubin Urine NEGATIVE NEGATIVE   Ketones, ur 80 (A) NEGATIVE mg/dL   Protein, ur 30 (A) NEGATIVE mg/dL   Nitrite NEGATIVE NEGATIVE   Leukocytes, UA MODERATE (A) NEGATIVE   RBC / HPF 0-5 0 - 5 RBC/hpf   WBC, UA TOO NUMEROUS TO COUNT 0 - 5 WBC/hpf   Bacteria, UA RARE (A) NONE SEEN   Squamous Epithelial / LPF 0-5 (A) NONE SEEN   Mucus PRESENT    Ct Renal Stone Study  Result Date: 11/17/2017 CLINICAL DATA:  Right flank and right lower quadrant pain. Chills. Nausea and vomiting. EXAM: CT ABDOMEN AND PELVIS WITHOUT CONTRAST TECHNIQUE: Multidetector CT imaging of the abdomen and pelvis was performed following the standard protocol without  IV contrast. COMPARISON:  CT scan dated 02/25/2012 FINDINGS: Lower chest: Normal. Hepatobiliary: No focal liver abnormality is seen. Status post cholecystectomy. No biliary dilatation. Pancreas: Unremarkable. No pancreatic ductal dilatation or surrounding inflammatory changes. Spleen: Normal in size without focal abnormality. Adrenals/Urinary Tract: There are 2 tiny stones in the right kidney and there are multiple small stones in the left kidney. There are small stable bilateral renal cysts. No hydronephrosis. The bladder appears normal. Adrenal glands are normal. Stomach/Bowel: There are a few scattered diverticula in the colon. Bowel is otherwise normal including the appendix. Vascular/Lymphatic: Aortic atherosclerosis. No enlarged abdominal or pelvic lymph nodes. Reproductive: Uterus and bilateral adnexa are unremarkable. Other: No abdominal wall hernia or abnormality. No abdominopelvic ascites. Musculoskeletal: No acute or significant osseous findings. IMPRESSION: 1. No acute abnormality of the abdomen or pelvis. 2. Tiny bilateral renal calculi without evidence of hydronephrosis or ureteral or bladder calculi. Electronically Signed   By: Lorriane Shire M.D.   On: 11/17/2017 11:43    EKG  EKG Interpretation None        Radiology No results found.  Procedures Procedures (including critical care time)  Medications Ordered in ED Medications  0.9 %  sodium chloride infusion (not administered)  ketorolac (TORADOL) 30 MG/ML injection 30 mg (not administered)     Initial Impression / Assessment and Plan / ED Course  I have reviewed the triage vital signs and the nursing notes.  Pertinent labs & imaging results that were available during my care of the patient were reviewed by me and considered in my medical decision making (see chart for details).  Iv ns. toradol iv. Labs. Imaging study ordered.   Reviewed nursing notes and prior charts for additional history.   Pain persists, but improved. Dilaudid .5 mg iv.   Ct reviewed - tiny renal stones but no ureteral stone.   Lab/ua reviewed - tntc wbc c/w uti.  u cx sent.   Rocephin iv.  Discussed results w pt.   pts pain is improved, afebrile, and currently appears stable for d/c.     Final Clinical Impressions(s) / ED Diagnoses   Final diagnoses:  None    ED Discharge Orders    None       Lajean Saver, MD 11/17/17 1330

## 2017-11-18 LAB — URINE CULTURE

## 2017-11-19 ENCOUNTER — Telehealth: Payer: Self-pay | Admitting: Internal Medicine

## 2017-11-19 NOTE — Telephone Encounter (Signed)
Called patient today, 3/20 to f/u hospital ED visit on 3/18.  Spoke with Son, Liz Malady.  He states that she is sleeping at this time.  States that he doesn't know if she will want to make a follow up appointment or not, but he knows that she is still not feeling 100%, but she is able to keep food down now, so she is feeling some better and at least she is able to sleep and rest at the present time.  I asked would he please ask her to call me and check in when she wakes up so that I can document that I did speak with her to follow up on her from her discharge from the ED.  He verbalized understanding of this request.

## 2017-11-19 NOTE — Telephone Encounter (Signed)
Patient returned the call; states that she is still a little nauseous, is taking Keflex qid x 7 days.  Says that she feels like she passed a stone and has 2 more.  Asked patient if she has a Dealer.  States she used to see Dr. Terance Hart.  Now wants to see Dr. Karsten Ro.  I offered to facilitate that referral for her; she declined at this time.  She states that she needs to get over this for now and get back to work before she can think about making an appointment.  Advised that it would take 2-4 weeks to get in with Dr. Karsten Ro.  She still states that she doesn't want to proceed with an appointment with Dr. Karsten Ro or Dr. Renold Genta at this time.  She will call when she is ready to make an appointment.  She has no idea when she can get off of work; sometimes it is 7:30 p.m. Before she can leave work in the evenings.  For this reason, she cannot make an appointment at this time.  Told patient to please call our office if she isn't feeling better when she finishes the antibiotic and we are more than happy to get her in to the office and follow up with her.  Patient verbalized understanding of our conversation.

## 2017-11-24 DIAGNOSIS — N23 Unspecified renal colic: Secondary | ICD-10-CM | POA: Diagnosis not present

## 2018-03-13 ENCOUNTER — Emergency Department (HOSPITAL_BASED_OUTPATIENT_CLINIC_OR_DEPARTMENT_OTHER): Payer: BLUE CROSS/BLUE SHIELD

## 2018-03-13 ENCOUNTER — Encounter (HOSPITAL_BASED_OUTPATIENT_CLINIC_OR_DEPARTMENT_OTHER): Payer: Self-pay | Admitting: *Deleted

## 2018-03-13 ENCOUNTER — Emergency Department (HOSPITAL_BASED_OUTPATIENT_CLINIC_OR_DEPARTMENT_OTHER)
Admission: EM | Admit: 2018-03-13 | Discharge: 2018-03-13 | Disposition: A | Discharge status: Home / Self Care | Payer: BLUE CROSS/BLUE SHIELD | Attending: Emergency Medicine | Admitting: Emergency Medicine

## 2018-03-13 ENCOUNTER — Other Ambulatory Visit: Payer: Self-pay

## 2018-03-13 DIAGNOSIS — Y9301 Activity, walking, marching and hiking: Secondary | ICD-10-CM | POA: Diagnosis not present

## 2018-03-13 DIAGNOSIS — Z79899 Other long term (current) drug therapy: Secondary | ICD-10-CM | POA: Insufficient documentation

## 2018-03-13 DIAGNOSIS — S299XXA Unspecified injury of thorax, initial encounter: Secondary | ICD-10-CM | POA: Diagnosis not present

## 2018-03-13 DIAGNOSIS — W01198A Fall on same level from slipping, tripping and stumbling with subsequent striking against other object, initial encounter: Secondary | ICD-10-CM | POA: Diagnosis not present

## 2018-03-13 DIAGNOSIS — S298XXA Other specified injuries of thorax, initial encounter: Secondary | ICD-10-CM | POA: Diagnosis not present

## 2018-03-13 DIAGNOSIS — Z7984 Long term (current) use of oral hypoglycemic drugs: Secondary | ICD-10-CM | POA: Insufficient documentation

## 2018-03-13 DIAGNOSIS — I10 Essential (primary) hypertension: Secondary | ICD-10-CM | POA: Diagnosis not present

## 2018-03-13 DIAGNOSIS — Z87891 Personal history of nicotine dependence: Secondary | ICD-10-CM | POA: Insufficient documentation

## 2018-03-13 DIAGNOSIS — E119 Type 2 diabetes mellitus without complications: Secondary | ICD-10-CM | POA: Diagnosis not present

## 2018-03-13 DIAGNOSIS — Y9289 Other specified places as the place of occurrence of the external cause: Secondary | ICD-10-CM | POA: Insufficient documentation

## 2018-03-13 DIAGNOSIS — R0781 Pleurodynia: Secondary | ICD-10-CM | POA: Diagnosis not present

## 2018-03-13 DIAGNOSIS — Y999 Unspecified external cause status: Secondary | ICD-10-CM | POA: Diagnosis not present

## 2018-03-13 MED ORDER — OXYCODONE-ACETAMINOPHEN 5-325 MG PO TABS: 1.0000 | ORAL_TABLET | Freq: Four times a day (QID) | ORAL | 0 refills | Status: DC | PRN

## 2018-03-13 MED ORDER — OXYCODONE-ACETAMINOPHEN 5-325 MG PO TABS
2.0000 | ORAL_TABLET | Freq: Once | ORAL | Status: AC
  Administered 2018-03-13: 2 via ORAL
  Filled 2018-03-13: qty 2

## 2018-03-13 NOTE — ED Provider Notes (Signed)
[facility] DEPT [facility] Provider Note: Georgena Spurling, MD, FACEP  CSN: [id number] MRN: [id number] ARRIVAL: 03/13/18 at 2143 ROOM: [facility]  Rib Injury   HISTORY OF PRESENT ILLNESS  03/13/18 11:00 PM Courtney Parks is a 57 y.o. female who fell while hiking several days ago and struck her left lower anterior chest against a rock.  She had a moderate pain after that which was controlled with ibuprofen.  Today she thinks she slept on it wrong and is now having severe pain in that area.  The pain is sharp and severe but not as severe as a kidney stone.  It is worse with movement or deep breathing.  She is taking shallow breaths because of the pain.    Past Medical History:  Diagnosis Date  . Anxiety   . Depression   . Diabetes mellitus, type II ([place])   . Hyperlipidemia   . Hypertension   . Kidney stones     Past Surgical History:  Procedure Laterality Date  . CHOLECYSTECTOMY  02/28/2012   Procedure: LAPAROSCOPIC CHOLECYSTECTOMY WITH INTRAOPERATIVE CHOLANGIOGRAM;  Surgeon: Imogene Burn. Georgette Dover, MD;  Location: [facility];  Service: General;  Laterality: N/A;  . ULNAR NERVE REPAIR    . URETERAL STENT PLACEMENT      Family History  Problem Relation Age of Onset  . Diabetes Mother   . Hypertension Mother   . Colon cancer Mother   . Hypertension Father   . ADD / ADHD Maternal Grandmother     Social History   Tobacco Use  . Smoking status: Former Smoker    Years: 5.00    Types: Cigarettes    Last attempt to quit: 12/12/1983    Years since quitting: 34.2  . Smokeless tobacco: Never Used  Substance Use Topics  . Alcohol use: No  . Drug use: No    Prior to Admission medications   Medication Sig Start Date End Date Taking? Authorizing Provider  ALPRAZolam Duanne Moron) 0.5 MG tablet Take 1 tablet (0.5 mg total) by mouth 2 (two) times daily as needed for anxiety. Patient not taking: Reported on 11/17/2017 02/09/15   Elby Showers, MD  buPROPion (WELLBUTRIN XL)  300 MG 24 hr tablet Take 1 tablet (300 mg total) by mouth daily. Patient not taking: Reported on 11/17/2017 05/03/16   Elby Showers, MD  cephALEXin (KEFLEX) 500 MG capsule Take 1 capsule (500 mg total) by mouth 4 (four) times daily. 11/17/17   Lajean Saver, MD  diphenhydramine-acetaminophen (TYLENOL PM) 25-500 MG TABS tablet Take 2 tablets by mouth at bedtime as needed (PAIN).    [provider]  Dulaglutide (TRULICITY) 5.05 LZ/7.6BH SOPN Inject 0.75 mg into the skin once a week. 06/07/16   Elby Showers, MD  metFORMIN (GLUCOPHAGE) 1000 MG tablet Take 1 tablet (1,000 mg total) by mouth 2 (two) times daily with a meal. Patient not taking: Reported on 11/17/2017 07/18/15   Elby Showers, MD  metoprolol succinate (TOPROL-XL) 100 MG 24 hr tablet Take 1 tablet (100 mg total) by mouth daily. Take with or immediately following a meal. 05/03/16   Baxley, Cresenciano Lick, MD  Multiple Vitamin (MULTIVITAMIN) capsule Take 1 capsule by mouth daily.    [provider]  OVER THE COUNTER MEDICATION Take 1 tablet by mouth daily. DR [place]    [provider]  sertraline (ZOLOFT) 100 MG tablet Take 1 tablet (100 mg total) by mouth daily. Patient not taking: Reported on 11/17/2017  05/03/16   Elby Showers, MD  valsartan (DIOVAN) 320 MG tablet Take 1 tablet (320 mg total) by mouth daily. Patient not taking: Reported on 11/17/2017 05/03/16   Elby Showers, MD    Allergies Codeine; Demerol; and Shellfish allergy   REVIEW OF SYSTEMS  Negative except as noted here or in the History of Present Illness.   PHYSICAL EXAMINATION  Initial Vital Signs Blood pressure (!) 171/83, pulse (!) 107, temperature 98.2 F (36.8 C), temperature source Oral, resp. rate 20, height 5\' 7"  (1.702 m), weight 86.2 kg (190 lb), SpO2 98 %.  Examination General: Well-developed, well-nourished female in no acute distress; appearance consistent with age of record HENT: normocephalic; atraumatic Eyes:  Normal appearance Neck: supple Heart: regular rate and rhythm Lungs: clear to auscultation bilaterally; shallow breaths Chest: Left lower anterior rib tenderness without deformity or crepitus Abdomen: soft; nondistended; nontender; bowel sounds present Extremities: No deformity; full range of motion Neurologic: Awake, alert and oriented; motor function intact in all extremities and symmetric; no facial droop Skin: Warm and dry Psychiatric: Appears uncomfortable   RESULTS  Summary of this visit's results, reviewed by myself:   EKG Interpretation  Date/Time:    Ventricular Rate:    PR Interval:    QRS Duration:   QT Interval:    QTC Calculation:   R Axis:     Text Interpretation:        Laboratory Studies: No results found for this or any previous visit (from the past 24 hour(s)). Imaging Studies: Dg Ribs Unilateral W/chest Left  Result Date: 03/13/2018 CLINICAL DATA:  57 y/o F; fall 1 week ago on a rock. Left-sided rib pain. EXAM: LEFT RIBS AND CHEST - 3+ VIEW COMPARISON:  02/25/2012 chest radiograph. FINDINGS: No fracture or other bone lesions are seen involving the ribs. There is no evidence of pneumothorax or pleural effusion. Both lungs are clear. Heart size and mediastinal contours are within normal limits. IMPRESSION: Negative. Electronically Signed   By: Kristine Garbe M.D.   On: 03/13/2018 22:24    ED COURSE and MDM  Nursing notes and initial vitals signs, including pulse oximetry, reviewed.  Vitals:   03/13/18 2150 03/13/18 2152 03/13/18 2225  BP:  (!) 196/87 (!) 171/83  Pulse:  (!) 110 (!) 107  Resp:  20 20  Temp:  98.2 F (36.8 C) 98.2 F (36.8 C)  TempSrc:  Oral Oral  SpO2:  97% 98%  Weight: 86.2 kg (190 lb)    Height: 5\' 7"  (1.702 m)     I suspect she has a fracture along the bone/cartilage junction of a rib or ribs on the left.  This would account for her pain without radiographic evidence of a fracture.  Consultation with the [facility] state controlled substances database reveals the patient has received no opioid prescriptions in the past 2 years.  PROCEDURES    ED DIAGNOSES     ICD-10-CM   1. Blunt chest trauma, initial encounter S29.8XXA        Chrisa Hassan, Jenny Reichmann, MD 03/13/18 2314

## 2018-03-13 NOTE — ED Triage Notes (Signed)
She was hiking a week ago and fell. Injury to her left ribs. Tonight the pain got worse.

## 2018-05-01 DIAGNOSIS — E113293 Type 2 diabetes mellitus with mild nonproliferative diabetic retinopathy without macular edema, bilateral: Secondary | ICD-10-CM | POA: Diagnosis not present

## 2018-05-01 DIAGNOSIS — Z961 Presence of intraocular lens: Secondary | ICD-10-CM | POA: Diagnosis not present

## 2018-05-01 DIAGNOSIS — H2511 Age-related nuclear cataract, right eye: Secondary | ICD-10-CM | POA: Diagnosis not present

## 2018-05-01 DIAGNOSIS — H25041 Posterior subcapsular polar age-related cataract, right eye: Secondary | ICD-10-CM | POA: Diagnosis not present

## 2018-06-09 DIAGNOSIS — H25041 Posterior subcapsular polar age-related cataract, right eye: Secondary | ICD-10-CM | POA: Diagnosis not present

## 2018-06-09 DIAGNOSIS — H52201 Unspecified astigmatism, right eye: Secondary | ICD-10-CM | POA: Diagnosis not present

## 2018-06-09 DIAGNOSIS — H25811 Combined forms of age-related cataract, right eye: Secondary | ICD-10-CM | POA: Diagnosis not present

## 2018-06-09 DIAGNOSIS — H2511 Age-related nuclear cataract, right eye: Secondary | ICD-10-CM | POA: Diagnosis not present

## 2018-11-01 ENCOUNTER — Emergency Department (HOSPITAL_COMMUNITY)
Admission: EM | Admit: 2018-11-01 | Discharge: 2018-11-01 | Disposition: A | Discharge status: Home / Self Care | Payer: BLUE CROSS/BLUE SHIELD | Attending: Emergency Medicine | Admitting: Emergency Medicine

## 2018-11-01 ENCOUNTER — Emergency Department (HOSPITAL_COMMUNITY): Payer: BLUE CROSS/BLUE SHIELD

## 2018-11-01 ENCOUNTER — Other Ambulatory Visit: Payer: Self-pay

## 2018-11-01 ENCOUNTER — Encounter (HOSPITAL_COMMUNITY): Payer: Self-pay | Admitting: Emergency Medicine

## 2018-11-01 DIAGNOSIS — S42201A Unspecified fracture of upper end of right humerus, initial encounter for closed fracture: Secondary | ICD-10-CM | POA: Diagnosis not present

## 2018-11-01 DIAGNOSIS — Z79899 Other long term (current) drug therapy: Secondary | ICD-10-CM | POA: Insufficient documentation

## 2018-11-01 DIAGNOSIS — Z87891 Personal history of nicotine dependence: Secondary | ICD-10-CM | POA: Insufficient documentation

## 2018-11-01 DIAGNOSIS — Y93K1 Activity, walking an animal: Secondary | ICD-10-CM | POA: Insufficient documentation

## 2018-11-01 DIAGNOSIS — S42292A Other displaced fracture of upper end of left humerus, initial encounter for closed fracture: Secondary | ICD-10-CM | POA: Diagnosis not present

## 2018-11-01 DIAGNOSIS — E119 Type 2 diabetes mellitus without complications: Secondary | ICD-10-CM | POA: Diagnosis not present

## 2018-11-01 DIAGNOSIS — S4992XA Unspecified injury of left shoulder and upper arm, initial encounter: Secondary | ICD-10-CM | POA: Diagnosis not present

## 2018-11-01 DIAGNOSIS — Z7984 Long term (current) use of oral hypoglycemic drugs: Secondary | ICD-10-CM | POA: Diagnosis not present

## 2018-11-01 DIAGNOSIS — Y999 Unspecified external cause status: Secondary | ICD-10-CM | POA: Diagnosis not present

## 2018-11-01 DIAGNOSIS — I10 Essential (primary) hypertension: Secondary | ICD-10-CM | POA: Diagnosis not present

## 2018-11-01 DIAGNOSIS — Y929 Unspecified place or not applicable: Secondary | ICD-10-CM | POA: Diagnosis not present

## 2018-11-01 DIAGNOSIS — W010XXA Fall on same level from slipping, tripping and stumbling without subsequent striking against object, initial encounter: Secondary | ICD-10-CM | POA: Insufficient documentation

## 2018-11-01 MED ORDER — OXYCODONE-ACETAMINOPHEN 5-325 MG PO TABS: 1.0000 | ORAL_TABLET | Freq: Four times a day (QID) | ORAL | 0 refills | Status: DC | PRN

## 2018-11-01 MED ORDER — OXYCODONE-ACETAMINOPHEN 5-325 MG PO TABS
1.0000 | ORAL_TABLET | Freq: Once | ORAL | Status: AC
  Administered 2018-11-01: 1 via ORAL
  Filled 2018-11-01: qty 1

## 2018-11-01 NOTE — ED Notes (Signed)
Patient given discharge teaching and verbalized understanding. Patient ambulated out of ED with a steady gait. 

## 2018-11-01 NOTE — ED Notes (Signed)
Bed: [facility] Expected date:  Expected time:  Means of arrival:  Comments: 

## 2018-11-01 NOTE — ED Triage Notes (Signed)
Patient reports tripping over dogs and falling approximately 30 minutes ago. C/o left shoulder pain.

## 2018-11-01 NOTE — Discharge Instructions (Signed)
You have been diagnosed with a broken left shoulder (left humeral head fracture).  Please wear sling for support.  Call and follow up with orthopedist next week for further care.  Take pain medication as needed.

## 2018-11-01 NOTE — ED Notes (Signed)
Bed: [facility] Expected date:  Expected time:  Means of arrival:  Comments: Hold per charge

## 2018-11-01 NOTE — ED Provider Notes (Signed)
[facility] DEPT Provider Note   CSN: [id number] Arrival date & time: 11/01/18  2220    History   Chief Complaint Chief Complaint  Patient presents with  . Arm Injury    HPI Courtney Parks is a 58 y.o. female.     The history is provided by the patient. No language interpreter was used.  Arm Injury  Associated symptoms: no fever     58 year old female presenting for evaluation of a fall.  Patient report earlier tonight, she was walking 1 of her dog when she actually tripped on another 1, fell to the left side of body with an outstretched arm and struck her left shoulder onto the ground.  She denies any loss of consciousness but reported immediate pain primarily to her left shoulder radiates to her left elbow.  Pain is sharp, shooting, 5 out of 10, worsening with movement.  No associated numbness.  No headache, neck pain or pain anywhere else.  She denies any specific treatment tried.  Past Medical History:  Diagnosis Date  . Anxiety   . Depression   . Diabetes mellitus, type II ([place])   . Hyperlipidemia   . Hypertension   . Kidney stones     Patient Active Problem List   Diagnosis Date Noted  . Depression, major, recurrent, moderate ([place]) 02/07/2015    Class: Chronic  . Anxiety and depression 12/28/2013  . Status post laparoscopic cholecystectomy 03/29/2012  . Insulin dependent diabetes mellitus ([employer]) 01/01/2012  . Hyperlipidemia 01/01/2012  . Hypertension 01/01/2012  . History of kidney stones 01/01/2012  . COLONIC POLYPS, ADENOMATOUS, HX OF 05/27/2008    Past Surgical History:  Procedure Laterality Date  . CHOLECYSTECTOMY  02/28/2012   Procedure: LAPAROSCOPIC CHOLECYSTECTOMY WITH INTRAOPERATIVE CHOLANGIOGRAM;  Surgeon: Imogene Burn. Georgette Dover, MD;  Location: [facility];  Service: General;  Laterality: N/A;  . ULNAR NERVE REPAIR    . URETERAL STENT PLACEMENT       OB History   No obstetric history on file.      Home  Medications    Prior to Admission medications   Medication Sig Start Date End Date Taking? Authorizing Provider  diphenhydramine-acetaminophen (TYLENOL PM) 25-500 MG TABS tablet Take 2 tablets by mouth at bedtime as needed (PAIN).    [provider]  Dulaglutide (TRULICITY) 1.32 GM/0.1UU SOPN Inject 0.75 mg into the skin once a week. 06/07/16   Elby Showers, MD  metoprolol succinate (TOPROL-XL) 100 MG 24 hr tablet Take 1 tablet (100 mg total) by mouth daily. Take with or immediately following a meal. 05/03/16   Baxley, Cresenciano Lick, MD  Multiple Vitamin (MULTIVITAMIN) capsule Take 1 capsule by mouth daily.    [provider]  OVER THE COUNTER MEDICATION Take 1 tablet by mouth daily. DR [place]    [provider]  oxyCODONE-acetaminophen (PERCOCET) 5-325 MG tablet Take 1 tablet by mouth every 6 (six) hours as needed for severe pain. 03/13/18   Molpus, John, MD    Family History Family History  Problem Relation Age of Onset  . Diabetes Mother   . Hypertension Mother   . Colon cancer Mother   . Hypertension Father   . ADD / ADHD Maternal Grandmother     Social History Social History   Tobacco Use  . Smoking status: Former Smoker    Years: 5.00    Types: Cigarettes    Last attempt to quit: 12/12/1983    Years since quitting: 34.9  .  Smokeless tobacco: Never Used  Substance Use Topics  . Alcohol use: No  . Drug use: No     Allergies   Codeine; Demerol; and Shellfish allergy   Review of Systems Review of Systems  Constitutional: Negative for fever.  Musculoskeletal: Positive for arthralgias.  Skin: Negative for rash and wound.  Neurological: Negative for numbness.     Physical Exam Updated Vital Signs BP (!) 188/78 (BP Location: Right Arm) Comment: Pt took BP meds today.  Pulse (!) 108   Temp 97.7 F (36.5 C) (Oral)   Resp 18   Ht 5\' 7"  (1.702 m)   Wt 99.8 kg   SpO2 97%   BMI 34.46 kg/m   Physical Exam Vitals signs and  nursing note reviewed.  Constitutional:      General: She is not in acute distress.    Appearance: She is well-developed.  HENT:     Head: Atraumatic.  Eyes:     Conjunctiva/sclera: Conjunctivae normal.  Neck:     Musculoskeletal: Normal range of motion and neck supple.     Comments: No cervical midline spine tenderness Musculoskeletal:        General: Tenderness (L shoulder: tenderness to lateral deltoid with decreased ROM 2/2 pain.  tenderness to L elbow with decreased ROM 2/2 pain. radial pulse 2+) present.  Skin:    Findings: No rash.  Neurological:     Mental Status: She is alert.      ED Treatments / Results  Labs (all labs ordered are listed, but only abnormal results are displayed) Labs Reviewed - No data to display  EKG None  Radiology Dg Shoulder Left  Result Date: 11/01/2018 CLINICAL DATA:  Pain after trauma.  Fall. EXAM: LEFT SHOULDER - 2+ VIEW COMPARISON:  None. FINDINGS: There is a comminuted fracture through the humeral head, involving the greater tubercle. No dislocation. No other abnormality. IMPRESSION: Comminuted fracture through the humeral head. Electronically Signed   By: Dorise Bullion III M.D   On: 11/01/2018 23:34   Dg Humerus Left  Result Date: 11/01/2018 CLINICAL DATA:  Pain after fall. EXAM: LEFT HUMERUS - 2+ VIEW COMPARISON:  None. FINDINGS: There is a comminuted fracture through the humeral head. No other abnormalities. IMPRESSION: Comminuted fracture through the humeral head with mild displacement. Electronically Signed   By: Dorise Bullion III M.D   On: 11/01/2018 23:33    Procedures Procedures (including critical care time)  Medications Ordered in ED Medications  oxyCODONE-acetaminophen (PERCOCET/ROXICET) 5-325 MG per tablet 1 tablet (1 tablet Oral Given 11/01/18 2335)     Initial Impression / Assessment and Plan / ED Course  I have reviewed the triage vital signs and the nursing notes.  Pertinent labs & imaging results that were  available during my care of the patient were reviewed by me and considered in my medical decision making (see chart for details).        BP (!) 161/81 (BP Location: Right Arm)   Pulse (!) 121   Temp 97.7 F (36.5 C) (Oral)   Resp 14   Ht 5\' 7"  (1.702 m)   Wt 99.8 kg   SpO2 94%   BMI 34.46 kg/m  Tachycardia 2/2 pain.  Pain medication given.   Final Clinical Impressions(s) / ED Diagnoses   Final diagnoses:  Humeral head fracture, left, closed, initial encounter    ED Discharge Orders         Ordered    oxyCODONE-acetaminophen (PERCOCET) 5-325 MG tablet  Every 6 hours  PRN     11/01/18 2341         11:25 PM Pt had a mechanical fall, injuring her L non dominant arm from the fall.  Pain located primarily to L shoulder and L elbow.  xrays ordered.  Pain medication given.  This is a closed injury, pt is NVI.    11:56 PM Xray demonstrates comminuted fracture through the humeral head with mild displacement.  I discussed this with Dr. Betsey Holiday who recommend shoulder sling and pt to f/u outpt with ortho.  Pain medication prescribed.  Database checked.     Domenic Moras, PA-C 11/01/18 2357    Julianne Rice, MD 11/02/18 2322

## 2018-11-03 ENCOUNTER — Other Ambulatory Visit: Payer: Self-pay | Admitting: Orthopaedic Surgery

## 2018-11-03 DIAGNOSIS — M25512 Pain in left shoulder: Secondary | ICD-10-CM | POA: Diagnosis not present

## 2018-11-09 ENCOUNTER — Ambulatory Visit
Admission: RE | Admit: 2018-11-09 | Discharge: 2018-11-09 | Disposition: A | Discharge status: Home / Self Care | Payer: BLUE CROSS/BLUE SHIELD | Source: Ambulatory Visit | Attending: Orthopaedic Surgery | Admitting: Orthopaedic Surgery

## 2018-11-09 DIAGNOSIS — S42265A Nondisplaced fracture of lesser tuberosity of left humerus, initial encounter for closed fracture: Secondary | ICD-10-CM | POA: Diagnosis not present

## 2018-11-09 DIAGNOSIS — S42255A Nondisplaced fracture of greater tuberosity of left humerus, initial encounter for closed fracture: Secondary | ICD-10-CM | POA: Diagnosis not present

## 2018-11-09 DIAGNOSIS — M25512 Pain in left shoulder: Secondary | ICD-10-CM

## 2018-11-17 DIAGNOSIS — M25512 Pain in left shoulder: Secondary | ICD-10-CM | POA: Diagnosis not present

## 2018-12-08 DIAGNOSIS — M25512 Pain in left shoulder: Secondary | ICD-10-CM | POA: Diagnosis not present

## 2018-12-09 DIAGNOSIS — S42202D Unspecified fracture of upper end of left humerus, subsequent encounter for fracture with routine healing: Secondary | ICD-10-CM | POA: Diagnosis not present

## 2018-12-09 DIAGNOSIS — M25612 Stiffness of left shoulder, not elsewhere classified: Secondary | ICD-10-CM | POA: Diagnosis not present

## 2018-12-09 DIAGNOSIS — M6281 Muscle weakness (generalized): Secondary | ICD-10-CM | POA: Diagnosis not present

## 2018-12-17 DIAGNOSIS — M6281 Muscle weakness (generalized): Secondary | ICD-10-CM | POA: Diagnosis not present

## 2018-12-17 DIAGNOSIS — S42202D Unspecified fracture of upper end of left humerus, subsequent encounter for fracture with routine healing: Secondary | ICD-10-CM | POA: Diagnosis not present

## 2018-12-17 DIAGNOSIS — M25612 Stiffness of left shoulder, not elsewhere classified: Secondary | ICD-10-CM | POA: Diagnosis not present

## 2018-12-21 DIAGNOSIS — M25612 Stiffness of left shoulder, not elsewhere classified: Secondary | ICD-10-CM | POA: Diagnosis not present

## 2018-12-21 DIAGNOSIS — M6281 Muscle weakness (generalized): Secondary | ICD-10-CM | POA: Diagnosis not present

## 2018-12-21 DIAGNOSIS — S42202D Unspecified fracture of upper end of left humerus, subsequent encounter for fracture with routine healing: Secondary | ICD-10-CM | POA: Diagnosis not present

## 2018-12-28 DIAGNOSIS — S42202D Unspecified fracture of upper end of left humerus, subsequent encounter for fracture with routine healing: Secondary | ICD-10-CM | POA: Diagnosis not present

## 2018-12-28 DIAGNOSIS — M6281 Muscle weakness (generalized): Secondary | ICD-10-CM | POA: Diagnosis not present

## 2018-12-28 DIAGNOSIS — M25612 Stiffness of left shoulder, not elsewhere classified: Secondary | ICD-10-CM | POA: Diagnosis not present

## 2018-12-31 DIAGNOSIS — M25612 Stiffness of left shoulder, not elsewhere classified: Secondary | ICD-10-CM | POA: Diagnosis not present

## 2018-12-31 DIAGNOSIS — M6281 Muscle weakness (generalized): Secondary | ICD-10-CM | POA: Diagnosis not present

## 2018-12-31 DIAGNOSIS — S42202D Unspecified fracture of upper end of left humerus, subsequent encounter for fracture with routine healing: Secondary | ICD-10-CM | POA: Diagnosis not present

## 2019-01-05 DIAGNOSIS — M25612 Stiffness of left shoulder, not elsewhere classified: Secondary | ICD-10-CM | POA: Diagnosis not present

## 2019-01-05 DIAGNOSIS — M6281 Muscle weakness (generalized): Secondary | ICD-10-CM | POA: Diagnosis not present

## 2019-01-05 DIAGNOSIS — S42202D Unspecified fracture of upper end of left humerus, subsequent encounter for fracture with routine healing: Secondary | ICD-10-CM | POA: Diagnosis not present

## 2019-01-07 DIAGNOSIS — M25612 Stiffness of left shoulder, not elsewhere classified: Secondary | ICD-10-CM | POA: Diagnosis not present

## 2019-01-07 DIAGNOSIS — S42202D Unspecified fracture of upper end of left humerus, subsequent encounter for fracture with routine healing: Secondary | ICD-10-CM | POA: Diagnosis not present

## 2019-01-07 DIAGNOSIS — M6281 Muscle weakness (generalized): Secondary | ICD-10-CM | POA: Diagnosis not present

## 2019-01-15 DIAGNOSIS — M25612 Stiffness of left shoulder, not elsewhere classified: Secondary | ICD-10-CM | POA: Diagnosis not present

## 2019-01-19 DIAGNOSIS — M25612 Stiffness of left shoulder, not elsewhere classified: Secondary | ICD-10-CM | POA: Diagnosis not present

## 2019-01-19 DIAGNOSIS — S42202D Unspecified fracture of upper end of left humerus, subsequent encounter for fracture with routine healing: Secondary | ICD-10-CM | POA: Diagnosis not present

## 2019-01-19 DIAGNOSIS — M6281 Muscle weakness (generalized): Secondary | ICD-10-CM | POA: Diagnosis not present

## 2019-01-26 DIAGNOSIS — M25612 Stiffness of left shoulder, not elsewhere classified: Secondary | ICD-10-CM | POA: Diagnosis not present

## 2019-01-26 DIAGNOSIS — S42202D Unspecified fracture of upper end of left humerus, subsequent encounter for fracture with routine healing: Secondary | ICD-10-CM | POA: Diagnosis not present

## 2019-01-26 DIAGNOSIS — M6281 Muscle weakness (generalized): Secondary | ICD-10-CM | POA: Diagnosis not present

## 2019-01-28 DIAGNOSIS — M6281 Muscle weakness (generalized): Secondary | ICD-10-CM | POA: Diagnosis not present

## 2019-01-28 DIAGNOSIS — M25612 Stiffness of left shoulder, not elsewhere classified: Secondary | ICD-10-CM | POA: Diagnosis not present

## 2019-01-28 DIAGNOSIS — S42202D Unspecified fracture of upper end of left humerus, subsequent encounter for fracture with routine healing: Secondary | ICD-10-CM | POA: Diagnosis not present

## 2019-02-02 DIAGNOSIS — S42202D Unspecified fracture of upper end of left humerus, subsequent encounter for fracture with routine healing: Secondary | ICD-10-CM | POA: Diagnosis not present

## 2019-02-02 DIAGNOSIS — M25612 Stiffness of left shoulder, not elsewhere classified: Secondary | ICD-10-CM | POA: Diagnosis not present

## 2019-02-02 DIAGNOSIS — M6281 Muscle weakness (generalized): Secondary | ICD-10-CM | POA: Diagnosis not present

## 2019-02-04 DIAGNOSIS — M25612 Stiffness of left shoulder, not elsewhere classified: Secondary | ICD-10-CM | POA: Diagnosis not present

## 2019-02-04 DIAGNOSIS — S42202D Unspecified fracture of upper end of left humerus, subsequent encounter for fracture with routine healing: Secondary | ICD-10-CM | POA: Diagnosis not present

## 2019-02-04 DIAGNOSIS — M6281 Muscle weakness (generalized): Secondary | ICD-10-CM | POA: Diagnosis not present

## 2019-02-11 DIAGNOSIS — M6281 Muscle weakness (generalized): Secondary | ICD-10-CM | POA: Diagnosis not present

## 2019-02-11 DIAGNOSIS — M25612 Stiffness of left shoulder, not elsewhere classified: Secondary | ICD-10-CM | POA: Diagnosis not present

## 2019-02-11 DIAGNOSIS — S42202D Unspecified fracture of upper end of left humerus, subsequent encounter for fracture with routine healing: Secondary | ICD-10-CM | POA: Diagnosis not present

## 2019-02-16 DIAGNOSIS — M25612 Stiffness of left shoulder, not elsewhere classified: Secondary | ICD-10-CM | POA: Diagnosis not present

## 2019-02-16 DIAGNOSIS — S42202D Unspecified fracture of upper end of left humerus, subsequent encounter for fracture with routine healing: Secondary | ICD-10-CM | POA: Diagnosis not present

## 2019-02-16 DIAGNOSIS — M6281 Muscle weakness (generalized): Secondary | ICD-10-CM | POA: Diagnosis not present

## 2019-02-23 DIAGNOSIS — M6281 Muscle weakness (generalized): Secondary | ICD-10-CM | POA: Diagnosis not present

## 2019-02-23 DIAGNOSIS — S42202D Unspecified fracture of upper end of left humerus, subsequent encounter for fracture with routine healing: Secondary | ICD-10-CM | POA: Diagnosis not present

## 2019-02-23 DIAGNOSIS — M25612 Stiffness of left shoulder, not elsewhere classified: Secondary | ICD-10-CM | POA: Diagnosis not present

## 2019-03-02 DIAGNOSIS — M6281 Muscle weakness (generalized): Secondary | ICD-10-CM | POA: Diagnosis not present

## 2019-03-02 DIAGNOSIS — S42202D Unspecified fracture of upper end of left humerus, subsequent encounter for fracture with routine healing: Secondary | ICD-10-CM | POA: Diagnosis not present

## 2019-03-02 DIAGNOSIS — M25612 Stiffness of left shoulder, not elsewhere classified: Secondary | ICD-10-CM | POA: Diagnosis not present

## 2019-03-04 DIAGNOSIS — M25612 Stiffness of left shoulder, not elsewhere classified: Secondary | ICD-10-CM | POA: Diagnosis not present

## 2019-03-04 DIAGNOSIS — M6281 Muscle weakness (generalized): Secondary | ICD-10-CM | POA: Diagnosis not present

## 2019-03-04 DIAGNOSIS — S42202D Unspecified fracture of upper end of left humerus, subsequent encounter for fracture with routine healing: Secondary | ICD-10-CM | POA: Diagnosis not present

## 2019-03-23 DIAGNOSIS — M25612 Stiffness of left shoulder, not elsewhere classified: Secondary | ICD-10-CM | POA: Diagnosis not present

## 2019-06-14 DIAGNOSIS — R300 Dysuria: Secondary | ICD-10-CM | POA: Diagnosis not present

## 2019-06-14 DIAGNOSIS — N3001 Acute cystitis with hematuria: Secondary | ICD-10-CM | POA: Diagnosis not present

## 2019-07-21 DIAGNOSIS — H524 Presbyopia: Secondary | ICD-10-CM | POA: Diagnosis not present

## 2019-07-21 DIAGNOSIS — H26492 Other secondary cataract, left eye: Secondary | ICD-10-CM | POA: Diagnosis not present

## 2019-07-21 DIAGNOSIS — E119 Type 2 diabetes mellitus without complications: Secondary | ICD-10-CM | POA: Diagnosis not present

## 2019-07-21 DIAGNOSIS — H43812 Vitreous degeneration, left eye: Secondary | ICD-10-CM | POA: Diagnosis not present

## 2019-08-16 DIAGNOSIS — H26492 Other secondary cataract, left eye: Secondary | ICD-10-CM | POA: Diagnosis not present

## 2019-09-09 ENCOUNTER — Other Ambulatory Visit: Payer: BLUE CROSS/BLUE SHIELD

## 2019-09-09 DIAGNOSIS — R05 Cough: Secondary | ICD-10-CM | POA: Diagnosis not present

## 2019-09-09 DIAGNOSIS — Z03818 Encounter for observation for suspected exposure to other biological agents ruled out: Secondary | ICD-10-CM | POA: Diagnosis not present

## 2019-09-09 DIAGNOSIS — J069 Acute upper respiratory infection, unspecified: Secondary | ICD-10-CM | POA: Diagnosis not present

## 2019-09-15 DIAGNOSIS — J40 Bronchitis, not specified as acute or chronic: Secondary | ICD-10-CM | POA: Diagnosis not present

## 2019-12-28 DIAGNOSIS — H26492 Other secondary cataract, left eye: Secondary | ICD-10-CM | POA: Diagnosis not present

## 2020-01-29 DIAGNOSIS — H1089 Other conjunctivitis: Secondary | ICD-10-CM | POA: Diagnosis not present

## 2020-01-29 DIAGNOSIS — R0981 Nasal congestion: Secondary | ICD-10-CM | POA: Diagnosis not present

## 2020-02-21 DIAGNOSIS — W57XXXA Bitten or stung by nonvenomous insect and other nonvenomous arthropods, initial encounter: Secondary | ICD-10-CM | POA: Diagnosis not present

## 2020-02-21 DIAGNOSIS — S90562A Insect bite (nonvenomous), left ankle, initial encounter: Secondary | ICD-10-CM | POA: Diagnosis not present

## 2020-03-23 DIAGNOSIS — L237 Allergic contact dermatitis due to plants, except food: Secondary | ICD-10-CM | POA: Diagnosis not present

## 2020-04-05 DIAGNOSIS — E113293 Type 2 diabetes mellitus with mild nonproliferative diabetic retinopathy without macular edema, bilateral: Secondary | ICD-10-CM | POA: Diagnosis not present

## 2020-04-05 DIAGNOSIS — H26491 Other secondary cataract, right eye: Secondary | ICD-10-CM | POA: Diagnosis not present

## 2020-04-05 DIAGNOSIS — Z961 Presence of intraocular lens: Secondary | ICD-10-CM | POA: Diagnosis not present

## 2020-05-23 DIAGNOSIS — H26491 Other secondary cataract, right eye: Secondary | ICD-10-CM | POA: Diagnosis not present

## 2020-06-05 DIAGNOSIS — Z6829 Body mass index (BMI) 29.0-29.9, adult: Secondary | ICD-10-CM | POA: Diagnosis not present

## 2020-06-05 DIAGNOSIS — Z01419 Encounter for gynecological examination (general) (routine) without abnormal findings: Secondary | ICD-10-CM | POA: Diagnosis not present

## 2020-06-14 DIAGNOSIS — Z01419 Encounter for gynecological examination (general) (routine) without abnormal findings: Secondary | ICD-10-CM | POA: Diagnosis not present

## 2020-07-12 DIAGNOSIS — L0889 Other specified local infections of the skin and subcutaneous tissue: Secondary | ICD-10-CM | POA: Diagnosis not present

## 2020-07-12 DIAGNOSIS — L738 Other specified follicular disorders: Secondary | ICD-10-CM | POA: Diagnosis not present

## 2020-11-04 ENCOUNTER — Encounter (HOSPITAL_COMMUNITY): Payer: Self-pay | Admitting: Emergency Medicine

## 2020-11-04 ENCOUNTER — Emergency Department (HOSPITAL_COMMUNITY)
Admission: EM | Admit: 2020-11-04 | Discharge: 2020-11-05 | Disposition: A | Discharge status: Home / Self Care | Payer: BC Managed Care – PPO | Attending: Emergency Medicine | Admitting: Emergency Medicine

## 2020-11-04 DIAGNOSIS — E1165 Type 2 diabetes mellitus with hyperglycemia: Secondary | ICD-10-CM | POA: Insufficient documentation

## 2020-11-04 DIAGNOSIS — Z79899 Other long term (current) drug therapy: Secondary | ICD-10-CM | POA: Diagnosis not present

## 2020-11-04 DIAGNOSIS — R0789 Other chest pain: Secondary | ICD-10-CM

## 2020-11-04 DIAGNOSIS — I493 Ventricular premature depolarization: Secondary | ICD-10-CM | POA: Insufficient documentation

## 2020-11-04 DIAGNOSIS — R079 Chest pain, unspecified: Secondary | ICD-10-CM | POA: Diagnosis not present

## 2020-11-04 DIAGNOSIS — I10 Essential (primary) hypertension: Secondary | ICD-10-CM | POA: Insufficient documentation

## 2020-11-04 DIAGNOSIS — Z87891 Personal history of nicotine dependence: Secondary | ICD-10-CM | POA: Diagnosis not present

## 2020-11-04 DIAGNOSIS — Z794 Long term (current) use of insulin: Secondary | ICD-10-CM | POA: Insufficient documentation

## 2020-11-04 DIAGNOSIS — J9811 Atelectasis: Secondary | ICD-10-CM | POA: Diagnosis not present

## 2020-11-04 DIAGNOSIS — R739 Hyperglycemia, unspecified: Secondary | ICD-10-CM

## 2020-11-04 NOTE — ED Triage Notes (Signed)
Pt transported from home by [facility] with c/o chest pain, pt took ASA 324mg  x 2 since 1930 tonight and pepto. Pt has not been taking meds since losing her sister in October. IV est. Pain 2/10 at this time.

## 2020-11-04 NOTE — ED Notes (Signed)
Pt refusing all testing except "getting my heart checked", pt not willing to give urine sample or allow phleb to draw any "extra" tubes

## 2020-11-05 ENCOUNTER — Emergency Department (HOSPITAL_COMMUNITY): Payer: BC Managed Care – PPO

## 2020-11-05 DIAGNOSIS — J9811 Atelectasis: Secondary | ICD-10-CM | POA: Diagnosis not present

## 2020-11-05 DIAGNOSIS — E1165 Type 2 diabetes mellitus with hyperglycemia: Secondary | ICD-10-CM | POA: Diagnosis not present

## 2020-11-05 DIAGNOSIS — R0789 Other chest pain: Secondary | ICD-10-CM | POA: Diagnosis not present

## 2020-11-05 DIAGNOSIS — R079 Chest pain, unspecified: Secondary | ICD-10-CM | POA: Diagnosis not present

## 2020-11-05 LAB — CBC
HCT: 46 % (ref 36.0–46.0)
Hemoglobin: 15.1 g/dL — ABNORMAL HIGH (ref 12.0–15.0)
MCH: 29.3 pg (ref 26.0–34.0)
MCHC: 32.8 g/dL (ref 30.0–36.0)
MCV: 89.3 fL (ref 80.0–100.0)
Platelets: 209 10*3/uL (ref 150–400)
RBC: 5.15 MIL/uL — ABNORMAL HIGH (ref 3.87–5.11)
RDW: 12.6 % (ref 11.5–15.5)
WBC: 9.6 10*3/uL (ref 4.0–10.5)
nRBC: 0 % (ref 0.0–0.2)

## 2020-11-05 LAB — BASIC METABOLIC PANEL
Anion gap: 13 (ref 5–15)
BUN: 20 mg/dL (ref 6–20)
CO2: 22 mmol/L (ref 22–32)
Calcium: 9.5 mg/dL (ref 8.9–10.3)
Chloride: 98 mmol/L (ref 98–111)
Creatinine, Ser: 0.59 mg/dL (ref 0.44–1.00)
GFR, Estimated: >60 mL/min (ref >60)
Glucose, Bld: 294 mg/dL — ABNORMAL HIGH (ref 70–99)
Potassium: 4.4 mmol/L (ref 3.5–5.1)
Sodium: 133 mmol/L — ABNORMAL LOW (ref 135–145)

## 2020-11-05 LAB — TROPONIN I (HIGH SENSITIVITY)
Troponin I (High Sensitivity): 7 ng/L (ref <18)
Troponin I (High Sensitivity): 7 ng/L (ref <18)

## 2020-11-05 MED ORDER — METOPROLOL SUCCINATE ER 100 MG PO TB24: 100.0000 mg | ORAL_TABLET | Freq: Every day | ORAL | 0 refills | Status: DC

## 2020-11-05 MED ORDER — TRULICITY 0.75 MG/0.5ML ~~LOC~~ SOAJ: 0.7500 mg | SUBCUTANEOUS | 1 refills | Status: DC

## 2020-11-05 NOTE — ED Provider Notes (Signed)
[facility] EMERGENCY DEPARTMENT Provider Note   CSN: [id number] Arrival date & time: 11/04/20  2239     History Chief Complaint  Patient presents with  . Chest Pain    Courtney Parks is a 60 y.o. female.  60yo F w/ PMH including HTN, T2DM, HLD, anxiety/depression who p/w heart racing and chest pain. She was sitting on the couch this evening and started feeling off. She then began feeling winded, felt erratic pulse, and with a left sided chest pain cutting to her left scapula. She had associated L arm tingling. She took ASA and pepto prior to arrival. Pain has waxed and waned since it began. She continues to feel occasional heart racing/skipped beat. Pain is currently 2/10 and is only present when she's feeling the irregular heart beats. She denies any leg swelling/pain, recent travel, estrogen use, h/o blood clots, or hx of cancer. FH notable for mother with heart problems in her 88s.   The history is provided by the patient.  Chest Pain      Past Medical History:  Diagnosis Date  . Anxiety   . Depression   . Diabetes mellitus, type II ([place])   . Hyperlipidemia   . Hypertension   . Kidney stones     Patient Active Problem List   Diagnosis Date Noted  . Depression, major, recurrent, moderate ([place]) 02/07/2015    Class: Chronic  . Anxiety and depression 12/28/2013  . Status post laparoscopic cholecystectomy 03/29/2012  . Insulin dependent diabetes mellitus 01/01/2012  . Hyperlipidemia 01/01/2012  . Hypertension 01/01/2012  . History of kidney stones 01/01/2012  . COLONIC POLYPS, ADENOMATOUS, HX OF 05/27/2008    Past Surgical History:  Procedure Laterality Date  . CHOLECYSTECTOMY  02/28/2012   Procedure: LAPAROSCOPIC CHOLECYSTECTOMY WITH INTRAOPERATIVE CHOLANGIOGRAM;  Surgeon: Imogene Burn. Georgette Dover, MD;  Location: [facility];  Service: General;  Laterality: N/A;  . ULNAR NERVE REPAIR    . URETERAL STENT PLACEMENT       OB History   No obstetric  history on file.     Family History  Problem Relation Age of Onset  . Diabetes Mother   . Hypertension Mother   . Colon cancer Mother   . Hypertension Father   . ADD / ADHD Maternal Grandmother     Social History   Tobacco Use  . Smoking status: Former Smoker    Years: 5.00    Types: Cigarettes    Quit date: 12/12/1983    Years since quitting: 36.9  . Smokeless tobacco: Never Used  Substance Use Topics  . Alcohol use: No  . Drug use: No    Home Medications Prior to Admission medications   Medication Sig Start Date End Date Taking? Authorizing Provider  diphenhydramine-acetaminophen (TYLENOL PM) 25-500 MG TABS tablet Take 2 tablets by mouth at bedtime as needed (PAIN).    [provider]  Dulaglutide (TRULICITY) 0.17 BL/3.9QZ SOPN Inject 0.75 mg into the skin once a week. 11/05/20   Kalia Vahey, Wenda Overland, MD  metoprolol succinate (TOPROL-XL) 100 MG 24 hr tablet Take 1 tablet (100 mg total) by mouth daily. Take with or immediately following a meal. 11/05/20   Elide Stalzer, Wenda Overland, MD  Multiple Vitamin (MULTIVITAMIN) capsule Take 1 capsule by mouth daily.    [provider]  OVER THE COUNTER MEDICATION Take 1 tablet by mouth daily. DR [place]    [provider]  oxyCODONE-acetaminophen (PERCOCET) 5-325 MG tablet Take 1 tablet by mouth every 6 (  six) hours as needed for severe pain. 11/01/18   Domenic Moras, PA-C    Allergies    Codeine, Demerol, and Shellfish allergy  Review of Systems   Review of Systems  Cardiovascular: Positive for chest pain.   All other systems reviewed and are negative except that which was mentioned in HPI  Physical Exam Updated Vital Signs BP 129/69   Pulse 87   Temp 97.9 F (36.6 C) (Oral)   Resp 18   Ht 5' 6.5" (1.689 m)   Wt 80.3 kg   SpO2 100%   BMI 28.14 kg/m   Physical Exam Constitutional:      General: She is not in acute distress.    Appearance: Normal appearance.  HENT:     Head:  Normocephalic and atraumatic.  Eyes:     Conjunctiva/sclera: Conjunctivae normal.  Cardiovascular:     Rate and Rhythm: Normal rate.     Heart sounds: Normal heart sounds. No murmur heard.     Comments: Occasional irregular beat Pulmonary:     Effort: Pulmonary effort is normal.     Breath sounds: Normal breath sounds.  Abdominal:     General: Abdomen is flat. Bowel sounds are normal. There is no distension.     Palpations: Abdomen is soft.     Tenderness: There is no abdominal tenderness.  Musculoskeletal:     Right lower leg: No edema.     Left lower leg: No edema.  Skin:    General: Skin is warm and dry.  Neurological:     Mental Status: She is alert and oriented to person, place, and time.     Comments: fluent  Psychiatric:        Mood and Affect: Mood normal.        Behavior: Behavior normal.     ED Results / Procedures / Treatments   Labs (all labs ordered are listed, but only abnormal results are displayed) Labs Reviewed  BASIC METABOLIC PANEL - Abnormal; Notable for the following components:      Result Value   Sodium 133 (*)    Glucose, Bld 294 (*)    All other components within normal limits  CBC - Abnormal; Notable for the following components:   RBC 5.15 (*)    Hemoglobin 15.1 (*)    All other components within normal limits  TROPONIN I (HIGH SENSITIVITY)  TROPONIN I (HIGH SENSITIVITY)  TROPONIN I (HIGH SENSITIVITY)    EKG EKG Interpretation  Date/Time:  Saturday November 04 2020 22:48:45 EST Ventricular Rate:  113 PR Interval:  158 QRS Duration: 68 QT Interval:  344 QTC Calculation: 471 R Axis:   69 Text Interpretation: Sinus tachycardia with Premature atrial complexes Septal infarct , age undetermined Abnormal ECG rate faster than previous Confirmed by Theotis Burrow [phone]) on 11/05/2020 4:48:17 AM   Radiology DG Chest 2 View  Result Date: 11/05/2020 CLINICAL DATA:  Chest pain EXAM: CHEST - 2 VIEW COMPARISON:  Radiograph 03/13/2018, CT  09/14/2003 FINDINGS: Low volumes with some streaky atelectasis. No consolidation, features of edema, pneumothorax, or effusion. Pulmonary vascularity is normally distributed. The cardiomediastinal contours are unremarkable. No acute osseous or soft tissue abnormality. IMPRESSION: Atelectasis without other acute cardiopulmonary abnormality. Electronically Signed   By: Lovena Le M.D.   On: 11/05/2020 01:50    Procedures Procedures   Medications Ordered in ED Medications - No data to display  ED Course  I have reviewed the triage vital signs and the nursing notes.  Pertinent labs &  imaging results that were available during my care of the patient were reviewed by me and considered in my medical decision making (see chart for details).    MDM Rules/Calculators/A&P                          Well-appearing and comfortable on exam with reassuring vital signs.  EKG shows sinus rhythm, no ischemic changes.  She is having occasional PVCs.  She seems to be having her sensations when throwing PVCs on the monitor thus I suspect most of her symptoms are due to the intermittent PVCs.  Lab work shows negative serial troponins, glucose 294 likely due to the fact that she has been off of her medications for a while.  Chest x-ray clear.  Given that most of her symptoms seem to surround the irregular heartbeat sensation, I feel that ACS is very unlikely. Have given refills of metoprolol and trulicity and encouraged to f/u with PCP as well as to be seen in cardiology clinic. She feels comfortable with this plan and voiced understanding of return precautions. Final Clinical Impression(s) / ED Diagnoses Final diagnoses:  Frequent PVCs  Atypical chest pain  Hyperglycemia    Rx / DC Orders ED Discharge Orders         Ordered    Dulaglutide (TRULICITY) 9.67 RF/1.6BW SOPN  Weekly        11/05/20 0747    metoprolol succinate (TOPROL-XL) 100 MG 24 hr tablet  Daily        11/05/20 0747           Cerenity Goshorn,  Wenda Overland, MD 11/05/20 [phone]

## 2021-02-28 DIAGNOSIS — H6500 Acute serous otitis media, unspecified ear: Secondary | ICD-10-CM | POA: Diagnosis not present

## 2021-03-11 DIAGNOSIS — H6501 Acute serous otitis media, right ear: Secondary | ICD-10-CM | POA: Diagnosis not present

## 2021-05-29 DIAGNOSIS — M9905 Segmental and somatic dysfunction of pelvic region: Secondary | ICD-10-CM | POA: Diagnosis not present

## 2021-05-29 DIAGNOSIS — M5116 Intervertebral disc disorders with radiculopathy, lumbar region: Secondary | ICD-10-CM | POA: Diagnosis not present

## 2021-05-29 DIAGNOSIS — M9903 Segmental and somatic dysfunction of lumbar region: Secondary | ICD-10-CM | POA: Diagnosis not present

## 2021-05-29 DIAGNOSIS — M25551 Pain in right hip: Secondary | ICD-10-CM | POA: Diagnosis not present

## 2021-05-31 DIAGNOSIS — M25551 Pain in right hip: Secondary | ICD-10-CM | POA: Diagnosis not present

## 2021-05-31 DIAGNOSIS — M5116 Intervertebral disc disorders with radiculopathy, lumbar region: Secondary | ICD-10-CM | POA: Diagnosis not present

## 2021-05-31 DIAGNOSIS — M9905 Segmental and somatic dysfunction of pelvic region: Secondary | ICD-10-CM | POA: Diagnosis not present

## 2021-05-31 DIAGNOSIS — M9903 Segmental and somatic dysfunction of lumbar region: Secondary | ICD-10-CM | POA: Diagnosis not present

## 2021-06-05 DIAGNOSIS — M5116 Intervertebral disc disorders with radiculopathy, lumbar region: Secondary | ICD-10-CM | POA: Diagnosis not present

## 2021-06-05 DIAGNOSIS — M25551 Pain in right hip: Secondary | ICD-10-CM | POA: Diagnosis not present

## 2021-06-05 DIAGNOSIS — M9903 Segmental and somatic dysfunction of lumbar region: Secondary | ICD-10-CM | POA: Diagnosis not present

## 2021-06-05 DIAGNOSIS — M9905 Segmental and somatic dysfunction of pelvic region: Secondary | ICD-10-CM | POA: Diagnosis not present

## 2021-06-11 DIAGNOSIS — M9903 Segmental and somatic dysfunction of lumbar region: Secondary | ICD-10-CM | POA: Diagnosis not present

## 2021-06-11 DIAGNOSIS — M25551 Pain in right hip: Secondary | ICD-10-CM | POA: Diagnosis not present

## 2021-06-11 DIAGNOSIS — M9905 Segmental and somatic dysfunction of pelvic region: Secondary | ICD-10-CM | POA: Diagnosis not present

## 2021-06-11 DIAGNOSIS — M5116 Intervertebral disc disorders with radiculopathy, lumbar region: Secondary | ICD-10-CM | POA: Diagnosis not present

## 2021-06-12 DIAGNOSIS — M9903 Segmental and somatic dysfunction of lumbar region: Secondary | ICD-10-CM | POA: Diagnosis not present

## 2021-06-12 DIAGNOSIS — M9905 Segmental and somatic dysfunction of pelvic region: Secondary | ICD-10-CM | POA: Diagnosis not present

## 2021-06-12 DIAGNOSIS — M25551 Pain in right hip: Secondary | ICD-10-CM | POA: Diagnosis not present

## 2021-06-12 DIAGNOSIS — M5116 Intervertebral disc disorders with radiculopathy, lumbar region: Secondary | ICD-10-CM | POA: Diagnosis not present

## 2021-06-19 DIAGNOSIS — M9905 Segmental and somatic dysfunction of pelvic region: Secondary | ICD-10-CM | POA: Diagnosis not present

## 2021-06-19 DIAGNOSIS — M25551 Pain in right hip: Secondary | ICD-10-CM | POA: Diagnosis not present

## 2021-06-19 DIAGNOSIS — M9903 Segmental and somatic dysfunction of lumbar region: Secondary | ICD-10-CM | POA: Diagnosis not present

## 2021-06-19 DIAGNOSIS — M5116 Intervertebral disc disorders with radiculopathy, lumbar region: Secondary | ICD-10-CM | POA: Diagnosis not present

## 2021-06-21 DIAGNOSIS — M25551 Pain in right hip: Secondary | ICD-10-CM | POA: Diagnosis not present

## 2021-06-21 DIAGNOSIS — M5116 Intervertebral disc disorders with radiculopathy, lumbar region: Secondary | ICD-10-CM | POA: Diagnosis not present

## 2021-06-21 DIAGNOSIS — M9905 Segmental and somatic dysfunction of pelvic region: Secondary | ICD-10-CM | POA: Diagnosis not present

## 2021-06-21 DIAGNOSIS — M9903 Segmental and somatic dysfunction of lumbar region: Secondary | ICD-10-CM | POA: Diagnosis not present

## 2021-06-25 DIAGNOSIS — M9903 Segmental and somatic dysfunction of lumbar region: Secondary | ICD-10-CM | POA: Diagnosis not present

## 2021-06-25 DIAGNOSIS — M25551 Pain in right hip: Secondary | ICD-10-CM | POA: Diagnosis not present

## 2021-06-25 DIAGNOSIS — M5116 Intervertebral disc disorders with radiculopathy, lumbar region: Secondary | ICD-10-CM | POA: Diagnosis not present

## 2021-06-25 DIAGNOSIS — M9905 Segmental and somatic dysfunction of pelvic region: Secondary | ICD-10-CM | POA: Diagnosis not present

## 2021-06-27 DIAGNOSIS — M9905 Segmental and somatic dysfunction of pelvic region: Secondary | ICD-10-CM | POA: Diagnosis not present

## 2021-06-27 DIAGNOSIS — M9903 Segmental and somatic dysfunction of lumbar region: Secondary | ICD-10-CM | POA: Diagnosis not present

## 2021-06-27 DIAGNOSIS — M5116 Intervertebral disc disorders with radiculopathy, lumbar region: Secondary | ICD-10-CM | POA: Diagnosis not present

## 2021-06-27 DIAGNOSIS — M25551 Pain in right hip: Secondary | ICD-10-CM | POA: Diagnosis not present

## 2021-07-05 DIAGNOSIS — M25551 Pain in right hip: Secondary | ICD-10-CM | POA: Diagnosis not present

## 2021-07-05 DIAGNOSIS — M9905 Segmental and somatic dysfunction of pelvic region: Secondary | ICD-10-CM | POA: Diagnosis not present

## 2021-07-05 DIAGNOSIS — M9903 Segmental and somatic dysfunction of lumbar region: Secondary | ICD-10-CM | POA: Diagnosis not present

## 2021-07-05 DIAGNOSIS — M5116 Intervertebral disc disorders with radiculopathy, lumbar region: Secondary | ICD-10-CM | POA: Diagnosis not present

## 2021-07-09 DIAGNOSIS — M9903 Segmental and somatic dysfunction of lumbar region: Secondary | ICD-10-CM | POA: Diagnosis not present

## 2021-07-09 DIAGNOSIS — M25551 Pain in right hip: Secondary | ICD-10-CM | POA: Diagnosis not present

## 2021-07-09 DIAGNOSIS — M9905 Segmental and somatic dysfunction of pelvic region: Secondary | ICD-10-CM | POA: Diagnosis not present

## 2021-07-09 DIAGNOSIS — M5116 Intervertebral disc disorders with radiculopathy, lumbar region: Secondary | ICD-10-CM | POA: Diagnosis not present

## 2021-07-10 ENCOUNTER — Other Ambulatory Visit: Payer: Self-pay

## 2021-07-10 ENCOUNTER — Encounter (HOSPITAL_BASED_OUTPATIENT_CLINIC_OR_DEPARTMENT_OTHER): Payer: Self-pay | Admitting: Emergency Medicine

## 2021-07-10 ENCOUNTER — Telehealth: Payer: BC Managed Care – PPO | Admitting: Physician Assistant

## 2021-07-10 ENCOUNTER — Emergency Department (HOSPITAL_BASED_OUTPATIENT_CLINIC_OR_DEPARTMENT_OTHER)
Admission: EM | Admit: 2021-07-10 | Discharge: 2021-07-10 | Disposition: A | Discharge status: Home / Self Care | Payer: BC Managed Care – PPO | Attending: Emergency Medicine | Admitting: Emergency Medicine

## 2021-07-10 ENCOUNTER — Emergency Department (HOSPITAL_BASED_OUTPATIENT_CLINIC_OR_DEPARTMENT_OTHER): Payer: BC Managed Care – PPO

## 2021-07-10 DIAGNOSIS — I10 Essential (primary) hypertension: Secondary | ICD-10-CM | POA: Diagnosis not present

## 2021-07-10 DIAGNOSIS — K047 Periapical abscess without sinus: Secondary | ICD-10-CM | POA: Diagnosis not present

## 2021-07-10 DIAGNOSIS — N200 Calculus of kidney: Secondary | ICD-10-CM | POA: Diagnosis not present

## 2021-07-10 DIAGNOSIS — R1110 Vomiting, unspecified: Secondary | ICD-10-CM | POA: Diagnosis not present

## 2021-07-10 DIAGNOSIS — R109 Unspecified abdominal pain: Secondary | ICD-10-CM | POA: Diagnosis not present

## 2021-07-10 DIAGNOSIS — N3000 Acute cystitis without hematuria: Secondary | ICD-10-CM | POA: Insufficient documentation

## 2021-07-10 DIAGNOSIS — Z87891 Personal history of nicotine dependence: Secondary | ICD-10-CM | POA: Insufficient documentation

## 2021-07-10 DIAGNOSIS — Z794 Long term (current) use of insulin: Secondary | ICD-10-CM | POA: Diagnosis not present

## 2021-07-10 DIAGNOSIS — Z79899 Other long term (current) drug therapy: Secondary | ICD-10-CM | POA: Insufficient documentation

## 2021-07-10 DIAGNOSIS — I959 Hypotension, unspecified: Secondary | ICD-10-CM | POA: Diagnosis not present

## 2021-07-10 DIAGNOSIS — E1169 Type 2 diabetes mellitus with other specified complication: Secondary | ICD-10-CM | POA: Insufficient documentation

## 2021-07-10 DIAGNOSIS — R1084 Generalized abdominal pain: Secondary | ICD-10-CM | POA: Diagnosis not present

## 2021-07-10 LAB — URINALYSIS, ROUTINE W REFLEX MICROSCOPIC
Bilirubin Urine: NEGATIVE
Glucose, UA: >1000 mg/dL — AB
Ketones, ur: 40 mg/dL — AB
Leukocytes,Ua: NEGATIVE
Nitrite: POSITIVE — AB
Protein, ur: NEGATIVE mg/dL
Specific Gravity, Urine: 1.035 — ABNORMAL HIGH (ref 1.005–1.030)
pH: 6.5 (ref 5.0–8.0)

## 2021-07-10 LAB — BASIC METABOLIC PANEL
Anion gap: 13 (ref 5–15)
BUN: 10 mg/dL (ref 6–20)
CO2: 22 mmol/L (ref 22–32)
Calcium: 9.4 mg/dL (ref 8.9–10.3)
Chloride: 102 mmol/L (ref 98–111)
Creatinine, Ser: 0.33 mg/dL — ABNORMAL LOW (ref 0.44–1.00)
GFR, Estimated: >60 mL/min (ref >60)
Glucose, Bld: 408 mg/dL — ABNORMAL HIGH (ref 70–99)
Potassium: 3.5 mmol/L (ref 3.5–5.1)
Sodium: 137 mmol/L (ref 135–145)

## 2021-07-10 LAB — CBC
HCT: 42 % (ref 36.0–46.0)
Hemoglobin: 14 g/dL (ref 12.0–15.0)
MCH: 28.7 pg (ref 26.0–34.0)
MCHC: 33.3 g/dL (ref 30.0–36.0)
MCV: 86.2 fL (ref 80.0–100.0)
Platelets: 150 10*3/uL (ref 150–400)
RBC: 4.87 MIL/uL (ref 3.87–5.11)
RDW: 12.6 % (ref 11.5–15.5)
WBC: 11 10*3/uL — ABNORMAL HIGH (ref 4.0–10.5)
nRBC: 0 % (ref 0.0–0.2)

## 2021-07-10 MED ORDER — CEFPODOXIME PROXETIL 200 MG PO TABS: 200.0000 mg | ORAL_TABLET | Freq: Two times a day (BID) | ORAL | 0 refills | Status: DC

## 2021-07-10 NOTE — Progress Notes (Signed)
Virtual Visit Consent   Courtney Parks, you are scheduled for a virtual visit with a [facility] provider today.     Just as with appointments in the office, your consent must be obtained to participate.  Your consent will be active for this visit and any virtual visit you may have with one of our providers in the next 365 days.     If you have a MyChart account, a copy of this consent can be sent to you electronically.  All virtual visits are billed to your insurance company just like a traditional visit in the office.    As this is a virtual visit, video technology does not allow for your provider to perform a traditional examination.  This may limit your provider's ability to fully assess your condition.  If your provider identifies any concerns that need to be evaluated in person or the need to arrange testing (such as labs, EKG, etc.), we will make arrangements to do so.     Although advances in technology are sophisticated, we cannot ensure that it will always work on either your end or our end.  If the connection with a video visit is poor, the visit may have to be switched to a telephone visit.  With either a video or telephone visit, we are not always able to ensure that we have a secure connection.     I need to obtain your verbal consent now.   Are you willing to proceed with your visit today?    Courtney Parks has provided verbal consent on 07/10/2021 for a virtual visit (video or telephone).   Courtney Parks, [place]   Date: 07/10/2021 10:40 AM   Virtual Visit via Video Note   I, Courtney Parks, connected with  CAMIYAH FRIBERG  ([id number], 1961/06/12) on 07/10/21 at 10:30 AM EST by a video-enabled telemedicine application and verified that I am speaking with the correct person using two identifiers.  Location: Patient: Virtual Visit Location Patient: Home Provider: Virtual Visit Location Provider: Home Office   I discussed the limitations  of evaluation and management by telemedicine and the availability of in person appointments. The patient expressed understanding and agreed to proceed.    History of Present Illness: Courtney Parks is a 60 y.o. who identifies as a female who was assigned female at birth, and is being seen today for worsening dental pain with likely abscess. Notes she cannot get in currently to get tooth pulled. However as of this morning she is having substantial chills and rigors (shaking in bed at time of interview), now with R flank pain (history of nephrolithiasis) and intractable non-bloody emesis. States she is in so much pain and feels week. Thankfully her son is there with her now and present for the entirety of visit.  HPI: HPI  Problems:  Patient Active Problem List   Diagnosis Date Noted   Depression, major, recurrent, moderate ([place]) 02/07/2015    Class: Chronic   Anxiety and depression 12/28/2013   Status post laparoscopic cholecystectomy 03/29/2012   Insulin dependent diabetes mellitus 01/01/2012   Hyperlipidemia 01/01/2012   Hypertension 01/01/2012   History of kidney stones 01/01/2012   COLONIC POLYPS, ADENOMATOUS, HX OF 05/27/2008    Allergies:  Allergies  Allergen Reactions   Codeine     REACTION: Nausea   Demerol Nausea And Vomiting   Shellfish Allergy Swelling   Medications:  Current Outpatient Medications:    diphenhydramine-acetaminophen (TYLENOL PM) 25-500 MG TABS tablet,  Take 2 tablets by mouth at bedtime as needed (PAIN)., Disp: , Rfl:    Dulaglutide (TRULICITY) 4.65 KP/5.4SF SOPN, Inject 0.75 mg into the skin once a week., Disp: 0.5 mL, Rfl: 1   metoprolol succinate (TOPROL-XL) 100 MG 24 hr tablet, Take 1 tablet (100 mg total) by mouth daily. Take with or immediately following a meal., Disp: 90 tablet, Rfl: 0   Multiple Vitamin (MULTIVITAMIN) capsule, Take 1 capsule by mouth daily., Disp: , Rfl:    OVER THE COUNTER MEDICATION, Take 1 tablet by mouth daily. DR  [facility] HERBAL SUPPLEMENT, Disp: , Rfl:    oxyCODONE-acetaminophen (PERCOCET) 5-325 MG tablet, Take 1 tablet by mouth every 6 (six) hours as needed for severe pain., Disp: 20 tablet, Rfl: 0  Observations/Objective: Patient is well-developed, well-nourished in painful acute distress.  Restless on bed at home with notable rigors. Head is normocephalic, atraumatic.  No labored breathing. Speech is clear and coherent with logical content.  Patient is alert and oriented at baseline.   Assessment and Plan: 1. Flank pain  2. Intractable vomiting  3. Dental infection  Unclear if multifactorial due to her dental abscess and a moving kidney stone as she has known nephrolithiasis. More likely more complicated infection with potential start of pyelonephritis as pain is constant. Need ER evaluation. Her son is with her but she is not stable enough in my opinion for car ride. Son calling EMS while this provider talking to patient further. EMS dispatched to patient home.    Follow Up Instructions: I discussed the assessment and treatment plan with the patient. The patient was provided an opportunity to ask questions and all were answered. The patient agreed with the plan and demonstrated an understanding of the instructions.  A copy of instructions were sent to the patient via MyChart unless otherwise noted below.   The patient was advised to call back or seek an in-person evaluation if the symptoms worsen or if the condition fails to improve as anticipated.  Time:  I spent 12 minutes with the patient via telehealth technology discussing the above problems/concerns.    Courtney Rio, PA-C

## 2021-07-10 NOTE — ED Notes (Signed)
Pt ambulatory to bathroom, standby assistance.  

## 2021-07-10 NOTE — Discharge Instructions (Addendum)
Your work-up today did not reveal any ureteral stones.  However, given your significant history of kidney stones I believe this is likely you passed one prior to arrival. Your CT scan did reveal nonobstructive kidney stones in both kidneys. It is possible he passed a prior to arrival and that is why we did not see it on imaging.  I have given you cefpodoxime for urinary tract infection.  Your blood sugar was elevated in the department today.  They have you to follow-up with your primary care doctor for further evaluation and possible medication adjustment.  Please turn to the emergency department if you experience recurrent pain, fevers, increased urinary symptoms, or any other concern you might have.

## 2021-07-10 NOTE — ED Triage Notes (Signed)
Pt arrives to ED via [facility] with c/o of right sided flank pain that started this morning. The pain awoke her from sleep. The pain is sharp/stabbing. EMS gave 151mcg of Fentanyl and 4mg  Zofran.

## 2021-07-10 NOTE — ED Notes (Signed)
D/c paperwork reviewed with pt, including prescription. Pt verbalized understanding, no questions at time of d/c. Ambulatory to ED exit.

## 2021-07-10 NOTE — ED Provider Notes (Signed)
[facility] EMERGENCY DEPT Provider Note   CSN: [id number] Arrival date & time: 07/10/21  1115     History Chief Complaint  Patient presents with   Flank Pain    Courtney Parks is a 60 y.o. female with history of nephrolithiasis who presents to the emergency department today with right-sided flank pain that radiates into the right sided abdomen that began earlier this morning.  She describes her flank pain as sharp stabbing pain which waxes and wanes.  She states this feels identical to her previous kidney stones in the past.  She rates her flank pain at time of onset a 10/10 in severity.  Currently rates her pain mild in severity.  She reports associated chills, nausea, vomiting, and urinary frequency.  She denies fevers, chest pain, shortness of breath, vaginal symptoms, dysuria, urinary urgency.   Flank Pain      Past Medical History:  Diagnosis Date   Anxiety    Depression    Diabetes mellitus, type II ([place])    Hyperlipidemia    Hypertension    Kidney stones     Patient Active Problem List   Diagnosis Date Noted   Depression, major, recurrent, moderate ([place]) 02/07/2015    Class: Chronic   Anxiety and depression 12/28/2013   Status post laparoscopic cholecystectomy 03/29/2012   Insulin dependent diabetes mellitus 01/01/2012   Hyperlipidemia 01/01/2012   Hypertension 01/01/2012   History of kidney stones 01/01/2012   COLONIC POLYPS, ADENOMATOUS, HX OF 05/27/2008    Past Surgical History:  Procedure Laterality Date   CHOLECYSTECTOMY  02/28/2012   Procedure: LAPAROSCOPIC CHOLECYSTECTOMY WITH INTRAOPERATIVE CHOLANGIOGRAM;  Surgeon: Imogene Burn. Georgette Dover, MD;  Location: [facility];  Service: General;  Laterality: N/A;   ULNAR NERVE REPAIR     URETERAL STENT PLACEMENT       OB History   No obstetric history on file.     Family History  Problem Relation Age of Onset   Diabetes Mother    Hypertension Mother    Colon cancer Mother    Hypertension  Father    ADD / ADHD Maternal Grandmother     Social History   Tobacco Use   Smoking status: Former    Years: 5.00    Types: Cigarettes    Quit date: 12/12/1983    Years since quitting: 37.6   Smokeless tobacco: Never  Substance Use Topics   Alcohol use: No   Drug use: No    Home Medications Prior to Admission medications   Medication Sig Start Date End Date Taking? Authorizing Provider  Dulaglutide (TRULICITY) 1.88 CZ/6.6AY SOPN Inject 0.75 mg into the skin once a week. 11/05/20  Yes Little, Wenda Overland, MD  metoprolol succinate (TOPROL-XL) 100 MG 24 hr tablet Take 1 tablet (100 mg total) by mouth daily. Take with or immediately following a meal. 11/05/20  Yes Little, Wenda Overland, MD  Multiple Vitamin (MULTIVITAMIN) capsule Take 1 capsule by mouth daily.   Yes [provider]  OVER THE COUNTER MEDICATION Take 1 tablet by mouth daily. DR Burton   Yes [provider]  cefpodoxime (VANTIN) 200 MG tablet Take 1 tablet (200 mg total) by mouth 2 (two) times daily. 07/10/21  Yes Manila Rommel M, PA-C  diphenhydramine-acetaminophen (TYLENOL PM) 25-500 MG TABS tablet Take 2 tablets by mouth at bedtime as needed (PAIN).    [provider]  oxyCODONE-acetaminophen (PERCOCET) 5-325 MG tablet Take 1 tablet by mouth every 6 (six) hours as needed for severe  pain. Patient not taking: Reported on 07/10/2021 11/01/18   Domenic Moras, PA-C    Allergies    Codeine, Demerol, Other, and Shellfish allergy  Review of Systems   Review of Systems  Genitourinary:  Positive for flank pain. Negative for vaginal bleeding and vaginal discharge.  All other systems reviewed and are negative.  Physical Exam Updated Vital Signs BP (!) 125/56   Pulse (!) 103   Temp 99.5 F (37.5 C) (Oral)   Resp 16   Ht 5' 6.5" (1.689 m)   Wt 81.2 kg   SpO2 99%   BMI 28.46 kg/m   Physical Exam Vitals and nursing note reviewed.  Constitutional:      General: She is not in  acute distress.    Appearance: Normal appearance.  HENT:     Head: Normocephalic and atraumatic.  Eyes:     General:        Right eye: No discharge.        Left eye: No discharge.  Cardiovascular:     Comments: Regular rate and rhythm.  S1/S2 are distinct without any evidence of murmur, rubs, or gallops.  Radial pulses are 2+ bilaterally.  Dorsalis pedis pulses are 2+ bilaterally.  No evidence of pedal edema. Pulmonary:     Comments: Clear to auscultation bilaterally.  Normal effort.  No respiratory distress.  No evidence of wheezes, rales, or rhonchi heard throughout. Abdominal:     General: Abdomen is flat. Bowel sounds are normal. There is no distension.     Tenderness: There is no guarding or rebound.     Comments: Mild right lower quadrant abdominal tenderness.  No CVA tenderness bilaterally. No obvious peritoneal signs  Musculoskeletal:        General: Normal range of motion.     Cervical back: Neck supple.  Skin:    General: Skin is warm and dry.     Findings: No rash.  Neurological:     General: No focal deficit present.     Mental Status: She is alert.  Psychiatric:        Mood and Affect: Mood normal.        Behavior: Behavior normal.    ED Results / Procedures / Treatments   Labs (all labs ordered are listed, but only abnormal results are displayed) Labs Reviewed  BASIC METABOLIC PANEL - Abnormal; Notable for the following components:      Result Value   Glucose, Bld 408 (*)    Creatinine, Ser 0.33 (*)    All other components within normal limits  CBC - Abnormal; Notable for the following components:   WBC 11.0 (*)    All other components within normal limits  URINALYSIS, ROUTINE W REFLEX MICROSCOPIC - Abnormal; Notable for the following components:   Specific Gravity, Urine 1.035 (*)    Glucose, UA >1,000 (*)    Hgb urine dipstick TRACE (*)    Ketones, ur 40 (*)    Nitrite POSITIVE (*)    Bacteria, UA MANY (*)    All other components within normal limits     EKG None  Radiology CT Renal Stone Study  Result Date: 07/10/2021 CLINICAL DATA:  Flank pain, right. Sharp stabbing pain. History of kidney stones. The EXAM: CT ABDOMEN AND PELVIS WITHOUT CONTRAST TECHNIQUE: Multidetector CT imaging of the abdomen and pelvis was performed following the standard protocol without IV contrast. COMPARISON:  CT examination dated November 17, 2017 FINDINGS: Lower chest: No acute abnormality. Hepatobiliary: No focal liver abnormality  is seen. Status post cholecystectomy. No biliary dilatation. Pancreas: Unremarkable. No pancreatic ductal dilatation or surrounding inflammatory changes. Spleen: Normal in size without focal abnormality. Adrenals/Urinary Tract: Adrenal glands are unremarkable. Multiple 2-3 mm nonobstructing calculi in the bilateral kidneys. No evidence of hydronephrosis or ureteral calculi bilaterally. Mild bilateral perinephric fat stranding. Bladder is unremarkable. Stomach/Bowel: Stomach is within normal limits. Appendix appears normal. No evidence of bowel wall thickening, distention, or inflammatory changes. Vascular/Lymphatic: Aortic atherosclerosis. No enlarged abdominal or pelvic lymph nodes. Reproductive: Uterus and bilateral adnexa are unremarkable. Other: No abdominal wall hernia or abnormality. No abdominopelvic ascites. Musculoskeletal: No acute or significant osseous findings. IMPRESSION: 1. Bilateral 2-3 mm nonobstructing renal calculi without evidence of hydronephrosis or ureteral calculus. No evidence of recently passed calculus. 2.  Status post cholecystectomy. 3.  Normal appendix.  No evidence of colitis or diverticulitis. Electronically Signed   By: Keane Police D.O.   On: 07/10/2021 12:36    Procedures Procedures   Medications Ordered in ED Medications - No data to display  ED Course  I have reviewed the triage vital signs and the nursing notes.  Pertinent labs & imaging results that were available during my care of the patient were  reviewed by me and considered in my medical decision making (see chart for details).    MDM Rules/Calculators/A&P                          Courtney Parks is a 60 y.o. female with history of nephrolithiasis who presents to the emergency department for evaluation of right-sided flank pain.  Her pain resolved essentially prior to arrival.  History and physical exam is consistent with ureterolithiasis.  Her work-up was initiated in triage.  CBC revealed leukocytosis which could be a stress response.  CMP revealed hyperglycemia.  Further discussion with the patient revealed that this typically happens when she has kidney stones or urinary tract infection.  Her sugars typically run anywhere from 100-160.  We had a conversation about drinking plenty of fluids as the patient wishes to go home. She has been compliant with her medications and is currently asymptomatic.  Instructed that she needs to likely visit her primary care doctor for possible medication adjustment.  Urinalysis reveals urinary tract infection.  CT renal study did not show any evidence of ureteral stone at this time.  However, patient thinks she might of passed it prior to arrival.  Given that her pain is resolved and she is feeling much better, I believe that she would benefit from outpatient follow-up.  She is safe for discharge.  Tylenol/ibuprofen for pain control.  I will also place her on cefpodoxime given her history of diabetes.  Patient expressed full understanding of safer discharge.   Final Clinical Impression(s) / ED Diagnoses Final diagnoses:  Flank pain  Acute cystitis without hematuria    Rx / DC Orders ED Discharge Orders          Ordered    cefpodoxime (VANTIN) 200 MG tablet  2 times daily        07/10/21 1532             Myna Bright [place], [place] 07/10/21 1541    Wyvonnia Dusky, MD 07/11/21 [phone]

## 2021-07-17 DIAGNOSIS — M5116 Intervertebral disc disorders with radiculopathy, lumbar region: Secondary | ICD-10-CM | POA: Diagnosis not present

## 2021-07-17 DIAGNOSIS — M9903 Segmental and somatic dysfunction of lumbar region: Secondary | ICD-10-CM | POA: Diagnosis not present

## 2021-07-17 DIAGNOSIS — M25551 Pain in right hip: Secondary | ICD-10-CM | POA: Diagnosis not present

## 2021-07-17 DIAGNOSIS — M9905 Segmental and somatic dysfunction of pelvic region: Secondary | ICD-10-CM | POA: Diagnosis not present

## 2021-07-30 DIAGNOSIS — M9905 Segmental and somatic dysfunction of pelvic region: Secondary | ICD-10-CM | POA: Diagnosis not present

## 2021-07-30 DIAGNOSIS — M9903 Segmental and somatic dysfunction of lumbar region: Secondary | ICD-10-CM | POA: Diagnosis not present

## 2021-07-30 DIAGNOSIS — M5116 Intervertebral disc disorders with radiculopathy, lumbar region: Secondary | ICD-10-CM | POA: Diagnosis not present

## 2021-07-30 DIAGNOSIS — M25551 Pain in right hip: Secondary | ICD-10-CM | POA: Diagnosis not present

## 2021-08-03 IMAGING — DX DG CHEST 2V
2 series · 2 of 2 positions shown · non-contrast
Comparison: Radiograph 03/13/2018, CT 09/14/2003

CLINICAL DATA: Chest pain

EXAM:
CHEST - 2 VIEW

[chest pa]
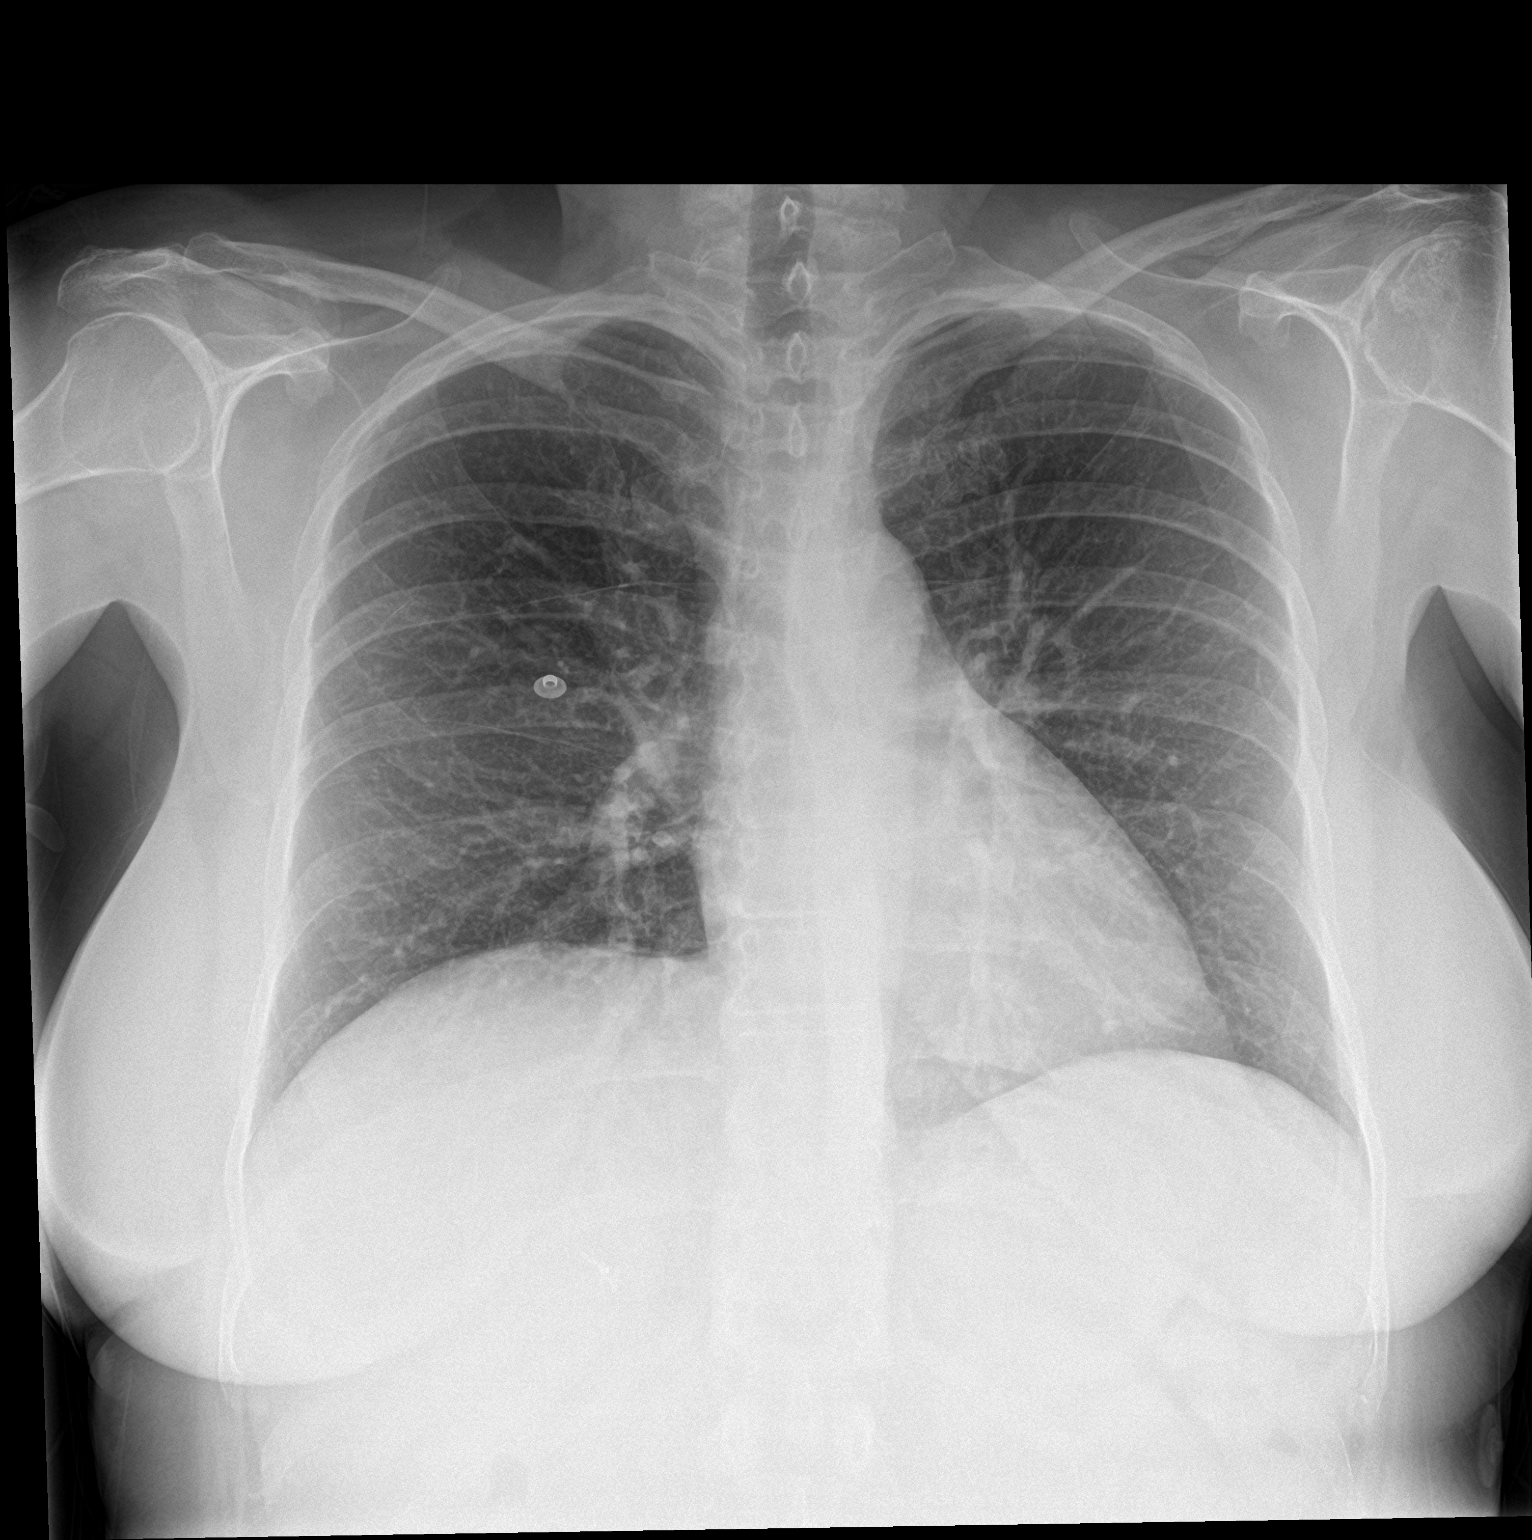

[chest lat]
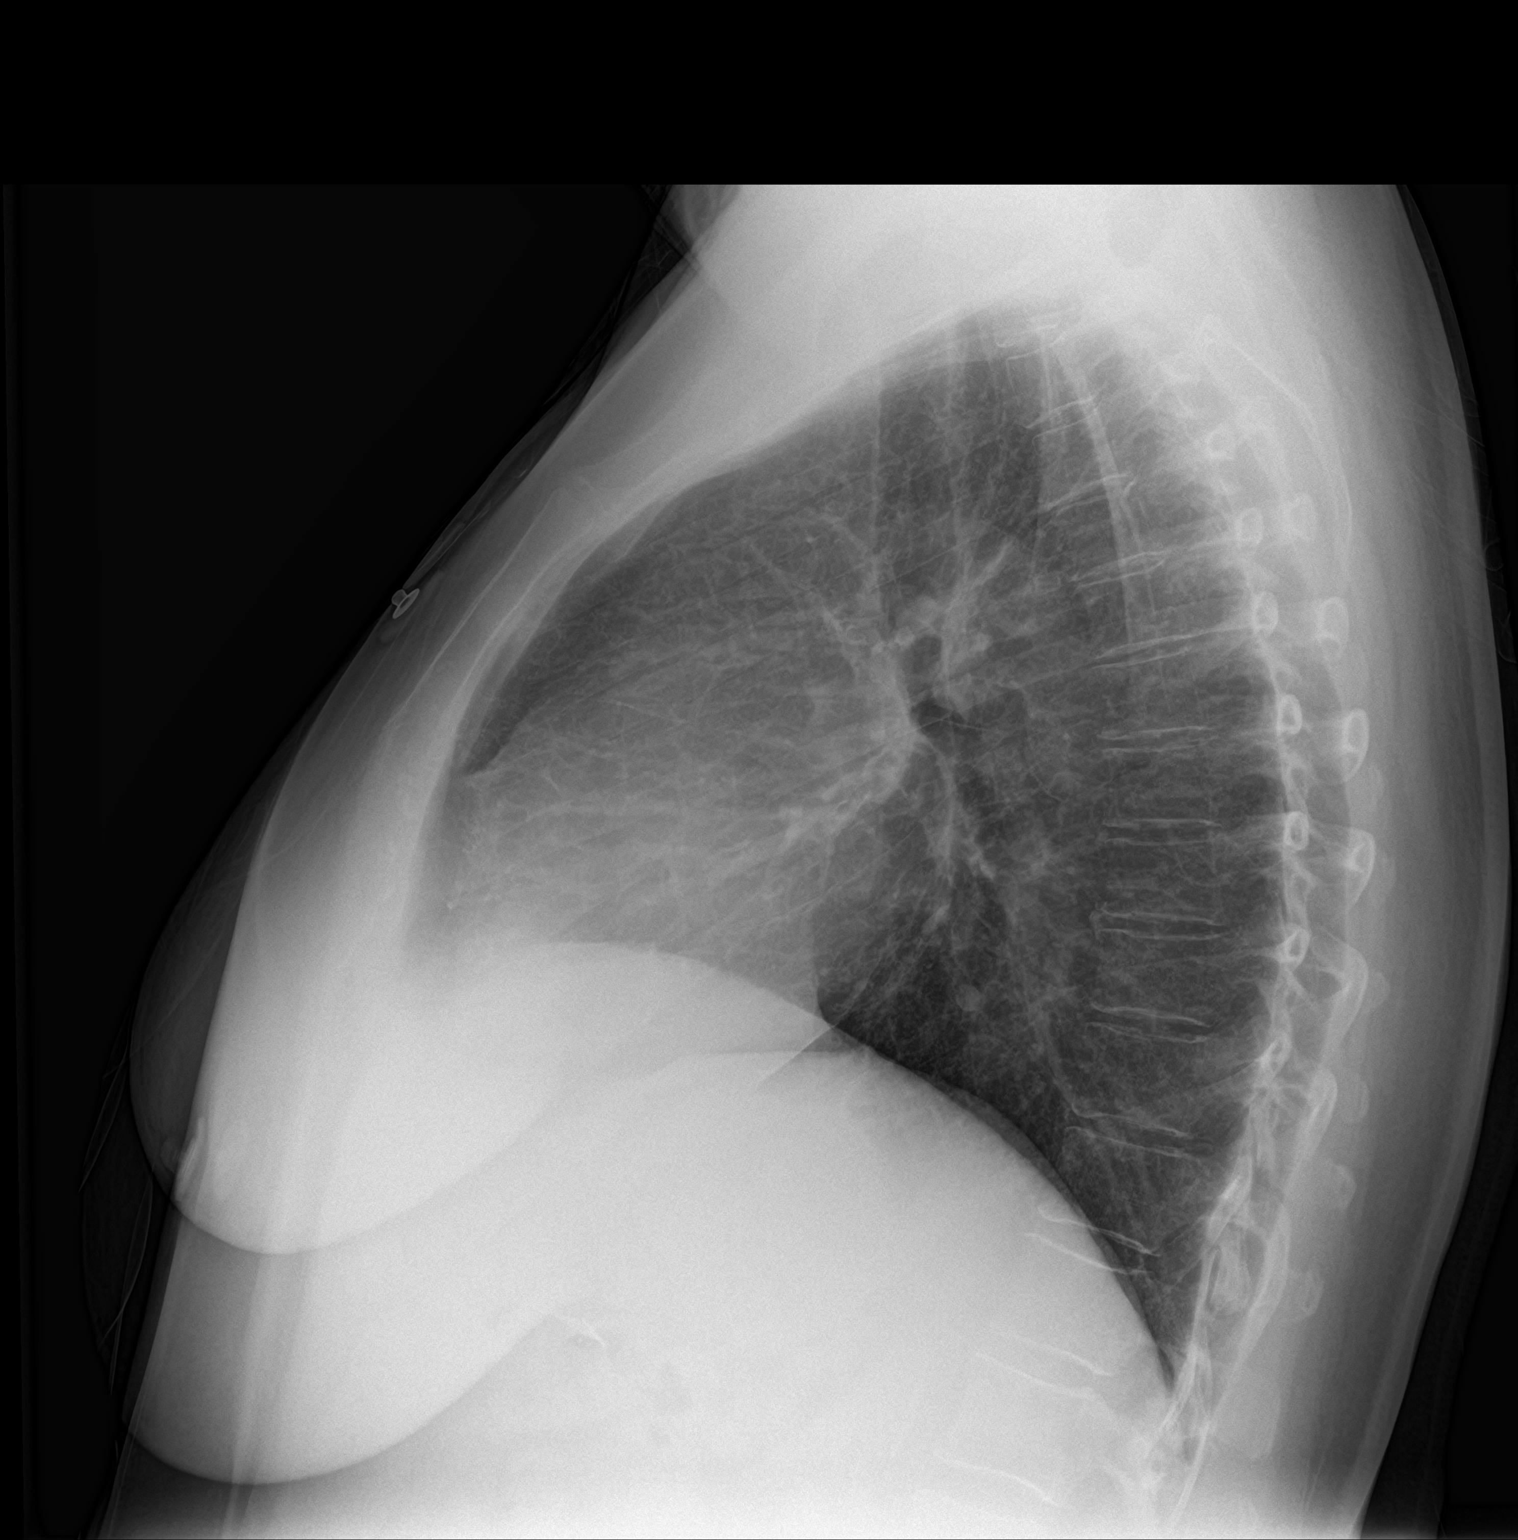

[2 of 2 positions shown; findings below may reference images not displayed]

FINDINGS: Low volumes with some streaky atelectasis. No consolidation,
features of edema, pneumothorax, or effusion. Pulmonary vascularity
is normally distributed. The cardiomediastinal contours are
unremarkable. No acute osseous or soft tissue abnormality.
IMPRESSION: Atelectasis without other acute cardiopulmonary abnormality.

## 2021-08-12 ENCOUNTER — Encounter (HOSPITAL_BASED_OUTPATIENT_CLINIC_OR_DEPARTMENT_OTHER): Payer: Self-pay

## 2021-08-12 ENCOUNTER — Emergency Department (HOSPITAL_BASED_OUTPATIENT_CLINIC_OR_DEPARTMENT_OTHER)
Admission: EM | Admit: 2021-08-12 | Discharge: 2021-08-12 | Disposition: A | Discharge status: Home / Self Care | Payer: BC Managed Care – PPO | Attending: Emergency Medicine | Admitting: Emergency Medicine

## 2021-08-12 ENCOUNTER — Other Ambulatory Visit: Payer: Self-pay

## 2021-08-12 DIAGNOSIS — I10 Essential (primary) hypertension: Secondary | ICD-10-CM | POA: Insufficient documentation

## 2021-08-12 DIAGNOSIS — Z79899 Other long term (current) drug therapy: Secondary | ICD-10-CM | POA: Diagnosis not present

## 2021-08-12 DIAGNOSIS — Z9114 Patient's other noncompliance with medication regimen: Secondary | ICD-10-CM | POA: Insufficient documentation

## 2021-08-12 DIAGNOSIS — Z794 Long term (current) use of insulin: Secondary | ICD-10-CM | POA: Insufficient documentation

## 2021-08-12 DIAGNOSIS — R739 Hyperglycemia, unspecified: Secondary | ICD-10-CM | POA: Diagnosis not present

## 2021-08-12 DIAGNOSIS — E876 Hypokalemia: Secondary | ICD-10-CM | POA: Diagnosis not present

## 2021-08-12 DIAGNOSIS — Z87891 Personal history of nicotine dependence: Secondary | ICD-10-CM | POA: Diagnosis not present

## 2021-08-12 DIAGNOSIS — E1165 Type 2 diabetes mellitus with hyperglycemia: Secondary | ICD-10-CM | POA: Diagnosis not present

## 2021-08-12 LAB — COMPREHENSIVE METABOLIC PANEL
ALT: 26 U/L (ref 0–44)
AST: 15 U/L (ref 15–41)
Albumin: 4 g/dL (ref 3.5–5.0)
Alkaline Phosphatase: 62 U/L (ref 38–126)
Anion gap: 12 (ref 5–15)
BUN: 8 mg/dL (ref 6–20)
CO2: 22 mmol/L (ref 22–32)
Calcium: 8.9 mg/dL (ref 8.9–10.3)
Chloride: 100 mmol/L (ref 98–111)
Creatinine, Ser: 0.45 mg/dL (ref 0.44–1.00)
GFR, Estimated: >60 mL/min (ref >60)
Glucose, Bld: 356 mg/dL — ABNORMAL HIGH (ref 70–99)
Potassium: 3.3 mmol/L — ABNORMAL LOW (ref 3.5–5.1)
Sodium: 134 mmol/L — ABNORMAL LOW (ref 135–145)
Total Bilirubin: 0.9 mg/dL (ref 0.3–1.2)
Total Protein: 6.5 g/dL (ref 6.5–8.1)

## 2021-08-12 LAB — URINALYSIS, ROUTINE W REFLEX MICROSCOPIC
Bilirubin Urine: NEGATIVE
Glucose, UA: >1000 mg/dL — AB
Ketones, ur: >80 mg/dL — AB
Nitrite: POSITIVE — AB
RBC / HPF: >50 RBC/hpf — ABNORMAL HIGH (ref 0–5)
Specific Gravity, Urine: 1.041 — ABNORMAL HIGH (ref 1.005–1.030)
WBC, UA: >50 WBC/hpf — ABNORMAL HIGH (ref 0–5)
pH: 5.5 (ref 5.0–8.0)

## 2021-08-12 LAB — LIPID PANEL
Cholesterol: 212 mg/dL — ABNORMAL HIGH (ref 0–200)
HDL: 47 mg/dL (ref >40)
LDL Cholesterol: 148 mg/dL — ABNORMAL HIGH (ref 0–99)
Total CHOL/HDL Ratio: 4.5 RATIO
Triglycerides: 87 mg/dL (ref <150)
VLDL: 17 mg/dL (ref 0–40)

## 2021-08-12 LAB — CBC WITH DIFFERENTIAL/PLATELET
Abs Immature Granulocytes: 0.03 10*3/uL (ref 0.00–0.07)
Basophils Absolute: 0 10*3/uL (ref 0.0–0.1)
Basophils Relative: 0 %
Eosinophils Absolute: 0.1 10*3/uL (ref 0.0–0.5)
Eosinophils Relative: 1 %
HCT: 42.2 % (ref 36.0–46.0)
Hemoglobin: 14.4 g/dL (ref 12.0–15.0)
Immature Granulocytes: 0 %
Lymphocytes Relative: 6 %
Lymphs Abs: 0.6 10*3/uL — ABNORMAL LOW (ref 0.7–4.0)
MCH: 29.1 pg (ref 26.0–34.0)
MCHC: 34.1 g/dL (ref 30.0–36.0)
MCV: 85.4 fL (ref 80.0–100.0)
Monocytes Absolute: 0.2 10*3/uL (ref 0.1–1.0)
Monocytes Relative: 2 %
Neutro Abs: 9 10*3/uL — ABNORMAL HIGH (ref 1.7–7.7)
Neutrophils Relative %: 91 %
Platelets: 153 10*3/uL (ref 150–400)
RBC: 4.94 MIL/uL (ref 3.87–5.11)
RDW: 12.7 % (ref 11.5–15.5)
WBC: 9.9 10*3/uL (ref 4.0–10.5)
nRBC: 0 % (ref 0.0–0.2)

## 2021-08-12 LAB — HEMOGLOBIN A1C
Hgb A1c MFr Bld: 12.6 % — ABNORMAL HIGH (ref 4.8–5.6)
Mean Plasma Glucose: 314.92 mg/dL

## 2021-08-12 LAB — CBG MONITORING, ED
Glucose-Capillary: 327 mg/dL — ABNORMAL HIGH (ref 70–99)
Glucose-Capillary: 333 mg/dL — ABNORMAL HIGH (ref 70–99)

## 2021-08-12 MED ORDER — POTASSIUM CHLORIDE CRYS ER 20 MEQ PO TBCR: 20.0000 meq | EXTENDED_RELEASE_TABLET | Freq: Two times a day (BID) | ORAL | 0 refills | Status: DC

## 2021-08-12 MED ORDER — TRULICITY 0.75 MG/0.5ML ~~LOC~~ SOAJ: 0.7500 mg | SUBCUTANEOUS | 0 refills | Status: DC

## 2021-08-12 MED ORDER — POTASSIUM CHLORIDE CRYS ER 20 MEQ PO TBCR
40.0000 meq | EXTENDED_RELEASE_TABLET | Freq: Once | ORAL | Status: AC
  Administered 2021-08-12: 40 meq via ORAL
  Filled 2021-08-12: qty 2

## 2021-08-12 MED ORDER — LACTATED RINGERS IV BOLUS
1000.0000 mL | Freq: Once | INTRAVENOUS | Status: AC
  Administered 2021-08-12: 1000 mL via INTRAVENOUS

## 2021-08-12 NOTE — Discharge Instructions (Addendum)
Please make sure to continue taking your Trulicity unless a physician changes your medication regimen.  Please make arrangements for appropriate counseling to help you get through this difficult time.

## 2021-08-12 NOTE — ED Provider Notes (Signed)
[facility] Provider Note   CSN: [id number] Arrival date & time: 08/12/21  5009     History Chief Complaint  Patient presents with   Hyperglycemia    Courtney Parks is a 60 y.o. female.  The history is provided by the patient.  Hyperglycemia She has history of hypertension, diabetes, hyperlipidemia, depression and comes in because she generally feels better noted her blood sugar was very high tonight.  She states that she has not been taking her medication for at least 2 years.  She has been somewhat depressed since her sister died last month shortly after diagnosis of cancer.  Blood sugar tonight was over 500 which got her concerned enough to come in to be seen.  She had been on dulaglutide in the past which did a good job of controlling her blood sugar.  Metformin was not tolerated because of diarrhea.  She had been on metoprolol in the past, but states that her heart rate and blood pressure have been consistently good except when she is in pain or under stress.  She currently does not have a primary care provider, but does have a gynecologist.   Past Medical History:  Diagnosis Date   Anxiety    Depression    Diabetes mellitus, type II ([place])    Hyperlipidemia    Hypertension    Kidney stones     Patient Active Problem List   Diagnosis Date Noted   Depression, major, recurrent, moderate ([place]) 02/07/2015    Class: Chronic   Anxiety and depression 12/28/2013   Status post laparoscopic cholecystectomy 03/29/2012   Insulin dependent diabetes mellitus 01/01/2012   Hyperlipidemia 01/01/2012   Hypertension 01/01/2012   History of kidney stones 01/01/2012   COLONIC POLYPS, ADENOMATOUS, HX OF 05/27/2008    Past Surgical History:  Procedure Laterality Date   CHOLECYSTECTOMY  02/28/2012   Procedure: LAPAROSCOPIC CHOLECYSTECTOMY WITH INTRAOPERATIVE CHOLANGIOGRAM;  Surgeon: Imogene Burn. Georgette Dover, MD;  Location: [facility];  Service: General;   Laterality: N/A;   ULNAR NERVE REPAIR     URETERAL STENT PLACEMENT       OB History   No obstetric history on file.     Family History  Problem Relation Age of Onset   Diabetes Mother    Hypertension Mother    Colon cancer Mother    Hypertension Father    ADD / ADHD Maternal Grandmother     Social History   Tobacco Use   Smoking status: Former    Years: 5.00    Types: Cigarettes    Quit date: 12/12/1983    Years since quitting: 37.6   Smokeless tobacco: Never  Substance Use Topics   Alcohol use: No   Drug use: No    Home Medications Prior to Admission medications   Medication Sig Start Date End Date Taking? Authorizing Provider  cefpodoxime (VANTIN) 200 MG tablet Take 1 tablet (200 mg total) by mouth 2 (two) times daily. 07/10/21   Myna Bright M, PA-C  diphenhydramine-acetaminophen (TYLENOL PM) 25-500 MG TABS tablet Take 2 tablets by mouth at bedtime as needed (PAIN).    [provider]  Dulaglutide (TRULICITY) 3.81 WE/9.9BZ SOPN Inject 0.75 mg into the skin once a week. 11/05/20   Little, Wenda Overland, MD  metoprolol succinate (TOPROL-XL) 100 MG 24 hr tablet Take 1 tablet (100 mg total) by mouth daily. Take with or immediately following a meal. 11/05/20   Little, Wenda Overland, MD  Multiple Vitamin (MULTIVITAMIN) capsule Take 1 capsule  by mouth daily.    [provider]  OVER THE COUNTER MEDICATION Take 1 tablet by mouth daily. DR Harper    [provider]  oxyCODONE-acetaminophen (PERCOCET) 5-325 MG tablet Take 1 tablet by mouth every 6 (six) hours as needed for severe pain. Patient not taking: Reported on 07/10/2021 11/01/18   Domenic Moras, PA-C    Allergies    Codeine, Demerol, Other, and Shellfish allergy  Review of Systems   Review of Systems  All other systems reviewed and are negative.  Physical Exam Updated Vital Signs BP (!) 172/77 (BP Location: Right Arm)   Pulse (!) 115   Temp 98.9 F (37.2 C) (Oral)    Resp 18   Ht 5\' 6"  (1.676 m)   Wt 81.2 kg   SpO2 99%   BMI 28.89 kg/m   Physical Exam Vitals and nursing note reviewed.  60 year old female, resting comfortably and in no acute distress. Vital signs are significant for elevated blood pressure and heart rate. Oxygen saturation is 99%, which is normal. Head is normocephalic and atraumatic. PERRLA, EOMI. Oropharynx is clear. Neck is nontender and supple without adenopathy or JVD. Back is nontender and there is no CVA tenderness. Lungs are clear without rales, wheezes, or rhonchi. Chest is nontender. Heart has regular rate and rhythm without murmur. Abdomen is soft, flat, nontender. Extremities have no cyanosis or edema, full range of motion is present. Skin is warm and dry without rash. Neurologic: Mental status is normal, cranial nerves are intact, moves all extremities equally.  ED Results / Procedures / Treatments   Labs (all labs ordered are listed, but only abnormal results are displayed) Labs Reviewed  COMPREHENSIVE METABOLIC PANEL - Abnormal; Notable for the following components:      Result Value   Sodium 134 (*)    Potassium 3.3 (*)    Glucose, Bld 356 (*)    All other components within normal limits  CBC WITH DIFFERENTIAL/PLATELET - Abnormal; Notable for the following components:   Neutro Abs 9.0 (*)    Lymphs Abs 0.6 (*)    All other components within normal limits  URINALYSIS, ROUTINE W REFLEX MICROSCOPIC - Abnormal; Notable for the following components:   APPearance HAZY (*)    Specific Gravity, Urine 1.041 (*)    Glucose, UA >1,000 (*)    Hgb urine dipstick MODERATE (*)    Ketones, ur >80 (*)    Protein, ur TRACE (*)    Nitrite POSITIVE (*)    Leukocytes,Ua LARGE (*)    RBC / HPF >50 (*)    WBC, UA >50 (*)    Bacteria, UA MANY (*)    All other components within normal limits  CBG MONITORING, ED - Abnormal; Notable for the following components:   Glucose-Capillary 327 (*)    All other components within  normal limits  HEMOGLOBIN A1C  LIPID PANEL  CBG MONITORING, ED   Procedures Procedures   Medications Ordered in ED Medications  potassium chloride SA (KLOR-CON M) CR tablet 40 mEq (has no administration in time range)  lactated ringers bolus 1,000 mL (0 mLs Intravenous Stopped 08/12/21 0631)    ED Course  I have reviewed the triage vital signs and the nursing notes.  Pertinent labs & imaging results that were available during my care of the patient were reviewed by me and considered in my medical decision making (see chart for details).    MDM Rules/Calculators/A&P  Hyperglycemia with medication noncompliance.  Fingerstick glucose in the emergency department is significantly lower at 327.  She is tachycardic and hypertensive at triage, but blood pressure has come down to normal and heart rate has decreased significantly since being placed in a treatment room.  Old records are reviewed, and recent ED visits and clinic visits have demonstrated normal blood pressure.  At current glucose level, she does not need emergent lowering.  She will be given IV fluids and will check screening labs.  Since she expresses a desire to obtain a primary care provider, hemoglobin A1c and lipid panel are drawn for baseline.  Labs show mild hypokalemia and hyponatremia.  Hypokalemia is felt likely due to osmotic diuresis from persistent hyperglycemia.  Urine has greater than 50 WBCs and greater than 50 RBCs and many bacteria, but also budding yeast.  This is felt to likely be due to vaginal contamination, so no antibiotics are prescribed.  She is given a dose of oral potassium and discharged with prescription for K-door as well as a prescription for 1 month supply of her dulaglutide.  She is encouraged to obtain counseling for her problems dealing with current life events.  Importance of medication compliance was stressed.  Of note, blood pressure has come down while in the emergency  department.  Final Clinical Impression(s) / ED Diagnoses Final diagnoses:  Hyperglycemia  Noncompliance with medication regimen  Hypokalemia    Rx / DC Orders ED Discharge Orders          Ordered    Dulaglutide (TRULICITY) 7.73 PV/6.6KD SOPN  Weekly        08/12/21 0359    potassium chloride SA (KLOR-CON M) 20 MEQ tablet  2 times daily        08/12/21 5947             Delora Fuel, MD [birth year] [phone]

## 2021-08-12 NOTE — ED Triage Notes (Signed)
Complaining of high blood sugar tonight of 5oo at home, hx of diabetes. Has not had a doctors appointment since Thanksgiving of last year. Supposed to be on trulicity, and has not been taking it.

## 2021-08-15 DIAGNOSIS — M9905 Segmental and somatic dysfunction of pelvic region: Secondary | ICD-10-CM | POA: Diagnosis not present

## 2021-08-15 DIAGNOSIS — M5116 Intervertebral disc disorders with radiculopathy, lumbar region: Secondary | ICD-10-CM | POA: Diagnosis not present

## 2021-08-15 DIAGNOSIS — M25551 Pain in right hip: Secondary | ICD-10-CM | POA: Diagnosis not present

## 2021-08-15 DIAGNOSIS — M9903 Segmental and somatic dysfunction of lumbar region: Secondary | ICD-10-CM | POA: Diagnosis not present

## 2021-08-29 DIAGNOSIS — Z6829 Body mass index (BMI) 29.0-29.9, adult: Secondary | ICD-10-CM | POA: Diagnosis not present

## 2021-08-29 DIAGNOSIS — Z01419 Encounter for gynecological examination (general) (routine) without abnormal findings: Secondary | ICD-10-CM | POA: Diagnosis not present

## 2021-10-15 DIAGNOSIS — Z1589 Genetic susceptibility to other disease: Secondary | ICD-10-CM | POA: Diagnosis not present

## 2022-02-03 DIAGNOSIS — N3000 Acute cystitis without hematuria: Secondary | ICD-10-CM | POA: Diagnosis not present

## 2022-02-03 DIAGNOSIS — R300 Dysuria: Secondary | ICD-10-CM | POA: Diagnosis not present

## 2022-03-29 ENCOUNTER — Other Ambulatory Visit: Payer: Self-pay

## 2022-03-29 ENCOUNTER — Telehealth (HOSPITAL_COMMUNITY): Payer: Self-pay | Admitting: Pharmacy Technician

## 2022-03-29 ENCOUNTER — Emergency Department (HOSPITAL_BASED_OUTPATIENT_CLINIC_OR_DEPARTMENT_OTHER): Payer: BC Managed Care – PPO

## 2022-03-29 ENCOUNTER — Other Ambulatory Visit (HOSPITAL_COMMUNITY): Payer: Self-pay

## 2022-03-29 ENCOUNTER — Inpatient Hospital Stay (HOSPITAL_BASED_OUTPATIENT_CLINIC_OR_DEPARTMENT_OTHER)
Admission: EM | Admit: 2022-03-29 | Discharge: 2022-04-05 | DRG: 853 | Disposition: A | Discharge status: Home Health | Payer: BC Managed Care – PPO | Attending: Internal Medicine | Admitting: Internal Medicine

## 2022-03-29 ENCOUNTER — Observation Stay (HOSPITAL_COMMUNITY): Payer: BC Managed Care – PPO

## 2022-03-29 ENCOUNTER — Encounter (HOSPITAL_BASED_OUTPATIENT_CLINIC_OR_DEPARTMENT_OTHER): Payer: Self-pay | Admitting: Emergency Medicine

## 2022-03-29 DIAGNOSIS — L02611 Cutaneous abscess of right foot: Secondary | ICD-10-CM | POA: Diagnosis present

## 2022-03-29 DIAGNOSIS — I159 Secondary hypertension, unspecified: Secondary | ICD-10-CM | POA: Diagnosis not present

## 2022-03-29 DIAGNOSIS — E785 Hyperlipidemia, unspecified: Secondary | ICD-10-CM | POA: Diagnosis not present

## 2022-03-29 DIAGNOSIS — X58XXXA Exposure to other specified factors, initial encounter: Secondary | ICD-10-CM | POA: Diagnosis present

## 2022-03-29 DIAGNOSIS — Y929 Unspecified place or not applicable: Secondary | ICD-10-CM

## 2022-03-29 DIAGNOSIS — E11628 Type 2 diabetes mellitus with other skin complications: Secondary | ICD-10-CM

## 2022-03-29 DIAGNOSIS — S91301A Unspecified open wound, right foot, initial encounter: Secondary | ICD-10-CM | POA: Diagnosis not present

## 2022-03-29 DIAGNOSIS — Z833 Family history of diabetes mellitus: Secondary | ICD-10-CM

## 2022-03-29 DIAGNOSIS — K760 Fatty (change of) liver, not elsewhere classified: Secondary | ICD-10-CM | POA: Diagnosis present

## 2022-03-29 DIAGNOSIS — N281 Cyst of kidney, acquired: Secondary | ICD-10-CM | POA: Diagnosis not present

## 2022-03-29 DIAGNOSIS — Z888 Allergy status to other drugs, medicaments and biological substances status: Secondary | ICD-10-CM | POA: Diagnosis not present

## 2022-03-29 DIAGNOSIS — E1110 Type 2 diabetes mellitus with ketoacidosis without coma: Secondary | ICD-10-CM | POA: Diagnosis present

## 2022-03-29 DIAGNOSIS — Z87442 Personal history of urinary calculi: Secondary | ICD-10-CM | POA: Diagnosis present

## 2022-03-29 DIAGNOSIS — N390 Urinary tract infection, site not specified: Secondary | ICD-10-CM | POA: Diagnosis not present

## 2022-03-29 DIAGNOSIS — Z91013 Allergy to seafood: Secondary | ICD-10-CM

## 2022-03-29 DIAGNOSIS — R809 Proteinuria, unspecified: Secondary | ICD-10-CM | POA: Diagnosis present

## 2022-03-29 DIAGNOSIS — E1165 Type 2 diabetes mellitus with hyperglycemia: Secondary | ICD-10-CM | POA: Diagnosis not present

## 2022-03-29 DIAGNOSIS — E1010 Type 1 diabetes mellitus with ketoacidosis without coma: Principal | ICD-10-CM

## 2022-03-29 DIAGNOSIS — L089 Local infection of the skin and subcutaneous tissue, unspecified: Secondary | ICD-10-CM

## 2022-03-29 DIAGNOSIS — N200 Calculus of kidney: Secondary | ICD-10-CM | POA: Diagnosis present

## 2022-03-29 DIAGNOSIS — S91331A Puncture wound without foreign body, right foot, initial encounter: Secondary | ICD-10-CM | POA: Diagnosis not present

## 2022-03-29 DIAGNOSIS — Z87891 Personal history of nicotine dependence: Secondary | ICD-10-CM

## 2022-03-29 DIAGNOSIS — E663 Overweight: Secondary | ICD-10-CM | POA: Diagnosis present

## 2022-03-29 DIAGNOSIS — A419 Sepsis, unspecified organism: Secondary | ICD-10-CM | POA: Diagnosis not present

## 2022-03-29 DIAGNOSIS — L03115 Cellulitis of right lower limb: Secondary | ICD-10-CM | POA: Diagnosis not present

## 2022-03-29 DIAGNOSIS — Z8673 Personal history of transient ischemic attack (TIA), and cerebral infarction without residual deficits: Secondary | ICD-10-CM

## 2022-03-29 DIAGNOSIS — R600 Localized edema: Secondary | ICD-10-CM | POA: Diagnosis not present

## 2022-03-29 DIAGNOSIS — I10 Essential (primary) hypertension: Secondary | ICD-10-CM | POA: Diagnosis present

## 2022-03-29 DIAGNOSIS — L0390 Cellulitis, unspecified: Secondary | ICD-10-CM | POA: Diagnosis not present

## 2022-03-29 DIAGNOSIS — Z8249 Family history of ischemic heart disease and other diseases of the circulatory system: Secondary | ICD-10-CM | POA: Diagnosis not present

## 2022-03-29 DIAGNOSIS — F32A Depression, unspecified: Secondary | ICD-10-CM | POA: Diagnosis present

## 2022-03-29 DIAGNOSIS — Z885 Allergy status to narcotic agent status: Secondary | ICD-10-CM

## 2022-03-29 DIAGNOSIS — E860 Dehydration: Secondary | ICD-10-CM | POA: Diagnosis present

## 2022-03-29 DIAGNOSIS — E876 Hypokalemia: Secondary | ICD-10-CM | POA: Diagnosis not present

## 2022-03-29 DIAGNOSIS — R1031 Right lower quadrant pain: Secondary | ICD-10-CM | POA: Diagnosis not present

## 2022-03-29 DIAGNOSIS — N3000 Acute cystitis without hematuria: Secondary | ICD-10-CM

## 2022-03-29 DIAGNOSIS — Z6831 Body mass index (BMI) 31.0-31.9, adult: Secondary | ICD-10-CM | POA: Diagnosis not present

## 2022-03-29 DIAGNOSIS — E119 Type 2 diabetes mellitus without complications: Secondary | ICD-10-CM | POA: Diagnosis present

## 2022-03-29 DIAGNOSIS — F419 Anxiety disorder, unspecified: Secondary | ICD-10-CM | POA: Diagnosis not present

## 2022-03-29 DIAGNOSIS — S91339A Puncture wound without foreign body, unspecified foot, initial encounter: Secondary | ICD-10-CM | POA: Diagnosis present

## 2022-03-29 DIAGNOSIS — T8189XA Other complications of procedures, not elsewhere classified, initial encounter: Secondary | ICD-10-CM | POA: Diagnosis not present

## 2022-03-29 DIAGNOSIS — Z79899 Other long term (current) drug therapy: Secondary | ICD-10-CM

## 2022-03-29 DIAGNOSIS — Z794 Long term (current) use of insulin: Secondary | ICD-10-CM | POA: Diagnosis not present

## 2022-03-29 DIAGNOSIS — M7989 Other specified soft tissue disorders: Secondary | ICD-10-CM | POA: Diagnosis not present

## 2022-03-29 DIAGNOSIS — A401 Sepsis due to streptococcus, group B: Secondary | ICD-10-CM | POA: Diagnosis not present

## 2022-03-29 LAB — CBG MONITORING, ED
Glucose-Capillary: 365 mg/dL — ABNORMAL HIGH (ref 70–99)
Glucose-Capillary: 323 mg/dL — ABNORMAL HIGH (ref 70–99)
Glucose-Capillary: 265 mg/dL — ABNORMAL HIGH (ref 70–99)
Glucose-Capillary: 239 mg/dL — ABNORMAL HIGH (ref 70–99)
Glucose-Capillary: 217 mg/dL — ABNORMAL HIGH (ref 70–99)
Glucose-Capillary: 226 mg/dL — ABNORMAL HIGH (ref 70–99)

## 2022-03-29 LAB — COMPREHENSIVE METABOLIC PANEL
ALT: 16 U/L (ref 0–44)
AST: 16 U/L (ref 15–41)
Albumin: 4.3 g/dL (ref 3.5–5.0)
Alkaline Phosphatase: 76 U/L (ref 38–126)
Anion gap: 21 — ABNORMAL HIGH (ref 5–15)
BUN: 15 mg/dL (ref 6–20)
CO2: 15 mmol/L — ABNORMAL LOW (ref 22–32)
Calcium: 10.4 mg/dL — ABNORMAL HIGH (ref 8.9–10.3)
Chloride: 96 mmol/L — ABNORMAL LOW (ref 98–111)
Creatinine, Ser: 0.68 mg/dL (ref 0.44–1.00)
GFR, Estimated: >60 mL/min (ref >60)
Glucose, Bld: 361 mg/dL — ABNORMAL HIGH (ref 70–99)
Potassium: 4.5 mmol/L (ref 3.5–5.1)
Sodium: 132 mmol/L — ABNORMAL LOW (ref 135–145)
Total Bilirubin: 0.8 mg/dL (ref 0.3–1.2)
Total Protein: 7.5 g/dL (ref 6.5–8.1)

## 2022-03-29 LAB — BASIC METABOLIC PANEL
Anion gap: 10 (ref 5–15)
BUN: 10 mg/dL (ref 6–20)
CO2: 19 mmol/L — ABNORMAL LOW (ref 22–32)
Calcium: 9.2 mg/dL (ref 8.9–10.3)
Chloride: 106 mmol/L (ref 98–111)
Creatinine, Ser: 0.5 mg/dL (ref 0.44–1.00)
GFR, Estimated: >60 mL/min (ref >60)
Glucose, Bld: 215 mg/dL — ABNORMAL HIGH (ref 70–99)
Potassium: 2.9 mmol/L — ABNORMAL LOW (ref 3.5–5.1)
Sodium: 135 mmol/L (ref 135–145)

## 2022-03-29 LAB — CBC WITH DIFFERENTIAL/PLATELET
Abs Immature Granulocytes: 0.15 10*3/uL — ABNORMAL HIGH (ref 0.00–0.07)
Basophils Absolute: 0 10*3/uL (ref 0.0–0.1)
Basophils Relative: 0 %
Eosinophils Absolute: 0 10*3/uL (ref 0.0–0.5)
Eosinophils Relative: 0 %
HCT: 40.6 % (ref 36.0–46.0)
Hemoglobin: 14 g/dL (ref 12.0–15.0)
Immature Granulocytes: 1 %
Lymphocytes Relative: 8 %
Lymphs Abs: 1.6 10*3/uL (ref 0.7–4.0)
MCH: 30.8 pg (ref 26.0–34.0)
MCHC: 34.5 g/dL (ref 30.0–36.0)
MCV: 89.2 fL (ref 80.0–100.0)
Monocytes Absolute: 1.6 10*3/uL — ABNORMAL HIGH (ref 0.1–1.0)
Monocytes Relative: 8 %
Neutro Abs: 16.9 10*3/uL — ABNORMAL HIGH (ref 1.7–7.7)
Neutrophils Relative %: 83 %
Platelets: 244 10*3/uL (ref 150–400)
RBC: 4.55 MIL/uL (ref 3.87–5.11)
RDW: 12.3 % (ref 11.5–15.5)
WBC: 20.3 10*3/uL — ABNORMAL HIGH (ref 4.0–10.5)
nRBC: 0 % (ref 0.0–0.2)

## 2022-03-29 LAB — URINALYSIS, ROUTINE W REFLEX MICROSCOPIC
Bilirubin Urine: NEGATIVE
Glucose, UA: >1000 mg/dL — AB
Hgb urine dipstick: NEGATIVE
Ketones, ur: >80 mg/dL — AB
Nitrite: NEGATIVE
Protein, ur: 30 mg/dL — AB
Specific Gravity, Urine: 1.046 — ABNORMAL HIGH (ref 1.005–1.030)
pH: 5.5 (ref 5.0–8.0)

## 2022-03-29 LAB — I-STAT VENOUS BLOOD GAS, ED
Acid-base deficit: 9 mmol/L — ABNORMAL HIGH (ref 0.0–2.0)
Bicarbonate: 16.5 mmol/L — ABNORMAL LOW (ref 20.0–28.0)
Calcium, Ion: 1.29 mmol/L (ref 1.15–1.40)
HCT: 40 % (ref 36.0–46.0)
Hemoglobin: 13.6 g/dL (ref 12.0–15.0)
O2 Saturation: 38 %
Potassium: 5.3 mmol/L — ABNORMAL HIGH (ref 3.5–5.1)
Sodium: 131 mmol/L — ABNORMAL LOW (ref 135–145)
TCO2: 18 mmol/L — ABNORMAL LOW (ref 22–32)
pCO2, Ven: 34.1 mmHg — ABNORMAL LOW (ref 44–60)
pH, Ven: 7.293 (ref 7.25–7.43)
pO2, Ven: 25 mmHg — CL (ref 32–45)

## 2022-03-29 LAB — GLUCOSE, CAPILLARY
Glucose-Capillary: 204 mg/dL — ABNORMAL HIGH (ref 70–99)
Glucose-Capillary: 217 mg/dL — ABNORMAL HIGH (ref 70–99)
Glucose-Capillary: 214 mg/dL — ABNORMAL HIGH (ref 70–99)
Glucose-Capillary: 187 mg/dL — ABNORMAL HIGH (ref 70–99)
Glucose-Capillary: 189 mg/dL — ABNORMAL HIGH (ref 70–99)
Glucose-Capillary: 163 mg/dL — ABNORMAL HIGH (ref 70–99)
Glucose-Capillary: 162 mg/dL — ABNORMAL HIGH (ref 70–99)
Glucose-Capillary: 186 mg/dL — ABNORMAL HIGH (ref 70–99)
Glucose-Capillary: 207 mg/dL — ABNORMAL HIGH (ref 70–99)

## 2022-03-29 LAB — C-REACTIVE PROTEIN: CRP: 20.6 mg/dL — ABNORMAL HIGH (ref <1.0)

## 2022-03-29 LAB — PREALBUMIN: Prealbumin: 6 mg/dL — ABNORMAL LOW (ref 18–38)

## 2022-03-29 LAB — BETA-HYDROXYBUTYRIC ACID
Beta-Hydroxybutyric Acid: 5.54 mmol/L — ABNORMAL HIGH (ref 0.05–0.27)
Beta-Hydroxybutyric Acid: 0.62 mmol/L — ABNORMAL HIGH (ref 0.05–0.27)

## 2022-03-29 LAB — LIPASE, BLOOD: Lipase: 16 U/L (ref 11–51)

## 2022-03-29 LAB — HEMOGLOBIN A1C
Hgb A1c MFr Bld: 10.3 % — ABNORMAL HIGH (ref 4.8–5.6)
Mean Plasma Glucose: 248.91 mg/dL

## 2022-03-29 LAB — SEDIMENTATION RATE: Sed Rate: 80 mm/hr — ABNORMAL HIGH (ref 0–22)

## 2022-03-29 LAB — MRSA NEXT GEN BY PCR, NASAL: MRSA by PCR Next Gen: NOT DETECTED

## 2022-03-29 MED ORDER — VANCOMYCIN HCL 1500 MG/300ML IV SOLN
1500.0000 mg | Freq: Once | INTRAVENOUS | Status: AC
Start: 2022-03-29 — End: 2022-03-30
  Administered 2022-03-29: 1500 mg via INTRAVENOUS
  Filled 2022-03-29: qty 300

## 2022-03-29 MED ORDER — SODIUM CHLORIDE 0.9 % IV BOLUS
1000.0000 mL | Freq: Once | INTRAVENOUS | Status: AC
  Administered 2022-03-29: 1000 mL via INTRAVENOUS

## 2022-03-29 MED ORDER — PROCHLORPERAZINE EDISYLATE 10 MG/2ML IJ SOLN
5.0000 mg | Freq: Once | INTRAMUSCULAR | Status: AC
Start: 2022-03-29 — End: 2022-03-29
  Administered 2022-03-29: 5 mg via INTRAVENOUS
  Filled 2022-03-29: qty 2

## 2022-03-29 MED ORDER — LACTATED RINGERS IV BOLUS
1000.0000 mL | Freq: Once | INTRAVENOUS | Status: AC
  Administered 2022-03-29: 1000 mL via INTRAVENOUS

## 2022-03-29 MED ORDER — ONDANSETRON HCL 4 MG/2ML IJ SOLN
4.0000 mg | Freq: Four times a day (QID) | INTRAMUSCULAR | Status: DC | PRN
  Administered 2022-03-29 – 2022-04-03 (×8): 4 mg via INTRAVENOUS
  Filled 2022-03-29 (×8): qty 2

## 2022-03-29 MED ORDER — ACETAMINOPHEN 325 MG PO TABS
650.0000 mg | ORAL_TABLET | Freq: Four times a day (QID) | ORAL | Status: DC | PRN
  Administered 2022-03-29 – 2022-04-03 (×7): 650 mg via ORAL
  Filled 2022-03-29 (×8): qty 2

## 2022-03-29 MED ORDER — VANCOMYCIN HCL IN DEXTROSE 1-5 GM/200ML-% IV SOLN
1000.0000 mg | Freq: Two times a day (BID) | INTRAVENOUS | Status: DC
  Administered 2022-03-30 – 2022-03-31 (×3): 1000 mg via INTRAVENOUS
  Filled 2022-03-29 (×3): qty 200

## 2022-03-29 MED ORDER — SODIUM CHLORIDE 0.9 % IV SOLN
2.0000 g | Freq: Three times a day (TID) | INTRAVENOUS | Status: DC
  Administered 2022-03-29 – 2022-04-01 (×8): 2 g via INTRAVENOUS
  Filled 2022-03-29 (×8): qty 12.5

## 2022-03-29 MED ORDER — FENTANYL CITRATE PF 50 MCG/ML IJ SOSY
50.0000 ug | PREFILLED_SYRINGE | Freq: Once | INTRAMUSCULAR | Status: AC
  Administered 2022-03-29: 50 ug via INTRAVENOUS
  Filled 2022-03-29: qty 1

## 2022-03-29 MED ORDER — LIVING WELL WITH DIABETES BOOK
Freq: Once | Status: AC
  Filled 2022-03-29: qty 1

## 2022-03-29 MED ORDER — POTASSIUM CHLORIDE CRYS ER 20 MEQ PO TBCR
40.0000 meq | EXTENDED_RELEASE_TABLET | Freq: Once | ORAL | Status: AC
Start: 2022-03-29 — End: 2022-03-29
  Administered 2022-03-29: 40 meq via ORAL
  Filled 2022-03-29: qty 2

## 2022-03-29 MED ORDER — ONDANSETRON HCL 4 MG PO TABS: 4.0000 mg | ORAL_TABLET | Freq: Four times a day (QID) | ORAL | Status: DC | PRN

## 2022-03-29 MED ORDER — GADOBUTROL 1 MMOL/ML IV SOLN
8.0000 mL | Freq: Once | INTRAVENOUS | Status: AC | PRN
  Administered 2022-03-29: 8 mL via INTRAVENOUS

## 2022-03-29 MED ORDER — INSULIN STARTER KIT- PEN NEEDLES (ENGLISH)
1.0000 | Freq: Once | Status: AC
Start: 2022-03-29 — End: 2022-03-29
  Administered 2022-03-29: 1
  Filled 2022-03-29: qty 1

## 2022-03-29 MED ORDER — ZOLPIDEM TARTRATE 5 MG PO TABS
5.0000 mg | ORAL_TABLET | Freq: Every evening | ORAL | Status: DC | PRN
  Administered 2022-03-31 – 2022-04-04 (×5): 5 mg via ORAL
  Filled 2022-03-29 (×7): qty 1

## 2022-03-29 MED ORDER — ALUM & MAG HYDROXIDE-SIMETH 200-200-20 MG/5ML PO SUSP
30.0000 mL | ORAL | Status: DC | PRN
  Administered 2022-03-29 – 2022-04-02 (×6): 30 mL via ORAL
  Filled 2022-03-29 (×7): qty 30

## 2022-03-29 MED ORDER — LACTATED RINGERS IV BOLUS
20.0000 mL/kg | Freq: Once | INTRAVENOUS | Status: AC
  Administered 2022-03-29: 1624 mL via INTRAVENOUS

## 2022-03-29 MED ORDER — DEXTROSE 50 % IV SOLN: 0.0000 mL | INTRAVENOUS | Status: DC | PRN

## 2022-03-29 MED ORDER — ACETAMINOPHEN 650 MG RE SUPP: 650.0000 mg | Freq: Four times a day (QID) | RECTAL | Status: DC | PRN

## 2022-03-29 MED ORDER — SODIUM CHLORIDE 0.9 % IV SOLN
1.0000 g | Freq: Once | INTRAVENOUS | Status: AC
  Administered 2022-03-29: 1 g via INTRAVENOUS
  Filled 2022-03-29: qty 10

## 2022-03-29 MED ORDER — INSULIN REGULAR(HUMAN) IN NACL 100-0.9 UT/100ML-% IV SOLN
INTRAVENOUS | Status: DC
  Administered 2022-03-29: 2.8 [IU]/h via INTRAVENOUS
  Administered 2022-03-29: 12 [IU]/h via INTRAVENOUS
  Filled 2022-03-29 (×2): qty 100

## 2022-03-29 MED ORDER — LACTATED RINGERS IV SOLN: INTRAVENOUS | Status: DC

## 2022-03-29 MED ORDER — METRONIDAZOLE 500 MG/100ML IV SOLN
500.0000 mg | Freq: Two times a day (BID) | INTRAVENOUS | Status: DC
  Administered 2022-03-29 – 2022-04-01 (×6): 500 mg via INTRAVENOUS
  Filled 2022-03-29 (×6): qty 100

## 2022-03-29 MED ORDER — IOHEXOL 300 MG/ML  SOLN
100.0000 mL | Freq: Once | INTRAMUSCULAR | Status: AC | PRN
  Administered 2022-03-29: 100 mL via INTRAVENOUS

## 2022-03-29 MED ORDER — CHLORHEXIDINE GLUCONATE CLOTH 2 % EX PADS
6.0000 | MEDICATED_PAD | Freq: Every day | CUTANEOUS | Status: DC
  Administered 2022-03-29: 6 via TOPICAL

## 2022-03-29 MED ORDER — SODIUM CHLORIDE 0.9 % IV SOLN: 1.0000 g | INTRAVENOUS | Status: DC

## 2022-03-29 MED ORDER — ENOXAPARIN SODIUM 40 MG/0.4ML IJ SOSY
40.0000 mg | PREFILLED_SYRINGE | INTRAMUSCULAR | Status: DC
  Administered 2022-03-29 – 2022-04-04 (×7): 40 mg via SUBCUTANEOUS
  Filled 2022-03-29 (×7): qty 0.4

## 2022-03-29 MED ORDER — OXYCODONE HCL 5 MG PO TABS
5.0000 mg | ORAL_TABLET | ORAL | Status: DC | PRN
  Administered 2022-03-31 – 2022-04-05 (×12): 5 mg via ORAL
  Filled 2022-03-29 (×14): qty 1

## 2022-03-29 MED ORDER — DEXTROSE IN LACTATED RINGERS 5 % IV SOLN: INTRAVENOUS | Status: DC

## 2022-03-29 MED ORDER — ORAL CARE MOUTH RINSE: 15.0000 mL | OROMUCOSAL | Status: DC | PRN

## 2022-03-29 MED ORDER — ONDANSETRON HCL 4 MG/2ML IJ SOLN
4.0000 mg | Freq: Once | INTRAMUSCULAR | Status: AC
  Administered 2022-03-29: 4 mg via INTRAVENOUS
  Filled 2022-03-29: qty 2

## 2022-03-29 NOTE — ED Provider Notes (Signed)
Patient here with nausea and vomiting and abdominal pain.  Lab work ready suggestive of DKA.  Has a leukocytosis of 20 but no fever.  Urinalysis appears to be consistent with infection.  CT scan of the abdomen pelvis is unremarkable.  Overall suspect UTI causing DKA although she does have some noncompliance issues with her diabetes regimen as well.  Hemodynamically she stable.  Will admit for further care.  Patient on insulin infusion.  No concern for sepsis at this time.  No fever, unremarkable vitals.  This chart was dictated using voice recognition software.  Despite best efforts to proofread,  errors can occur which can change the documentation meaning.    Lennice Sites, DO 03/29/22 [phone]

## 2022-03-29 NOTE — TOC Benefit Eligibility Note (Signed)
Patient Teacher, English as a foreign language completed.    The patient is currently admitted and upon discharge could be taking Lantus Pens.  The current 30 day co-pay is $84.08.   The patient is currently admitted and upon discharge could be taking Toujeo Pens.  The current 30 day co-pay is $52.27.   The patient is currently admitted and upon discharge could be taking Levemir Pens.  Non Formulary  The patient is currently admitted and upon discharge could be taking Novolog Pens.  Non Formulary  The patient is currently admitted and upon discharge could be taking Humalog Pens.  Non Formulary   The patient is insured through [employer], [place] Patient [place] Patient Advocate Team Direct Number: [phone]  Fax: [phone]

## 2022-03-29 NOTE — ED Provider Notes (Signed)
[facility]  Provider Note  CSN: [id number] Arrival date & time: 03/29/22 0601  History Chief Complaint  Patient presents with   Abdominal Pain    Courtney Parks is a 61 y.o. female with history of poorly controlled DM reports 4-5 days of persistent diffuse abdominal pain, nausea and vomiting with poor PO intake and elevated sugars. She has not had a fever.    Home Medications Prior to Admission medications   Medication Sig Start Date End Date Taking? Authorizing Provider  diphenhydramine-acetaminophen (TYLENOL PM) 25-500 MG TABS tablet Take 2 tablets by mouth at bedtime as needed (PAIN).    [provider]  Dulaglutide (TRULICITY) 3.82 NK/5.3ZJ SOPN Inject 0.75 mg into the skin once a week. 67/34/19   Delora Fuel, MD  Multiple Vitamin (MULTIVITAMIN) capsule Take 1 capsule by mouth daily.    [provider]  OVER THE COUNTER MEDICATION Take 1 tablet by mouth daily. DR Hume    [provider]  potassium chloride SA (KLOR-CON M) 20 MEQ tablet Take 1 tablet (20 mEq total) by mouth 2 (two) times daily. 37/90/24   Delora Fuel, MD     Allergies    Codeine, Demerol, Other, and Shellfish allergy   Review of Systems   Review of Systems Please see HPI for pertinent positives and negatives  Physical Exam BP (!) 139/105   Pulse (!) 107   Temp 98 F (36.7 C)   Resp (!) 21   Ht '5\' 7"'$  (1.702 m)   Wt 81.2 kg   SpO2 100%   BMI 28.04 kg/m   Physical Exam Vitals and nursing note reviewed.  Constitutional:      Appearance: Normal appearance.  HENT:     Head: Normocephalic and atraumatic.     Nose: Nose normal.     Mouth/Throat:     Mouth: Mucous membranes are dry.  Eyes:     Extraocular Movements: Extraocular movements intact.     Conjunctiva/sclera: Conjunctivae normal.  Cardiovascular:     Rate and Rhythm: Tachycardia present.  Pulmonary:     Effort: Pulmonary effort is normal.      Breath sounds: Normal breath sounds.  Abdominal:     General: Abdomen is flat.     Palpations: Abdomen is soft.     Tenderness: There is abdominal tenderness in the right lower quadrant. There is no guarding. Negative signs include Murphy's sign and McBurney's sign.  Musculoskeletal:        General: No swelling. Normal range of motion.     Cervical back: Neck supple.  Skin:    General: Skin is warm and dry.  Neurological:     General: No focal deficit present.     Mental Status: She is alert.  Psychiatric:        Mood and Affect: Mood normal.     ED Results / Procedures / Treatments   EKG None  Procedures Procedures  Medications Ordered in the ED Medications  lactated ringers bolus 1,624 mL (has no administration in time range)  insulin regular, human (MYXREDLIN) 100 units/ 100 mL infusion (has no administration in time range)  lactated ringers infusion (has no administration in time range)  dextrose 5 % in lactated ringers infusion (has no administration in time range)  dextrose 50 % solution 0-50 mL (has no administration in time range)  iohexol (OMNIPAQUE) 300 MG/ML solution 100 mL (has no administration in time range)  sodium chloride 0.9 % bolus 1,000 mL (  1,000 mLs Intravenous New Bag/Given 03/29/22 0654)  fentaNYL (SUBLIMAZE) injection 50 mcg (50 mcg Intravenous Given 03/29/22 0654)  ondansetron (ZOFRAN) injection 4 mg (4 mg Intravenous Given 03/29/22 0654)    Initial Impression and Plan  Patient here with elevated glucose, abdominal pain and vomiting. Exam is concerning for RLQ tenderness. She may have an infectious process causing elevated glucose or she may be in DKA from poorly controlled DM. Will check labs, begin IVF, send for CT. Pain and nausea meds for comfort  ED Course   Clinical Course as of 03/29/22 0707  Fri Mar 29, 2022  0640 CBC shows elevated WBC with left shift.  [CS]  0658 VBG with mild acidosis. CBC with anion gap. Will begin Endotool. Care of the  patient will be signed out to the oncoming team at the change of shift.   [CS]    Clinical Course User Index [CS] Truddie Hidden, MD     MDM Rules/Calculators/A&P Medical Decision Making Problems Addressed: Diabetic ketoacidosis without coma associated with type 1 diabetes mellitus [facility]): acute illness or injury  Amount and/or Complexity of Data Reviewed Labs: ordered. Decision-making details documented in ED Course. Radiology: ordered.  Risk Prescription drug management. Decision regarding hospitalization.  Critical Care Total time providing critical care: 40 minutes    Final Clinical Impression(s) / ED Diagnoses Final diagnoses:  Diabetic ketoacidosis without coma associated with type 1 diabetes mellitus [facility])    Rx / DC Orders ED Discharge Orders     None        Truddie Hidden, MD 03/29/22 [phone]

## 2022-03-29 NOTE — Progress Notes (Signed)
Pharmacy Antibiotic Note  Courtney Parks is a 61 y.o. female admitted on 03/29/2022 with  diabetic food infection .  Pharmacy has been consulted for Vanco, Cefepime dosing.  Active Problem(s): abdominal pain, nausea and vomiting in the setting of DKA and urinary tract infection  PMH: anxiety, depression, hypertension, hyperlipidemia, DM  ID: DFI. Afebrile. WBC 20.3.  Vanco 7/28>> Cefepime 7/28>> Flagyl 7/28>>   Plan: Cefepime 2g IV q8hr Vancomycin 1000 mg IV Q 12 hrs. Goal AUC 400-550. Expected AUC: 528 SCr used: 0.8    Height: '5\' 7"'$  (170.2 cm) Weight: 81.2 kg (179 lb) IBW/kg (Calculated) : 61.6  Temp (24hrs), Avg:98.4 F (36.9 C), Min:98 F (36.7 C), Max:98.8 F (37.1 C)  Recent Labs  Lab 03/29/22 0626 03/29/22 1408  WBC 20.3*  --   CREATININE 0.68 0.50    Estimated Creatinine Clearance: 81.9 mL/min (by C-G formula based on SCr of 0.5 mg/dL).    Allergies  Allergen Reactions   Codeine     REACTION: Nausea   Demerol Nausea And Vomiting   Other Other (See Comments)    Narcotics- drops her blood pressure   Shellfish Allergy Swelling    Rhyder Koegel S. Alford [place]land, PharmD, BCPS Clinical Staff Pharmacist [email or web address]  Eilene Ghazi Stillinger 03/29/2022 6:01 PM

## 2022-03-29 NOTE — ED Notes (Signed)
RT obtained VBG on pt with the following results and criticals. MD notified of critical values.    Latest Reference Range & Units 03/29/22 06:56  Sample type  VENOUS  pH, Ven 7.25 - 7.43  7.293  pCO2, Ven 44 - 60 mmHg 34.1 (L)  pO2, Ven 32 - 45 mmHg 25 (LL)  TCO2 22 - 32 mmol/L 18 (L)  Acid-base deficit 0.0 - 2.0 mmol/L 9.0 (H)  Bicarbonate 20.0 - 28.0 mmol/L 16.5 (L)  O2 Saturation % 38  Collection site  IV start

## 2022-03-29 NOTE — ED Notes (Signed)
Pt called out complaining of worsening back/abd pain. MD made aware.

## 2022-03-29 NOTE — H&P (Signed)
History and Physical    Patient: Courtney Parks KGY:[id number] DOB: 12/18/1960 DOA: 03/29/2022 DOS: the patient was seen and examined on 03/29/2022 PCP: Pcp, No  Patient coming from: Home  Chief Complaint:  Chief Complaint  Patient presents with   Abdominal Pain   HPI: Courtney Parks is a 61 y.o. female with medical history significant of anxiety, depression, type 2 diabetes, hyperlipidemia, hypertension, nephrolithiasis who has been admitted to the hospital after presenting to the emergency department due to abdominal pain in her right lower quadrant, right CVA pain and dysuria for the past week.  However, the patient stated that she has been very tired for the past 3 days.  She struggled to finish work on Tuesday due to fatigue.  She had to call in Wednesday.  Her CBG measurements have been high in the 300s.  She used her Trulicity yesterday evening.  However, she felt worse.  She had 2 episodes of emesis.  She went to the emergency department.  No hematuria.  No fever, but she has had some chills, night sweats, malaise and decreased appetite. No sore throat, rhinorrhea, dyspnea, wheezing or hemoptysis.  No chest pain, palpitations, diaphoresis, PND, orthopnea or pitting edema of the lower extremities.  No appetite changes, abdominal pain, diarrhea, constipation, melena or hematochezia.  No polyuria, polydipsia, polyphagia or blurred vision.  After arriving to this facility, the nursing staff noticed that the patient had a plantar wound.  She denied any specific trauma to the area.  ED course: Initial vital signs were temperature 98 F, pulse 112, respiration 20, BP 177/70 mmHg O2 sat 100% on room air.  She received ceftriaxone 1 g IVPB, ondansetron 4 mg IVP, fentanyl 50 mcg IVP twice LR 1624 mL bolus and normal saline 1000 mL bolus.  She was started on an insulin infusion.  Lab work: Urinalysis showed increase of specific gravity of 1.046, glucosuria more than 1000, ketonuria  more than 80 and proteinuria 30 mg/dL.  Leukocyte esterase was moderate.  There was 11-20 WBC per hpf and many bacteria on microscopic examination.  CBC is her white count 20.3, hemoglobin 14.0 g/dL and platelets 244.  Venous blood gas showed a pH of 7.293, PCO2 of 34.1 and PO2 of 25 mmHg.  Bicarbonate was 16.5 and T CO2 18 mmol/L.  Lipase was normal.  CMP showed normal liver and renal function.  CO2 was 15 mmol/L with an anion gap of 21.  Glucose 361 and calcium 10.4 mg/dL.  Sodium and chloride normalized when corrected to glucose.  Imaging: CT abdomen and pelvis with contrast did not show any acute abdominal pelvic abnormality.  There was nonobstructive left-sided nephrolithiasis.  There was mild hepatic asteatosis.  Please see images and full radiology report for further details.  Review of Systems: As mentioned in the history of present illness. All other systems reviewed and are negative.  Past Medical History:  Diagnosis Date   Anxiety    Depression    Diabetes mellitus, type II ([place])    Hyperlipidemia    Hypertension    Kidney stones    Past Surgical History:  Procedure Laterality Date   CHOLECYSTECTOMY  02/28/2012   Procedure: LAPAROSCOPIC CHOLECYSTECTOMY WITH INTRAOPERATIVE CHOLANGIOGRAM;  Surgeon: Imogene Burn. Georgette Dover, MD;  Location: [facility];  Service: General;  Laterality: N/A;   ULNAR NERVE REPAIR     URETERAL STENT PLACEMENT     Social History:  reports that she quit smoking about 38 years ago. Her smoking use included cigarettes. She  has never used smokeless tobacco. She reports that she does not drink alcohol and does not use drugs.  Allergies  Allergen Reactions   Codeine     REACTION: Nausea   Demerol Nausea And Vomiting   Other Other (See Comments)    Narcotics- drops her blood pressure   Shellfish Allergy Swelling    Family History  Problem Relation Age of Onset   Diabetes Mother    Hypertension Mother    Colon cancer Mother    Hypertension Father    ADD / ADHD  Maternal Grandmother     Prior to Admission medications   Medication Sig Start Date End Date Taking? Authorizing Provider  diphenhydramine-acetaminophen (TYLENOL PM) 25-500 MG TABS tablet Take 2 tablets by mouth at bedtime as needed (PAIN).    [provider]  Dulaglutide (TRULICITY) 9.62 EZ/6.6QH SOPN Inject 0.75 mg into the skin once a week. 47/65/46   Delora Fuel, MD  Multiple Vitamin (MULTIVITAMIN) capsule Take 1 capsule by mouth daily.    [provider]  OVER THE COUNTER MEDICATION Take 1 tablet by mouth daily. DR [place]    [provider]  potassium chloride SA (KLOR-CON M) 20 MEQ tablet Take 1 tablet (20 mEq total) by mouth 2 (two) times daily. 50/35/46   Delora Fuel, MD    Physical Exam: Vitals:   03/29/22 1018 03/29/22 1100 03/29/22 1200 03/29/22 1345  BP: (!) 150/70 (!) 86/53 (!) 129/96 (!) 154/51  Pulse: (!) 102 (!) 109 88 (!) 102  Resp: 14 14 (!) 21 16  Temp:      TempSrc:      SpO2: 100% 100% (!) 88% 99%  Weight:      Height:       Physical Exam Vitals reviewed.  Constitutional:      General: She is awake.     Appearance: She is overweight. She is ill-appearing. She is not toxic-appearing.  HENT:     Head: Normocephalic.     Mouth/Throat:     Mouth: Mucous membranes are dry.  Eyes:     General: No scleral icterus.    Pupils: Pupils are equal, round, and reactive to light.  Neck:     Vascular: No JVD.  Cardiovascular:     Rate and Rhythm: Normal rate and regular rhythm.     Heart sounds: S1 normal and S2 normal.  Pulmonary:     Effort: Pulmonary effort is normal.     Breath sounds: Normal breath sounds. No wheezing, rhonchi or rales.  Abdominal:     General: Bowel sounds are normal. There is no distension.     Palpations: Abdomen is soft.     Tenderness: There is abdominal tenderness in the right lower quadrant. There is right CVA tenderness. There is no left CVA tenderness, guarding or rebound.   Musculoskeletal:     Cervical back: Neck supple.     Right lower leg: No edema.     Left lower leg: No edema.     Right foot: Swelling and tenderness present.     Comments: Plantar area edema, erythema, calor and TTP.  Skin:    General: Skin is warm and dry.     Coloration: Skin is not jaundiced.     Findings: Erythema present.     Comments: Plantar cellulitis with pus collection.  Please see pictures below.  Neurological:     General: No focal deficit present.     Mental Status: She is alert and oriented  to person, place, and time.  Psychiatric:        Mood and Affect: Mood normal.        Behavior: Behavior normal. Behavior is cooperative.          Data Reviewed:  There are no new results to review at this time.  Assessment and Plan: Principal Problem:   DKA (diabetic ketoacidosis) ([place]) Observation/stepdown. Keep NPO. Continue IV fluids. Continue insulin infusion. Monitor CBG closely. BMP every 4 hours. BHA every 8 hours. Consult diabetes coordinator.  Active Problems:   Puncture wound of plantar aspect of foot Continue IV fluids. Continue cefepime 2 g every 8 hours.   Continue metronidazole 500 mg IVPB q 12 hr. Continue vancomycin per pharmacy. Follow-up blood culture and sensitivity Follow CBC and CMP in a.m. Check lower extremities arterial Doppler. Message sent to podiatry on-call to consult.    Hyperlipidemia May benefit from statin therapy.    Hypertension Pain control. Consider starting ACE inhibitor if BP elevated.    Acute UTI (urinary tract infection)   History of kidney stones Continue ceftriaxone. Follow-up urine culture and sensitivity.    Anxiety and depression Ambien at bedtime while in the hospital. Follow-up with [facility] and/or PCP.    Hypercalcemia Recheck calcium and albumin in AM.     Advance Care Planning:   Code Status: Full Code   Consults: Podiatry consult requested.  Family Communication:  Severity of  Illness: The appropriate patient status for this patient is OBSERVATION. Observation status is judged to be reasonable and necessary in order to provide the required intensity of service to ensure the patient's safety. The patient's presenting symptoms, physical exam findings, and initial radiographic and laboratory data in the context of their medical condition is felt to place them at decreased risk for further clinical deterioration. Furthermore, it is anticipated that the patient will be medically stable for discharge from the hospital within 2 midnights of admission.   Author: Reubin Milan, MD 03/29/2022 3:56 PM  For on call review [email or web address].   This document was prepared using Dragon voice recognition software and may contain some unintended transcription errors.

## 2022-03-29 NOTE — Consult Note (Addendum)
Reason for Consult:right foot abscess Referring Physician: Dr Margaree Mackintosh is an 61 y.o. female.  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 Foot has been painful  Past Medical History:  Diagnosis Date   Anxiety    Depression    Diabetes mellitus, type II ([place])    Hyperlipidemia    Hypertension    Kidney stones     Past Surgical History:  Procedure Laterality Date   CHOLECYSTECTOMY  02/28/2012   Procedure: LAPAROSCOPIC CHOLECYSTECTOMY WITH INTRAOPERATIVE CHOLANGIOGRAM;  Surgeon: Imogene Burn. Georgette Dover, MD;  Location: [facility];  Service: General;  Laterality: N/A;   ULNAR NERVE REPAIR     URETERAL STENT PLACEMENT      Family History  Problem Relation Age of Onset   Diabetes Mother    Hypertension Mother    Colon cancer Mother    Hypertension Father    ADD / ADHD Maternal Grandmother     Social History:  reports that she quit smoking about 38 years ago. Her smoking use included cigarettes. She has never used smokeless tobacco. She reports that she does not drink alcohol and does not use drugs.  Allergies:  Allergies  Allergen Reactions   Codeine     REACTION: Nausea   Demerol Nausea And Vomiting   Other Other (See Comments)    Narcotics- drops her blood pressure   Shellfish Allergy Swelling    Medications: I have reviewed the patient's current medications.  Results for orders placed or performed during the hospital encounter of 03/29/22 (from the past 48 hour(s))  Comprehensive metabolic panel     Status: Abnormal   Collection Time: 03/29/22  6:26 AM  Result Value Ref Range   Sodium 132 (L) 135 - 145 mmol/L   Potassium 4.5 3.5 - 5.1 mmol/L   Chloride 96 (L) 98 - 111 mmol/L   CO2 15 (L) 22 - 32 mmol/L   Glucose, Bld 361 (H) 70 - 99 mg/dL    Comment: Glucose reference range applies only to samples  taken after fasting for at least 8 hours.   BUN 15 6 - 20 mg/dL   Creatinine, Ser 0.68 0.44 - 1.00 mg/dL   Calcium 10.4 (H) 8.9 - 10.3 mg/dL   Total Protein 7.5 6.5 - 8.1 g/dL   Albumin 4.3 3.5 - 5.0 g/dL   AST 16 15 - 41 U/L   ALT 16 0 - 44 U/L   Alkaline Phosphatase 76 38 - 126 U/L   Total Bilirubin 0.8 0.3 - 1.2 mg/dL   GFR, Estimated >60 >60 mL/min    Comment: (NOTE) Calculated using the CKD-EPI Creatinine Equation (2021)    Anion gap 21 (H) 5 - 15    Comment: Electrolytes repeated to confirm. Performed at [facility], [address] [place], [place] [address]   Lipase, blood     Status: None   Collection Time: 03/29/22  6:26 AM  Result Value Ref Range   Lipase 16 11 - 51 U/L    Comment: Performed at [facility], [address] [place], [place] [address]  CBC with Differential     Status: Abnormal   Collection Time: 03/29/22  6:26 AM  Result Value Ref Range   WBC 20.3 (H) 4.0 - 10.5 K/uL   RBC 4.55 3.87 - 5.11 MIL/uL  Hemoglobin 14.0 12.0 - 15.0 g/dL   HCT 40.6 36.0 - 46.0 %   MCV 89.2 80.0 - 100.0 fL   MCH 30.8 26.0 - 34.0 pg   MCHC 34.5 30.0 - 36.0 g/dL   RDW 12.3 11.5 - 15.5 %   Platelets 244 150 - 400 K/uL   nRBC 0.0 0.0 - 0.2 %   Neutrophils Relative % 83 %   Neutro Abs 16.9 (H) 1.7 - 7.7 K/uL   Lymphocytes Relative 8 %   Lymphs Abs 1.6 0.7 - 4.0 K/uL   Monocytes Relative 8 %   Monocytes Absolute 1.6 (H) 0.1 - 1.0 K/uL   Eosinophils Relative 0 %   Eosinophils Absolute 0.0 0.0 - 0.5 K/uL   Basophils Relative 0 %   Basophils Absolute 0.0 0.0 - 0.1 K/uL   Immature Granulocytes 1 %   Abs Immature Granulocytes 0.15 (H) 0.00 - 0.07 K/uL    Comment: Performed at [facility], [place], [place] [address]  Beta-hydroxybutyric acid     Status: Abnormal   Collection Time: 03/29/22  6:26 AM  Result Value Ref Range   Beta-Hydroxybutyric Acid 5.54 (H) 0.05 - 0.27 mmol/L    Comment: RESULT  CONFIRMED BY MANUAL DILUTION Performed at [facility], [address], [place], [place] [address]   CBG monitoring, ED     Status: Abnormal   Collection Time: 03/29/22  6:29 AM  Result Value Ref Range   Glucose-Capillary 365 (H) 70 - 99 mg/dL    Comment: Glucose reference range applies only to samples taken after fasting for at least 8 hours.   Comment 1 QC Due   I-Stat venous blood gas, ED     Status: Abnormal   Collection Time: 03/29/22  6:56 AM  Result Value Ref Range   pH, Ven 7.293 7.25 - 7.43   pCO2, Ven 34.1 (L) 44 - 60 mmHg   pO2, Ven 25 (LL) 32 - 45 mmHg   Bicarbonate 16.5 (L) 20.0 - 28.0 mmol/L   TCO2 18 (L) 22 - 32 mmol/L   O2 Saturation 38 %   Acid-base deficit 9.0 (H) 0.0 - 2.0 mmol/L   Sodium 131 (L) 135 - 145 mmol/L   Potassium 5.3 (H) 3.5 - 5.1 mmol/L   Calcium, Ion 1.29 1.15 - 1.40 mmol/L   HCT 40.0 36.0 - 46.0 %   Hemoglobin 13.6 12.0 - 15.0 g/dL   Collection site IV start    Drawn by RT    Sample type VENOUS    Comment NOTIFIED PHYSICIAN   CBG monitoring, ED     Status: Abnormal   Collection Time: 03/29/22  7:08 AM  Result Value Ref Range   Glucose-Capillary 323 (H) 70 - 99 mg/dL    Comment: Glucose reference range applies only to samples taken after fasting for at least 8 hours.  Urinalysis, Routine w reflex microscopic Urine, Clean Catch     Status: Abnormal   Collection Time: 03/29/22  8:05 AM  Result Value Ref Range   Color, Urine YELLOW YELLOW   APPearance CLEAR CLEAR   Specific Gravity, Urine 1.046 (H) 1.005 - 1.030   pH 5.5 5.0 - 8.0   Glucose, UA >1,000 (A) NEGATIVE mg/dL   Hgb urine dipstick NEGATIVE NEGATIVE   Bilirubin Urine NEGATIVE NEGATIVE   Ketones, ur >80 (A) NEGATIVE mg/dL   Protein, ur 30 (A) NEGATIVE mg/dL   Nitrite NEGATIVE NEGATIVE   Leukocytes,Ua MODERATE (A) NEGATIVE   RBC /  HPF 0-5 0 - 5 RBC/hpf   WBC, UA 11-20 0 - 5 WBC/hpf   Bacteria, UA MANY (A) NONE SEEN   Squamous Epithelial / LPF 0-5 0 - 5   Mucus PRESENT      Comment: Performed at [facility], [address] [place], [place] [address]  CBG monitoring, ED     Status: Abnormal   Collection Time: 03/29/22  8:39 AM  Result Value Ref Range   Glucose-Capillary 265 (H) 70 - 99 mg/dL    Comment: Glucose reference range applies only to samples taken after fasting for at least 8 hours.  CBG monitoring, ED     Status: Abnormal   Collection Time: 03/29/22  9:55 AM  Result Value Ref Range   Glucose-Capillary 239 (H) 70 - 99 mg/dL    Comment: Glucose reference range applies only to samples taken after fasting for at least 8 hours.  CBG monitoring, ED     Status: Abnormal   Collection Time: 03/29/22 11:24 AM  Result Value Ref Range   Glucose-Capillary 217 (H) 70 - 99 mg/dL    Comment: Glucose reference range applies only to samples taken after fasting for at least 8 hours.  CBG monitoring, ED     Status: Abnormal   Collection Time: 03/29/22 12:55 PM  Result Value Ref Range   Glucose-Capillary 226 (H) 70 - 99 mg/dL    Comment: Glucose reference range applies only to samples taken after fasting for at least 8 hours.  Glucose, capillary     Status: Abnormal   Collection Time: 03/29/22  1:46 PM  Result Value Ref Range   Glucose-Capillary 204 (H) 70 - 99 mg/dL    Comment: Glucose reference range applies only to samples taken after fasting for at least 8 hours.  Beta-hydroxybutyric acid     Status: Abnormal   Collection Time: 03/29/22  2:03 PM  Result Value Ref Range   Beta-Hydroxybutyric Acid 0.62 (H) 0.05 - 0.27 mmol/L    Comment: Performed at [facility], [place] [address] [place], [place] [address]  Hemoglobin A1c     Status: Abnormal   Collection Time: 03/29/22  2:03 PM  Result Value Ref Range   Hgb A1c MFr Bld 10.3 (H) 4.8 - 5.6 %    Comment: (NOTE) Pre diabetes:          5.7%-6.4%  Diabetes:              >6.4%  Glycemic control for   <7.0% adults with diabetes    Mean Plasma Glucose 248.91 mg/dL     Comment: Performed at [facility] [address] [place], [place] [address]  MRSA Next Gen by PCR, Nasal     Status: None   Collection Time: 03/29/22  2:04 PM   Specimen: Nasal Mucosa; Nasal Swab  Result Value Ref Range   MRSA by PCR Next Gen NOT DETECTED NOT DETECTED    Comment: (NOTE) The GeneXpert MRSA Assay (FDA approved for NASAL specimens only), is one component of a comprehensive MRSA colonization surveillance program. It is not intended to diagnose MRSA infection nor to guide or monitor treatment for MRSA infections. Test performance is not FDA approved in patients less than 40 years old. Performed at [facility], [place] [address] [place], [place] [address]   Basic metabolic panel     Status: Abnormal   Collection Time: 03/29/22  2:08 PM  Result Value Ref Range   Sodium 135 135 -  145 mmol/L   Potassium 2.9 (L) 3.5 - 5.1 mmol/L   Chloride 106 98 - 111 mmol/L   CO2 19 (L) 22 - 32 mmol/L   Glucose, Bld 215 (H) 70 - 99 mg/dL    Comment: Glucose reference range applies only to samples taken after fasting for at least 8 hours.   BUN 10 6 - 20 mg/dL   Creatinine, Ser 0.50 0.44 - 1.00 mg/dL   Calcium 9.2 8.9 - 10.3 mg/dL   GFR, Estimated >60 >60 mL/min    Comment: (NOTE) Calculated using the CKD-EPI Creatinine Equation (2021)    Anion gap 10 5 - 15    Comment: Performed at [facility], [place] [address], [place], [place] [address]  Glucose, capillary     Status: Abnormal   Collection Time: 03/29/22  2:45 PM  Result Value Ref Range   Glucose-Capillary 217 (H) 70 - 99 mg/dL    Comment: Glucose reference range applies only to samples taken after fasting for at least 8 hours.  Glucose, capillary     Status: Abnormal   Collection Time: 03/29/22  3:46 PM  Result Value Ref Range   Glucose-Capillary 214 (H) 70 - 99 mg/dL    Comment: Glucose reference range applies only to samples taken after fasting for at least 8 hours.  Glucose,  capillary     Status: Abnormal   Collection Time: 03/29/22  4:45 PM  Result Value Ref Range   Glucose-Capillary 187 (H) 70 - 99 mg/dL    Comment: Glucose reference range applies only to samples taken after fasting for at least 8 hours.  Sedimentation rate     Status: Abnormal   Collection Time: 03/29/22  5:18 PM  Result Value Ref Range   Sed Rate 80 (H) 0 - 22 mm/hr    Comment: Performed at [facility], [place] [address], [place], [place] [address]  C-reactive protein     Status: Abnormal   Collection Time: 03/29/22  5:18 PM  Result Value Ref Range   CRP 20.6 (H) <1.0 mg/dL    Comment: Performed at [facility] [address], [place], [place] [address]  Prealbumin     Status: Abnormal   Collection Time: 03/29/22  5:18 PM  Result Value Ref Range   Prealbumin 6 (L) 18 - 38 mg/dL    Comment: Performed at [facility] [address] [place], [place] [address]  Glucose, capillary     Status: Abnormal   Collection Time: 03/29/22  6:16 PM  Result Value Ref Range   Glucose-Capillary 189 (H) 70 - 99 mg/dL    Comment: Glucose reference range applies only to samples taken after fasting for at least 8 hours.  Glucose, capillary     Status: Abnormal   Collection Time: 03/29/22  7:18 PM  Result Value Ref Range   Glucose-Capillary 163 (H) 70 - 99 mg/dL    Comment: Glucose reference range applies only to samples taken after fasting for at least 8 hours.  Glucose, capillary     Status: Abnormal   Collection Time: 03/29/22  8:43 PM  Result Value Ref Range   Glucose-Capillary 162 (H) 70 - 99 mg/dL    Comment: Glucose reference range applies only to samples taken after fasting for at least 8 hours.    DG Foot 2 Views Right  Result Date: 03/29/2022 CLINICAL DATA:  Infected puncture wound in the plantar right foot. EXAM: RIGHT FOOT - 2 VIEW COMPARISON:  Right foot radiograph dated 01/12/2009. FINDINGS: There is no acute fracture or dislocation. The bones are mildly  osteopenic. No bone erosion or periosteal elevation. There is soft tissue swelling of the forefoot. Subcutaneous edema or tiny pocket of air in the soft tissues of the plantar forefoot. No radiopaque foreign object. IMPRESSION: 1. No acute fracture or dislocation. No evidence of acute osteomyelitis by radiograph. 2. Soft tissue swelling of the forefoot and possible tiny pockets of gas in the soft tissues of the plantar forefoot. No radiopaque foreign object. Electronically Signed   By: Anner Crete M.D.   On: 03/29/2022 19:35   CT Abdomen Pelvis W Contrast  Result Date: 03/29/2022 CLINICAL DATA:  RLQ abdominal pain (Age >= 14y) EXAM: CT ABDOMEN AND PELVIS WITH CONTRAST TECHNIQUE: Multidetector CT imaging of the abdomen and pelvis was performed using the standard protocol following bolus administration of intravenous contrast. RADIATION DOSE REDUCTION: This exam was performed according to the departmental dose-optimization program which includes automated exposure control, adjustment of the mA and/or kV according to patient size and/or use of iterative reconstruction technique. CONTRAST:  113m OMNIPAQUE IOHEXOL 300 MG/ML  SOLN COMPARISON:  CT abdomen pelvis 07/10/2021 FINDINGS: Lower chest: No acute abnormality. Hepatobiliary: Mildly lower liver density, in part due to contrast timing but may suggest a degree of hepatic steatosis. Multiple tiny liver hypodensities, statistically likely to be small cysts or hemangiomas. The gallbladder is surgically absent. Pancreas: Unremarkable. No pancreatic ductal dilatation or surrounding inflammatory changes. Spleen: Normal in size without focal abnormality. Small adjacent splenules. Adrenals/Urinary Tract: Adrenal glands are unremarkable. Multiple small bilateral renal cysts, some of which are simple and several too small to characterize. These are seen on multiple prior exams and no follow-up imaging is recommended. There are nonobstructive 2 mm stones in the left  upper and lower poles. No hydronephrosis or ureterolithiasis. Stomach/Bowel: The stomach is within normal limits. There is no evidence of bowel obstruction.The appendix is normal. Scattered colonic diverticula. No diverticulitis. Vascular/Lymphatic: Aortoiliac atherosclerosis. No AAA. No lymphadenopathy. Reproductive: Smalls subserosal or pedunculated calcified uterine fibroid posteriorly. No adnexal mass. Other: Small fat containing left inguinal hernia. No bowel containing hernia. No ascites. No free air. Punctate subcutaneous free gas in the left lower abdomen likely related to medication injection. Musculoskeletal: Unchanged anterior L5 compression deformity with 25% height loss. No acute osseous abnormality. Mild degenerative changes of the spine with grade 1 anterolisthesis at L4-L5. Mild bilateral hip osteoarthritis. No significant retropulsion. IMPRESSION: No acute abdominopelvic abnormality.  Normal appendix. Nonobstructive left-sided nephrolithiasis. Mild hepatic steatosis. Additional chronic findings as described above. Electronically Signed   By: JMaurine SimmeringM.D.   On: 03/29/2022 08:23    Review of Systems  Constitutional:  Positive for chills, fever and malaise/fatigue.  Skin:        Ulcer right foot  All other systems reviewed and are negative.  Blood pressure (!) 144/48, pulse 100, temperature 98.4 F (36.9 C), temperature source Oral, resp. rate 14, height '5\' 7"'$  (1.702 m), weight 81.2 kg, SpO2 100 %.  Vitals:   03/29/22 1605 03/29/22 1900  BP: (!) 144/48   Pulse: 100   Resp: 14   Temp:  98.4 F (36.9 C)  SpO2: 100%     General AA&O x3. Normal mood and affect.  Vascular Dorsalis pedis and posterior tibial pulses  present 2+ right  Capillary refill normal to all digits. Pedal hair growth normal.  Neurologic Epicritic sensation grossly present.  Dermatologic (Wound) Wound Location: Rt. foot plantar midfoot Wound  Measurement: small puncture wound with abscess subcutaneous   Orthopedic: Motor intact BLE.    Assessment/Plan:  Right foot abscess -Imaging: Studies independently reviewed -Antibiotics: continue broad spectrum for diabetic foot infection -WB Status: WBAT for now -MRI ordered -Surgical Plan: plan for I&D tomorrow -MRI is orderered  [name] 03/29/2022, 9:58 PM   Best available via secure chat for questions or concerns.

## 2022-03-29 NOTE — Inpatient Diabetes Management (Signed)
Inpatient Diabetes Program Recommendations  AACE/ADA: New Consensus Statement on Inpatient Glycemic Control (2015)  Target Ranges:  Prepandial:   less than 140 mg/dL      Peak postprandial:   less than 180 mg/dL (1-2 hours)      Critically ill patients:  140 - 180 mg/dL    Latest Reference Range & Units 08/12/21 04:00  Hemoglobin A1C 4.8 - 5.6 % 12.6 (H)  314 mg/dl  (H): Data is abnormally high  Latest Reference Range & Units 03/29/22 06:26  Sodium 135 - 145 mmol/L 132 (L)  Potassium 3.5 - 5.1 mmol/L 4.5  Chloride 98 - 111 mmol/L 96 (L)  CO2 22 - 32 mmol/L 15 (L)  Glucose 70 - 99 mg/dL 361 (H)  BUN 6 - 20 mg/dL 15  Creatinine 0.44 - 1.00 mg/dL 0.68  Calcium 8.9 - 10.3 mg/dL 10.4 (H)  Anion gap 5 - 15  21 (H)    Latest Reference Range & Units 03/29/22 06:29 03/29/22 07:08 03/29/22 08:39 03/29/22 09:55  Glucose-Capillary 70 - 99 mg/dL 365 (H) 323 (H)  IV Insulin Drip Started 265 (H) 239 (H)  (H): Data is abnormally high   Admit with: DKA/ UTI  History: DM  Home DM Meds: Trulicity 2.33 mg Qweek  Current Orders: IV Insulin Drip   No PCP listed  Note that Diabetes RN talked with pt by phone earlier this AM (see note for conversation details).  Pt has interest in starting Freestyle Libre CGM to help with home glucose monitoring.  MD gave order to start Freestyle Libre CGM today.  Reviewed Freestyle Libre 2 CGM system (how to use, how to place new sensor, sensor life, troubleshooting, warm-up time, how to use app on phone, rotation of insertion sites, etc).  Assisted pt to download the Freestyle app to her Phone.  Assisted pt to assemble and place Libre sensor on L Upper arm.  Pt knows they will able to scan for CBG 1 hr after sensor placed.  Pt instructed to replace sensor in 14 days.  Encouraged pt to scan for CBGs pre and post meals.  Reviewed healthy CBG goals for home.  Pt asked to seek refills for the sensors from their PCP (pt needs to find PCP after d/c).  Pt educated to  check fingerstick CBG with traditional CBG meter when glucose reading does not match how they feels.  RN made aware that CGM applied.  Also verbally reviewed how to use Insulin pens at home with pt.  Discussed how basal insulin works and importance of consistent timing with basal insulin.  Pt appreciative of all info and did not have any further questions for me at this time.    --Will follow patient during hospitalization--  Wyn Quaker RN, MSN, [place] Diabetes Coordinator Inpatient Glycemic Control Team Team Pager: [phone] (8a-5p)

## 2022-03-29 NOTE — Progress Notes (Signed)
Plan of Care Note for accepted transfer   Patient: Courtney Parks MRN: [id number]   DOA: 03/29/2022  Facility requesting transfer: [facility], Requesting Provider: Lennice Sites, DO. Reason for transfer: DKA and UTI. Facility course:  61 year old female with a past medical history of anxiety, depression, hypertension, hyperlipidemia, insulin requiring type II DM who presented to the emergency department with abdominal pain, nausea and vomiting in the setting of DKA and urinary tract infection.  The patient was given IV fluids, started on an insulin infusion and received ceftriaxone 1 g IVPB.  Plan of care: The patient is accepted for admission to [facility] unit, at [facility].  Author: Reubin Milan, MD 03/29/2022  Check [email or web address] for on-call coverage.  Nursing staff, Please call [facility] number on Amion as soon as patient's arrival, so appropriate admitting provider can evaluate the pt.

## 2022-03-29 NOTE — ED Triage Notes (Signed)
Pt c/o N/V and abdominal pain x 1 week.

## 2022-03-29 NOTE — Inpatient Diabetes Management (Addendum)
Inpatient Diabetes Program Recommendations  AACE/ADA: New Consensus Statement on Inpatient Glycemic Control (2015)  Target Ranges:  Prepandial:   less than 140 mg/dL      Peak postprandial:   less than 180 mg/dL (1-2 hours)      Critically ill patients:  140 - 180 mg/dL   Lab Results  Component Value Date   GLUCAP 239 (H) 03/29/2022   HGBA1C 12.6 (H) 08/12/2021    Latest Reference Range & Units 11/01/16 13:03 08/12/21 04:00  Hemoglobin A1C 4.8 - 5.6 % 12.9 (H) 12.6 (H)  (H): Data is abnormally high  Review of Glycemic Control  Latest Reference Range & Units 03/29/22 07:08 03/29/22 08:39 03/29/22 09:55  Glucose-Capillary 70 - 99 mg/dL 323 (H) 265 (H) 239 (H)  (H): Data is abnormally high Diabetes history: Type 2 DM Outpatient Diabetes medications: Trulicity 1.22 mg qwk Current orders for Inpatient glycemic control: IV insulin  Inpatient Diabetes Program Recommendations:    Continue with IV insulin. Consider adding A1C?  - At discharge, Toujeo ($52.27) is preferred for basal  Spoke with patient remotely regarding outpatient diabetes management. Patient verified home medications. 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  Reviewed patient's previous A1c of 12.6% and anticipate this admissions result will also be elevated. Explained what a A1c is and what it measures. Also reviewed goal A1c with patient, importance of good glucose control @ home, and blood sugar goals. Reviewed patho of DM, role of pancreas, survival skills, interventions, impact of poor glycemic control and risk for infection, DKA, need for insulin, vascular changes and other commorbidities.  Patient has a meter and reports checking only when she feels bad. Reviewed recommended frequency for checking CBGs and when to call MD. Reviewed [place] as a tool to help track blood sugars. Discussed risks vs benefits and associated cost. Secure chat sent to Md.  Patient does not have a PCP, so  encouraged to find one and make an appointment.  Will consult [facility] and outpt referral. Will also order insulin starter kit and LWWDM booklet. Discussed option for outpatient endocrinology, so will attach list to DC summary.  Denies drinking sugary beverages at home and tries to be mindful of CHO intake. Never been on insulin and has tried to control with diet. Admits to mindfulness being difficult. Reviewed importance, alternatives, importance of incorporating protein in with snacks and goal setting. Patient appreciative. Briefly discussed insulin pen use at home and patient feels comfortable. Reviewed contents of insulin flexpen starter kit. Reviewed all steps if insulin pen including attachment of needle, 2-unit air shot, dialing up dose, giving injection, removing needle, disposal of sharps, storage of unused insulin, disposal of insulin etc. Patient able to provide successful return demonstration. Also reviewed troubleshooting with insulin pen. MD to give patient Rxs for insulin pens and insulin pen needles.  Anticipate need for insulin at discharge.  Thanks, Bronson Curb, MSN, RNC-[facility] Diabetes Coordinator [phone] (8a-5p)

## 2022-03-29 NOTE — Telephone Encounter (Signed)
Pharmacy Patient Advocate Encounter  Insurance verification completed.    The patient is insured through [employer]   The patient is currently admitted and ran test claims for the following: Lantus, Levemir, Toujeo, Novolog, Humalog.  Copays and coinsurance results were relayed to Inpatient clinical team.

## 2022-03-29 NOTE — Progress Notes (Signed)
Patient transported to MRI via wheelchair. All drips paused per Hal Hope, MD. Will restart drips upon patient coming back to floor.

## 2022-03-29 NOTE — Discharge Instructions (Addendum)
Local Endocrinologists [facility] [phone]) Dr. Philemon Kingdom Dr. Elayne Snare [facility] [place] [phone] [phone]) Dr. Delrae Rend [facility] [phone]) Dr. Jacelyn Pi Dr. Anda Kraft [facility] [phone]) Dr. Daneil Dolin [facility] [phone]) [place] office]  [phone]) Shari Prows office] Dr. Lavone Orn Dr. Mee Hives [facility]) [phone]) Autumn Barbaraann Barthel Ronnald Ramp), PA Dr. Amalia Greenhouse Dr. Marsh Dolly. [facility] [phone]) Dr. Glade Lloyd [facility] [phone]) Dr. Orville Govern Dr. Lelon Huh Dr. Lavone Nian, FNP Dr. Carolynn Serve. [place] in [place] [phone])   So some things to remember: Take medication as prescribed Find a Primary care and make appointment. Call if outside of target (80-180 mg/dL, consistently) Check blood sugars atleast 2 times per day- First thing when waking and atleast (1-2) times 1-2 hours after meals Endocrinology? Write down your goals.  So... breakfast foods? Lunch foods? Dinner? Snacks? Incorporating protein?  Fun :)      Patient will need Monday Wednesday Friday wound VAC change.  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  Antibiotics per infectious disease.  Nonweightbearing to the right lower extremity.  Follow-up 1 week from discharge.

## 2022-03-30 ENCOUNTER — Encounter (HOSPITAL_COMMUNITY)
Admission: EM | Disposition: A | Discharge status: Home Health | Payer: Self-pay | Source: Home / Self Care | Attending: Internal Medicine

## 2022-03-30 ENCOUNTER — Observation Stay (HOSPITAL_COMMUNITY): Payer: BC Managed Care – PPO | Admitting: Certified Registered"

## 2022-03-30 ENCOUNTER — Other Ambulatory Visit: Payer: Self-pay

## 2022-03-30 ENCOUNTER — Encounter (HOSPITAL_COMMUNITY): Payer: Self-pay | Admitting: Anesthesiology

## 2022-03-30 DIAGNOSIS — Z8249 Family history of ischemic heart disease and other diseases of the circulatory system: Secondary | ICD-10-CM | POA: Diagnosis not present

## 2022-03-30 DIAGNOSIS — I10 Essential (primary) hypertension: Secondary | ICD-10-CM | POA: Diagnosis present

## 2022-03-30 DIAGNOSIS — Z888 Allergy status to other drugs, medicaments and biological substances status: Secondary | ICD-10-CM | POA: Diagnosis not present

## 2022-03-30 DIAGNOSIS — Z87891 Personal history of nicotine dependence: Secondary | ICD-10-CM | POA: Diagnosis not present

## 2022-03-30 DIAGNOSIS — Z87442 Personal history of urinary calculi: Secondary | ICD-10-CM | POA: Diagnosis not present

## 2022-03-30 DIAGNOSIS — E876 Hypokalemia: Secondary | ICD-10-CM | POA: Diagnosis not present

## 2022-03-30 DIAGNOSIS — I159 Secondary hypertension, unspecified: Secondary | ICD-10-CM

## 2022-03-30 DIAGNOSIS — Z794 Long term (current) use of insulin: Secondary | ICD-10-CM | POA: Diagnosis not present

## 2022-03-30 DIAGNOSIS — E860 Dehydration: Secondary | ICD-10-CM | POA: Diagnosis present

## 2022-03-30 DIAGNOSIS — L02611 Cutaneous abscess of right foot: Secondary | ICD-10-CM

## 2022-03-30 DIAGNOSIS — L089 Local infection of the skin and subcutaneous tissue, unspecified: Secondary | ICD-10-CM | POA: Diagnosis not present

## 2022-03-30 DIAGNOSIS — Z885 Allergy status to narcotic agent status: Secondary | ICD-10-CM | POA: Diagnosis not present

## 2022-03-30 DIAGNOSIS — E1110 Type 2 diabetes mellitus with ketoacidosis without coma: Secondary | ICD-10-CM

## 2022-03-30 DIAGNOSIS — E663 Overweight: Secondary | ICD-10-CM | POA: Diagnosis present

## 2022-03-30 DIAGNOSIS — N390 Urinary tract infection, site not specified: Secondary | ICD-10-CM

## 2022-03-30 DIAGNOSIS — Z91013 Allergy to seafood: Secondary | ICD-10-CM | POA: Diagnosis not present

## 2022-03-30 DIAGNOSIS — S91331A Puncture wound without foreign body, right foot, initial encounter: Secondary | ICD-10-CM | POA: Diagnosis present

## 2022-03-30 DIAGNOSIS — F32A Depression, unspecified: Secondary | ICD-10-CM | POA: Diagnosis present

## 2022-03-30 DIAGNOSIS — Z6831 Body mass index (BMI) 31.0-31.9, adult: Secondary | ICD-10-CM | POA: Diagnosis not present

## 2022-03-30 DIAGNOSIS — F419 Anxiety disorder, unspecified: Secondary | ICD-10-CM

## 2022-03-30 DIAGNOSIS — K760 Fatty (change of) liver, not elsewhere classified: Secondary | ICD-10-CM | POA: Diagnosis present

## 2022-03-30 DIAGNOSIS — E785 Hyperlipidemia, unspecified: Secondary | ICD-10-CM | POA: Diagnosis present

## 2022-03-30 DIAGNOSIS — L0390 Cellulitis, unspecified: Secondary | ICD-10-CM | POA: Diagnosis not present

## 2022-03-30 DIAGNOSIS — E11628 Type 2 diabetes mellitus with other skin complications: Secondary | ICD-10-CM | POA: Diagnosis not present

## 2022-03-30 DIAGNOSIS — L03115 Cellulitis of right lower limb: Secondary | ICD-10-CM | POA: Diagnosis present

## 2022-03-30 DIAGNOSIS — Y929 Unspecified place or not applicable: Secondary | ICD-10-CM | POA: Diagnosis not present

## 2022-03-30 DIAGNOSIS — R809 Proteinuria, unspecified: Secondary | ICD-10-CM | POA: Diagnosis present

## 2022-03-30 DIAGNOSIS — A419 Sepsis, unspecified organism: Secondary | ICD-10-CM | POA: Diagnosis present

## 2022-03-30 DIAGNOSIS — Z833 Family history of diabetes mellitus: Secondary | ICD-10-CM | POA: Diagnosis not present

## 2022-03-30 DIAGNOSIS — X58XXXA Exposure to other specified factors, initial encounter: Secondary | ICD-10-CM | POA: Diagnosis present

## 2022-03-30 HISTORY — PX: IRRIGATION AND DEBRIDEMENT FOOT: SHX6602

## 2022-03-30 LAB — GLUCOSE, CAPILLARY
Glucose-Capillary: 162 mg/dL — ABNORMAL HIGH (ref 70–99)
Glucose-Capillary: 165 mg/dL — ABNORMAL HIGH (ref 70–99)
Glucose-Capillary: 167 mg/dL — ABNORMAL HIGH (ref 70–99)
Glucose-Capillary: 172 mg/dL — ABNORMAL HIGH (ref 70–99)
Glucose-Capillary: 175 mg/dL — ABNORMAL HIGH (ref 70–99)
Glucose-Capillary: 175 mg/dL — ABNORMAL HIGH (ref 70–99)
Glucose-Capillary: 175 mg/dL — ABNORMAL HIGH (ref 70–99)
Glucose-Capillary: 179 mg/dL — ABNORMAL HIGH (ref 70–99)
Glucose-Capillary: 195 mg/dL — ABNORMAL HIGH (ref 70–99)
Glucose-Capillary: 195 mg/dL — ABNORMAL HIGH (ref 70–99)
Glucose-Capillary: 196 mg/dL — ABNORMAL HIGH (ref 70–99)
Glucose-Capillary: 244 mg/dL — ABNORMAL HIGH (ref 70–99)
Glucose-Capillary: 246 mg/dL — ABNORMAL HIGH (ref 70–99)
Glucose-Capillary: 277 mg/dL — ABNORMAL HIGH (ref 70–99)

## 2022-03-30 LAB — CBC
HCT: 32.9 % — ABNORMAL LOW (ref 36.0–46.0)
Hemoglobin: 11 g/dL — ABNORMAL LOW (ref 12.0–15.0)
MCH: 29.8 pg (ref 26.0–34.0)
MCHC: 33.4 g/dL (ref 30.0–36.0)
MCV: 89.2 fL (ref 80.0–100.0)
Platelets: 205 10*3/uL (ref 150–400)
RBC: 3.69 MIL/uL — ABNORMAL LOW (ref 3.87–5.11)
RDW: 12.4 % (ref 11.5–15.5)
WBC: 14.6 10*3/uL — ABNORMAL HIGH (ref 4.0–10.5)
nRBC: 0 % (ref 0.0–0.2)

## 2022-03-30 LAB — BETA-HYDROXYBUTYRIC ACID: Beta-Hydroxybutyric Acid: 2.46 mmol/L — ABNORMAL HIGH (ref 0.05–0.27)

## 2022-03-30 LAB — COMPREHENSIVE METABOLIC PANEL
ALT: 14 U/L (ref 0–44)
AST: 11 U/L — ABNORMAL LOW (ref 15–41)
Albumin: 2.6 g/dL — ABNORMAL LOW (ref 3.5–5.0)
Alkaline Phosphatase: 61 U/L (ref 38–126)
Anion gap: 7 (ref 5–15)
BUN: 9 mg/dL (ref 6–20)
CO2: 22 mmol/L (ref 22–32)
Calcium: 8.7 mg/dL — ABNORMAL LOW (ref 8.9–10.3)
Chloride: 111 mmol/L (ref 98–111)
Creatinine, Ser: 0.37 mg/dL — ABNORMAL LOW (ref 0.44–1.00)
GFR, Estimated: >60 mL/min (ref >60)
Glucose, Bld: 188 mg/dL — ABNORMAL HIGH (ref 70–99)
Potassium: 3 mmol/L — ABNORMAL LOW (ref 3.5–5.1)
Sodium: 140 mmol/L (ref 135–145)
Total Bilirubin: 0.6 mg/dL (ref 0.3–1.2)
Total Protein: 5.7 g/dL — ABNORMAL LOW (ref 6.5–8.1)

## 2022-03-30 LAB — PREGNANCY, URINE: Preg Test, Ur: NEGATIVE

## 2022-03-30 LAB — HEMOGLOBIN A1C
Hgb A1c MFr Bld: 10.5 % — ABNORMAL HIGH (ref 4.8–5.6)
Mean Plasma Glucose: 254.65 mg/dL

## 2022-03-30 LAB — URINE CULTURE

## 2022-03-30 LAB — PHOSPHORUS
Phosphorus: 1.3 mg/dL — ABNORMAL LOW (ref 2.5–4.6)
Phosphorus: 1 mg/dL — CL (ref 2.5–4.6)

## 2022-03-30 LAB — HIV ANTIBODY (ROUTINE TESTING W REFLEX): HIV Screen 4th Generation wRfx: NONREACTIVE

## 2022-03-30 LAB — MAGNESIUM: Magnesium: 1.7 mg/dL (ref 1.7–2.4)

## 2022-03-30 SURGERY — IRRIGATION AND DEBRIDEMENT FOOT
Anesthesia: Monitor Anesthesia Care | Site: Foot | Laterality: Right

## 2022-03-30 MED ORDER — FENTANYL CITRATE (PF) 100 MCG/2ML IJ SOLN
INTRAMUSCULAR | Status: DC | PRN
  Administered 2022-03-30 (×2): 50 ug via INTRAVENOUS

## 2022-03-30 MED ORDER — INSULIN ASPART 100 UNIT/ML IJ SOLN
0.0000 [IU] | Freq: Three times a day (TID) | INTRAMUSCULAR | Status: DC
  Administered 2022-03-30: 3 [IU] via SUBCUTANEOUS
  Administered 2022-03-30: 5 [IU] via SUBCUTANEOUS
  Administered 2022-03-31: 8 [IU] via SUBCUTANEOUS
  Administered 2022-03-31: 5 [IU] via SUBCUTANEOUS
  Administered 2022-03-31: 3 [IU] via SUBCUTANEOUS
  Administered 2022-04-01: 5 [IU] via SUBCUTANEOUS
  Administered 2022-04-01 (×2): 3 [IU] via SUBCUTANEOUS
  Administered 2022-04-02: 2 [IU] via SUBCUTANEOUS
  Administered 2022-04-02 (×2): 3 [IU] via SUBCUTANEOUS
  Administered 2022-04-04 (×3): 5 [IU] via SUBCUTANEOUS
  Administered 2022-04-05: 3 [IU] via SUBCUTANEOUS
  Administered 2022-04-05: 5 [IU] via SUBCUTANEOUS

## 2022-03-30 MED ORDER — BUPIVACAINE HCL (PF) 0.5 % IJ SOLN
INTRAMUSCULAR | Status: DC | PRN
  Administered 2022-03-30: 10 mL

## 2022-03-30 MED ORDER — MIDAZOLAM HCL 2 MG/2ML IJ SOLN
INTRAMUSCULAR | Status: AC
  Filled 2022-03-30: qty 2

## 2022-03-30 MED ORDER — OXYCODONE HCL 5 MG PO TABS: 5.0000 mg | ORAL_TABLET | Freq: Once | ORAL | Status: DC | PRN

## 2022-03-30 MED ORDER — LACTATED RINGERS IV SOLN: INTRAVENOUS | Status: DC | PRN

## 2022-03-30 MED ORDER — HYDRALAZINE HCL 20 MG/ML IJ SOLN: 10.0000 mg | INTRAMUSCULAR | Status: DC | PRN

## 2022-03-30 MED ORDER — METOPROLOL TARTRATE 5 MG/5ML IV SOLN: 5.0000 mg | INTRAVENOUS | Status: DC | PRN

## 2022-03-30 MED ORDER — INSULIN GLARGINE-YFGN 100 UNIT/ML ~~LOC~~ SOLN
20.0000 [IU] | Freq: Every day | SUBCUTANEOUS | Status: DC
  Administered 2022-03-30: 20 [IU] via SUBCUTANEOUS
  Filled 2022-03-30 (×2): qty 0.2

## 2022-03-30 MED ORDER — TRAZODONE HCL 50 MG PO TABS: 50.0000 mg | ORAL_TABLET | Freq: Every evening | ORAL | Status: DC | PRN

## 2022-03-30 MED ORDER — FENTANYL CITRATE PF 50 MCG/ML IJ SOSY: 25.0000 ug | PREFILLED_SYRINGE | INTRAMUSCULAR | Status: DC | PRN

## 2022-03-30 MED ORDER — VANCOMYCIN HCL 1000 MG IV SOLR
INTRAVENOUS | Status: AC
  Filled 2022-03-30: qty 20

## 2022-03-30 MED ORDER — SENNOSIDES-DOCUSATE SODIUM 8.6-50 MG PO TABS: 1.0000 | ORAL_TABLET | Freq: Every evening | ORAL | Status: DC | PRN

## 2022-03-30 MED ORDER — PROPOFOL 1000 MG/100ML IV EMUL
INTRAVENOUS | Status: AC
  Filled 2022-03-30: qty 100

## 2022-03-30 MED ORDER — POTASSIUM CHLORIDE CRYS ER 20 MEQ PO TBCR
40.0000 meq | EXTENDED_RELEASE_TABLET | Freq: Once | ORAL | Status: DC
  Filled 2022-03-30: qty 2

## 2022-03-30 MED ORDER — LIDOCAINE 2% (20 MG/ML) 5 ML SYRINGE
INTRAMUSCULAR | Status: DC | PRN
  Administered 2022-03-30: 100 mg via INTRAVENOUS

## 2022-03-30 MED ORDER — PROPOFOL 500 MG/50ML IV EMUL
INTRAVENOUS | Status: DC | PRN
  Administered 2022-03-30: 50 ug/kg/min via INTRAVENOUS

## 2022-03-30 MED ORDER — INSULIN ASPART 100 UNIT/ML IJ SOLN
5.0000 [IU] | Freq: Once | INTRAMUSCULAR | Status: DC
Start: 2022-03-30 — End: 2022-03-30

## 2022-03-30 MED ORDER — SODIUM CHLORIDE 0.9 % IR SOLN
Status: DC | PRN
  Administered 2022-03-30: 3000 mL

## 2022-03-30 MED ORDER — ONDANSETRON HCL 4 MG/2ML IJ SOLN
INTRAMUSCULAR | Status: AC
  Filled 2022-03-30: qty 2

## 2022-03-30 MED ORDER — PROPOFOL 10 MG/ML IV BOLUS
INTRAVENOUS | Status: AC
  Filled 2022-03-30: qty 20

## 2022-03-30 MED ORDER — OXYCODONE HCL 5 MG/5ML PO SOLN: 5.0000 mg | Freq: Once | ORAL | Status: DC | PRN

## 2022-03-30 MED ORDER — MIDAZOLAM HCL 5 MG/5ML IJ SOLN
INTRAMUSCULAR | Status: DC | PRN
  Administered 2022-03-30: 2 mg via INTRAVENOUS

## 2022-03-30 MED ORDER — POTASSIUM CHLORIDE 10 MEQ/100ML IV SOLN
10.0000 meq | INTRAVENOUS | Status: DC
  Administered 2022-03-30 (×2): 10 meq via INTRAVENOUS
  Filled 2022-03-30 (×2): qty 100

## 2022-03-30 MED ORDER — 0.9 % SODIUM CHLORIDE (POUR BTL) OPTIME
TOPICAL | Status: DC | PRN
  Administered 2022-03-30: 1000 mL

## 2022-03-30 MED ORDER — PROPOFOL 10 MG/ML IV BOLUS
INTRAVENOUS | Status: DC | PRN
  Administered 2022-03-30: 20 mg via INTRAVENOUS

## 2022-03-30 MED ORDER — SODIUM CHLORIDE 0.9 % IV SOLN: INTRAVENOUS | Status: DC | PRN

## 2022-03-30 MED ORDER — AMISULPRIDE (ANTIEMETIC) 5 MG/2ML IV SOLN: 10.0000 mg | Freq: Once | INTRAVENOUS | Status: DC | PRN

## 2022-03-30 MED ORDER — CHLORHEXIDINE GLUCONATE CLOTH 2 % EX PADS
6.0000 | MEDICATED_PAD | Freq: Every day | CUTANEOUS | Status: DC
  Administered 2022-03-30 – 2022-04-05 (×7): 6 via TOPICAL

## 2022-03-30 MED ORDER — LIDOCAINE HCL (PF) 2 % IJ SOLN
INTRAMUSCULAR | Status: AC
  Filled 2022-03-30: qty 5

## 2022-03-30 MED ORDER — GUAIFENESIN 100 MG/5ML PO LIQD: 5.0000 mL | ORAL | Status: DC | PRN

## 2022-03-30 MED ORDER — INSULIN ASPART 100 UNIT/ML IJ SOLN
8.0000 [IU] | Freq: Once | INTRAMUSCULAR | Status: AC
  Administered 2022-03-30: 8 [IU] via SUBCUTANEOUS

## 2022-03-30 MED ORDER — ONDANSETRON HCL 4 MG/2ML IJ SOLN
INTRAMUSCULAR | Status: DC | PRN
  Administered 2022-03-30: 4 mg via INTRAVENOUS

## 2022-03-30 MED ORDER — FENTANYL CITRATE (PF) 100 MCG/2ML IJ SOLN
INTRAMUSCULAR | Status: AC
  Filled 2022-03-30: qty 2

## 2022-03-30 MED ORDER — POTASSIUM PHOSPHATES 15 MMOLE/5ML IV SOLN
30.0000 mmol | Freq: Once | INTRAVENOUS | Status: AC
  Administered 2022-03-30: 30 mmol via INTRAVENOUS
  Filled 2022-03-30: qty 10

## 2022-03-30 MED ORDER — VANCOMYCIN HCL 1 G IV SOLR
INTRAVENOUS | Status: DC | PRN
  Administered 2022-03-30: 1000 mg via TOPICAL

## 2022-03-30 MED ORDER — BUPIVACAINE HCL (PF) 0.5 % IJ SOLN
INTRAMUSCULAR | Status: AC
  Filled 2022-03-30: qty 30

## 2022-03-30 MED ORDER — ONDANSETRON HCL 4 MG/2ML IJ SOLN: 4.0000 mg | Freq: Once | INTRAMUSCULAR | Status: DC | PRN

## 2022-03-30 MED ORDER — IPRATROPIUM-ALBUTEROL 0.5-2.5 (3) MG/3ML IN SOLN: 3.0000 mL | RESPIRATORY_TRACT | Status: DC | PRN

## 2022-03-30 SURGICAL SUPPLY — 57 items
BAG COUNTER SPONGE SURGICOUNT (BAG) ×2 IMPLANT
BLADE SURG 15 STRL LF DISP TIS (BLADE) ×1 IMPLANT
BLADE SURG 15 STRL SS (BLADE) ×2
BNDG ELASTIC 3X5.8 VLCR STR LF (GAUZE/BANDAGES/DRESSINGS) ×2 IMPLANT
BNDG ELASTIC 4X5.8 VLCR STR LF (GAUZE/BANDAGES/DRESSINGS) ×2 IMPLANT
BNDG ESMARK 4X9 LF (GAUZE/BANDAGES/DRESSINGS) IMPLANT
BNDG GAUZE DERMACEA FLUFF (GAUZE/BANDAGES/DRESSINGS) ×1
BNDG GAUZE DERMACEA FLUFF 4 (GAUZE/BANDAGES/DRESSINGS) ×1 IMPLANT
CHLORAPREP W/TINT 26 (MISCELLANEOUS) IMPLANT
CNTNR URN SCR LID CUP LEK RST (MISCELLANEOUS) IMPLANT
CONT SPEC 4OZ STRL OR WHT (MISCELLANEOUS)
COVER BACK TABLE 60X90IN (DRAPES) ×2 IMPLANT
CUFF TOURN SGL QUICK 18X4 (TOURNIQUET CUFF) IMPLANT
CUFF TOURN SGL QUICK 24 (TOURNIQUET CUFF)
CUFF TRNQT CYL 24X4X16.5-23 (TOURNIQUET CUFF) IMPLANT
DRAPE 3/4 80X56 (DRAPES) ×2 IMPLANT
DRAPE EXTREMITY T 121X128X90 (DISPOSABLE) ×2 IMPLANT
DRAPE SHEET LG 3/4 BI-LAMINATE (DRAPES) ×2 IMPLANT
DRAPE U-SHAPE 47X51 STRL (DRAPES) ×2 IMPLANT
DRSG PAD ABDOMINAL 8X10 ST (GAUZE/BANDAGES/DRESSINGS) ×1 IMPLANT
ELECT REM PT RETURN 15FT ADLT (MISCELLANEOUS) ×2 IMPLANT
GAUZE PACKING IODOFORM 1/4X15 (PACKING) ×1 IMPLANT
GAUZE SPONGE 4X4 12PLY STRL (GAUZE/BANDAGES/DRESSINGS) ×2 IMPLANT
GAUZE XEROFORM 1X8 LF (GAUZE/BANDAGES/DRESSINGS) IMPLANT
GLOVE BIO SURGEON STRL SZ7.5 (GLOVE) ×2 IMPLANT
GLOVE BIOGEL PI IND STRL 8 (GLOVE) ×1 IMPLANT
GLOVE BIOGEL PI INDICATOR 8 (GLOVE) ×1
GOWN STRL REUS W/ TWL XL LVL3 (GOWN DISPOSABLE) ×1 IMPLANT
GOWN STRL REUS W/TWL XL LVL3 (GOWN DISPOSABLE) ×2
GRAFT MYRIAD 3 LAYER 10X10 (Graft) ×1 IMPLANT
KIT BASIN OR (CUSTOM PROCEDURE TRAY) ×2 IMPLANT
KIT TURNOVER KIT A (KITS) IMPLANT
MANIFOLD NEPTUNE II (INSTRUMENTS) ×2 IMPLANT
NDL HYPO 25X1 1.5 SAFETY (NEEDLE) ×1 IMPLANT
NEEDLE HYPO 25X1 1.5 SAFETY (NEEDLE) ×2 IMPLANT
NS IRRIG 1000ML POUR BTL (IV SOLUTION) IMPLANT
PADDING CAST ABS 4INX4YD NS (CAST SUPPLIES) ×1
PADDING CAST ABS COTTON 4X4 ST (CAST SUPPLIES) ×1 IMPLANT
SET IRRIG Y TYPE TUR BLADDER L (SET/KITS/TRAYS/PACK) IMPLANT
SPONGE T-LAP 4X18 ~~LOC~~+RFID (SPONGE) ×2 IMPLANT
STAPLER VISISTAT 35W (STAPLE) IMPLANT
STOCKINETTE 6  STRL (DRAPES) ×2
STOCKINETTE 6 STRL (DRAPES) ×1 IMPLANT
SUCTION FRAZIER HANDLE 10FR (MISCELLANEOUS)
SUCTION TUBE FRAZIER 10FR DISP (MISCELLANEOUS) IMPLANT
SUT ETHILON 2 0 PS N (SUTURE) ×1 IMPLANT
SUT ETHILON 3 0 PS 1 (SUTURE) IMPLANT
SUT ETHILON 4 0 PS 2 18 (SUTURE) IMPLANT
SUT MNCRL AB 3-0 PS2 18 (SUTURE) IMPLANT
SUT MNCRL AB 4-0 PS2 18 (SUTURE) IMPLANT
SUT VIC AB 2-0 SH 27 (SUTURE)
SUT VIC AB 2-0 SH 27XBRD (SUTURE) IMPLANT
SYR BULB EAR ULCER 3OZ GRN STR (SYRINGE) ×2 IMPLANT
SYR CONTROL 10ML LL (SYRINGE) ×2 IMPLANT
TUBE IRRIGATION SET MISONIX (TUBING) IMPLANT
UNDERPAD 30X36 HEAVY ABSORB (UNDERPADS AND DIAPERS) ×2 IMPLANT
YANKAUER SUCT BULB TIP NO VENT (SUCTIONS) ×2 IMPLANT

## 2022-03-30 NOTE — Progress Notes (Signed)
Orthopedic Tech Progress Note Patient Details:  Courtney Parks [birth year] [id number]  Patient ID: Courtney Parks, female   DOB: [birth year], 61 y.o.   MRN: [id number]  Courtney Parks 03/30/2022, 10:52 AM Post op shoe applied to right foot

## 2022-03-30 NOTE — Progress Notes (Signed)
PROGRESS NOTE    Courtney Parks  TDD:[id number] DOB: 25-Jun-1961 DOA: 03/29/2022 PCP: Pcp, No   Brief Narrative:  61 year old with history of anxiety, depression, DM 2, HLD, HTN, lithiasis comes to the hospital with complaints of right lower quadrant abdominal pain and CVA tenderness on the right side ongoing for the past week along with fatigue.  UA was consistent with glucosuria and urinary tract infection.  CT abdomen pelvis showed nonobstructive left-sided nephrolithiasis and hepatic steatosis.  She was found to have puncture wound of her right foot and was in diabetic ketoacidosis.   Assessment & Plan:  Principal Problem:   DKA (diabetic ketoacidosis) ([place]) Active Problems:   Diabetic infection of right foot ([place])   Hyperlipidemia   Hypertension   History of kidney stones   Anxiety and depression   Hypercalcemia   Puncture wound of plantar aspect of foot   Acute UTI (urinary tract infection)   Abscess of right foot    DKA (diabetic ketoacidosis) (HCC) Insulin-dependent diabetes mellitus type 2 -Hemoglobin A1c 10.3.  Started on DKA protocol.  Anion gap closed this morning.  We will slowly transition her to subcu insulin. - Consult diabetic coordinator   Sepsis secondary to puncture wound of plantar aspect of right foot Cellulitis of the right foot with loculated abscess Status post I&D of the right foot -Sepsis evidenced by fever, tachycardia, tachypnea and leukocytosis.  Sepsis physiology improving.  Elevated inflammatory markers - Empiric antibiotics-IV vancomycin, Flagyl and cefepime -MRI shows small loculated abscess -Seen by podiatry     Hyperlipidemia May benefit from statin therapy.     Hypertension Pain control. Consider starting ACE inhibitor if BP elevated.     Acute UTI (urinary tract infection)   History of kidney stones.  Left-sided nonobstructive stone On IV Rocephin.  IV fluids follow-up cultures     Anxiety and depression Ambien at  bedtime while in the hospital. Follow-up with [facility] and/or PCP.     Hypercalcemia Likely from dehydration.  Resolved this morning  Hypokalemia/hypophosphatemia - Repletion as needed     DVT prophylaxis: enoxaparin (LOVENOX) injection 40 mg Start: 03/29/22 1800 Code Status: Full code Family Communication:    Maintain hospital stay for IV antibiotics, DKA management.   Subjective: Patient seen and examined at bedside, she went to the OR this morning for right foot debridement.  No complaints at this time.    Examination:  General exam: Appears calm and comfortable  Respiratory system: Clear to auscultation. Respiratory effort normal. Cardiovascular system: S1 & S2 heard, RRR. No JVD, murmurs, rubs, gallops or clicks. No pedal edema. Gastrointestinal system: Abdomen is nondistended, soft and nontender. No organomegaly or masses felt. Normal bowel sounds heard. Central nervous system: Alert and oriented. No focal neurological deficits. Extremities: Right foot dressing is currently in place Skin: No rashes, lesions or ulcers Psychiatry: Judgement and insight appear normal. Mood & affect appropriate.     Objective: Vitals:   03/30/22 0100 03/30/22 0200 03/30/22 0300 03/30/22 0400  BP: (!) 120/53 (!) 130/52 (!) 121/54 (!) 120/52  Pulse: 92 90 92 91  Resp: (!) 27 (!) 25 (!) 27 (!) 31  Temp:      TempSrc:      SpO2: 91% 92% 91% 93%  Weight:      Height:        Intake/Output Summary (Last 24 hours) at 03/30/2022 0743 Last data filed at 03/30/2022 6237 Gross per 24 hour  Intake 4565.34 ml  Output --  Net 4565.34 ml  Filed Weights   03/29/22 0614  Weight: 81.2 kg     Data Reviewed:   CBC: Recent Labs  Lab 03/29/22 0626 03/29/22 0656 03/30/22 0259  WBC 20.3*  --  14.6*  NEUTROABS 16.9*  --   --   HGB 14.0 13.6 11.0*  HCT 40.6 40.0 32.9*  MCV 89.2  --  89.2  PLT 244  --  191   Basic Metabolic Panel: Recent Labs  Lab 03/29/22 0626 03/29/22 0656  03/29/22 1408 03/30/22 0259  NA 132* 131* 135 140  K 4.5 5.3* 2.9* 3.0*  CL 96*  --  106 111  CO2 15*  --  19* 22  GLUCOSE 361*  --  215* 188*  BUN 15  --  10 9  CREATININE 0.68  --  0.50 0.37*  CALCIUM 10.4*  --  9.2 8.7*   GFR: Estimated Creatinine Clearance: 81.9 mL/min (A) (by C-G formula based on SCr of 0.37 mg/dL (L)). Liver Function Tests: Recent Labs  Lab 03/29/22 0626 03/30/22 0259  AST 16 11*  ALT 16 14  ALKPHOS 76 61  BILITOT 0.8 0.6  PROT 7.5 5.7*  ALBUMIN 4.3 2.6*   Recent Labs  Lab 03/29/22 0626  LIPASE 16   No results for input(s): "AMMONIA" in the last 168 hours. Coagulation Profile: No results for input(s): "INR", "PROTIME" in the last 168 hours. Cardiac Enzymes: No results for input(s): "CKTOTAL", "CKMB", "CKMBINDEX", "TROPONINI" in the last 168 hours. BNP (last 3 results) No results for input(s): "PROBNP" in the last 8760 hours. HbA1C: Recent Labs    03/29/22 1403  HGBA1C 10.3*   CBG: Recent Labs  Lab 03/30/22 0159 03/30/22 0258 03/30/22 0357 03/30/22 0522 03/30/22 0652  GLUCAP 175* 172* 195* 175* 165*   Lipid Profile: No results for input(s): "CHOL", "HDL", "LDLCALC", "TRIG", "CHOLHDL", "LDLDIRECT" in the last 72 hours. Thyroid Function Tests: No results for input(s): "TSH", "T4TOTAL", "FREET4", "T3FREE", "THYROIDAB" in the last 72 hours. Anemia Panel: No results for input(s): "VITAMINB12", "FOLATE", "FERRITIN", "TIBC", "IRON", "RETICCTPCT" in the last 72 hours. Sepsis Labs: No results for input(s): "PROCALCITON", "LATICACIDVEN" in the last 168 hours.  Recent Results (from the past 240 hour(s))  MRSA Next Gen by PCR, Nasal     Status: None   Collection Time: 03/29/22  2:04 PM   Specimen: Nasal Mucosa; Nasal Swab  Result Value Ref Range Status   MRSA by PCR Next Gen NOT DETECTED NOT DETECTED Final    Comment: (NOTE) The GeneXpert MRSA Assay (FDA approved for NASAL specimens only), is one component of a comprehensive MRSA  colonization surveillance program. It is not intended to diagnose MRSA infection nor to guide or monitor treatment for MRSA infections. Test performance is not FDA approved in patients less than 59 years old. Performed at [facility], [place] [address], [place], [place] [address]          Radiology Studies: MR FOOT RIGHT W WO CONTRAST  Result Date: 03/29/2022 CLINICAL DATA:  Foot swelling, diabetic, osteomyelitis suspected. EXAM: MRI OF THE RIGHT FOREFOOT WITHOUT AND WITH CONTRAST TECHNIQUE: Multiplanar, multisequence MR imaging of the right forefoot was performed before and after the administration of intravenous contrast. CONTRAST:  45m GADAVIST GADOBUTROL 1 MMOL/ML IV SOLN COMPARISON:  None Available. FINDINGS: Bones/Joint/Cartilage Marrow signal is within normal limits. No evidence of osteomyelitis. No evidence of fracture or significant arthropathy. Ligaments Lisfranc and collateral ligaments are intact. 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 Flexor and extensor tendons appear intact. Soft tissues There is a peripherally enhancing loculated fluid collection about the plantar aspect of the foot at the level of the fourth metatarsal measuring approximately 0.9 x 1.1 x 3.7 cm. Generalized subcutaneous soft tissue edema about the dorsum and plantar aspect of the foot. There is skin irregularity about the plantar aspect of the foot about the level of the above-mentioned abscess concerning for deep skin wound, correlate with physical examination findings. IMPRESSION: 1.  No evidence of osteomyelitis. 2. Marked skin thickening and subcutaneous soft tissue edema about the plantar and dorsal aspect of the foot. 3. Peripherally enhancing loculated fluid collection about the plantar aspect of the foot at the level of the fourth metatarsal measuring approximately 0.9 x 1.1 x 3.7 cm most consistent with an  abscess. There is a skin irregularity about the above-mentioned collection, likely a skin wound. Correlate with physical examination findings. Electronically Signed   By: Keane Police D.O.   On: 03/29/2022 22:20   DG Foot 2 Views Right  Result Date: 03/29/2022 CLINICAL DATA:  Infected puncture wound in the plantar right foot. EXAM: RIGHT FOOT - 2 VIEW COMPARISON:  Right foot radiograph dated 01/12/2009. FINDINGS: There is no acute fracture or dislocation. The bones are mildly osteopenic. No bone erosion or periosteal elevation. There is soft tissue swelling of the forefoot. Subcutaneous edema or tiny pocket of air in the soft tissues of the plantar forefoot. No radiopaque foreign object. IMPRESSION: 1. No acute fracture or dislocation. No evidence of acute osteomyelitis by radiograph. 2. Soft tissue swelling of the forefoot and possible tiny pockets of gas in the soft tissues of the plantar forefoot. No radiopaque foreign object. Electronically Signed   By: Anner Crete M.D.   On: 03/29/2022 19:35   CT Abdomen Pelvis W Contrast  Result Date: 03/29/2022 CLINICAL DATA:  RLQ abdominal pain (Age >= 14y) EXAM: CT ABDOMEN AND PELVIS WITH CONTRAST TECHNIQUE: Multidetector CT imaging of the abdomen and pelvis was performed using the standard protocol following bolus administration of intravenous contrast. RADIATION DOSE REDUCTION: This exam was performed according to the departmental dose-optimization program which includes automated exposure control, adjustment of the mA and/or kV according to patient size and/or use of iterative reconstruction technique. CONTRAST:  161m OMNIPAQUE IOHEXOL 300 MG/ML  SOLN COMPARISON:  CT abdomen pelvis 07/10/2021 FINDINGS: Lower chest: No acute abnormality. Hepatobiliary: Mildly lower liver density, in part due to contrast timing but may suggest a degree of hepatic steatosis. Multiple tiny liver hypodensities, statistically likely to be small cysts or hemangiomas. The  gallbladder is surgically absent. Pancreas: Unremarkable. No pancreatic ductal dilatation or surrounding inflammatory changes. Spleen: Normal in size without focal abnormality. Small adjacent splenules. Adrenals/Urinary Tract: Adrenal glands are unremarkable. Multiple small bilateral renal cysts, some of which are simple and several too small to characterize. These are seen on multiple prior exams and no follow-up imaging is recommended. There are nonobstructive 2 mm stones in the left upper and lower poles. No hydronephrosis or ureterolithiasis. Stomach/Bowel: The stomach is within normal limits. There is no evidence of bowel obstruction.The appendix is normal. Scattered colonic diverticula. No diverticulitis. Vascular/Lymphatic: Aortoiliac atherosclerosis. No AAA. No lymphadenopathy. Reproductive: Smalls subserosal or pedunculated calcified uterine fibroid posteriorly. No adnexal mass. Other: Small fat containing left inguinal hernia. No bowel containing hernia. No ascites. No free air. Punctate subcutaneous free gas in the left lower abdomen likely related to medication injection. Musculoskeletal: Unchanged anterior L5 compression deformity with 25% height loss.  No acute osseous abnormality. Mild degenerative changes of the spine with grade 1 anterolisthesis at L4-L5. Mild bilateral hip osteoarthritis. No significant retropulsion. IMPRESSION: No acute abdominopelvic abnormality.  Normal appendix. Nonobstructive left-sided nephrolithiasis. Mild hepatic steatosis. Additional chronic findings as described above. Electronically Signed   By: Maurine Simmering M.D.   On: 03/29/2022 08:23        Scheduled Meds:  Chlorhexidine Gluconate Cloth  6 each Topical Q0600   enoxaparin (LOVENOX) injection  40 mg Subcutaneous Q24H   Continuous Infusions:  ceFEPime (MAXIPIME) IV Stopped (03/30/22 0227)   dextrose 5% lactated ringers 125 mL/hr at 03/30/22 0608   insulin 3.8 Units/hr (03/30/22 7014)   lactated ringers Stopped  (03/29/22 1011)   metronidazole 100 mL/hr at 03/30/22 0608   vancomycin       LOS: 0 days   Time spent= 35 mins    Aithana Kushner Arsenio Loader, MD [facility]  If 7PM-7AM, please contact night-coverage  03/30/2022, 7:43 AM

## 2022-03-30 NOTE — Transfer of Care (Signed)
Immediate Anesthesia Transfer of Care Note  Patient: Courtney Parks  Procedure(s) Performed: IRRIGATION AND DEBRIDEMENT FOOT (Right: Foot)  Patient Location: PACU  Anesthesia Type:MAC  Level of Consciousness: awake and alert   Airway & Oxygen Therapy: Patient Spontanous Breathing and Patient connected to face mask oxygen  Post-op Assessment: Report given to RN and Post -op Vital signs reviewed and stable  Post vital signs: Reviewed and stable  Last Vitals:  Vitals Value Taken Time  BP 130/67 03/30/22 0901  Temp    Pulse 95 03/30/22 0901  Resp 16 03/30/22 0901  SpO2 95 % 03/30/22 0901  Vitals shown include unvalidated device data.  Last Pain:  Vitals:   03/30/22 0400  TempSrc:   PainSc: Asleep         Complications: No notable events documented.

## 2022-03-30 NOTE — H&P (Signed)
History and Physical Interval Note:  03/30/2022 8:04 AM  Courtney Parks  has presented today for surgery, with the diagnosis of right foot abscess.  The various methods of treatment have been discussed with the patient and family. After consideration of risks, benefits and other options for treatment, the patient has consented to   Procedure(s): IRRIGATION AND DEBRIDEMENT FOOT (Right) as a surgical intervention.  The patient's history has been reviewed, patient examined, no change in status, stable for surgery.  I have reviewed the patient's chart and labs.  Questions were answered to the patient's satisfaction.     [name]

## 2022-03-30 NOTE — Brief Op Note (Signed)
03/30/2022  9:03 AM  PATIENT:  Courtney Parks  61 y.o. female  PRE-OPERATIVE DIAGNOSIS:  RIGHT FOOT ABSCESS  POST-OPERATIVE DIAGNOSIS:  RIGHT FOOT ABSCESS  PROCEDURE:  Procedure(s): IRRIGATION AND DEBRIDEMENT FOOT (Right)  SURGEON:  Surgeon(s) and Role:    * Keen Ewalt, Stephan Minister, DPM - Primary  PHYSICIAN ASSISTANT:   ASSISTANTS: none   ANESTHESIA:   local and MAC  EBL: 5 cc  BLOOD ADMINISTERED:none  DRAINS: none   LOCAL MEDICATIONS USED:  MARCAINE    and Amount: 12 ml  SPECIMEN: Abscess right foot  DISPOSITION OF SPECIMEN:   Microbiology  COUNTS:  YES  TOURNIQUET:   Total Tourniquet Time Documented: Ankle (Right) - 18 minutes Total: Ankle (Right) - 18 minutes   DICTATION: .Note written in EPIC  PLAN OF CARE: Admit to inpatient   PATIENT DISPOSITION:  PACU - hemodynamically stable.   Delay start of Pharmacological VTE agent (>24hrs) due to surgical blood loss or risk of bleeding: no

## 2022-03-30 NOTE — Anesthesia Postprocedure Evaluation (Signed)
Anesthesia Post Note  Patient: Courtney Parks  Procedure(s) Performed: IRRIGATION AND DEBRIDEMENT FOOT (Right: Foot)     Patient location during evaluation: PACU Anesthesia Type: MAC Level of consciousness: awake and alert Pain management: pain level controlled Vital Signs Assessment: post-procedure vital signs reviewed and stable Respiratory status: spontaneous breathing, nonlabored ventilation and respiratory function stable Cardiovascular status: blood pressure returned to baseline and stable Postop Assessment: no apparent nausea or vomiting Anesthetic complications: no   No notable events documented.  Last Vitals:  Vitals:   03/30/22 0915 03/30/22 0935  BP: 135/72   Pulse: 93 94  Resp: 12 (!) 21  Temp:    SpO2: 96% 97%    Last Pain:  Vitals:   03/30/22 0915  TempSrc:   PainSc: 0-No pain                 Lidia Collum

## 2022-03-30 NOTE — Inpatient Diabetes Management (Signed)
Inpatient Diabetes Program Recommendations  AACE/ADA: New Consensus Statement on Inpatient Glycemic Control (2015)  Target Ranges:  Prepandial:   less than 140 mg/dL      Peak postprandial:   less than 180 mg/dL (1-2 hours)      Critically ill patients:  140 - 180 mg/dL   Lab Results  Component Value Date   GLUCAP 175 (H) 03/30/2022   HGBA1C 10.3 (H) 03/29/2022     Diabetes history: Type 2 DM Outpatient Diabetes medications: Trulicity 4.27 mg qwk Current orders for Inpatient glycemic control: IV insulin  Inpatient Diabetes Program Recommendations:    When ready to transition consider: - Semglee 35 units two hours prior to discontinuation and QD to follow - Novolog 0-15 units TID & HS  Thanks, Bronson Curb, MSN, RNC-OB Diabetes Coordinator [phone] (8a-5p)

## 2022-03-30 NOTE — Anesthesia Procedure Notes (Signed)
Date/Time: 03/30/2022 8:21 AM  Performed by: Sharlette Dense, CRNAOxygen Delivery Method: Simple face mask

## 2022-03-30 NOTE — Anesthesia Preprocedure Evaluation (Signed)
Anesthesia Evaluation  Patient identified by MRN, date of birth, ID band Patient awake    Reviewed: Allergy & Precautions, NPO status , Patient's Chart, lab work & pertinent test results  History of Anesthesia Complications Negative for: history of anesthetic complications  Airway Mallampati: II  TM Distance: >3 FB Neck ROM: Full    Dental  (+) Poor Dentition, Missing   Pulmonary neg pulmonary ROS, former smoker,    Pulmonary exam normal        Cardiovascular hypertension, Normal cardiovascular exam     Neuro/Psych Anxiety Depression negative neurological ROS     GI/Hepatic negative GI ROS, Neg liver ROS,   Endo/Other  diabetes (A1c 10.3), Poorly Controlled, Type 2  Renal/GU negative Renal ROS  negative genitourinary   Musculoskeletal negative musculoskeletal ROS (+)   Abdominal   Peds  Hematology  (+) Blood dyscrasia, anemia ,   Anesthesia Other Findings Right foot abscess  Reproductive/Obstetrics                           Anesthesia Physical Anesthesia Plan  ASA: 3  Anesthesia Plan: MAC   Post-op Pain Management: Ofirmev IV (intra-op)* and Toradol IV (intra-op)*   Induction: Intravenous  PONV Risk Score and Plan: 2 and Propofol infusion, TIVA and Treatment may vary due to age or medical condition  Airway Management Planned: Natural Airway, Nasal Cannula and Simple Face Mask  Additional Equipment: None  Intra-op Plan:   Post-operative Plan:   Informed Consent: I have reviewed the patients History and Physical, chart, labs and discussed the procedure including the risks, benefits and alternatives for the proposed anesthesia with the patient or authorized representative who has indicated his/her understanding and acceptance.       Plan Discussed with:   Anesthesia Plan Comments:        Anesthesia Quick Evaluation

## 2022-03-30 NOTE — Op Note (Signed)
Patient Name: Courtney Parks DOB: 1961-07-28  MRN: [id number]   Date of Service: 03/29/2022 - 03/30/2022  Surgeon: Dr. Lanae Crumbly, DPM Assistants: None Pre-operative Diagnosis:  Abscess right foot Diabetic foot infection Post-operative Diagnosis:  Abscess right foot Diabetic foot infection Procedures:  1) incision and drainage of multiple fascial planes of right foot Pathology/Specimens: ID Type Source Tests Collected by Time Destination  A : RIGHT FOOT ABSCESS Tissue PATH Other AEROBIC/ANAEROBIC CULTURE W GRAM STAIN (SURGICAL/DEEP WOUND) [name], DPM 03/30/2022 8341    Anesthesia: Monitored anesthesia care with local Hemostasis:  Total Tourniquet Time Documented: Ankle (Right) - 18 minutes Total: Ankle (Right) - 18 minutes  Estimated Blood Loss: 5 cc Materials: * No implants in log * Medications: 12 cc 0.5% Marcaine plain. Complications: No complication noted  Indications for Procedure:  This is a 61 y.o. female with a history of type 2 diabetes who was admitted for DKA and noted to have a right foot infection.  Preop MRI revealed an abscess of the right foot.  Incision and drainage was recommended.  Informed consent was signed and reviewed prior to surgery.   Procedure in Detail: Patient was identified in pre-operative holding area. Formal consent was signed and the right lower extremity was marked. Patient was brought back to the operating room. Anesthesia was induced. The extremity was prepped and draped in the usual sterile fashion. Timeout was taken to confirm patient name, laterality, and procedure prior to incision.   Attention was then directed to the right foot which exhibited an area of necrosis and blistering on the plantar right midfoot.  An incision was made here and a small skin wedge was excised where there appeared to be a puncture wound.  Significant amount of malodorous purulent drainage emanated from the plantar midfoot.  A culture of this  was taken.  This penetrated throughout the plantar fat pad as well as deep to the plantar fascia.  There was some fat necrosis noted and small amounts of necrosis of the plantar fascia was noted as well.  Hemostat was used to create a counterincision on the medial arch which seem to be the most proximal extent of the abscess.  I debrided the nonviable tissues using a scalpel and rongeur.  Once thorough debridement was completed the wound was thoroughly irrigated with 3 L normal sterile saline throughout the incisions.  500 mg of vancomycin powder was then instilled into the wound.  Iodoform packing was placed through the counterincision and into the main portion of the abscess to pack it open.  Partial closure was completed with 2-0 nylon.  The foot was then dressed with 4 x 4 ABD pad Kerlix and Ace wrap. Patient tolerated the procedure well.   Disposition: Following a period of post-operative monitoring, patient will be transferred to the floor.  Pending her clinical progress she may require further surgical intervention on the foot but hopefully we have control of the infection at this point.

## 2022-03-31 ENCOUNTER — Encounter (HOSPITAL_COMMUNITY): Payer: Self-pay | Admitting: Podiatry

## 2022-03-31 DIAGNOSIS — N390 Urinary tract infection, site not specified: Secondary | ICD-10-CM | POA: Diagnosis not present

## 2022-03-31 DIAGNOSIS — E1110 Type 2 diabetes mellitus with ketoacidosis without coma: Secondary | ICD-10-CM | POA: Diagnosis not present

## 2022-03-31 DIAGNOSIS — L02611 Cutaneous abscess of right foot: Secondary | ICD-10-CM | POA: Diagnosis not present

## 2022-03-31 DIAGNOSIS — F419 Anxiety disorder, unspecified: Secondary | ICD-10-CM | POA: Diagnosis not present

## 2022-03-31 DIAGNOSIS — Z87442 Personal history of urinary calculi: Secondary | ICD-10-CM

## 2022-03-31 LAB — CBC
HCT: 33 % — ABNORMAL LOW (ref 36.0–46.0)
Hemoglobin: 11.1 g/dL — ABNORMAL LOW (ref 12.0–15.0)
MCH: 30.2 pg (ref 26.0–34.0)
MCHC: 33.6 g/dL (ref 30.0–36.0)
MCV: 89.7 fL (ref 80.0–100.0)
Platelets: 185 10*3/uL (ref 150–400)
RBC: 3.68 MIL/uL — ABNORMAL LOW (ref 3.87–5.11)
RDW: 12.5 % (ref 11.5–15.5)
WBC: 14.5 10*3/uL — ABNORMAL HIGH (ref 4.0–10.5)
nRBC: 0 % (ref 0.0–0.2)

## 2022-03-31 LAB — BASIC METABOLIC PANEL
Anion gap: 7 (ref 5–15)
BUN: 7 mg/dL (ref 6–20)
CO2: 21 mmol/L — ABNORMAL LOW (ref 22–32)
Calcium: 8.5 mg/dL — ABNORMAL LOW (ref 8.9–10.3)
Chloride: 109 mmol/L (ref 98–111)
Creatinine, Ser: 0.49 mg/dL (ref 0.44–1.00)
GFR, Estimated: >60 mL/min (ref >60)
Glucose, Bld: 248 mg/dL — ABNORMAL HIGH (ref 70–99)
Potassium: 3.3 mmol/L — ABNORMAL LOW (ref 3.5–5.1)
Sodium: 137 mmol/L (ref 135–145)

## 2022-03-31 LAB — GLUCOSE, CAPILLARY
Glucose-Capillary: 278 mg/dL — ABNORMAL HIGH (ref 70–99)
Glucose-Capillary: 215 mg/dL — ABNORMAL HIGH (ref 70–99)
Glucose-Capillary: 181 mg/dL — ABNORMAL HIGH (ref 70–99)
Glucose-Capillary: 179 mg/dL — ABNORMAL HIGH (ref 70–99)

## 2022-03-31 LAB — MAGNESIUM: Magnesium: 1.8 mg/dL (ref 1.7–2.4)

## 2022-03-31 MED ORDER — LOPERAMIDE HCL 2 MG PO CAPS
2.0000 mg | ORAL_CAPSULE | Freq: Once | ORAL | Status: AC
  Administered 2022-03-31: 2 mg via ORAL
  Filled 2022-03-31: qty 1

## 2022-03-31 MED ORDER — POTASSIUM CHLORIDE 10 MEQ/100ML IV SOLN
10.0000 meq | INTRAVENOUS | Status: AC
  Administered 2022-03-31 (×2): 10 meq via INTRAVENOUS
  Filled 2022-03-31 (×2): qty 100

## 2022-03-31 MED ORDER — POTASSIUM CHLORIDE CRYS ER 20 MEQ PO TBCR
40.0000 meq | EXTENDED_RELEASE_TABLET | Freq: Once | ORAL | Status: AC
  Administered 2022-03-31: 40 meq via ORAL
  Filled 2022-03-31: qty 2

## 2022-03-31 MED ORDER — VANCOMYCIN HCL 750 MG/150ML IV SOLN
750.0000 mg | Freq: Two times a day (BID) | INTRAVENOUS | Status: DC
  Administered 2022-03-31 – 2022-04-01 (×2): 750 mg via INTRAVENOUS
  Filled 2022-03-31 (×2): qty 150

## 2022-03-31 MED ORDER — INSULIN GLARGINE-YFGN 100 UNIT/ML ~~LOC~~ SOLN
35.0000 [IU] | Freq: Every day | SUBCUTANEOUS | Status: DC
Start: 2022-03-31 — End: 2022-04-01
  Administered 2022-03-31: 35 [IU] via SUBCUTANEOUS
  Filled 2022-03-31 (×2): qty 0.35

## 2022-03-31 MED ORDER — GLUCERNA SHAKE PO LIQD
237.0000 mL | Freq: Two times a day (BID) | ORAL | Status: DC
  Administered 2022-03-31 – 2022-04-03 (×2): 237 mL via ORAL
  Filled 2022-03-31 (×11): qty 237

## 2022-03-31 MED ORDER — ADULT MULTIVITAMIN W/MINERALS CH
1.0000 | ORAL_TABLET | Freq: Every day | ORAL | Status: DC
  Administered 2022-03-31 – 2022-04-05 (×5): 1 via ORAL
  Filled 2022-03-31 (×5): qty 1

## 2022-03-31 NOTE — Progress Notes (Signed)
Initial Nutrition Assessment RD working remotely.   DOCUMENTATION CODES:   Not applicable  INTERVENTION:  - will order Glucerna Shake BID, each supplement provides 220 kcal and 10 grams of protein.  - will order 1 tablet multivitamin with minerals/day.  - allow double protein portions at all meals.   - entered Carbohydrate Counting for People with Diabetes handout in AVS.    NUTRITION DIAGNOSIS:   Increased nutrient needs related to acute illness, wound healing as evidenced by estimated needs.  GOAL:   Patient will meet greater than or equal to 90% of their needs  MONITOR:   PO intake, Supplement acceptance, Labs, Weight trends, Skin  REASON FOR ASSESSMENT:   Consult Wound healing  ASSESSMENT:   61 year old female with medical history of anxiety, depression, type 2 DM, HLD, and HTN. She presented to the ED due to RLQ abdominal pain and fatigue x1 week. In the ED, UA was consistent with UTI and CT abdomen/pelvis showed non-obstructive L-sided nephrolithiasis and hepatic steatosis. She was noted to have puncture wound to R foot and she was in DKA.  Diet advanced from NPO to Carb Modified yesterday at 0754 and no meal intakes yet documented.   She has not been seen by a [place] RD at any time in the past.  She is POD #1 I&D of multiple fascial planes of R foot.   Weight today is 200 lb and PTA the most recently documented weight was 179 lb on 07/10/21. Mild pitting edema to RLE documented in the edema section of flow sheet.   Labs reviewed; CBGs: 278 and 215 mg/dl, K: 3.3 mmol/l, Ca: 8.5 mg/dl, Phos: 1 mg/dl on 7/29.  Medications reviewed; sliding scale novolog, 8 units novolog x1 dose 7/29, 35 units semglee/day, 40 mEq Klor-Con x1 dose 7/30, 30 mmol IV KPhos x1 run 7/29.    NUTRITION - FOCUSED PHYSICAL EXAM:  RD working remotely.   Diet Order:   Diet Order             Diet Carb Modified Fluid consistency: Thin; Room service appropriate? Yes  Diet  effective now                   EDUCATION NEEDS:   Education needs have been addressed  Skin:  Skin Assessment: Skin Integrity Issues: Skin Integrity Issues:: Incisions Incisions: R heel + R foot  Last BM:  7/30 (type 6 x1, medium amount)  Height:   Ht Readings from Last 1 Encounters:  03/31/22 '5\' 7"'$  (1.702 m)    Weight:   Wt Readings from Last 1 Encounters:  03/31/22 90.9 kg    BMI:  Body mass index is 31.39 kg/m.  Estimated Nutritional Needs:  Kcal:  2020-2275 kcal Protein:  100-115 grams Fluid:  >/= 2.2 L/day      Jarome Matin, MS, RD, LDN, CNSC Registered Dietitian II Inpatient Clinical Nutrition RD pager # and on-call/weekend pager # available in [facility]

## 2022-03-31 NOTE — Progress Notes (Signed)
PROGRESS NOTE    LEGEND PECORE  BZJ:[id number] DOB: Sep 05, 1960 DOA: 03/29/2022 PCP: Pcp, No   Brief Narrative:  61 year old with history of anxiety, depression, DM 2, HLD, HTN, lithiasis comes to the hospital with complaints of right lower quadrant abdominal pain and CVA tenderness on the right side ongoing for the past week along with fatigue.  UA was consistent with glucosuria and urinary tract infection.  CT abdomen pelvis showed nonobstructive left-sided nephrolithiasis and hepatic steatosis.  She was found to have puncture wound of her right foot and was in diabetic ketoacidosis. S/p I&D right foot now dressing changes Podiatry.    Assessment & Plan:  Principal Problem:   DKA (diabetic ketoacidosis) ([place]) Active Problems:   Diabetic foot infection ([place])   Hyperlipidemia   Hypertension   History of kidney stones   Anxiety and depression   Hypercalcemia   Puncture wound of plantar aspect of foot   Acute UTI (urinary tract infection)   Abscess of right foot    DKA (diabetic ketoacidosis) ([place]) - resolved.  Insulin-dependent diabetes mellitus type 2 -Hemoglobin A1c 10.3.  DKA protocol stopped.  Anion gap closed this morning.  Adjust Semglee + ISS - Consulted diabetic coordinator   Sepsis secondary to puncture wound of plantar aspect of right foot Cellulitis of the right foot with loculated abscess Status post I&D of the right foot Sepsis improving. - Empiric antibiotics-IV vancomycin, Flagyl and cefepime. Follow up cultures.  -MRI shows small loculated abscess -Seen by podiatry     Hyperlipidemia May benefit from statin therapy.     Hypertension Pain control. Consider starting ACE inhibitor if BP elevated.     Acute UTI (urinary tract infection)   History of kidney stones.  Left-sided nonobstructive stone On IV cefepime.  IV fluids follow-up cultures     Anxiety and depression Ambien at bedtime while in the hospital. Follow-up with [facility] and/or PCP.      Hypercalcemia Likely from dehydration.  Resolved this morning  Hypokalemia/hypophosphatemia - Repletion as needed     DVT prophylaxis: enoxaparin (LOVENOX) injection 40 mg Start: 03/29/22 1800 Code Status: Full code Family Communication:    On IV Abx, cont hosp stay  Subjective: Feels ok, still has hyperglycemia.   Examination:  Constitutional: Not in acute distress Respiratory: Clear to auscultation bilaterally Cardiovascular: Normal sinus rhythm, no rubs Abdomen: Nontender nondistended good bowel sounds Musculoskeletal: No edema noted Skin: RLE dressing in place.  Neurologic: CN 2-12 grossly intact.  And nonfocal Psychiatric: Normal judgment and insight. Alert and oriented x 3. Normal mood.     Objective: Vitals:   03/31/22 0027 03/31/22 0337 03/31/22 0629 03/31/22 1132  BP:   (!) 131/92   Pulse:   99   Resp:   18   Temp: 100.1 F (37.8 C) 98.9 F (37.2 C) 99.1 F (37.3 C)   TempSrc: Oral Oral Oral   SpO2:   96%   Weight:   90.9 kg   Height:    '5\' 7"'$  (1.702 m)    Intake/Output Summary (Last 24 hours) at 03/31/2022 1212 Last data filed at 03/31/2022 0935 Gross per 24 hour  Intake 1431.64 ml  Output --  Net 1431.64 ml   Filed Weights   03/29/22 0614 03/31/22 0629  Weight: 81.2 kg 90.9 kg     Data Reviewed:   CBC: Recent Labs  Lab 03/29/22 0626 03/29/22 0656 03/30/22 0259 03/31/22 0246  WBC 20.3*  --  14.6* 14.5*  NEUTROABS 16.9*  --   --   --  HGB 14.0 13.6 11.0* 11.1*  HCT 40.6 40.0 32.9* 33.0*  MCV 89.2  --  89.2 89.7  PLT 244  --  205 947   Basic Metabolic Panel: Recent Labs  Lab 03/29/22 0626 03/29/22 0656 03/29/22 1408 03/30/22 0259 03/30/22 1204 03/31/22 0246  NA 132* 131* 135 140  --  137  K 4.5 5.3* 2.9* 3.0*  --  3.3*  CL 96*  --  106 111  --  109  CO2 15*  --  19* 22  --  21*  GLUCOSE 361*  --  215* 188*  --  248*  BUN 15  --  10 9  --  7  CREATININE 0.68  --  0.50 0.37*  --  0.49  CALCIUM 10.4*  --  9.2 8.7*  --  8.5*   MG  --   --   --  1.7  --  1.8  PHOS  --   --   --  1.3* 1.0*  --    GFR: Estimated Creatinine Clearance: 86.5 mL/min (by C-G formula based on SCr of 0.49 mg/dL). Liver Function Tests: Recent Labs  Lab 03/29/22 0626 03/30/22 0259  AST 16 11*  ALT 16 14  ALKPHOS 76 61  BILITOT 0.8 0.6  PROT 7.5 5.7*  ALBUMIN 4.3 2.6*   Recent Labs  Lab 03/29/22 0626  LIPASE 16   No results for input(s): "AMMONIA" in the last 168 hours. Coagulation Profile: No results for input(s): "INR", "PROTIME" in the last 168 hours. Cardiac Enzymes: No results for input(s): "CKTOTAL", "CKMB", "CKMBINDEX", "TROPONINI" in the last 168 hours. BNP (last 3 results) No results for input(s): "PROBNP" in the last 8760 hours. HbA1C: Recent Labs    03/29/22 1403  HGBA1C 10.3*   CBG: Recent Labs  Lab 03/30/22 1138 03/30/22 1629 03/30/22 2045 03/30/22 2251 03/31/22 0729  GLUCAP 167* 244* 277* 246* 278*   Lipid Profile: No results for input(s): "CHOL", "HDL", "LDLCALC", "TRIG", "CHOLHDL", "LDLDIRECT" in the last 72 hours. Thyroid Function Tests: No results for input(s): "TSH", "T4TOTAL", "FREET4", "T3FREE", "THYROIDAB" in the last 72 hours. Anemia Panel: No results for input(s): "VITAMINB12", "FOLATE", "FERRITIN", "TIBC", "IRON", "RETICCTPCT" in the last 72 hours. Sepsis Labs: No results for input(s): "PROCALCITON", "LATICACIDVEN" in the last 168 hours.  Recent Results (from the past 240 hour(s))  Urine Culture     Status: Abnormal   Collection Time: 03/29/22  8:35 AM   Specimen: Urine, Clean Catch  Result Value Ref Range Status   Specimen Description   Final    URINE, CLEAN CATCH Performed at [facility], [address], [place], [place] [address]    Special Requests   Final    NONE Performed at [facility], [address] [place], [place] [address]    Culture MULTIPLE SPECIES PRESENT, SUGGEST RECOLLECTION (A)  Final   Report Status 03/30/2022  FINAL  Final  MRSA Next Gen by PCR, Nasal     Status: None   Collection Time: 03/29/22  2:04 PM   Specimen: Nasal Mucosa; Nasal Swab  Result Value Ref Range Status   MRSA by PCR Next Gen NOT DETECTED NOT DETECTED Final    Comment: (NOTE) The GeneXpert MRSA Assay (FDA approved for NASAL specimens only), is one component of a comprehensive MRSA colonization surveillance program. It is not intended to diagnose MRSA infection nor to guide or monitor treatment for MRSA infections. Test performance is not FDA approved in patients less than 93 years old. Performed  at [facility], [place] [address], [place], [place] [address]   Aerobic/Anaerobic Culture w Gram Stain (surgical/deep wound)     Status: None (Preliminary result)   Collection Time: 03/30/22  8:37 AM   Specimen: PATH Other; Tissue  Result Value Ref Range Status   Specimen Description   Final    TISSUE Performed at [facility] [address] [place], [place] [address]    Special Requests   Final    TISSUE Performed at [facility], [place] [address], [place], [place] [address]    Gram Stain   Final    NO SQUAMOUS EPITHELIAL CELLS SEEN FEW WBC SEEN MODERATE GRAM POSITIVE COCCI MODERATE GRAM NEGATIVE RODS    Culture   Final    CULTURE REINCUBATED FOR BETTER GROWTH Performed at [facility], [place] [address], [place], [place] [address]    Report Status PENDING  Incomplete         Radiology Studies: MR FOOT RIGHT W WO CONTRAST  Result Date: 03/29/2022 CLINICAL DATA:  Foot swelling, diabetic, osteomyelitis suspected. EXAM: MRI OF THE RIGHT FOREFOOT WITHOUT AND WITH CONTRAST TECHNIQUE: Multiplanar, multisequence MR imaging of the right forefoot was performed before and after the administration of intravenous contrast. CONTRAST:  46m GADAVIST GADOBUTROL 1 MMOL/ML IV SOLN COMPARISON:  None Available. FINDINGS: Bones/Joint/Cartilage Marrow signal is within normal  limits. No evidence of osteomyelitis. No evidence of fracture or significant arthropathy. Ligaments Lisfranc and collateral ligaments are intact. 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 Flexor and extensor tendons appear intact. Soft tissues There is a peripherally enhancing loculated fluid collection about the plantar aspect of the foot at the level of the fourth metatarsal measuring approximately 0.9 x 1.1 x 3.7 cm. Generalized subcutaneous soft tissue edema about the dorsum and plantar aspect of the foot. There is skin irregularity about the plantar aspect of the foot about the level of the above-mentioned abscess concerning for deep skin wound, correlate with physical examination findings. IMPRESSION: 1.  No evidence of osteomyelitis. 2. Marked skin thickening and subcutaneous soft tissue edema about the plantar and dorsal aspect of the foot. 3. Peripherally enhancing loculated fluid collection about the plantar aspect of the foot at the level of the fourth metatarsal measuring approximately 0.9 x 1.1 x 3.7 cm most consistent with an abscess. There is a skin irregularity about the above-mentioned collection, likely a skin wound. Correlate with physical examination findings. Electronically Signed   By: IKeane PoliceD.O.   On: 03/29/2022 22:20   DG Foot 2 Views Right  Result Date: 03/29/2022 CLINICAL DATA:  Infected puncture wound in the plantar right foot. EXAM: RIGHT FOOT - 2 VIEW COMPARISON:  Right foot radiograph dated 01/12/2009. FINDINGS: There is no acute fracture or dislocation. The bones are mildly osteopenic. No bone erosion or periosteal elevation. There is soft tissue swelling of the forefoot. Subcutaneous edema or tiny pocket of air in the soft tissues of the plantar forefoot. No radiopaque foreign object. IMPRESSION: 1. No acute fracture or dislocation. No evidence of acute osteomyelitis by radiograph. 2. Soft  tissue swelling of the forefoot and possible tiny pockets of gas in the soft tissues of the plantar forefoot. No radiopaque foreign object. Electronically Signed   By: AAnner CreteM.D.   On: 03/29/2022 19:35        Scheduled Meds:  Chlorhexidine Gluconate Cloth  6 each Topical Q0600   enoxaparin (LOVENOX)  injection  40 mg Subcutaneous Q24H   insulin aspart  0-15 Units Subcutaneous TID WC   insulin glargine-yfgn  35 Units Subcutaneous Daily   Continuous Infusions:  sodium chloride Stopped (03/31/22 0425)   sodium chloride     ceFEPime (MAXIPIME) IV 2 g (03/31/22 1124)   metronidazole 500 mg (03/31/22 0620)   vancomycin       LOS: 1 day   Time spent= 35 mins    Montrelle Eddings Arsenio Loader, MD [facility]  If 7PM-7AM, please contact night-coverage  03/31/2022, 12:12 PM

## 2022-03-31 NOTE — Progress Notes (Signed)
Pharmacy Antibiotic Note  Courtney Parks is a 61 y.o. female admitted on 03/29/2022 with  diabetic foot infection .  Pharmacy has been consulted for Vancomycin and Cefepime dosing.  R foot x-ray on 7/28 negative for osteomyelitis; showed soft tissue swelling and possible tiny pockets of gas.  Patient underwent I&D of the R foot on 7/29 by podiatry.   Plan: Adjust vancomycin to '750mg'$  IV q12 hours (eAUC 509, Scr 0.8, Vd 0.5) Continue cefepime 2g IV q8hr Continue metronidazole '500mg'$  IV q12 hours per MD F/u wound culture and narrow antibiotics as able  Height: '5\' 7"'$  (170.2 cm) Weight: 90.9 kg (200 lb 6.4 oz) IBW/kg (Calculated) : 61.6  Temp (24hrs), Avg:99.2 F (37.3 C), Min:98.3 F (36.8 C), Max:100.1 F (37.8 C)  Recent Labs  Lab 03/29/22 0626 03/29/22 1408 03/30/22 0259 03/31/22 0246  WBC 20.3*  --  14.6* 14.5*  CREATININE 0.68 0.50 0.37* 0.49     Estimated Creatinine Clearance: 86.5 mL/min (by C-G formula based on SCr of 0.49 mg/dL).    Allergies  Allergen Reactions   Codeine     REACTION: Nausea   Demerol Nausea And Vomiting   Other Other (See Comments)    Narcotics- drops her blood pressure   Shellfish Allergy Swelling   Antimicrobials this admission:  Vancomycin 7/28 >>  Cefepime 7/28 >>  Flagyl 7/28 >>  Dose adjustments this admission:  7/30 - weight inc'd today from 81kg to 90kg (RN re-weighed pt and confirmed 90kg is correct) > adjust vanc to '750mg'$  IV q12h  Microbiology results:  7/29 Tissue Cx: moderate gram positive cocci, moderate gram negative rods 7/28 UCx: multiple species   Dimple Nanas, PharmD, BCPS 03/31/2022 11:47 AM

## 2022-03-31 NOTE — Progress Notes (Signed)
  Subjective:  Patient ID: [name], female    DOB: 1960/11/28,  MRN: [id number]  Seen at bedside status post I&D of right foot POD #1.  She is not having much pain in the foot.  Still having issues with nausea and now having loose stools as well.  Son is here at bedside as well.  Negative for chest pain and shortness of breath Chest pain: no Shortness of breath: no Fever: no Review of all other systems is negative Objective:   Vitals:   03/31/22 0337 03/31/22 0629  BP:  (!) 131/92  Pulse:  99  Resp:  18  Temp: 98.9 F (37.2 C) 99.1 F (37.3 C)  SpO2:  96%   General AA&O x3. Normal mood and affect.  Vascular Dorsalis pedis and posterior tibial pulses 2/4 bilat. Brisk capillary refill to all digits. Pedal hair present.  Neurologic Epicritic sensation grossly intact.  Dermatologic No new purulence.  Serous drainage.  Slight malodor to packing.  Initial area of bruising from abscess is beginning to become necrotic.  Cellulitis does seem improved, edema seems improved  Orthopedic: MMT 5/5        Culture with GPC's and GNR's   Assessment & Plan:  Patient was evaluated and treated and all questions answered.  Severe diabetic foot infection right foot POD #1 status post I&D of right foot -Continue broad-spectrum antibiotics for now.  Should be able to isolate and narrow once speciation and sensitivities are returned -Dressing was changed.  Recommend twice daily changes with packing for now. -We will have to closely monitor area of central necrosis, this may develop and ulceration may require further debridement and soft tissue reconstruction.  [name], DPM  Accessible via secure chat for questions or concerns.

## 2022-03-31 NOTE — Evaluation (Signed)
Physical Therapy Evaluation Patient Details Name: Courtney Parks MRN: [id number] DOB: 09/07/1960 Today's Date: 03/31/2022  History of Present Illness  61 year old with history of anxiety, depression, DM 2, HLD, HTN, lithiasis comes to the hospital with complaints of right lower quadrant abdominal pain and CVA tenderness on the right side ongoing for the past week along with fatigue. CT abdomen pelvis showed nonobstructive left-sided nephrolithiasis and hepatic steatosis.  She was found to have puncture wound of her right foot and was in diabetic ketoacidosis. S/p I&D right foot now with post op shoe recommended.  Clinical Impression  Pt admitted as above and presenting with functional mobility limitations 2* decreased endurance and ambulatory balance deficits.  This date, pt ambulated limited distance in hall with RW and one episode of steady assist for near balance loss.  Pt demonstrating good awareness of heel WB on R with post op shoe in place and should progress to dc home with family assist..  Pt would benefit from RW for home use.     Recommendations for follow up therapy are one component of a multi-disciplinary discharge planning process, led by the attending physician.  Recommendations may be updated based on patient status, additional functional criteria and insurance authorization.  Follow Up Recommendations No PT follow up      Assistance Recommended at Discharge PRN  Patient can return home with the following  A little help with walking and/or transfers;Assist for transportation;Help with stairs or ramp for entrance;Assistance with cooking/housework    Equipment Recommendations Rolling walker (2 wheels)  Recommendations for Other Services       Functional Status Assessment Patient has had a recent decline in their functional status and demonstrates the ability to make significant improvements in function in a reasonable and predictable amount of time.      Precautions / Restrictions Precautions Precautions: None Restrictions Weight Bearing Restrictions: No Other Position/Activity Restrictions: NO WB orders placed, pt states she is WB on heel only and only with post op shoe in place      Mobility  Bed Mobility Overal bed mobility: Modified Independent                  Transfers Overall transfer level: Modified independent Equipment used: Rolling walker (2 wheels)                    Ambulation/Gait Ambulation/Gait assistance: Min guard, Supervision Gait Distance (Feet): 60 Feet Assistive device: Rolling walker (2 wheels) Gait Pattern/deviations: Step-to pattern, Decreased step length - right, Decreased step length - left, Shuffle       General Gait Details: cues for posture, sequence and postion from RW;  Pt with good awareness and with noted heel walking only on R LE with post op shoe in place.  One episode mild balance loss  Stairs            Wheelchair Mobility    Modified Rankin (Stroke Patients Only)       Balance Overall balance assessment: Mild deficits observed, not formally tested                                           Pertinent Vitals/Pain Pain Assessment Pain Assessment: No/denies pain    Home Living Family/patient expects to be discharged to:: Private residence Living Arrangements: Alone Available Help at Discharge: Family Type of Home: House Home Access: Stairs to  enter Entrance Stairs-Rails: Right;Left;Can reach both Entrance Stairs-Number of Steps: 2   Home Layout: One level Home Equipment: BSC/3in1 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 Dominance   Dominant Hand: Right    Extremity/Trunk Assessment   Upper Extremity Assessment Upper Extremity Assessment: Defer to OT evaluation    Lower Extremity Assessment Lower Extremity Assessment:  RLE deficits/detail;LLE deficits/detail RLE Deficits / Details: dressings and post op shoe in place R foot RLE Sensation: history of peripheral neuropathy LLE Sensation: history of peripheral neuropathy    Cervical / Trunk Assessment Cervical / Trunk Assessment: Normal  Communication   Communication: No difficulties  Cognition Arousal/Alertness: Awake/alert Behavior During Therapy: WFL for tasks assessed/performed Overall Cognitive Status: Within Functional Limits for tasks assessed                                          General Comments      Exercises     Assessment/Plan    PT Assessment Patient needs continued PT services  PT Problem List Decreased activity tolerance;Decreased balance;Decreased mobility;Decreased knowledge of use of DME       PT Treatment Interventions DME instruction;Gait training;Stair training;Functional mobility training;Therapeutic activities;Therapeutic exercise;Balance training;Patient/family education    PT Goals (Current goals can be found in the Care Plan section)  Acute Rehab PT Goals Patient Stated Goal: Regain IND PT Goal Formulation: With patient Time For Goal Achievement: 04/14/22 Potential to Achieve Goals: Good    Frequency Min 3X/week     Co-evaluation               AM-PAC PT "6 Clicks" Mobility  Outcome Measure Help needed turning from your back to your side while in a flat bed without using bedrails?: None Help needed moving from lying on your back to sitting on the side of a flat bed without using bedrails?: None Help needed moving to and from a bed to a chair (including a wheelchair)?: None Help needed standing up from a chair using your arms (e.g., wheelchair or bedside chair)?: None Help needed to walk in hospital room?: A Little Help needed climbing 3-5 steps with a railing? : A Little 6 Click Score: 22    End of Session Equipment Utilized During Treatment: Gait belt Activity Tolerance:  Patient tolerated treatment well Patient left: in bed;with call bell/phone within reach;with bed alarm set Nurse Communication: Mobility status PT Visit Diagnosis: Unsteadiness on feet (R26.81)    Time: 8527-7824 PT Time Calculation (min) (ACUTE ONLY): 22 min   Charges:   PT Evaluation $PT Eval Low Complexity: 1 Low          [place] Pager [phone] Office [phone]   Naresh Althaus 03/31/2022, 5:09 PM

## 2022-03-31 NOTE — Evaluation (Signed)
Occupational Therapy Evaluation Patient Details Name: Courtney Parks MRN: [id number] DOB: 1961-07-30 Today's Date: 03/31/2022   History of Present Illness 61 year old with history of anxiety, depression, DM 2, HLD, HTN, lithiasis comes to the hospital with complaints of right lower quadrant abdominal pain and CVA tenderness on the right side ongoing for the past week along with fatigue. CT abdomen pelvis showed nonobstructive left-sided nephrolithiasis and hepatic steatosis.  She was found to have puncture wound of her right foot and was in diabetic ketoacidosis. S/p I&D right foot now with post op shoe recommended.   Clinical Impression   Courtney Parks is a 61 year old woman who now presents with post op shoe and no specific weight bearing restrictions. She is overall modified independent for ADLs and ambulation with a walker. No overt impairments. Has assistance of son and BSC if needed at home. No OT needs.       Recommendations for follow up therapy are one component of a multi-disciplinary discharge planning process, led by the attending physician.  Recommendations may be updated based on patient status, additional functional criteria and insurance authorization.   Follow Up Recommendations  No OT follow up    Assistance Recommended at Discharge None  Patient can return home with the following Assistance with cooking/housework    Functional Status Assessment  Patient has not had a recent decline in their functional status  Equipment Recommendations  None recommended by OT    Recommendations for Other Services       Precautions / Restrictions Precautions Precautions: None Restrictions Weight Bearing Restrictions: No      Mobility Bed Mobility Overal bed mobility: Modified Independent                  Transfers Overall transfer level: Modified independent Equipment used: Rolling walker (2 wheels)                      Balance  Overall balance assessment: Mild deficits observed, not formally tested                                         ADL either performed or assessed with clinical judgement   ADL Overall ADL's : Modified independent                                             Vision   Vision Assessment?: No apparent visual deficits     Perception     Praxis      Pertinent Vitals/Pain Pain Assessment Pain Assessment: No/denies pain     Hand Dominance Right   Extremity/Trunk Assessment Upper Extremity Assessment Upper Extremity Assessment: Overall WFL for tasks assessed   Lower Extremity Assessment Lower Extremity Assessment: Defer to PT evaluation   Cervical / Trunk Assessment Cervical / Trunk Assessment: Normal   Communication Communication Communication: No difficulties   Cognition Arousal/Alertness: Awake/alert Behavior During Therapy: WFL for tasks assessed/performed Overall Cognitive Status: Within Functional Limits for tasks assessed                                       General Comments       Exercises  Shoulder Instructions      Home Living Family/patient expects to be discharged to:: Private residence Living Arrangements: Alone Available Help at Discharge: Family Type of Home: House Home Access: Stairs to enter Technical brewer of Steps: 2 Entrance Stairs-Rails: Right;Left;Can reach both Home Layout: One level     Bathroom Shower/Tub: Teacher, early years/pre: [place]: BSC/3in1   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 Prior Level of Function : Independent/Modified Independent                        OT Problem List: Pain      OT Treatment/Interventions:      OT Goals(Current goals can be found in the care plan section) Acute Rehab OT Goals OT Goal Formulation: All assessment and education complete, DC therapy  OT  Frequency:      Co-evaluation              AM-PAC OT "6 Clicks" Daily Activity     Outcome Measure Help from another person eating meals?: None Help from another person taking care of personal grooming?: None Help from another person toileting, which includes using toliet, bedpan, or urinal?: None Help from another person bathing (including washing, rinsing, drying)?: None Help from another person to put on and taking off regular upper body clothing?: None Help from another person to put on and taking off regular lower body clothing?: None 6 Click Score: 24   End of Session Equipment Utilized During Treatment: Rolling walker (2 wheels) Nurse Communication: Mobility status  Activity Tolerance: Patient tolerated treatment well Patient left: in bed;with call bell/phone within reach  OT Visit Diagnosis: Pain                Time: 1357-1410 OT Time Calculation (min): 13 min Charges:  OT General Charges $OT Visit: 1 Visit OT Evaluation $OT Eval Low Complexity: 1 Low  Jaclyne Haverstick, OTR/L [place]  Office [phone] Pager: [place] 03/31/2022, 4:40 PM

## 2022-03-31 NOTE — Progress Notes (Signed)
Orthopedic Tech Progress Note Patient Details:  Courtney Parks September 10, 1960 [id number]  Patient ID: [name], female   DOB: September 15, 1960, 61 y.o.   MRN: [id number]  [name] 03/31/2022, 11:04 AM Post op shoe reapplied bc old was soiled. New one required.

## 2022-04-01 ENCOUNTER — Inpatient Hospital Stay (HOSPITAL_COMMUNITY): Payer: BC Managed Care – PPO

## 2022-04-01 DIAGNOSIS — L0390 Cellulitis, unspecified: Secondary | ICD-10-CM

## 2022-04-01 DIAGNOSIS — E1110 Type 2 diabetes mellitus with ketoacidosis without coma: Secondary | ICD-10-CM | POA: Diagnosis not present

## 2022-04-01 DIAGNOSIS — E11628 Type 2 diabetes mellitus with other skin complications: Secondary | ICD-10-CM | POA: Diagnosis not present

## 2022-04-01 DIAGNOSIS — F419 Anxiety disorder, unspecified: Secondary | ICD-10-CM | POA: Diagnosis not present

## 2022-04-01 DIAGNOSIS — E785 Hyperlipidemia, unspecified: Secondary | ICD-10-CM

## 2022-04-01 DIAGNOSIS — Z794 Long term (current) use of insulin: Secondary | ICD-10-CM

## 2022-04-01 DIAGNOSIS — S91331A Puncture wound without foreign body, right foot, initial encounter: Secondary | ICD-10-CM | POA: Diagnosis not present

## 2022-04-01 DIAGNOSIS — N390 Urinary tract infection, site not specified: Secondary | ICD-10-CM | POA: Diagnosis not present

## 2022-04-01 DIAGNOSIS — L02611 Cutaneous abscess of right foot: Secondary | ICD-10-CM | POA: Diagnosis not present

## 2022-04-01 DIAGNOSIS — A401 Sepsis due to streptococcus, group B: Secondary | ICD-10-CM

## 2022-04-01 LAB — BASIC METABOLIC PANEL
Anion gap: 8 (ref 5–15)
BUN: 8 mg/dL (ref 6–20)
CO2: 21 mmol/L — ABNORMAL LOW (ref 22–32)
Calcium: 8.5 mg/dL — ABNORMAL LOW (ref 8.9–10.3)
Chloride: 107 mmol/L (ref 98–111)
Creatinine, Ser: 0.37 mg/dL — ABNORMAL LOW (ref 0.44–1.00)
GFR, Estimated: >60 mL/min (ref >60)
Glucose, Bld: 197 mg/dL — ABNORMAL HIGH (ref 70–99)
Potassium: 3.6 mmol/L (ref 3.5–5.1)
Sodium: 136 mmol/L (ref 135–145)

## 2022-04-01 LAB — CBC
HCT: 36 % (ref 36.0–46.0)
Hemoglobin: 11.7 g/dL — ABNORMAL LOW (ref 12.0–15.0)
MCH: 30 pg (ref 26.0–34.0)
MCHC: 32.5 g/dL (ref 30.0–36.0)
MCV: 92.3 fL (ref 80.0–100.0)
Platelets: 218 10*3/uL (ref 150–400)
RBC: 3.9 MIL/uL (ref 3.87–5.11)
RDW: 13 % (ref 11.5–15.5)
WBC: 14 10*3/uL — ABNORMAL HIGH (ref 4.0–10.5)
nRBC: 0 % (ref 0.0–0.2)

## 2022-04-01 LAB — GLUCOSE, CAPILLARY
Glucose-Capillary: 193 mg/dL — ABNORMAL HIGH (ref 70–99)
Glucose-Capillary: 220 mg/dL — ABNORMAL HIGH (ref 70–99)
Glucose-Capillary: 189 mg/dL — ABNORMAL HIGH (ref 70–99)
Glucose-Capillary: 119 mg/dL — ABNORMAL HIGH (ref 70–99)

## 2022-04-01 LAB — MAGNESIUM: Magnesium: 1.9 mg/dL (ref 1.7–2.4)

## 2022-04-01 LAB — PHOSPHORUS: Phosphorus: 2.5 mg/dL (ref 2.5–4.6)

## 2022-04-01 MED ORDER — GERHARDT'S BUTT CREAM
TOPICAL_CREAM | Freq: Two times a day (BID) | CUTANEOUS | Status: DC
  Administered 2022-04-03: 1 via TOPICAL
  Filled 2022-04-01 (×2): qty 1

## 2022-04-01 MED ORDER — INSULIN GLARGINE-YFGN 100 UNIT/ML ~~LOC~~ SOLN
42.0000 [IU] | Freq: Every day | SUBCUTANEOUS | Status: DC
Start: 2022-04-01 — End: 2022-04-04
  Administered 2022-04-01 – 2022-04-02 (×2): 42 [IU] via SUBCUTANEOUS
  Filled 2022-04-01 (×4): qty 0.42

## 2022-04-01 MED ORDER — METRONIDAZOLE 500 MG PO TABS
500.0000 mg | ORAL_TABLET | Freq: Two times a day (BID) | ORAL | Status: DC
  Administered 2022-04-01 – 2022-04-02 (×2): 500 mg via ORAL
  Filled 2022-04-01 (×2): qty 1

## 2022-04-01 MED ORDER — LOPERAMIDE HCL 2 MG PO CAPS
4.0000 mg | ORAL_CAPSULE | ORAL | Status: DC | PRN
Start: 2022-04-01 — End: 2022-04-05
  Administered 2022-04-04 (×2): 4 mg via ORAL
  Filled 2022-04-01 (×2): qty 2

## 2022-04-01 MED ORDER — SODIUM CHLORIDE 0.9 % IV SOLN
2.0000 g | INTRAVENOUS | Status: DC
  Administered 2022-04-01 – 2022-04-02 (×2): 2 g via INTRAVENOUS
  Filled 2022-04-01 (×2): qty 20

## 2022-04-01 NOTE — Progress Notes (Signed)
   04/01/22 1707  Assess: MEWS Score  Temp (!) 102.7 F (39.3 C) (nurse notified)  Assess: MEWS Score  MEWS Temp 2  MEWS Systolic 0  MEWS Pulse 0  MEWS RR 0  MEWS LOC 0  MEWS Score 2  MEWS Score Color Yellow  Assess: if the MEWS score is Yellow or Red  Were vital signs taken at a resting state? Yes  Focused Assessment Change from prior assessment (see assessment flowsheet)  Treat  Pain Scale 0-10  Pain Score 4  Pain Type Acute pain  Pain Location Foot  Pain Orientation Right  Pain Descriptors / Indicators Aching  Pain Frequency Constant  Pain Intervention(s) Medication (See eMAR)  Take Vital Signs  Increase Vital Sign Frequency  Yellow: Q 2hr X 2 then Q 4hr X 2, if remains yellow, continue Q 4hrs  Escalate  MEWS: Escalate Yellow: discuss with charge nurse/RN and consider discussing with provider and RRT  Notify: Charge Nurse/RN  Name of Charge Nurse/RN Notified Mardi Mainland RN  Date Charge Nurse/RN Notified 04/01/22  Time Charge Nurse/RN Notified 3143  Notify: Provider  Provider Name/Title Dr.Ankit Reesa Chew  Date Provider Notified 04/01/22  Time Provider Notified 1713  Method of Notification Page (chat room)  Notification Reason Other (Comment) (Temp-102.7)  Provider response See new orders  Date of Provider Response 04/01/22  Time of Provider Response 1714  Document  Patient Outcome Stabilized after interventions  Assess: SIRS CRITERIA  SIRS Temperature  1  SIRS Pulse 1  SIRS Respirations  0  SIRS WBC 1  SIRS Score Sum  3

## 2022-04-01 NOTE — Inpatient Diabetes Management (Signed)
Inpatient Diabetes Program Recommendations  AACE/ADA: New Consensus Statement on Inpatient Glycemic Control (2015)  Target Ranges:  Prepandial:   less than 140 mg/dL      Peak postprandial:   less than 180 mg/dL (1-2 hours)      Critically ill patients:  140 - 180 mg/dL   Lab Results  Component Value Date   GLUCAP 220 (H) 04/01/2022   HGBA1C 10.3 (H) 03/29/2022    Review of Glycemic Control  Inpatient Diabetes Program Recommendations:   Spoke with patient at bedside along with her brother. Libre sensor is working and patient asked differences in reading of blood sample from CBG compared with sensor. Discussed libre sensor measures glucose from interstitial fluid compared to blood sample and has 15 minute lag time. Discussed other questions regarding libre and nutrition with patient.  Thank you, Nani Gasser. Tava Peery, RN, MSN, CDE  Diabetes Coordinator Inpatient Glycemic Control Team Team Pager [phone] (8am-5pm) 04/01/2022 1:27 PM

## 2022-04-01 NOTE — Consult Note (Signed)
[facility] for Infectious Disease    Date of Admission:  03/29/2022     Reason for Consult: Right diabetic foot wound     Referring Physician: Dr Reesa Chew  Current antibiotics: Vancomycin Cefepime Flagyl  ASSESSMENT:    61 y.o. female admitted with:  Diabetic foot wound: Secondary to puncture wound to the plantar aspect of the right foot complicated by cellulitis and loculated abscess.  Status post incision and drainage of multiple fascial planes on 03/29/2022 by Dr. Sherryle Lis (podiatry).  Per operative note, there was purulent drainage emanating from the plantar midfoot that penetrated throughout the plantar fat pad as well as deep to the plantar fascia.  There did not appear to be any bone involvement and MRI was negative for evidence of osteomyelitis.  Operative cultures are polymicrobial with GPC and GNR on Gram stain with cultures thus far growing group B strep.  She may require further debridement per podiatry for area of ongoing central necrosis. Sepsis: Secondary to #1 and improved. Uncontrolled type 2 diabetes: Hemoglobin A1c 10.3 after she presented in DKA with active infection. History of kidney stones  RECOMMENDATIONS:    Stop vancomycin, cefepime Start ceftriaxone Continue metronidazole Follow cultures Await ABI to ensure adequate blood flow Follow-up further podiatry plans for repeat debridement Wound care, glycemic control, lab monitoring Will follow   Principal Problem:   DKA (diabetic ketoacidosis) ([place]) Active Problems:   Diabetic foot infection ([place])   Hyperlipidemia   Hypertension   History of kidney stones   Anxiety and depression   Hypercalcemia   Puncture wound of plantar aspect of foot   Acute UTI (urinary tract infection)   Abscess of right foot   MEDICATIONS:    Scheduled Meds: . Chlorhexidine Gluconate Cloth  6 each Topical Q0600  . enoxaparin (LOVENOX) injection  40 mg Subcutaneous Q24H  . feeding supplement (GLUCERNA SHAKE)  237 mL  Oral BID BM  . insulin aspart  0-15 Units Subcutaneous TID WC  . insulin glargine-yfgn  42 Units Subcutaneous Daily  . metroNIDAZOLE  500 mg Oral Q12H  . multivitamin with minerals  1 tablet Oral Daily   Continuous Infusions: . sodium chloride Stopped (03/31/22 0425)  . sodium chloride    . cefTRIAXone (ROCEPHIN)  IV     PRN Meds:.sodium chloride, sodium chloride, acetaminophen **OR** acetaminophen, alum & mag hydroxide-simeth, dextrose, guaiFENesin, hydrALAZINE, ipratropium-albuterol, metoprolol tartrate, ondansetron **OR** ondansetron (ZOFRAN) IV, mouth rinse, oxyCODONE, senna-docusate, zolpidem  HPI:    Courtney Parks is a 61 y.o. female with a past medical history of hypertension, hyperlipidemia, kidney stones, depression/anxiety, type 2 diabetes who presented to the hospital 03/29/2022 with sepsis and DKA found to have a puncture wound of her right foot complicated by cellulitis and abscess.  Status post I&D with podiatry.  Currently on broad-spectrum antibiotics with vancomycin, cefepime, metronidazole.  We have been consulted for further antibiotic recommendations.   Past Medical History:  Diagnosis Date  . Anxiety   . Depression   . Diabetes mellitus, type II ([place])   . Hyperlipidemia   . Hypertension   . Kidney stones     Social History   Tobacco Use  . Smoking status: Former    Years: 5.00    Types: Cigarettes    Quit date: 12/12/1983    Years since quitting: 38.3  . Smokeless tobacco: Never  Substance Use Topics  . Alcohol use: No  . Drug use: No    Family History  Problem Relation Age of Onset  .  Diabetes Mother   . Hypertension Mother   . Colon cancer Mother   . Hypertension Father   . ADD / ADHD Maternal Grandmother     Allergies  Allergen Reactions  . Codeine     REACTION: Nausea  . Demerol Nausea And Vomiting  . Other Other (See Comments)    Narcotics- drops her blood pressure  . Shellfish Allergy Swelling    Review of Systems   Constitutional:  Positive for fever and malaise/fatigue.  Respiratory: Negative.    Cardiovascular: Negative.   Gastrointestinal:  Positive for abdominal pain.  Musculoskeletal:  Positive for joint pain.  Skin:        Foot wound  All other systems reviewed and are negative.   OBJECTIVE:   Blood pressure 121/87, pulse 94, temperature 98.9 F (37.2 C), temperature source Oral, resp. rate 18, height '5\' 7"'$  (1.702 m), weight 90.9 kg, SpO2 94 %. Body mass index is 31.39 kg/m.  Physical Exam Constitutional:      General: She is not in acute distress.    Appearance: She is well-developed.  HENT:     Head: Normocephalic and atraumatic.  Eyes:     Extraocular Movements: Extraocular movements intact.     Conjunctiva/sclera: Conjunctivae normal.  Pulmonary:     Effort: Pulmonary effort is normal. No respiratory distress.  Abdominal:     General: There is no distension.     Palpations: Abdomen is soft.  Musculoskeletal:     Cervical back: Normal range of motion and neck supple.     Comments: Right foot in Darco boot and wrapped.  Pictures reviewed in Media tab.  Skin:    General: Skin is warm and dry.  Neurological:     General: No focal deficit present.     Mental Status: She is alert and oriented to person, place, and time.  Psychiatric:        Mood and Affect: Mood normal.        Behavior: Behavior normal.      Lab Results: Lab Results  Component Value Date   WBC 14.0 (H) 04/01/2022   HGB 11.7 (L) 04/01/2022   HCT 36.0 04/01/2022   MCV 92.3 04/01/2022   PLT 218 04/01/2022    Lab Results  Component Value Date   NA 136 04/01/2022   K 3.6 04/01/2022   CO2 21 (L) 04/01/2022   GLUCOSE 197 (H) 04/01/2022   BUN 8 04/01/2022   CREATININE 0.37 (L) 04/01/2022   CALCIUM 8.5 (L) 04/01/2022   GFRNONAA >60 04/01/2022   GFRAA >60 10/28/2016    Lab Results  Component Value Date   ALT 14 03/30/2022   AST 11 (L) 03/30/2022   ALKPHOS 61 03/30/2022   BILITOT 0.6 03/30/2022        Component Value Date/Time   CRP 20.6 (H) 03/29/2022 1718       Component Value Date/Time   ESRSEDRATE 80 (H) 03/29/2022 1718    I have reviewed the micro and lab results in Epic.  Imaging: No results found.   Imaging independently reviewed in Epic.  Raynelle [facility] for Infectious Disease [facility] [phone] pager 04/01/2022, 9:46 AM

## 2022-04-01 NOTE — Progress Notes (Signed)
  Subjective:  Patient ID: Courtney Parks, female    DOB: 05-Dec-1960,  MRN: [id number]  Seen at bedside status post I&D of right foot POD #3.  She is not having much pain in the foot.  She is doing okay and resting comfortable.  Negative for chest pain and shortness of breath Chest pain: no Shortness of breath: no Fever: no Review of all other systems is negative Objective:   Vitals:   03/31/22 2126 04/01/22 0526  BP: 137/67 121/87  Pulse: 98 94  Resp: 18 18  Temp: 100.3 F (37.9 C) 98.9 F (37.2 C)  SpO2: 95% 94%   General AA&O x3. Normal mood and affect.  Vascular Dorsalis pedis and posterior tibial pulses faintly palpable. sluggish capillary refill to all digits. Pedal hair not present.  Neurologic Epicritic sensation grossly intact.  Dermatologic No new purulence.  Serous drainage.  Slight malodor to packing.  Initial area of bruising from abscess is beginning to become necrotic.  Cellulitis does seem improved, edema seems improved  Orthopedic: MMT 5/5    Culture with GPC's and GNR's   Assessment & Plan:  Patient was evaluated and treated and all questions answered.  Severe diabetic foot infection right foot POD #3 status post I&D of right foot -Continue broad-spectrum antibiotics for now.   -Appreciate ID input -I will take her to the operating room on Wednesday for further washout debridement with a possible VAC application with a possible primary closure. -continue dressing changes daily.  -ABIs PVRs were reviewed which does show triphasic flow to the foot.  We will hold off on vascular consult for now. -Partial weightbearing to the heel for transfer ideally nonweightbearing to the right foot.  Felipa Furnace, DPM  Accessible via secure chat for questions or concerns.

## 2022-04-01 NOTE — Progress Notes (Signed)
ABI's have been completed. Preliminary results can be found in CV Proc through chart review.   04/01/22 12:54 PM Courtney Parks RVT

## 2022-04-01 NOTE — Progress Notes (Signed)
PROGRESS NOTE    Courtney Parks  LEX:[id number] DOB: [birth year] DOA: 03/29/2022 PCP: Pcp, No   Brief Narrative:  61 year old with history of anxiety, depression, DM 2, HLD, HTN, lithiasis comes to the hospital with complaints of right lower quadrant abdominal pain and CVA tenderness on the right side ongoing for the past week along with fatigue.  UA was consistent with glucosuria and urinary tract infection.  CT abdomen pelvis showed nonobstructive left-sided nephrolithiasis and hepatic steatosis.  She was found to have puncture wound of her right foot and was in diabetic ketoacidosis. S/p I&D right foot now dressing changes Podiatry.  ID consulted to help with antibiotic management.  Currently on Rocephin/Flagyl.   Assessment & Plan:  Principal Problem:   DKA (diabetic ketoacidosis) ([place]) Active Problems:   Diabetic foot infection ([place])   Hyperlipidemia   Hypertension   History of kidney stones   Anxiety and depression   Hypercalcemia   Puncture wound of plantar aspect of foot   Acute UTI (urinary tract infection)   Abscess of right foot    DKA (diabetic ketoacidosis) ([place]) - resolved.  Insulin-dependent diabetes mellitus type 2 -Hemoglobin A1c 10.3.  DKA protocol stopped.  Anion gap closed this morning.  Adjust Semglee + ISS.  Increase Semglee to 42 units - Consulted diabetic coordinator   Sepsis secondary to puncture wound of plantar aspect of right foot Cellulitis of the right foot with loculated abscess Status post I&D of the right foot Sepsis improving. - Reviewed culture data.  Infectious disease consulted - Current antibiotics IV Rocephin and Flagyl -MRI shows small loculated abscess -Seen by podiatry     Hyperlipidemia May benefit from statin therapy.  Will order lipid panel for tomorrow morning     Hypertension Pain control. Consider starting ACE inhibitor if BP elevated.     Acute UTI (urinary tract infection)   History of kidney stones.   Left-sided nonobstructive stone On empiric IV Rocephin     Anxiety and depression Ambien at bedtime while in the hospital. Follow-up with [facility] and/or PCP.     Hypercalcemia Likely from dehydration, resolved  Hypokalemia/hypophosphatemia - Repletion as needed     DVT prophylaxis: enoxaparin (LOVENOX) injection 40 mg Start: 03/29/22 1800 Code Status: Full code Family Communication: Family at bedside  On IV Abx, cont hosp stay  Subjective: Seen and examined at bedside, no new complaints this morning  Examination: Constitutional: Not in acute distress Respiratory: Clear to auscultation bilaterally Cardiovascular: Normal sinus rhythm, no rubs Abdomen: Nontender nondistended good bowel sounds Musculoskeletal: No edema noted Skin: Right lower extremity dressing in place Neurologic: CN 2-12 grossly intact.  And nonfocal Psychiatric: Normal judgment and insight. Alert and oriented x 3. Normal mood.  Objective: Vitals:   03/31/22 1438 03/31/22 1829 03/31/22 2126 04/01/22 0526  BP: (!) 153/74 137/69 137/67 121/87  Pulse: 98 93 98 94  Resp: '18 15 18 18  '$ Temp: 99.3 F (37.4 C) 99.1 F (37.3 C) 100.3 F (37.9 C) 98.9 F (37.2 C)  TempSrc: Oral Oral Oral Oral  SpO2: 98% 100% 95% 94%  Weight:      Height:        Intake/Output Summary (Last 24 hours) at 04/01/2022 1149 Last data filed at 04/01/2022 0900 Gross per 24 hour  Intake 1191.16 ml  Output --  Net 1191.16 ml   Filed Weights   03/29/22 0614 03/31/22 0629  Weight: 81.2 kg 90.9 kg     Data Reviewed:   CBC: Recent Labs  Lab  03/29/22 6314 03/29/22 9702 03/30/22 0259 03/31/22 0246 04/01/22 0520  WBC 20.3*  --  14.6* 14.5* 14.0*  NEUTROABS 16.9*  --   --   --   --   HGB 14.0 13.6 11.0* 11.1* 11.7*  HCT 40.6 40.0 32.9* 33.0* 36.0  MCV 89.2  --  89.2 89.7 92.3  PLT 244  --  205 185 637   Basic Metabolic Panel: Recent Labs  Lab 03/29/22 0626 03/29/22 0656 03/29/22 1408 03/30/22 0259 03/30/22 1204  03/31/22 0246 04/01/22 0520  NA 132* 131* 135 140  --  137 136  K 4.5 5.3* 2.9* 3.0*  --  3.3* 3.6  CL 96*  --  106 111  --  109 107  CO2 15*  --  19* 22  --  21* 21*  GLUCOSE 361*  --  215* 188*  --  248* 197*  BUN 15  --  10 9  --  7 8  CREATININE 0.68  --  0.50 0.37*  --  0.49 0.37*  CALCIUM 10.4*  --  9.2 8.7*  --  8.5* 8.5*  MG  --   --   --  1.7  --  1.8 1.9  PHOS  --   --   --  1.3* 1.0*  --  2.5   GFR: Estimated Creatinine Clearance: 86.5 mL/min (A) (by C-G formula based on SCr of 0.37 mg/dL (L)). Liver Function Tests: Recent Labs  Lab 03/29/22 0626 03/30/22 0259  AST 16 11*  ALT 16 14  ALKPHOS 76 61  BILITOT 0.8 0.6  PROT 7.5 5.7*  ALBUMIN 4.3 2.6*   Recent Labs  Lab 03/29/22 0626  LIPASE 16   No results for input(s): "AMMONIA" in the last 168 hours. Coagulation Profile: No results for input(s): "INR", "PROTIME" in the last 168 hours. Cardiac Enzymes: No results for input(s): "CKTOTAL", "CKMB", "CKMBINDEX", "TROPONINI" in the last 168 hours. BNP (last 3 results) No results for input(s): "PROBNP" in the last 8760 hours. HbA1C: Recent Labs    03/29/22 1403  HGBA1C 10.3*   CBG: Recent Labs  Lab 03/31/22 0729 03/31/22 1232 03/31/22 1726 03/31/22 2127 04/01/22 0719  GLUCAP 278* 215* 181* 179* 193*   Lipid Profile: No results for input(s): "CHOL", "HDL", "LDLCALC", "TRIG", "CHOLHDL", "LDLDIRECT" in the last 72 hours. Thyroid Function Tests: No results for input(s): "TSH", "T4TOTAL", "FREET4", "T3FREE", "THYROIDAB" in the last 72 hours. Anemia Panel: No results for input(s): "VITAMINB12", "FOLATE", "FERRITIN", "TIBC", "IRON", "RETICCTPCT" in the last 72 hours. Sepsis Labs: No results for input(s): "PROCALCITON", "LATICACIDVEN" in the last 168 hours.  Recent Results (from the past 240 hour(s))  Urine Culture     Status: Abnormal   Collection Time: 03/29/22  8:35 AM   Specimen: Urine, Clean Catch  Result Value Ref Range Status   Specimen  Description   Final    URINE, CLEAN CATCH Performed at [facility], [address] [place], [place] [address]    Special Requests   Final    NONE Performed at [facility], [address], [place], [place] [address]    Culture MULTIPLE SPECIES PRESENT, SUGGEST RECOLLECTION (A)  Final   Report Status 03/30/2022 FINAL  Final  MRSA Next Gen by PCR, Nasal     Status: None   Collection Time: 03/29/22  2:04 PM   Specimen: Nasal Mucosa; Nasal Swab  Result Value Ref Range Status   MRSA by PCR Next Gen NOT DETECTED NOT DETECTED Final  Comment: (NOTE) The GeneXpert MRSA Assay (FDA approved for NASAL specimens only), is one component of a comprehensive MRSA colonization surveillance program. It is not intended to diagnose MRSA infection nor to guide or monitor treatment for MRSA infections. Test performance is not FDA approved in patients less than 33 years old. Performed at [facility], [place] [address], [place], [place] [address]   Aerobic/Anaerobic Culture w Gram Stain (surgical/deep wound)     Status: None (Preliminary result)   Collection Time: 03/30/22  8:37 AM   Specimen: PATH Other; Tissue  Result Value Ref Range Status   Specimen Description   Final    TISSUE Performed at [facility] [address], [place], [place] [address]    Special Requests   Final    TISSUE Performed at [facility], [place] [address] [place], [place] [address]    Gram Stain   Final    NO SQUAMOUS EPITHELIAL CELLS SEEN FEW WBC SEEN MODERATE GRAM POSITIVE COCCI MODERATE GRAM NEGATIVE RODS    Culture   Final    CULTURE REINCUBATED FOR BETTER GROWTH ABUNDANT GROUP B STREP(S.AGALACTIAE)ISOLATED TESTING AGAINST S. AGALACTIAE NOT ROUTINELY PERFORMED DUE TO PREDICTABILITY OF AMP/PEN/VAN SUSCEPTIBILITY. Performed at [facility], [place] [address] [place],  [address]    Report Status PENDING   Incomplete         Radiology Studies: No results found.      Scheduled Meds:  Chlorhexidine Gluconate Cloth  6 each Topical Q0600   enoxaparin (LOVENOX) injection  40 mg Subcutaneous Q24H   feeding supplement (GLUCERNA SHAKE)  237 mL Oral BID BM   insulin aspart  0-15 Units Subcutaneous TID WC   insulin glargine-yfgn  42 Units Subcutaneous Daily   metroNIDAZOLE  500 mg Oral Q12H   multivitamin with minerals  1 tablet Oral Daily   Continuous Infusions:  sodium chloride Stopped (03/31/22 0425)   sodium chloride     cefTRIAXone (ROCEPHIN)  IV       LOS: 2 days   Time spent= 35 mins    Hanny Elsberry Arsenio Loader, MD [facility]  If 7PM-7AM, please contact night-coverage  04/01/2022, 11:49 AM

## 2022-04-01 NOTE — Progress Notes (Signed)
Contacted Dr. Reesa Chew to see if he wanted me to start Sepsis protocol. He said not needed. Blood cultures were drawn. Tylenol was given after blood cultures were drawn. Temperature came down to  99.5 orally.

## 2022-04-02 DIAGNOSIS — S91331A Puncture wound without foreign body, right foot, initial encounter: Secondary | ICD-10-CM | POA: Diagnosis not present

## 2022-04-02 DIAGNOSIS — Z87442 Personal history of urinary calculi: Secondary | ICD-10-CM | POA: Diagnosis not present

## 2022-04-02 DIAGNOSIS — N390 Urinary tract infection, site not specified: Secondary | ICD-10-CM | POA: Diagnosis not present

## 2022-04-02 DIAGNOSIS — E1110 Type 2 diabetes mellitus with ketoacidosis without coma: Secondary | ICD-10-CM | POA: Diagnosis not present

## 2022-04-02 DIAGNOSIS — F419 Anxiety disorder, unspecified: Secondary | ICD-10-CM | POA: Diagnosis not present

## 2022-04-02 DIAGNOSIS — E11628 Type 2 diabetes mellitus with other skin complications: Secondary | ICD-10-CM | POA: Diagnosis not present

## 2022-04-02 DIAGNOSIS — L02611 Cutaneous abscess of right foot: Secondary | ICD-10-CM | POA: Diagnosis not present

## 2022-04-02 LAB — LACTIC ACID, PLASMA
Lactic Acid, Venous: 0.8 mmol/L (ref 0.5–1.9)
Lactic Acid, Venous: 1.4 mmol/L (ref 0.5–1.9)

## 2022-04-02 LAB — AEROBIC/ANAEROBIC CULTURE W GRAM STAIN (SURGICAL/DEEP WOUND): Gram Stain: NONE SEEN

## 2022-04-02 LAB — CBC
HCT: 34.5 % — ABNORMAL LOW (ref 36.0–46.0)
Hemoglobin: 11.2 g/dL — ABNORMAL LOW (ref 12.0–15.0)
MCH: 29.5 pg (ref 26.0–34.0)
MCHC: 32.5 g/dL (ref 30.0–36.0)
MCV: 90.8 fL (ref 80.0–100.0)
Platelets: 238 10*3/uL (ref 150–400)
RBC: 3.8 MIL/uL — ABNORMAL LOW (ref 3.87–5.11)
RDW: 13 % (ref 11.5–15.5)
WBC: 14.9 10*3/uL — ABNORMAL HIGH (ref 4.0–10.5)
nRBC: 0 % (ref 0.0–0.2)

## 2022-04-02 LAB — BASIC METABOLIC PANEL
Anion gap: 7 (ref 5–15)
BUN: 5 mg/dL — ABNORMAL LOW (ref 6–20)
CO2: 25 mmol/L (ref 22–32)
Calcium: 8.4 mg/dL — ABNORMAL LOW (ref 8.9–10.3)
Chloride: 108 mmol/L (ref 98–111)
Creatinine, Ser: 0.38 mg/dL — ABNORMAL LOW (ref 0.44–1.00)
GFR, Estimated: >60 mL/min (ref >60)
Glucose, Bld: 123 mg/dL — ABNORMAL HIGH (ref 70–99)
Potassium: 3.6 mmol/L (ref 3.5–5.1)
Sodium: 140 mmol/L (ref 135–145)

## 2022-04-02 LAB — MAGNESIUM: Magnesium: 2.1 mg/dL (ref 1.7–2.4)

## 2022-04-02 LAB — LIPID PANEL
Cholesterol: 102 mg/dL (ref 0–200)
HDL: 18 mg/dL — ABNORMAL LOW (ref >40)
LDL Cholesterol: 68 mg/dL (ref 0–99)
Total CHOL/HDL Ratio: 5.7 RATIO
Triglycerides: 82 mg/dL (ref <150)
VLDL: 16 mg/dL (ref 0–40)

## 2022-04-02 LAB — GLUCOSE, CAPILLARY
Glucose-Capillary: 124 mg/dL — ABNORMAL HIGH (ref 70–99)
Glucose-Capillary: 156 mg/dL — ABNORMAL HIGH (ref 70–99)
Glucose-Capillary: 190 mg/dL — ABNORMAL HIGH (ref 70–99)
Glucose-Capillary: 96 mg/dL (ref 70–99)

## 2022-04-02 MED ORDER — SODIUM CHLORIDE 0.9 % IV SOLN
12.5000 mg | Freq: Four times a day (QID) | INTRAVENOUS | Status: DC | PRN
  Administered 2022-04-02: 12.5 mg via INTRAVENOUS
  Filled 2022-04-02: qty 0.5

## 2022-04-02 MED ORDER — LIP MEDEX EX OINT
1.0000 | TOPICAL_OINTMENT | CUTANEOUS | Status: DC | PRN
  Filled 2022-04-02: qty 7

## 2022-04-02 MED ORDER — AMPICILLIN-SULBACTAM SODIUM 3 (2-1) G IJ SOLR
3.0000 g | Freq: Four times a day (QID) | INTRAMUSCULAR | Status: DC
  Administered 2022-04-02 – 2022-04-05 (×12): 3 g via INTRAVENOUS
  Filled 2022-04-02 (×13): qty 8

## 2022-04-02 NOTE — Progress Notes (Signed)
PROGRESS NOTE    Courtney [place]  ION:[id number] DOB: 10/31/1960 DOA: 03/29/2022 PCP: Pcp, No   Brief Narrative:  61 year old with history of anxiety, depression, DM 2, HLD, HTN, lithiasis comes to the hospital with complaints of right lower quadrant abdominal pain and CVA tenderness on the right side ongoing for the past week along with fatigue.  UA was consistent with glucosuria and urinary tract infection.  CT abdomen pelvis showed nonobstructive left-sided nephrolithiasis and hepatic steatosis.  She was found to have puncture wound of her right foot and was in diabetic ketoacidosis. S/p I&D right foot now dressing changes Podiatry.  ID consulted to help with antibiotic management.  Currently on Rocephin/Flagyl.   Assessment & Plan:  Principal Problem:   DKA (diabetic ketoacidosis) ([place]) Active Problems:   Diabetic foot infection ([place])   Hyperlipidemia   Hypertension   History of kidney stones   Anxiety and depression   Hypercalcemia   Puncture wound of plantar aspect of foot   Acute UTI (urinary tract infection)   Abscess of right foot    DKA (diabetic ketoacidosis) ([place]) - resolved.  Insulin-dependent diabetes mellitus type 2 -Hemoglobin A1c 10.3.  DKA protocol stopped.  Anion gap closed this morning.  Adjust Semglee + ISS.  Semglee to 42 units - Consulted diabetic coordinator   Sepsis secondary to puncture wound of plantar aspect of right foot Cellulitis of the right foot with loculated abscess Status post I&D of the right foot Spiking fevers again. Repeat Cultures drawn. Will Check lactate. Already on Sepsis Protocol.  - Current antibiotics IV Rocephin and Flagyl. Add back Vanc? Will defer this to ID. Cx growing enteroccocus as well  -MRI shows small loculated abscess -Seen by podiatry- plans for OR again tomorrow.  ABIs - ok.      Hyperlipidemia LDL is 68, diet controlled.      Hypertension BP is stable for the most part.      Acute UTI (urinary tract  infection)   History of kidney stones.  Left-sided nonobstructive stone On empiric IV Rocephin     Anxiety and depression Ambien at bedtime while in the hospital. Follow-up with [facility] and/or PCP.     Hypercalcemia Likely from dehydration, resolved  Hypokalemia/hypophosphatemia - Repletion as needed     DVT prophylaxis: enoxaparin (LOVENOX) injection 40 mg Start: 03/29/22 1800 Code Status: Full code Family Communication: Family at bedside  On IV Abx, cont hosp stay  Subjective: Seen and examined at bedside, no complaints.  Had fevers yesterday.  Examination: Constitutional: Not in acute distress Respiratory: Clear to auscultation bilaterally Cardiovascular: Normal sinus rhythm, no rubs Abdomen: Nontender nondistended good bowel sounds Musculoskeletal: No edema noted Skin: Right lower extremity dressing in place Neurologic: CN 2-12 grossly intact.  And nonfocal Psychiatric: Normal judgment and insight. Alert and oriented x 3. Normal mood.  Objective: Vitals:   04/01/22 1948 04/01/22 2152 04/02/22 0150 04/02/22 0702  BP: 128/67 116/63 (!) 151/55 118/63  Pulse: 85 84 97 89  Resp: '18 18 20 18  '$ Temp: 99.5 F (37.5 C) 99.1 F (37.3 C) (!) 100.4 F (38 C) 99.4 F (37.4 C)  TempSrc: Oral Oral Oral Oral  SpO2: 91% 93% 91% 93%  Weight:      Height:        Intake/Output Summary (Last 24 hours) at 04/02/2022 0802 Last data filed at 04/02/2022 0656 Gross per 24 hour  Intake 630 ml  Output --  Net 630 ml   Filed Weights   03/29/22 [phone]  03/31/22 0629  Weight: 81.2 kg 90.9 kg     Data Reviewed:   CBC: Recent Labs  Lab 03/29/22 0626 03/29/22 0656 03/30/22 0259 03/31/22 0246 04/01/22 0520 04/02/22 0451  WBC 20.3*  --  14.6* 14.5* 14.0* 14.9*  NEUTROABS 16.9*  --   --   --   --   --   HGB 14.0 13.6 11.0* 11.1* 11.7* 11.2*  HCT 40.6 40.0 32.9* 33.0* 36.0 34.5*  MCV 89.2  --  89.2 89.7 92.3 90.8  PLT 244  --  205 185 218 638   Basic Metabolic Panel: Recent Labs   Lab 03/29/22 1408 03/30/22 0259 03/30/22 1204 03/31/22 0246 04/01/22 0520 04/02/22 0451  NA 135 140  --  137 136 140  K 2.9* 3.0*  --  3.3* 3.6 3.6  CL 106 111  --  109 107 108  CO2 19* 22  --  21* 21* 25  GLUCOSE 215* 188*  --  248* 197* 123*  BUN 10 9  --  7 8 5*  CREATININE 0.50 0.37*  --  0.49 0.37* 0.38*  CALCIUM 9.2 8.7*  --  8.5* 8.5* 8.4*  MG  --  1.7  --  1.8 1.9 2.1  PHOS  --  1.3* 1.0*  --  2.5  --    GFR: Estimated Creatinine Clearance: 86.5 mL/min (A) (by C-G formula based on SCr of 0.38 mg/dL (L)). Liver Function Tests: Recent Labs  Lab 03/29/22 0626 03/30/22 0259  AST 16 11*  ALT 16 14  ALKPHOS 76 61  BILITOT 0.8 0.6  PROT 7.5 5.7*  ALBUMIN 4.3 2.6*   Recent Labs  Lab 03/29/22 0626  LIPASE 16   No results for input(s): "AMMONIA" in the last 168 hours. Coagulation Profile: No results for input(s): "INR", "PROTIME" in the last 168 hours. Cardiac Enzymes: No results for input(s): "CKTOTAL", "CKMB", "CKMBINDEX", "TROPONINI" in the last 168 hours. BNP (last 3 results) No results for input(s): "PROBNP" in the last 8760 hours. HbA1C: No results for input(s): "HGBA1C" in the last 72 hours.  CBG: Recent Labs  Lab 04/01/22 0719 04/01/22 1201 04/01/22 1619 04/01/22 2154 04/02/22 0757  GLUCAP 193* 220* 189* 119* 124*   Lipid Profile: Recent Labs    04/02/22 0451  CHOL 102  HDL 18*  LDLCALC 68  TRIG 82  CHOLHDL 5.7   Thyroid Function Tests: No results for input(s): "TSH", "T4TOTAL", "FREET4", "T3FREE", "THYROIDAB" in the last 72 hours. Anemia Panel: No results for input(s): "VITAMINB12", "FOLATE", "FERRITIN", "TIBC", "IRON", "RETICCTPCT" in the last 72 hours. Sepsis Labs: No results for input(s): "PROCALCITON", "LATICACIDVEN" in the last 168 hours.  Recent Results (from the past 240 hour(s))  Urine Culture     Status: Abnormal   Collection Time: 03/29/22  8:35 AM   Specimen: Urine, Clean Catch  Result Value Ref Range Status   Specimen  Description   Final    URINE, CLEAN CATCH Performed at [facility], [address] [place], [place] [address]    Special Requests   Final    NONE Performed at [facility], [address], [place], [place] [address]    Culture MULTIPLE SPECIES PRESENT, SUGGEST RECOLLECTION (A)  Final   Report Status 03/30/2022 FINAL  Final  MRSA Next Gen by PCR, Nasal     Status: None   Collection Time: 03/29/22  2:04 PM   Specimen: Nasal Mucosa; Nasal Swab  Result Value Ref Range Status   MRSA by PCR Next  Gen NOT DETECTED NOT DETECTED Final    Comment: (NOTE) The GeneXpert MRSA Assay (FDA approved for NASAL specimens only), is one component of a comprehensive MRSA colonization surveillance program. It is not intended to diagnose MRSA infection nor to guide or monitor treatment for MRSA infections. Test performance is not FDA approved in patients less than 81 years old. Performed at [facility], [place] [address], [place], [place] [address]   Aerobic/Anaerobic Culture w Gram Stain (surgical/deep wound)     Status: None (Preliminary result)   Collection Time: 03/30/22  8:37 AM   Specimen: PATH Other; Tissue  Result Value Ref Range Status   Specimen Description   Final    TISSUE Performed at [facility] [address] [place], [place] [address]    Special Requests   Final    TISSUE Performed at [facility], [place] [address] [place], [place] [address]    Gram Stain   Final    NO SQUAMOUS EPITHELIAL CELLS SEEN FEW WBC SEEN MODERATE GRAM POSITIVE COCCI MODERATE GRAM NEGATIVE RODS    Culture   Final    CULTURE REINCUBATED FOR BETTER GROWTH ABUNDANT GROUP B STREP(S.AGALACTIAE)ISOLATED TESTING AGAINST S. AGALACTIAE NOT ROUTINELY PERFORMED DUE TO PREDICTABILITY OF AMP/PEN/VAN SUSCEPTIBILITY. HOLDING FOR POSSIBLE ANAEROBE Performed at [facility], [place] [address], [place], [place] [address]     Report Status PENDING  Incomplete         Radiology Studies: VAS [place] ABI WITH/WO TBI  Result Date: 04/01/2022  LOWER EXTREMITY DOPPLER STUDY Patient Name:  Courtney [place]  Date of Exam:   04/01/2022 Medical Rec #: [id number]                 Accession #:    [id number] Date of Birth: 04/18/1961                Patient Gender: F Patient Age:   74 years Exam Location:  [facility] Procedure:      VAS [place] ABI WITH/WO TBI Referring Phys: ADAM MCDONALD --------------------------------------------------------------------------------  Indications: Ulceration. High Risk Factors: Hypertension, hyperlipidemia, Diabetes.  Comparison Study: No prior studies. Performing Technologist: Carlos Levering RVT  Examination Guidelines: A complete evaluation includes at minimum, Doppler waveform signals and systolic blood pressure reading at the level of bilateral brachial, anterior tibial, and posterior tibial arteries, when vessel segments are accessible. Bilateral testing is considered an integral part of a complete examination. Photoelectric Plethysmograph (PPG) waveforms and toe systolic pressure readings are included as required and additional duplex testing as needed. Limited examinations for reoccurring indications may be performed as noted.  ABI Findings: +---------+------------------+-----+-----------+--------+ Right    Rt Pressure (mmHg)IndexWaveform   Comment  +---------+------------------+-----+-----------+--------+ Brachial 122                    triphasic           +---------+------------------+-----+-----------+--------+ PTA      144               1.04 multiphasic         +---------+------------------+-----+-----------+--------+ DP       137               0.99 multiphasic         +---------+------------------+-----+-----------+--------+ Great Toe35                0.25                     +---------+------------------+-----+-----------+--------+  +---------+------------------+-----+---------+-------+  Left     Lt Pressure (mmHg)IndexWaveform Comment +---------+------------------+-----+---------+-------+ Brachial 138                    triphasic        +---------+------------------+-----+---------+-------+ PTA      152               1.10 triphasic        +---------+------------------+-----+---------+-------+ DP       150               1.09 triphasic        +---------+------------------+-----+---------+-------+ Great Toe99                0.72                  +---------+------------------+-----+---------+-------+ +-------+-----------+-----------+------------+------------+ ABI/TBIToday's ABIToday's TBIPrevious ABIPrevious TBI +-------+-----------+-----------+------------+------------+ Right  1.04       0.25                                +-------+-----------+-----------+------------+------------+ Left   1.1        0.72                                +-------+-----------+-----------+------------+------------+  Summary: Right: Resting right ankle-brachial index is within normal range. No evidence of significant right lower extremity arterial disease. The right toe-brachial index is abnormal. Left: Resting left ankle-brachial index is within normal range. No evidence of significant left lower extremity arterial disease. The left toe-brachial index is normal. *See table(s) above for measurements and observations.     Preliminary         Scheduled Meds:  Chlorhexidine Gluconate Cloth  6 each Topical Q0600   enoxaparin (LOVENOX) injection  40 mg Subcutaneous Q24H   feeding supplement (GLUCERNA SHAKE)  237 mL Oral BID BM   Gerhardt's butt cream   Topical BID   insulin aspart  0-15 Units Subcutaneous TID WC   insulin glargine-yfgn  42 Units Subcutaneous Daily   metroNIDAZOLE  500 mg Oral Q12H   multivitamin with minerals  1 tablet Oral Daily   Continuous Infusions:  sodium chloride Stopped (03/31/22 0425)    sodium chloride     cefTRIAXone (ROCEPHIN)  IV 2 g (04/01/22 1153)   promethazine (PHENERGAN) injection (IM or IVPB) 12.5 mg (04/02/22 0533)     LOS: 3 days   Time spent= 35 mins    Marshaun Lortie Arsenio Loader, MD [facility]  If 7PM-7AM, please contact night-coverage  04/02/2022, 8:02 AM

## 2022-04-02 NOTE — Progress Notes (Signed)
Physical Therapy Treatment Patient Details Name: Courtney [place] MRN: [id number] DOB: 1961-03-15 Today's Date: 04/02/2022   History of Present Illness 61 year old with history of anxiety, depression, DM 2, HLD, HTN, lithiasis comes to the hospital with complaints of right lower quadrant abdominal pain and CVA tenderness on the right side ongoing for the past week along with fatigue. CT abdomen pelvis showed nonobstructive left-sided nephrolithiasis and hepatic steatosis.  She was found to have puncture wound of her right foot and was in diabetic ketoacidosis. S/p I&D right foot now with post op shoe recommended.    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  Very supportive Son in room as well.   Pt self able to get OOB.  General transfer comment: 25% VC's on safety and to ensure WBing through heel. General Gait Details: cues for posture, sequence and postion from RW;  Pt with good awareness and with noted heel walking only on R LE with post op shoe in place. Discussed WBing through heel however MD prefers NWB.  Then Introduced to knee scooter.  Adjusted to height.  Educated on use and safety.  Instructed on proper mounting/dismounting while securing B brake handles.  Tolerated scooting in hallway approx 115 then c/o fatigue.  "I'm amaxed how tired I am".  Discussed Infectious process and how that can weaken/fatigue you.  Pt plans to get a knee scooter.  Will ask TOC to reach out for cost to rent.  Son pricing some an Tourist information centre manager. Stairs:  discussed stairs.  Pt said she has a "ramp out back" that she said she can use. Son stated pt having a repeat I&D tomorrow morning.   Pt plans to return home with family support.    Recommendations for follow up therapy are one component of a multi-disciplinary discharge planning process, led by the attending physician.  Recommendations may be updated based on patient status, additional  functional criteria and insurance authorization.  Follow Up Recommendations  No PT follow up     Assistance Recommended at Discharge PRN  Patient can return home with the following A little help with walking and/or transfers;Assist for transportation;Help with stairs or ramp for entrance;Assistance with cooking/housework   Equipment Recommendations  Other (comment) (knee scooter(pt wants to know cost first))    Recommendations for Other Services       Precautions / Restrictions Precautions Precautions: Fall Required Braces or Orthoses: Splint/Cast Splint/Cast: post OP shoe Restrictions Weight Bearing Restrictions: Yes Other Position/Activity Restrictions: " Ideally NWB can be PWB through heel for transfers" per Ortho note     Mobility  Bed Mobility Overal bed mobility: Modified Independent             General bed mobility comments: self able in/OOB    Transfers Overall transfer level: Modified independent Equipment used: Rolling walker (2 wheels)               General transfer comment: 25% VC's on safety and to ensure WBing through heel.    Ambulation/Gait Ambulation/Gait assistance: Min guard, Supervision Gait Distance (Feet): 22 Feet Assistive device: Rolling walker (2 wheels) Gait Pattern/deviations: Step-to pattern, Decreased step length - right, Decreased step length - left, Shuffle Gait velocity: decreased     General Gait Details: cues for posture, sequence and postion from RW;  Pt with good awareness and with noted heel walking only on R LE with post op shoe in  place. Discussed WBing through heel however MD prefers NWB.  Then Introduced to knee scooter.  Adjusted to height.  Educated on use and safety.  Instructed on proper mounting/dismounting while securing B brake handles.  Tolerated scooting in hallway approx 115 then c/o fatigue.  "I'm amaxed how tired I am".  Discussed Infectious process and how that can weaken/fatigue you.  Pt plans to get a  knee scooter.  Will ask TOC to reach out for cost to rent.  Son pricing some an Tourist information centre manager.   Stairs Stairs:  (discussed stairs.  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  Very supportive Son in room as well.        Exercises      General Comments        Pertinent Vitals/Pain Pain Assessment Pain Assessment: Faces Faces Pain Scale: Hurts a little bit Pain Location: R foot Pain Descriptors / Indicators: Tender Pain Intervention(s): Monitored during session, Repositioned    Home Living                          Prior Function            PT Goals (current goals can now be found in the care plan section) Progress towards PT goals: Progressing toward goals    Frequency    Min 3X/week      PT Plan Current plan remains appropriate    Co-evaluation              AM-PAC PT "6 Clicks" Mobility   Outcome Measure  Help needed turning from your back to your side while in a flat bed without using bedrails?: None Help needed moving from lying on your back to sitting on the side of a flat bed without using bedrails?: None Help needed moving to and from a bed to a chair (including a wheelchair)?: None Help needed standing up from a chair using your arms (e.g., wheelchair or bedside chair)?: None Help needed to walk in hospital room?: A Little Help needed climbing 3-5 steps with a railing? : A Little 6 Click Score: 22    End of Session Equipment Utilized During Treatment: Gait belt Activity  Tolerance: Patient limited by fatigue Patient left: in bed;with call bell/phone within reach;with bed alarm set Nurse Communication: Mobility status PT Visit Diagnosis: Unsteadiness on feet (R26.81)     Time: 2778-2423 PT Time Calculation (min) (ACUTE ONLY): 26 min  Charges:  $Gait Training: 8-22 mins $Therapeutic Activity: 8-22 mins                     Rica Koyanagi  PTA Acute  Rehabilitation Services Office M-F          [phone] Weekend pager [phone]

## 2022-04-02 NOTE — Progress Notes (Signed)
[facility]  Date of Admission:  03/29/2022           Reason for visit: Follow up on diabetic foot infection  Current antibiotics: Ceftriaxone Flagyl    ASSESSMENT:    61 y.o. female admitted with:  Diabetic foot wound: Secondary to puncture wound to the plantar aspect of the right foot and complicated by cellulitis and loculated abscess.  Status post incision and drainage of multiple fascial planes on 03/29/2022 by podiatry.  Per the operative note, there was purulent drainage emanating from the plantar midfoot that penetrated throughout the plantar fat pad as well as the deep to the plantar fascia.  There did not appear to be any bone involvement and MRI was negative for osteomyelitis.  Her operative cultures are polymicrobial with group B strep, E. coli, Klebsiella oxytoca, and Prevotella. Sepsis: Secondary to #1. Uncontrolled diabetes: Hemoglobin A1c 10.3.  Presented with DKA this admission in setting of severe infection. History of kidney stones.  RECOMMENDATIONS:    Adjust antibiotics to Unasyn to cover all isolated organisms Follow-up plans for repeat debridement tomorrow 8/2 ABIs were reassuring without any evidence of significant PAD Wound care, glycemic control, lab monitoring Will follow   Principal Problem:   DKA (diabetic ketoacidosis) ([place]) Active Problems:   Diabetic foot infection ([place])   Hyperlipidemia   Hypertension   History of kidney stones   Anxiety and depression   Hypercalcemia   Puncture wound of plantar aspect of foot   Acute UTI (urinary tract infection)   Abscess of right foot    MEDICATIONS:    Scheduled Meds:  Chlorhexidine Gluconate Cloth  6 each Topical Q0600   enoxaparin (LOVENOX) injection  40 mg Subcutaneous Q24H   feeding supplement (GLUCERNA SHAKE)  237 mL Oral BID BM   Gerhardt's butt cream   Topical BID   insulin aspart  0-15 Units Subcutaneous TID WC   insulin glargine-yfgn  42 Units  Subcutaneous Daily   multivitamin with minerals  1 tablet Oral Daily   Continuous Infusions:  sodium chloride Stopped (03/31/22 0425)   sodium chloride     ampicillin-sulbactam (UNASYN) IV 3 g (04/02/22 1220)   promethazine (PHENERGAN) injection (IM or IVPB) 12.5 mg (04/02/22 0533)   PRN Meds:.sodium chloride, sodium chloride, acetaminophen **OR** acetaminophen, alum & mag hydroxide-simeth, dextrose, guaiFENesin, hydrALAZINE, ipratropium-albuterol, lip balm, loperamide, metoprolol tartrate, ondansetron **OR** ondansetron (ZOFRAN) IV, mouth rinse, oxyCODONE, promethazine (PHENERGAN) injection (IM or IVPB), senna-docusate, zolpidem  SUBJECTIVE:   24 hour events:  ABI done with no significant PAD Podiatry planning for repeat debridement Wednesday Cultures with GBS, E coli, Kleb oxytoca, E faecalis, likely anaerobe No new imaging Glucose 120-220 Febrile overnight Tmax 102.7   She reports plan for OR on Wednesday with podiatry.  States she feels okay otherwise.  She is having some bowel incontinence that improved with Imodium.  Review of Systems  All other systems reviewed and are negative.     OBJECTIVE:   Blood pressure 136/68, pulse 98, temperature 99.8 F (37.7 C), temperature source Oral, resp. rate 18, height '5\' 7"'$  (1.702 m), weight 90.9 kg, SpO2 97 %. Body mass index is 31.39 kg/m.  Physical Exam Constitutional:      General: She is not in acute distress.    Appearance: Normal appearance.  HENT:     Head: Normocephalic and atraumatic.  Eyes:     Extraocular Movements: Extraocular movements intact.     Conjunctiva/sclera: Conjunctivae normal.  Pulmonary:  Effort: Pulmonary effort is normal. No respiratory distress.  Abdominal:     General: There is no distension.     Palpations: Abdomen is soft.  Musculoskeletal:     Cervical back: Normal range of motion and neck supple.  Skin:    General: Skin is warm and dry.  Neurological:     General: No focal deficit  present.     Mental Status: She is alert and oriented to person, place, and time.  Psychiatric:        Mood and Affect: Mood normal.        Behavior: Behavior normal.      Lab Results: Lab Results  Component Value Date   WBC 14.9 (H) 04/02/2022   HGB 11.2 (L) 04/02/2022   HCT 34.5 (L) 04/02/2022   MCV 90.8 04/02/2022   PLT 238 04/02/2022    Lab Results  Component Value Date   NA 140 04/02/2022   K 3.6 04/02/2022   CO2 25 04/02/2022   GLUCOSE 123 (H) 04/02/2022   BUN 5 (L) 04/02/2022   CREATININE 0.38 (L) 04/02/2022   CALCIUM 8.4 (L) 04/02/2022   GFRNONAA >60 04/02/2022   GFRAA >60 10/28/2016    Lab Results  Component Value Date   ALT 14 03/30/2022   AST 11 (L) 03/30/2022   ALKPHOS 61 03/30/2022   BILITOT 0.6 03/30/2022       Component Value Date/Time   CRP 20.6 (H) 03/29/2022 1718       Component Value Date/Time   ESRSEDRATE 80 (H) 03/29/2022 1718     I have reviewed the micro and lab results in Epic.  Imaging: VAS [place] ABI WITH/WO TBI  Result Date: 04/02/2022  LOWER EXTREMITY DOPPLER STUDY Patient Name:  JAKERRIA KINGBIRD  Date of Exam:   04/01/2022 Medical Rec #: [id number]                 Accession #:    [id number] Date of Birth: 12-30-1960                Patient Gender: F Patient Age:   65 years Exam Location:  [facility] Procedure:      VAS [place] ABI WITH/WO TBI Referring Phys: ADAM MCDONALD --------------------------------------------------------------------------------  Indications: Ulceration. High Risk Factors: Hypertension, hyperlipidemia, Diabetes.  Comparison Study: No prior studies. Performing Technologist: Carlos Levering RVT  Examination Guidelines: A complete evaluation includes at minimum, Doppler waveform signals and systolic blood pressure reading at the level of bilateral brachial, anterior tibial, and posterior tibial arteries, when vessel segments are accessible. Bilateral testing is considered an integral part of a complete  examination. Photoelectric Plethysmograph (PPG) waveforms and toe systolic pressure readings are included as required and additional duplex testing as needed. Limited examinations for reoccurring indications may be performed as noted.  ABI Findings: +---------+------------------+-----+-----------+--------+ Right    Rt Pressure (mmHg)IndexWaveform   Comment  +---------+------------------+-----+-----------+--------+ Brachial 122                    triphasic           +---------+------------------+-----+-----------+--------+ PTA      144               1.04 multiphasic         +---------+------------------+-----+-----------+--------+ DP       137               0.99 multiphasic         +---------+------------------+-----+-----------+--------+ Silvano Bilis  0.25                     +---------+------------------+-----+-----------+--------+ +---------+------------------+-----+---------+-------+ Left     Lt Pressure (mmHg)IndexWaveform Comment +---------+------------------+-----+---------+-------+ Brachial 138                    triphasic        +---------+------------------+-----+---------+-------+ PTA      152               1.10 triphasic        +---------+------------------+-----+---------+-------+ DP       150               1.09 triphasic        +---------+------------------+-----+---------+-------+ Great Toe99                0.72                  +---------+------------------+-----+---------+-------+ +-------+-----------+-----------+------------+------------+ ABI/TBIToday's ABIToday's TBIPrevious ABIPrevious TBI +-------+-----------+-----------+------------+------------+ Right  1.04       0.25                                +-------+-----------+-----------+------------+------------+ Left   1.1        0.72                                +-------+-----------+-----------+------------+------------+  Summary: Right: Resting right  ankle-brachial index is within normal range. No evidence of significant right lower extremity arterial disease. The right toe-brachial index is abnormal. Left: Resting left ankle-brachial index is within normal range. No evidence of significant left lower extremity arterial disease. The left toe-brachial index is normal. *See table(s) above for measurements and observations.  Electronically signed by Orlie Pollen on 04/02/2022 at 12:47:19 PM.    Final      Imaging  independently reviewed in Epic.    [facility] for Infectious [facility] [phone] pager 04/02/2022, 2:28 PM

## 2022-04-03 ENCOUNTER — Inpatient Hospital Stay (HOSPITAL_COMMUNITY): Payer: BC Managed Care – PPO | Admitting: Anesthesiology

## 2022-04-03 ENCOUNTER — Encounter (HOSPITAL_COMMUNITY): Payer: Self-pay | Admitting: Internal Medicine

## 2022-04-03 ENCOUNTER — Other Ambulatory Visit: Payer: Self-pay

## 2022-04-03 ENCOUNTER — Encounter (HOSPITAL_COMMUNITY)
Admission: EM | Disposition: A | Discharge status: Home Health | Payer: Self-pay | Source: Home / Self Care | Attending: Internal Medicine

## 2022-04-03 DIAGNOSIS — E11628 Type 2 diabetes mellitus with other skin complications: Secondary | ICD-10-CM | POA: Diagnosis not present

## 2022-04-03 DIAGNOSIS — F419 Anxiety disorder, unspecified: Secondary | ICD-10-CM | POA: Diagnosis not present

## 2022-04-03 DIAGNOSIS — E1110 Type 2 diabetes mellitus with ketoacidosis without coma: Secondary | ICD-10-CM | POA: Diagnosis not present

## 2022-04-03 DIAGNOSIS — L02611 Cutaneous abscess of right foot: Secondary | ICD-10-CM

## 2022-04-03 DIAGNOSIS — N390 Urinary tract infection, site not specified: Secondary | ICD-10-CM | POA: Diagnosis not present

## 2022-04-03 HISTORY — PX: I & D EXTREMITY: SHX5045

## 2022-04-03 LAB — CBC
HCT: 32 % — ABNORMAL LOW (ref 36.0–46.0)
Hemoglobin: 10.7 g/dL — ABNORMAL LOW (ref 12.0–15.0)
MCH: 29.6 pg (ref 26.0–34.0)
MCHC: 33.4 g/dL (ref 30.0–36.0)
MCV: 88.6 fL (ref 80.0–100.0)
Platelets: 214 10*3/uL (ref 150–400)
RBC: 3.61 MIL/uL — ABNORMAL LOW (ref 3.87–5.11)
RDW: 13 % (ref 11.5–15.5)
WBC: 15.1 10*3/uL — ABNORMAL HIGH (ref 4.0–10.5)
nRBC: 0 % (ref 0.0–0.2)

## 2022-04-03 LAB — MAGNESIUM: Magnesium: 1.9 mg/dL (ref 1.7–2.4)

## 2022-04-03 LAB — BASIC METABOLIC PANEL
Anion gap: 7 (ref 5–15)
BUN: 5 mg/dL — ABNORMAL LOW (ref 6–20)
CO2: 27 mmol/L (ref 22–32)
Calcium: 8 mg/dL — ABNORMAL LOW (ref 8.9–10.3)
Chloride: 104 mmol/L (ref 98–111)
Creatinine, Ser: 0.38 mg/dL — ABNORMAL LOW (ref 0.44–1.00)
GFR, Estimated: >60 mL/min (ref >60)
Glucose, Bld: 89 mg/dL (ref 70–99)
Potassium: 3.3 mmol/L — ABNORMAL LOW (ref 3.5–5.1)
Sodium: 138 mmol/L (ref 135–145)

## 2022-04-03 LAB — GLUCOSE, CAPILLARY
Glucose-Capillary: 98 mg/dL (ref 70–99)
Glucose-Capillary: 88 mg/dL (ref 70–99)
Glucose-Capillary: 108 mg/dL — ABNORMAL HIGH (ref 70–99)
Glucose-Capillary: 126 mg/dL — ABNORMAL HIGH (ref 70–99)
Glucose-Capillary: 115 mg/dL — ABNORMAL HIGH (ref 70–99)
Glucose-Capillary: 221 mg/dL — ABNORMAL HIGH (ref 70–99)

## 2022-04-03 SURGERY — IRRIGATION AND DEBRIDEMENT EXTREMITY
Anesthesia: Monitor Anesthesia Care | Laterality: Right

## 2022-04-03 MED ORDER — BUPIVACAINE HCL (PF) 0.5 % IJ SOLN
INTRAMUSCULAR | Status: AC
  Filled 2022-04-03: qty 30

## 2022-04-03 MED ORDER — DEXAMETHASONE SODIUM PHOSPHATE 10 MG/ML IJ SOLN
INTRAMUSCULAR | Status: AC
  Filled 2022-04-03: qty 1

## 2022-04-03 MED ORDER — FENTANYL CITRATE (PF) 100 MCG/2ML IJ SOLN
INTRAMUSCULAR | Status: AC
  Filled 2022-04-03: qty 2

## 2022-04-03 MED ORDER — ACETAMINOPHEN 10 MG/ML IV SOLN
INTRAVENOUS | Status: AC
  Filled 2022-04-03: qty 100

## 2022-04-03 MED ORDER — PROPOFOL 10 MG/ML IV BOLUS
INTRAVENOUS | Status: AC
  Filled 2022-04-03: qty 20

## 2022-04-03 MED ORDER — 0.9 % SODIUM CHLORIDE (POUR BTL) OPTIME
TOPICAL | Status: DC | PRN
  Administered 2022-04-03: 1000 mL

## 2022-04-03 MED ORDER — ACETAMINOPHEN 500 MG PO TABS: 1000.0000 mg | ORAL_TABLET | Freq: Once | ORAL | Status: DC | PRN

## 2022-04-03 MED ORDER — MIDAZOLAM HCL 5 MG/5ML IJ SOLN
INTRAMUSCULAR | Status: DC | PRN
  Administered 2022-04-03: 2 mg via INTRAVENOUS

## 2022-04-03 MED ORDER — FENTANYL CITRATE (PF) 100 MCG/2ML IJ SOLN
INTRAMUSCULAR | Status: DC | PRN
  Administered 2022-04-03: 100 ug via INTRAVENOUS

## 2022-04-03 MED ORDER — ONDANSETRON HCL 4 MG/2ML IJ SOLN
INTRAMUSCULAR | Status: AC
  Filled 2022-04-03: qty 2

## 2022-04-03 MED ORDER — VANCOMYCIN HCL 1000 MG IV SOLR
INTRAVENOUS | Status: AC
  Filled 2022-04-03: qty 20

## 2022-04-03 MED ORDER — POTASSIUM CHLORIDE 10 MEQ/100ML IV SOLN
10.0000 meq | INTRAVENOUS | Status: AC
  Administered 2022-04-03 (×3): 10 meq via INTRAVENOUS
  Filled 2022-04-03 (×3): qty 100

## 2022-04-03 MED ORDER — ACETAMINOPHEN 10 MG/ML IV SOLN: 1000.0000 mg | Freq: Once | INTRAVENOUS | Status: DC | PRN

## 2022-04-03 MED ORDER — PROPOFOL 500 MG/50ML IV EMUL
INTRAVENOUS | Status: AC
Start: 2022-04-03 — End: ?
  Filled 2022-04-03: qty 50

## 2022-04-03 MED ORDER — ACETAMINOPHEN 160 MG/5ML PO SOLN: 1000.0000 mg | Freq: Once | ORAL | Status: DC | PRN

## 2022-04-03 MED ORDER — ACETAMINOPHEN 10 MG/ML IV SOLN: 1000.0000 mg | Freq: Once | INTRAVENOUS | Status: DC

## 2022-04-03 MED ORDER — DEXTROSE-NACL 5-0.45 % IV SOLN: INTRAVENOUS | Status: DC

## 2022-04-03 MED ORDER — ONDANSETRON HCL 4 MG/2ML IJ SOLN
INTRAMUSCULAR | Status: DC | PRN
  Administered 2022-04-03: 4 mg via INTRAVENOUS

## 2022-04-03 MED ORDER — LIDOCAINE HCL 2 % IJ SOLN
INTRAMUSCULAR | Status: AC
  Filled 2022-04-03: qty 20

## 2022-04-03 MED ORDER — OXYCODONE HCL 5 MG PO TABS: 5.0000 mg | ORAL_TABLET | Freq: Once | ORAL | Status: DC | PRN

## 2022-04-03 MED ORDER — LACTATED RINGERS IV SOLN: INTRAVENOUS | Status: DC | PRN

## 2022-04-03 MED ORDER — VANCOMYCIN HCL 1000 MG IV SOLR
INTRAVENOUS | Status: DC | PRN
  Administered 2022-04-03: 1000 mg via TOPICAL

## 2022-04-03 MED ORDER — OXYCODONE HCL 5 MG/5ML PO SOLN: 5.0000 mg | Freq: Once | ORAL | Status: DC | PRN

## 2022-04-03 MED ORDER — FENTANYL CITRATE PF 50 MCG/ML IJ SOSY: 25.0000 ug | PREFILLED_SYRINGE | INTRAMUSCULAR | Status: DC | PRN

## 2022-04-03 MED ORDER — PROPOFOL 500 MG/50ML IV EMUL
INTRAVENOUS | Status: DC | PRN
  Administered 2022-04-03: 100 ug/kg/min via INTRAVENOUS

## 2022-04-03 MED ORDER — MIDAZOLAM HCL 2 MG/2ML IJ SOLN
INTRAMUSCULAR | Status: AC
  Filled 2022-04-03: qty 2

## 2022-04-03 SURGICAL SUPPLY — 55 items
BAG COUNTER SPONGE SURGICOUNT (BAG) ×1 IMPLANT
BNDG ELASTIC 3X5.8 VLCR STR LF (GAUZE/BANDAGES/DRESSINGS) IMPLANT
BNDG ELASTIC 4X5.8 VLCR STR LF (GAUZE/BANDAGES/DRESSINGS) ×2 IMPLANT
BNDG ESMARK 4X9 LF (GAUZE/BANDAGES/DRESSINGS) IMPLANT
BNDG GAUZE DERMACEA FLUFF (GAUZE/BANDAGES/DRESSINGS) ×1
BNDG GAUZE DERMACEA FLUFF 4 (GAUZE/BANDAGES/DRESSINGS) ×2 IMPLANT
CANISTER WOUNDNEG PRESSURE 500 (CANNISTER) ×1 IMPLANT
CHLORAPREP W/TINT 26 (MISCELLANEOUS) ×1 IMPLANT
COVER MAYO STAND STRL (DRAPES) ×2 IMPLANT
CUFF TOURN SGL QUICK 34 (TOURNIQUET CUFF)
CUFF TRNQT CYL 34X4.125X (TOURNIQUET CUFF) IMPLANT
DRAPE IMP U-DRAPE 54X76 (DRAPES) ×2 IMPLANT
DRAPE SURG 17X23 STRL (DRAPES) IMPLANT
DRAPE U-SHAPE 47X51 STRL (DRAPES) ×2 IMPLANT
DRSG EMULSION OIL 3X16 NADH (GAUZE/BANDAGES/DRESSINGS) ×1 IMPLANT
DRSG EMULSION OIL 3X3 NADH (GAUZE/BANDAGES/DRESSINGS) ×1 IMPLANT
DRSG VAC ATS MED SENSATRAC (GAUZE/BANDAGES/DRESSINGS) ×1 IMPLANT
DRSG VAC ATS SM SENSATRAC (GAUZE/BANDAGES/DRESSINGS) IMPLANT
GAUZE PACKING IODOFORM 1/2 (PACKING) ×1 IMPLANT
GAUZE SPONGE 4X4 12PLY STRL (GAUZE/BANDAGES/DRESSINGS) ×2 IMPLANT
GLOVE BIO SURGEON STRL SZ7 (GLOVE) ×2 IMPLANT
GLOVE BIOGEL PI IND STRL 7.5 (GLOVE) ×1 IMPLANT
GLOVE BIOGEL PI INDICATOR 7.5 (GLOVE) ×1
GOWN STRL REUS W/ TWL XL LVL3 (GOWN DISPOSABLE) IMPLANT
GOWN STRL REUS W/TWL XL LVL3 (GOWN DISPOSABLE)
HANDPIECE INTERPULSE COAX TIP (DISPOSABLE) ×2
KIT BASIN OR (CUSTOM PROCEDURE TRAY) ×2 IMPLANT
MICROMATRIX 1000MG (Tissue) ×2 IMPLANT
NDL BIOPSY JAMSHIDI 11X6 (NEEDLE) IMPLANT
NDL HYPO 25X1 1.5 SAFETY (NEEDLE) ×1 IMPLANT
NDL SAFETY ECLIPSE 18X1.5 (NEEDLE) IMPLANT
NEEDLE BIOPSY JAMSHIDI 11X6 (NEEDLE) IMPLANT
NEEDLE HYPO 18GX1.5 SHARP (NEEDLE)
NEEDLE HYPO 25X1 1.5 SAFETY (NEEDLE) ×2 IMPLANT
NS IRRIG 1000ML POUR BTL (IV SOLUTION) ×2 IMPLANT
PACK ORTHO EXTREMITY (CUSTOM PROCEDURE TRAY) ×2 IMPLANT
PADDING CAST ABS 4INX4YD NS (CAST SUPPLIES) ×1
PADDING CAST ABS COTTON 4X4 ST (CAST SUPPLIES) ×2 IMPLANT
PENCIL SMOKE EVACUATOR (MISCELLANEOUS) ×2 IMPLANT
SET HNDPC FAN SPRY TIP SCT (DISPOSABLE) IMPLANT
SOLUTION PARTIC MCRMTRX 1000MG (Tissue) IMPLANT
STAPLER VISISTAT 35W (STAPLE) IMPLANT
STOCKINETTE 6  STRL (DRAPES)
STOCKINETTE 6 STRL (DRAPES) IMPLANT
SUT MNCRL AB 3-0 PS2 18 (SUTURE) IMPLANT
SUT MNCRL AB 4-0 PS2 18 (SUTURE) IMPLANT
SUT MON AB 5-0 PS2 18 (SUTURE) IMPLANT
SUT PROLENE 4 0 PS 2 18 (SUTURE) IMPLANT
SUT VIC AB 4-0 P2 18 (SUTURE) IMPLANT
SWAB COLLECTION DEVICE MRSA (MISCELLANEOUS) ×2 IMPLANT
SWAB CULTURE ESWAB REG 1ML (MISCELLANEOUS) ×2 IMPLANT
SYR CONTROL 10ML LL (SYRINGE) IMPLANT
TOWEL OR 17X26 10 PK STRL BLUE (TOWEL DISPOSABLE) ×2 IMPLANT
UNDERPAD 30X36 HEAVY ABSORB (UNDERPADS AND DIAPERS) ×3 IMPLANT
YANKAUER SUCT BULB TIP 10FT TU (MISCELLANEOUS) ×2 IMPLANT

## 2022-04-03 NOTE — Transfer of Care (Signed)
Immediate Anesthesia Transfer of Care Note  Patient: Courtney Parks  Procedure(s) Performed: IRRIGATION AND DEBRIDEMENT RIGHT FOOT WITH WOUND VAC (Right)  Patient Location: PACU  Anesthesia Type:MAC  Level of Consciousness: awake, alert , oriented and patient cooperative  Airway & Oxygen Therapy: Patient Spontanous Breathing and Patient connected to nasal cannula oxygen  Post-op Assessment: Report given to RN, Post -op Vital signs reviewed and stable and Patient moving all extremities X 4  Post vital signs: Reviewed and stable  Last Vitals:  Vitals Value Taken Time  BP 103/52 04/03/22 1655  Temp 36.7 C 04/03/22 1655  Pulse 77 04/03/22 1700  Resp 24 04/03/22 1700  SpO2 93 % 04/03/22 1700  Vitals shown include unvalidated device data.  Last Pain:  Vitals:   04/03/22 1655  TempSrc:   PainSc: Asleep      Patients Stated Pain Goal: 0 (58/48/35 0757)  Complications: No notable events documented.

## 2022-04-03 NOTE — Op Note (Signed)
Surgeon: Surgeon(s): Felipa Furnace, DPM  Assistants: None Pre-operative diagnosis: RIGHT FOOT ABSCESS  Post-operative diagnosis: same Procedure: Procedure(s) (LRB): IRRIGATION AND DEBRIDEMENT RIGHT FOOT WITH WOUND VAC (Right)  Pathology:  ID Type Source Tests Collected by Time Destination  A : right foot pulse lavage Abscess Foot, Right AEROBIC/ANAEROBIC CULTURE W GRAM STAIN (SURGICAL/DEEP WOUND) Felipa Furnace, DPM 04/03/2022 1639     Pertinent Intra-op findings: Some purulent drainage was expressed.  Post lavage there was no further purulent drainage.  Adequate bleeding.  Post lavage wound culture was taken Anesthesia: General  Hemostasis: None EBL: 50 cc Materials: Negative pressure wound therapy vancomycin powder Injectables: None Complications: None  Indications for surgery: A 61 y.o. female presents with right foot abscess status post incision and drainage washout debridement now returning to the OR first stage II of a two-stage procedure. Patient has failed all conservative therapy including but not limited to local wound care antibiotics. She wishes to have surgical correction of the foot/deformity. It was determined that patient would benefit from right foot incision and drainage washout debridement with application of negative pressure wound therapy. Informed surgical risk consent was reviewed and read aloud to the patient.  I reviewed the films.  I have discussed my findings with the patient in great detail.  I have discussed all risks including but not limited to infection, stiffness, scarring, limp, disability, deformity, damage to blood vessels and nerves, numbness, poor healing, need for braces, arthritis, chronic pain, amputation, death.  All benefits and realistic expectations discussed in great detail.  I have made no promises as to the outcome.  I have provided realistic expectations.  I have offered the patient a 2nd opinion, which they have declined and assured me they  preferred to proceed despite the risks   Procedure in detail: The patient was both verbally and visually identified by myself, the nursing staff, and anesthesia staff in the preoperative holding area. They were then transferred to the operating room and placed on the operative table in supine position.  Attention was directed to the previous incision.  All tissue planes were decompressed adequately.  Further purulent drainage was noted.  All purulent drainage was thoroughly expressed.  Rongeur was used to debride devitalized tissue adequately.  Once the area appeared clear of infection the wound underwent pulse lavage with 3 L saline.  The wound was thoroughly irrigated.  Post lavage culture was taken in standard manner.  Measurements were taken which measured 6 cm x 4 cm.  At this time it was determined that patient will benefit from negative pressure wound therapy.  Vancomycin powder as well as ACell powder was embedded in the wound to help with granulation and keep the infection clear.  Negative pressure wound therapy was applied in standard technique.  Foot was wrapped with Kerlix and Ace bandage all bony prominences were adequately padded.  Disposition -Patient is okay to be discharged from podiatric standpoint.  Patient will need Monday Wednesday Friday wound VAC change.  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  Antibiotics per infectious disease.  Nonweightbearing to the right lower extremity.  Follow-up 1 week from discharge.    At the conclusion of the procedure the patient was awoken from anesthesia and found to have tolerated the procedure well any complications. There were transferred to PACU with vital signs stable and vascular status intact.  Boneta Lucks, DPM

## 2022-04-03 NOTE — Anesthesia Postprocedure Evaluation (Signed)
Anesthesia Post Note  Patient: Courtney Parks  Procedure(s) Performed: IRRIGATION AND DEBRIDEMENT RIGHT FOOT WITH WOUND VAC (Right)     Patient location during evaluation: PACU Anesthesia Type: MAC Level of consciousness: awake and alert Pain management: pain level controlled Vital Signs Assessment: post-procedure vital signs reviewed and stable Respiratory status: spontaneous breathing, nonlabored ventilation, respiratory function stable and patient connected to nasal cannula oxygen Cardiovascular status: stable and blood pressure returned to baseline Postop Assessment: no apparent nausea or vomiting Anesthetic complications: no   No notable events documented.  Last Vitals:  Vitals:   04/03/22 1803 04/03/22 1837  BP: 120/65 (!) 143/76  Pulse: 86 80  Resp: 18 18  Temp: 36.6 C 36.7 C  SpO2: 93% 94%    Last Pain:  Vitals:   04/03/22 1955  TempSrc:   PainSc: 5                  Malcolm Quast

## 2022-04-03 NOTE — Interval H&P Note (Signed)
History and Physical Interval Note:  04/03/2022 3:53 PM  [name]  has presented today for surgery, with the diagnosis of RIGHT FOOT ABSCESS.  The various methods of treatment have been discussed with the patient and family. After consideration of risks, benefits and other options for treatment, the patient has consented to  Procedure(s): IRRIGATION AND DEBRIDEMENT RIGHT FOOT WITH WOUND VAC (Right) as a surgical intervention.  The patient's history has been reviewed, patient examined, no change in status, stable for surgery.  I have reviewed the patient's chart and labs.  Questions were answered to the patient's satisfaction.     Felipa Furnace

## 2022-04-03 NOTE — Plan of Care (Signed)
Discussed with patient plan of care for the evening, pain management and night time medications with some teach back displayed.

## 2022-04-03 NOTE — Progress Notes (Signed)
PROGRESS NOTE    Courtney Parks  RKY:[id number] DOB: [birth year] DOA: 03/29/2022 PCP: Pcp, No   Brief Narrative:  61 year old with history of anxiety, depression, DM 2, HLD, HTN, lithiasis comes to the hospital with complaints of right lower quadrant abdominal pain and CVA tenderness on the right side ongoing for the past week along with fatigue.  UA was consistent with glucosuria and urinary tract infection.  CT abdomen pelvis showed nonobstructive left-sided nephrolithiasis and hepatic steatosis.  She was found to have puncture wound of her right foot and was in diabetic ketoacidosis. S/p I&D right foot now dressing changes Podiatry.  ID consulted, cultures reviewed and antibiotics consolidated to IV Unasyn.   Assessment & Plan:  Principal Problem:   DKA (diabetic ketoacidosis) ([place]) Active Problems:   Diabetic foot infection ([place])   Hyperlipidemia   Hypertension   History of kidney stones   Anxiety and depression   Hypercalcemia   Puncture wound of plantar aspect of foot   Acute UTI (urinary tract infection)   Abscess of right foot    DKA (diabetic ketoacidosis) ([place]) - resolved.  Insulin-dependent diabetes mellitus type 2 -Hemoglobin A1c 10.3.  DKA protocol stopped.  Anion gap closed this morning.  Adjust Semglee + ISS.  Semglee to 42 units.  Placed on D5 half-normal saline while NPO. - Consulted diabetic coordinator   Sepsis secondary to puncture wound of plantar aspect of right foot Cellulitis of the right foot with loculated abscess Status post I&D of the right foot Tmax 100.9 in last 24 hours.  Cultures reviewed and antibiotics consolidated to IV Unasyn.  Appreciate input from ID. -MRI shows small loculated abscess -Seen by podiatry-plans for OR today ABIs - ok.      Hyperlipidemia LDL is 68, diet controlled.      Hypertension BP is stable for the most part.      Acute UTI (urinary tract infection)   History of kidney stones.  Left-sided nonobstructive  stone Now on IV Unasyn     Anxiety and depression Ambien at bedtime while in the hospital. Follow-up with [facility] and/or PCP.     Hypercalcemia Likely from dehydration, resolved  Hypokalemia/hypophosphatemia - Repletion as needed     DVT prophylaxis: enoxaparin (LOVENOX) injection 40 mg Start: 03/29/22 1800 Code Status: Full code Family Communication: Son at bedside  On IV Abx, cont hosp stay  Subjective: Seen and examined at bedside, no complaints.  Remains afebrile overnight.  Examination: Constitutional: Not in acute distress Respiratory: Clear to auscultation bilaterally Cardiovascular: Normal sinus rhythm, no rubs Abdomen: Nontender nondistended good bowel sounds Musculoskeletal: No edema noted Skin: Right lower extremity dressing in place Neurologic: CN 2-12 grossly intact.  And nonfocal Psychiatric: Normal judgment and insight. Alert and oriented x 3. Normal mood.  Objective: Vitals:   04/02/22 2334 04/03/22 0132 04/03/22 0405 04/03/22 0629  BP: 116/61 120/63 93/69 (!) 143/66  Pulse: 78 76 86 94  Resp: '18 16 18 20  '$ Temp: 98.7 F (37.1 C) 98.2 F (36.8 C) 98.9 F (37.2 C) 98.8 F (37.1 C)  TempSrc: Oral Oral Oral Oral  SpO2: 98% 90% 92% 90%  Weight:      Height:        Intake/Output Summary (Last 24 hours) at 04/03/2022 0818 Last data filed at 04/03/2022 0347 Gross per 24 hour  Intake 998.15 ml  Output --  Net 998.15 ml   Filed Weights   03/29/22 0614 03/31/22 0629  Weight: 81.2 kg 90.9 kg  Data Reviewed:   CBC: Recent Labs  Lab 03/29/22 0626 03/29/22 0656 03/30/22 0259 03/31/22 0246 04/01/22 0520 04/02/22 0451 04/03/22 0446  WBC 20.3*  --  14.6* 14.5* 14.0* 14.9* 15.1*  NEUTROABS 16.9*  --   --   --   --   --   --   HGB 14.0   < > 11.0* 11.1* 11.7* 11.2* 10.7*  HCT 40.6   < > 32.9* 33.0* 36.0 34.5* 32.0*  MCV 89.2  --  89.2 89.7 92.3 90.8 88.6  PLT 244  --  205 185 218 238 214   < > = values in this interval not displayed.   Basic  Metabolic Panel: Recent Labs  Lab 03/30/22 0259 03/30/22 1204 03/31/22 0246 04/01/22 0520 04/02/22 0451 04/03/22 0446  NA 140  --  137 136 140 138  K 3.0*  --  3.3* 3.6 3.6 3.3*  CL 111  --  109 107 108 104  CO2 22  --  21* 21* 25 27  GLUCOSE 188*  --  248* 197* 123* 89  BUN 9  --  7 8 5* 5*  CREATININE 0.37*  --  0.49 0.37* 0.38* 0.38*  CALCIUM 8.7*  --  8.5* 8.5* 8.4* 8.0*  MG 1.7  --  1.8 1.9 2.1 1.9  PHOS 1.3* 1.0*  --  2.5  --   --    GFR: Estimated Creatinine Clearance: 86.5 mL/min (A) (by C-G formula based on SCr of 0.38 mg/dL (L)). Liver Function Tests: Recent Labs  Lab 03/29/22 0626 03/30/22 0259  AST 16 11*  ALT 16 14  ALKPHOS 76 61  BILITOT 0.8 0.6  PROT 7.5 5.7*  ALBUMIN 4.3 2.6*   Recent Labs  Lab 03/29/22 0626  LIPASE 16   No results for input(s): "AMMONIA" in the last 168 hours. Coagulation Profile: No results for input(s): "INR", "PROTIME" in the last 168 hours. Cardiac Enzymes: No results for input(s): "CKTOTAL", "CKMB", "CKMBINDEX", "TROPONINI" in the last 168 hours. BNP (last 3 results) No results for input(s): "PROBNP" in the last 8760 hours. HbA1C: No results for input(s): "HGBA1C" in the last 72 hours.  CBG: Recent Labs  Lab 04/02/22 1155 04/02/22 1644 04/02/22 2335 04/03/22 0554 04/03/22 0743  GLUCAP 190* 156* 96 98 88   Lipid Profile: Recent Labs    04/02/22 0451  CHOL 102  HDL 18*  LDLCALC 68  TRIG 82  CHOLHDL 5.7   Thyroid Function Tests: No results for input(s): "TSH", "T4TOTAL", "FREET4", "T3FREE", "THYROIDAB" in the last 72 hours. Anemia Panel: No results for input(s): "VITAMINB12", "FOLATE", "FERRITIN", "TIBC", "IRON", "RETICCTPCT" in the last 72 hours. Sepsis Labs: Recent Labs  Lab 04/02/22 0825 04/02/22 1019  LATICACIDVEN 0.8 1.4    Recent Results (from the past 240 hour(s))  Urine Culture     Status: Abnormal   Collection Time: 03/29/22  8:35 AM   Specimen: Urine, Clean Catch  Result Value Ref Range  Status   Specimen Description   Final    URINE, CLEAN CATCH Performed at [facility], [address] [place], [place] [address]    Special Requests   Final    NONE Performed at [facility], [address] [place], [place] [address]    Culture MULTIPLE SPECIES PRESENT, SUGGEST RECOLLECTION (A)  Final   Report Status 03/30/2022 FINAL  Final  MRSA Next Gen by PCR, Nasal     Status: None   Collection Time: 03/29/22  2:04 PM   Specimen:  Nasal Mucosa; Nasal Swab  Result Value Ref Range Status   MRSA by PCR Next Gen NOT DETECTED NOT DETECTED Final    Comment: (NOTE) The GeneXpert MRSA Assay (FDA approved for NASAL specimens only), is one component of a comprehensive MRSA colonization surveillance program. It is not intended to diagnose MRSA infection nor to guide or monitor treatment for MRSA infections. Test performance is not FDA approved in patients less than 46 years old. Performed at [facility], [place] [address] [place], [place] [address]   Aerobic/Anaerobic Culture w Gram Stain (surgical/deep wound)     Status: None   Collection Time: 03/30/22  8:37 AM   Specimen: PATH Other; Tissue  Result Value Ref Range Status   Specimen Description   Final    TISSUE Performed at [facility] [address], [place], [place] [address]    Special Requests   Final    TISSUE Performed at [facility], [place] [address] [place], [place] [address]    Gram Stain   Final    NO SQUAMOUS EPITHELIAL CELLS SEEN FEW WBC SEEN MODERATE GRAM POSITIVE COCCI MODERATE GRAM NEGATIVE RODS    Culture   Final    ABUNDANT KLEBSIELLA OXYTOCA ABUNDANT GROUP B STREP(S.AGALACTIAE)ISOLATED TESTING AGAINST S. AGALACTIAE NOT ROUTINELY PERFORMED DUE TO PREDICTABILITY OF AMP/PEN/VAN SUSCEPTIBILITY. ABUNDANT ENTEROCOCCUS FAECALIS ABUNDANT ESCHERICHIA COLI ABUNDANT PREVOTELLA BIVIA BETA LACTAMASE POSITIVE Performed  at [facility], [place] [address], [place], [place] [address]    Report Status 04/02/2022 FINAL  Final   Organism ID, Bacteria KLEBSIELLA OXYTOCA  Final   Organism ID, Bacteria ENTEROCOCCUS FAECALIS  Final   Organism ID, Bacteria ESCHERICHIA COLI  Final      Susceptibility   Escherichia coli - MIC*    AMPICILLIN 4 SENSITIVE Sensitive     CEFAZOLIN <=4 SENSITIVE Sensitive     CEFEPIME <=0.12 SENSITIVE Sensitive     CEFTAZIDIME <=1 SENSITIVE Sensitive     CEFTRIAXONE <=0.25 SENSITIVE Sensitive     CIPROFLOXACIN <=0.25 SENSITIVE Sensitive     GENTAMICIN <=1 SENSITIVE Sensitive     IMIPENEM <=0.25 SENSITIVE Sensitive     TRIMETH/SULFA <=20 SENSITIVE Sensitive     AMPICILLIN/SULBACTAM <=2 SENSITIVE Sensitive     PIP/TAZO <=4 SENSITIVE Sensitive     * ABUNDANT ESCHERICHIA COLI   Enterococcus faecalis - MIC*    AMPICILLIN <=2 SENSITIVE Sensitive     VANCOMYCIN 2 SENSITIVE Sensitive     GENTAMICIN SYNERGY SENSITIVE Sensitive     * ABUNDANT ENTEROCOCCUS FAECALIS   Klebsiella oxytoca - MIC*    AMPICILLIN RESISTANT Resistant     CEFAZOLIN <=4 SENSITIVE Sensitive     CEFEPIME <=0.12 SENSITIVE Sensitive     CEFTAZIDIME <=1 SENSITIVE Sensitive     CEFTRIAXONE <=0.25 SENSITIVE Sensitive     CIPROFLOXACIN <=0.25 SENSITIVE Sensitive     GENTAMICIN <=1 SENSITIVE Sensitive     IMIPENEM <=0.25 SENSITIVE Sensitive     TRIMETH/SULFA <=20 SENSITIVE Sensitive     AMPICILLIN/SULBACTAM 4 SENSITIVE Sensitive     PIP/TAZO <=4 SENSITIVE Sensitive     * ABUNDANT KLEBSIELLA OXYTOCA  Culture, blood (Routine X 2) w Reflex to ID Panel     Status: None (Preliminary result)   Collection Time: 04/01/22  5:45 PM   Specimen: BLOOD  Result Value Ref Range Status   Specimen Description   Final    BLOOD BLOOD RIGHT HAND Performed at [facility], [address] [place], [place]  [address]    Special Requests   Final    IN PEDIATRIC BOTTLE Blood Culture adequate volume Performed at [facility] [address], [place], [place] [address]    Culture   Final    NO GROWTH < 24 HOURS Performed at [facility] [address], [place], [place] [address]    Report Status PENDING  Incomplete  Culture, blood (Routine X 2) w Reflex to ID Panel     Status: None (Preliminary result)   Collection Time: 04/01/22  5:45 PM   Specimen: BLOOD  Result Value Ref Range Status   Specimen Description   Final    BLOOD BLOOD LEFT HAND Performed at [facility] [address] [place], [place] [address]    Special Requests   Final    IN PEDIATRIC BOTTLE Blood Culture adequate volume Performed at [facility] [address], [place], [place] [address]    Culture   Final    NO GROWTH < 24 HOURS Performed at [facility] [address] [place], [place] [address]    Report Status PENDING  Incomplete         Radiology Studies: VAS [place] ABI WITH/WO TBI  Result Date: 04/02/2022  LOWER EXTREMITY DOPPLER STUDY Patient Name:  GHADA ABBETT  Date of Exam:   04/01/2022 Medical Rec #: [id number]                 Accession #:    [id number] Date of Birth: Jan 28, 1961                Patient Gender: F Patient Age:   33 years Exam Location:  [facility] Procedure:      VAS [place] ABI WITH/WO TBI Referring Phys: ADAM MCDONALD --------------------------------------------------------------------------------  Indications: Ulceration. High Risk Factors: Hypertension, hyperlipidemia, Diabetes.  Comparison Study: No prior studies. Performing Technologist: Carlos Levering RVT  Examination Guidelines: A complete evaluation includes at minimum, Doppler waveform signals and systolic blood pressure reading at the level of bilateral brachial, anterior tibial, and posterior tibial arteries, when vessel segments are accessible. Bilateral testing is considered an integral part of a complete examination. Photoelectric Plethysmograph (PPG) waveforms  and toe systolic pressure readings are included as required and additional duplex testing as needed. Limited examinations for reoccurring indications may be performed as noted.  ABI Findings: +---------+------------------+-----+-----------+--------+ Right    Rt Pressure (mmHg)IndexWaveform   Comment  +---------+------------------+-----+-----------+--------+ Brachial 122                    triphasic           +---------+------------------+-----+-----------+--------+ PTA      144               1.04 multiphasic         +---------+------------------+-----+-----------+--------+ DP       137               0.99 multiphasic         +---------+------------------+-----+-----------+--------+ Great Toe35                0.25                     +---------+------------------+-----+-----------+--------+ +---------+------------------+-----+---------+-------+ Left     Lt Pressure (mmHg)IndexWaveform Comment +---------+------------------+-----+---------+-------+ Brachial 138                    triphasic        +---------+------------------+-----+---------+-------+  PTA      152               1.10 triphasic        +---------+------------------+-----+---------+-------+ DP       150               1.09 triphasic        +---------+------------------+-----+---------+-------+ Great Toe99                0.72                  +---------+------------------+-----+---------+-------+ +-------+-----------+-----------+------------+------------+ ABI/TBIToday's ABIToday's TBIPrevious ABIPrevious TBI +-------+-----------+-----------+------------+------------+ Right  1.04       0.25                                +-------+-----------+-----------+------------+------------+ Left   1.1        0.72                                +-------+-----------+-----------+------------+------------+  Summary: Right: Resting right ankle-brachial index is within normal range. No evidence of  significant right lower extremity arterial disease. The right toe-brachial index is abnormal. Left: Resting left ankle-brachial index is within normal range. No evidence of significant left lower extremity arterial disease. The left toe-brachial index is normal. *See table(s) above for measurements and observations.  Electronically signed by Orlie Pollen on 04/02/2022 at 12:47:19 PM.    Final         Scheduled Meds:  Chlorhexidine Gluconate Cloth  6 each Topical Q0600   enoxaparin (LOVENOX) injection  40 mg Subcutaneous Q24H   feeding supplement (GLUCERNA SHAKE)  237 mL Oral BID BM   Gerhardt's butt cream   Topical BID   insulin aspart  0-15 Units Subcutaneous TID WC   insulin glargine-yfgn  42 Units Subcutaneous Daily   multivitamin with minerals  1 tablet Oral Daily   Continuous Infusions:  sodium chloride Stopped (03/31/22 0425)   sodium chloride     ampicillin-sulbactam (UNASYN) IV 3 g (04/03/22 0630)   potassium chloride     promethazine (PHENERGAN) injection (IM or IVPB) 12.5 mg (04/02/22 0533)     LOS: 4 days   Time spent= 35 mins    Kevron Patella Arsenio Loader, MD [facility]  If 7PM-7AM, please contact night-coverage  04/03/2022, 8:18 AM

## 2022-04-03 NOTE — Anesthesia Preprocedure Evaluation (Signed)
Anesthesia Evaluation  Patient identified by MRN, date of birth, ID band Patient awake    Reviewed: Allergy & Precautions, NPO status , Patient's Chart, lab work & pertinent test results  History of Anesthesia Complications Negative for: history of anesthetic complications  Airway Mallampati: II  TM Distance: >3 FB Neck ROM: Full    Dental  (+) Poor Dentition, Missing, Dental Advisory Given   Pulmonary neg pulmonary ROS, former smoker,    breath sounds clear to auscultation       Cardiovascular hypertension,  Rhythm:Regular     Neuro/Psych Anxiety Depression negative neurological ROS     GI/Hepatic negative GI ROS, Neg liver ROS,   Endo/Other  diabetes, Poorly Controlled, Type 2Lab Results      Component                Value               Date                      HGBA1C                   10.3 (H)            03/29/2022             Renal/GU negative Renal ROSLab Results      Component                Value               Date                      CREATININE               0.38 (L)            04/03/2022             negative genitourinary   Musculoskeletal negative musculoskeletal ROS (+)   Abdominal   Peds  Hematology  (+) Blood dyscrasia, anemia ,   Anesthesia Other Findings Right foot abscess  Reproductive/Obstetrics                             Anesthesia Physical Anesthesia Plan  ASA: 3  Anesthesia Plan: MAC   Post-op Pain Management: Tylenol PO (pre-op)*   Induction: Intravenous  PONV Risk Score and Plan: 2 and Ondansetron, Propofol infusion and Treatment may vary due to age or medical condition  Airway Management Planned: Natural Airway and Nasal Cannula  Additional Equipment: None  Intra-op Plan:   Post-operative Plan:   Informed Consent: I have reviewed the patients History and Physical, chart, labs and discussed the procedure including the risks, benefits and  alternatives for the proposed anesthesia with the patient or authorized representative who has indicated his/her understanding and acceptance.     Dental advisory given  Plan Discussed with: CRNA  Anesthesia Plan Comments:         Anesthesia Quick Evaluation

## 2022-04-04 ENCOUNTER — Encounter (HOSPITAL_COMMUNITY): Payer: Self-pay | Admitting: Podiatry

## 2022-04-04 DIAGNOSIS — E1110 Type 2 diabetes mellitus with ketoacidosis without coma: Secondary | ICD-10-CM | POA: Diagnosis not present

## 2022-04-04 DIAGNOSIS — E11628 Type 2 diabetes mellitus with other skin complications: Secondary | ICD-10-CM | POA: Diagnosis not present

## 2022-04-04 DIAGNOSIS — A419 Sepsis, unspecified organism: Secondary | ICD-10-CM

## 2022-04-04 DIAGNOSIS — F419 Anxiety disorder, unspecified: Secondary | ICD-10-CM | POA: Diagnosis not present

## 2022-04-04 DIAGNOSIS — L02611 Cutaneous abscess of right foot: Secondary | ICD-10-CM | POA: Diagnosis not present

## 2022-04-04 DIAGNOSIS — S91331A Puncture wound without foreign body, right foot, initial encounter: Secondary | ICD-10-CM | POA: Diagnosis not present

## 2022-04-04 DIAGNOSIS — N390 Urinary tract infection, site not specified: Secondary | ICD-10-CM | POA: Diagnosis not present

## 2022-04-04 LAB — BASIC METABOLIC PANEL
Anion gap: 13 (ref 5–15)
BUN: 8 mg/dL (ref 6–20)
CO2: 20 mmol/L — ABNORMAL LOW (ref 22–32)
Calcium: 8.1 mg/dL — ABNORMAL LOW (ref 8.9–10.3)
Chloride: 103 mmol/L (ref 98–111)
Creatinine, Ser: 0.51 mg/dL (ref 0.44–1.00)
GFR, Estimated: >60 mL/min (ref >60)
Glucose, Bld: 238 mg/dL — ABNORMAL HIGH (ref 70–99)
Potassium: 4.3 mmol/L (ref 3.5–5.1)
Sodium: 136 mmol/L (ref 135–145)

## 2022-04-04 LAB — CBC
HCT: 38.6 % (ref 36.0–46.0)
Hemoglobin: 12.4 g/dL (ref 12.0–15.0)
MCH: 30.2 pg (ref 26.0–34.0)
MCHC: 32.1 g/dL (ref 30.0–36.0)
MCV: 93.9 fL (ref 80.0–100.0)
Platelets: 279 10*3/uL (ref 150–400)
RBC: 4.11 MIL/uL (ref 3.87–5.11)
RDW: 13.1 % (ref 11.5–15.5)
WBC: 21.6 10*3/uL — ABNORMAL HIGH (ref 4.0–10.5)
nRBC: 0 % (ref 0.0–0.2)

## 2022-04-04 LAB — GLUCOSE, CAPILLARY
Glucose-Capillary: 241 mg/dL — ABNORMAL HIGH (ref 70–99)
Glucose-Capillary: 246 mg/dL — ABNORMAL HIGH (ref 70–99)
Glucose-Capillary: 247 mg/dL — ABNORMAL HIGH (ref 70–99)
Glucose-Capillary: 269 mg/dL — ABNORMAL HIGH (ref 70–99)

## 2022-04-04 LAB — MAGNESIUM: Magnesium: 2.2 mg/dL (ref 1.7–2.4)

## 2022-04-04 MED ORDER — INSULIN GLARGINE-YFGN 100 UNIT/ML ~~LOC~~ SOLN
46.0000 [IU] | Freq: Every day | SUBCUTANEOUS | Status: DC
  Administered 2022-04-04 – 2022-04-05 (×2): 46 [IU] via SUBCUTANEOUS
  Filled 2022-04-04 (×2): qty 0.46

## 2022-04-04 NOTE — Progress Notes (Signed)
[facility] for Infectious Disease  Date of Admission:  03/29/2022           Reason for visit: Follow up on diabetic foot infection  Current antibiotics: Unasyn    ASSESSMENT:    61 y.o. female admitted with:  Diabetic foot wound: Secondary to prior puncture wound to the plantar aspect of the right foot and complicated by cellulitis and loculated abscess.  Status post incision and drainage of multiple fascial planes on 03/29/2022 by podiatry with purulence noted to be emanating from the plantar midfoot that penetrated throughout the plantar fat pad as well as deep to the plantar fascia.  There did not appear to be any bone involvement and MRI was negative for osteomyelitis.  Operative cultures polymicrobial with group B strep, E. coli, Klebsiella, and Prevotella.  She returned to the OR 04/03/2022 with podiatry for repeat I&D where further purulent drainage was found.  Cultures were obtained and this is currently no growth to date.  ABIs were completed as well this admission which were reassuring without any evidence of significant PAD. Sepsis: Secondary to #1 and has improved following repeat I&D yesterday. Uncontrolled type 2 diabetes: Hemoglobin A1c 10.3 after she presented with DKA this admission in the setting of her severe infection.  RECOMMENDATIONS:    Continue Unasyn Wound care, glycemic control, lab monitoring Follow cultures If cultures remain as they are and she continues to do well, anticipate discharge on Augmentin for prolonged course of therapy likely 4 weeks. Will follow   Principal Problem:   DKA (diabetic ketoacidosis) ([place]) Active Problems:   Diabetic foot infection ([place])   Hyperlipidemia   Hypertension   History of kidney stones   Anxiety and depression   Hypercalcemia   Puncture wound of plantar aspect of foot   Acute UTI (urinary tract infection)   Abscess of right foot    MEDICATIONS:    Scheduled Meds:  Chlorhexidine Gluconate Cloth  6  each Topical Q0600   enoxaparin (LOVENOX) injection  40 mg Subcutaneous Q24H   feeding supplement (GLUCERNA SHAKE)  237 mL Oral BID BM   Gerhardt's butt cream   Topical BID   insulin aspart  0-15 Units Subcutaneous TID WC   insulin glargine-yfgn  46 Units Subcutaneous Daily   multivitamin with minerals  1 tablet Oral Daily   Continuous Infusions:  sodium chloride 10 mL/hr at 04/04/22 0630   sodium chloride     ampicillin-sulbactam (UNASYN) IV Stopped (04/04/22 0557)   promethazine (PHENERGAN) injection (IM or IVPB) Stopped (04/03/22 1800)   PRN Meds:.sodium chloride, sodium chloride, acetaminophen **OR** acetaminophen, alum & mag hydroxide-simeth, dextrose, guaiFENesin, hydrALAZINE, ipratropium-albuterol, lip balm, loperamide, metoprolol tartrate, ondansetron **OR** ondansetron (ZOFRAN) IV, mouth rinse, oxyCODONE, promethazine (PHENERGAN) injection (IM or IVPB), senna-docusate, zolpidem  SUBJECTIVE:   24 hour events:  No acute events That is post OR yesterday Tmax 101.7 prior to surgery   No new complaints.  Feeling well overall.  Hopeful to continue to have improvement in anticipation of possible discharge prior to the weekend  Review of Systems  All other systems reviewed and are negative.     OBJECTIVE:   Blood pressure (!) 140/72, pulse 86, temperature 97.8 F (36.6 C), temperature source Oral, resp. rate 18, height '5\' 7"'$  (1.702 m), weight 90.9 kg, SpO2 97 %. Body mass index is 31.39 kg/m.  Physical Exam Constitutional:      General: She is not in acute distress.    Appearance: Normal appearance.  HENT:  Head: Normocephalic and atraumatic.  Eyes:     Extraocular Movements: Extraocular movements intact.     Conjunctiva/sclera: Conjunctivae normal.  Pulmonary:     Effort: Pulmonary effort is normal. No respiratory distress.  Abdominal:     General: There is no distension.     Palpations: Abdomen is soft.  Musculoskeletal:        General: Normal range of  motion.     Cervical back: Normal range of motion and neck supple.     Comments: Right foot is wrapped postoperatively with wound VAC in place and Darco boot on  Skin:    General: Skin is warm and dry.     Findings: No rash.  Neurological:     General: No focal deficit present.     Mental Status: She is alert and oriented to person, place, and time.  Psychiatric:        Mood and Affect: Mood normal.        Behavior: Behavior normal.      Lab Results: Lab Results  Component Value Date   WBC 21.6 (H) 04/04/2022   HGB 12.4 04/04/2022   HCT 38.6 04/04/2022   MCV 93.9 04/04/2022   PLT 279 04/04/2022    Lab Results  Component Value Date   NA 136 04/04/2022   K 4.3 04/04/2022   CO2 20 (L) 04/04/2022   GLUCOSE 238 (H) 04/04/2022   BUN 8 04/04/2022   CREATININE 0.51 04/04/2022   CALCIUM 8.1 (L) 04/04/2022   GFRNONAA >60 04/04/2022   GFRAA >60 10/28/2016    Lab Results  Component Value Date   ALT 14 03/30/2022   AST 11 (L) 03/30/2022   ALKPHOS 61 03/30/2022   BILITOT 0.6 03/30/2022       Component Value Date/Time   CRP 20.6 (H) 03/29/2022 1718       Component Value Date/Time   ESRSEDRATE 80 (H) 03/29/2022 1718     I have reviewed the micro and lab results in Epic.  Imaging: No results found.   Imaging independently reviewed in Epic.    Raynelle Highland for Infectious Disease [facility] [phone] pager 04/04/2022, 11:26 AM

## 2022-04-04 NOTE — Progress Notes (Signed)
PROGRESS NOTE    Courtney Parks  KWI:[id number] DOB: 1960/09/28 DOA: 03/29/2022 PCP: Pcp, No   Brief Narrative:  61 year old with history of anxiety, depression, DM 2, HLD, HTN, lithiasis comes to the hospital with complaints of right lower quadrant abdominal pain and CVA tenderness on the right side ongoing for the past week along with fatigue.  UA was consistent with glucosuria and urinary tract infection.  CT abdomen pelvis showed nonobstructive left-sided nephrolithiasis and hepatic steatosis.  She was found to have puncture wound of her right foot and was in diabetic ketoacidosis. S/p I&D right foot now dressing changes Podiatry.  ID consulted, cultures reviewed and antibiotics consolidated to IV Unasyn.  Underwent repeat I&D and wound VAC placement by podiatry 8/2.   Assessment & Plan:  Principal Problem:   DKA (diabetic ketoacidosis) ([place]) Active Problems:   Diabetic foot infection ([place])   Hyperlipidemia   Hypertension   History of kidney stones   Anxiety and depression   Hypercalcemia   Puncture wound of plantar aspect of foot   Acute UTI (urinary tract infection)   Abscess of right foot       Sepsis secondary to puncture wound of plantar aspect of right foot Cellulitis of the right foot with loculated abscess Status post I&D of the right foot Patient is now on IV Unasyn.  Cultures reviewed, antibiotics consolidated by ID.  WBC still trending up suspect secondary to OR and debridement yesterday -MRI shows small loculated abscess - Status post repeat I&D/wound VAC placement on 8/2.  Podiatry recommending wound VAC change Monday Wednesday Friday, nonweightbearing in the right lower extremity and follow-up in 1 week.  DKA (diabetic ketoacidosis) ([place]) - resolved.  Insulin-dependent diabetes mellitus type 2 -Hemoglobin A1c 10.3.  DKA protocol completed.  Continue sliding scale.  Increase Semglee to 46 units daily.     Hyperlipidemia LDL is 68, diet controlled.       Hypertension BP is stable for the most part.      Acute UTI (urinary tract infection)   History of kidney stones.  Left-sided nonobstructive stone Now on IV Unasyn     Anxiety and depression Ambien at bedtime while in the hospital. Follow-up with [facility] and/or PCP.     Hypercalcemia Likely from dehydration, resolved  Hypokalemia/hypophosphatemia - Repletion as needed     DVT prophylaxis: enoxaparin (LOVENOX) injection 40 mg Start: 03/29/22 1800 Code Status: Full code Family Communication:   On IV Abx, cont hosp stay.  If she remains afebrile over next 24-hour, will plan to transition over to oral antibiotic and discharge her.  Subjective: Patient doing okay no complaints this morning.  Tells me she has felt the best she has in last several days. Examination: Constitutional: Not in acute distress Respiratory: Clear to auscultation bilaterally Cardiovascular: Normal sinus rhythm, no rubs Abdomen: Nontender nondistended good bowel sounds Musculoskeletal: No edema noted Skin: Right lower extremity wound VAC in place, dressing and Unna boot. Neurologic: CN 2-12 grossly intact.  And nonfocal Psychiatric: Normal judgment and insight. Alert and oriented x 3. Normal mood.  Objective: Vitals:   04/03/22 1837 04/03/22 2110 04/03/22 2322 04/04/22 0541  BP: (!) 143/76 135/77 135/72 (!) 140/72  Pulse: 80 81 77 86  Resp: '18 18 18 18  '$ Temp: 98.1 F (36.7 C) 98.5 F (36.9 C) 98 F (36.7 C) 97.8 F (36.6 C)  TempSrc: Oral Oral Oral Oral  SpO2: 94% 94% 91% 97%  Weight:      Height:  Intake/Output Summary (Last 24 hours) at 04/04/2022 0801 Last data filed at 04/04/2022 0630 Gross per 24 hour  Intake 1654.8 ml  Output 20 ml  Net 1634.8 ml   Filed Weights   03/29/22 0614 03/31/22 0629 04/03/22 1355  Weight: 81.2 kg 90.9 kg 90.9 kg     Data Reviewed:   CBC: Recent Labs  Lab 03/29/22 0626 03/29/22 0656 03/31/22 0246 04/01/22 0520 04/02/22 0451 04/03/22 0446  04/04/22 0509  WBC 20.3*   < > 14.5* 14.0* 14.9* 15.1* 21.6*  NEUTROABS 16.9*  --   --   --   --   --   --   HGB 14.0   < > 11.1* 11.7* 11.2* 10.7* 12.4  HCT 40.6   < > 33.0* 36.0 34.5* 32.0* 38.6  MCV 89.2   < > 89.7 92.3 90.8 88.6 93.9  PLT 244   < > 185 218 238 214 279   < > = values in this interval not displayed.   Basic Metabolic Panel: Recent Labs  Lab 03/30/22 0259 03/30/22 1204 03/31/22 0246 04/01/22 0520 04/02/22 0451 04/03/22 0446 04/04/22 0509  NA 140  --  137 136 140 138 136  K 3.0*  --  3.3* 3.6 3.6 3.3* 4.3  CL 111  --  109 107 108 104 103  CO2 22  --  21* 21* 25 27 20*  GLUCOSE 188*  --  248* 197* 123* 89 238*  BUN 9  --  7 8 5* 5* 8  CREATININE 0.37*  --  0.49 0.37* 0.38* 0.38* 0.51  CALCIUM 8.7*  --  8.5* 8.5* 8.4* 8.0* 8.1*  MG 1.7  --  1.8 1.9 2.1 1.9 2.2  PHOS 1.3* 1.0*  --  2.5  --   --   --    GFR: Estimated Creatinine Clearance: 86.5 mL/min (by C-G formula based on SCr of 0.51 mg/dL). Liver Function Tests: Recent Labs  Lab 03/29/22 0626 03/30/22 0259  AST 16 11*  ALT 16 14  ALKPHOS 76 61  BILITOT 0.8 0.6  PROT 7.5 5.7*  ALBUMIN 4.3 2.6*   Recent Labs  Lab 03/29/22 0626  LIPASE 16   No results for input(s): "AMMONIA" in the last 168 hours. Coagulation Profile: No results for input(s): "INR", "PROTIME" in the last 168 hours. Cardiac Enzymes: No results for input(s): "CKTOTAL", "CKMB", "CKMBINDEX", "TROPONINI" in the last 168 hours. BNP (last 3 results) No results for input(s): "PROBNP" in the last 8760 hours. HbA1C: No results for input(s): "HGBA1C" in the last 72 hours.  CBG: Recent Labs  Lab 04/03/22 1140 04/03/22 1336 04/03/22 1703 04/03/22 2112 04/04/22 0756  GLUCAP 108* 126* 115* 221* 241*   Lipid Profile: Recent Labs    04/02/22 0451  CHOL 102  HDL 18*  LDLCALC 68  TRIG 82  CHOLHDL 5.7   Thyroid Function Tests: No results for input(s): "TSH", "T4TOTAL", "FREET4", "T3FREE", "THYROIDAB" in the last 72  hours. Anemia Panel: No results for input(s): "VITAMINB12", "FOLATE", "FERRITIN", "TIBC", "IRON", "RETICCTPCT" in the last 72 hours. Sepsis Labs: Recent Labs  Lab 04/02/22 0825 04/02/22 1019  LATICACIDVEN 0.8 1.4    Recent Results (from the past 240 hour(s))  Urine Culture     Status: Abnormal   Collection Time: 03/29/22  8:35 AM   Specimen: Urine, Clean Catch  Result Value Ref Range Status   Specimen Description   Final    URINE, CLEAN CATCH Performed at [facility], [address], [place], [place]  [address]    Special Requests   Final    NONE Performed at [facility], [address] [place], [place] [address]    Culture MULTIPLE SPECIES PRESENT, SUGGEST RECOLLECTION (A)  Final   Report Status 03/30/2022 FINAL  Final  MRSA Next Gen by PCR, Nasal     Status: None   Collection Time: 03/29/22  2:04 PM   Specimen: Nasal Mucosa; Nasal Swab  Result Value Ref Range Status   MRSA by PCR Next Gen NOT DETECTED NOT DETECTED Final    Comment: (NOTE) The GeneXpert MRSA Assay (FDA approved for NASAL specimens only), is one component of a comprehensive MRSA colonization surveillance program. It is not intended to diagnose MRSA infection nor to guide or monitor treatment for MRSA infections. Test performance is not FDA approved in patients less than 61 years old. Performed at [facility], [place] [address] [place], [place] [address]   Aerobic/Anaerobic Culture w Gram Stain (surgical/deep wound)     Status: None   Collection Time: 03/30/22  8:37 AM   Specimen: PATH Other; Tissue  Result Value Ref Range Status   Specimen Description   Final    TISSUE Performed at [facility] [address], [place], [place] [address]    Special Requests   Final    TISSUE Performed at [facility], [place] [address], [place], [place] [address]    Gram Stain   Final    NO SQUAMOUS EPITHELIAL CELLS  SEEN FEW WBC SEEN MODERATE GRAM POSITIVE COCCI MODERATE GRAM NEGATIVE RODS    Culture   Final    ABUNDANT KLEBSIELLA OXYTOCA ABUNDANT GROUP B STREP(S.AGALACTIAE)ISOLATED TESTING AGAINST S. AGALACTIAE NOT ROUTINELY PERFORMED DUE TO PREDICTABILITY OF AMP/PEN/VAN SUSCEPTIBILITY. ABUNDANT ENTEROCOCCUS FAECALIS ABUNDANT ESCHERICHIA COLI ABUNDANT PREVOTELLA BIVIA BETA LACTAMASE POSITIVE Performed at [facility], [place] [address], [place], [place] [address]    Report Status 04/02/2022 FINAL  Final   Organism ID, Bacteria KLEBSIELLA OXYTOCA  Final   Organism ID, Bacteria ENTEROCOCCUS FAECALIS  Final   Organism ID, Bacteria ESCHERICHIA COLI  Final      Susceptibility   Escherichia coli - MIC*    AMPICILLIN 4 SENSITIVE Sensitive     CEFAZOLIN <=4 SENSITIVE Sensitive     CEFEPIME <=0.12 SENSITIVE Sensitive     CEFTAZIDIME <=1 SENSITIVE Sensitive     CEFTRIAXONE <=0.25 SENSITIVE Sensitive     CIPROFLOXACIN <=0.25 SENSITIVE Sensitive     GENTAMICIN <=1 SENSITIVE Sensitive     IMIPENEM <=0.25 SENSITIVE Sensitive     TRIMETH/SULFA <=20 SENSITIVE Sensitive     AMPICILLIN/SULBACTAM <=2 SENSITIVE Sensitive     PIP/TAZO <=4 SENSITIVE Sensitive     * ABUNDANT ESCHERICHIA COLI   Enterococcus faecalis - MIC*    AMPICILLIN <=2 SENSITIVE Sensitive     VANCOMYCIN 2 SENSITIVE Sensitive     GENTAMICIN SYNERGY SENSITIVE Sensitive     * ABUNDANT ENTEROCOCCUS FAECALIS   Klebsiella oxytoca - MIC*    AMPICILLIN RESISTANT Resistant     CEFAZOLIN <=4 SENSITIVE Sensitive     CEFEPIME <=0.12 SENSITIVE Sensitive     CEFTAZIDIME <=1 SENSITIVE Sensitive     CEFTRIAXONE <=0.25 SENSITIVE Sensitive     CIPROFLOXACIN <=0.25 SENSITIVE Sensitive     GENTAMICIN <=1 SENSITIVE Sensitive     IMIPENEM <=0.25 SENSITIVE Sensitive     TRIMETH/SULFA <=20 SENSITIVE Sensitive     AMPICILLIN/SULBACTAM 4 SENSITIVE Sensitive     PIP/TAZO <=4 SENSITIVE Sensitive     *  ABUNDANT KLEBSIELLA OXYTOCA  Culture, blood (Routine  X 2) w Reflex to ID Panel     Status: None (Preliminary result)   Collection Time: 04/01/22  5:45 PM   Specimen: BLOOD  Result Value Ref Range Status   Specimen Description   Final    BLOOD BLOOD RIGHT HAND Performed at [facility] [address] [place], [place] [address]    Special Requests   Final    IN PEDIATRIC BOTTLE Blood Culture adequate volume Performed at [facility] [address] [place], [place] [address]    Culture   Final    NO GROWTH 2 DAYS Performed at [facility] [address] [place], [place] [address]    Report Status PENDING  Incomplete  Culture, blood (Routine X 2) w Reflex to ID Panel     Status: None (Preliminary result)   Collection Time: 04/01/22  5:45 PM   Specimen: BLOOD  Result Value Ref Range Status   Specimen Description   Final    BLOOD BLOOD LEFT HAND Performed at [facility] [address], [place], [place] [address]    Special Requests   Final    IN PEDIATRIC BOTTLE Blood Culture adequate volume Performed at [facility] [address] [place], [place] [address]    Culture   Final    NO GROWTH 2 DAYS Performed at [facility] [address] [place], [place] [address]    Report Status PENDING  Incomplete  Aerobic/Anaerobic Culture w Gram Stain (surgical/deep wound)     Status: None (Preliminary result)   Collection Time: 04/03/22  4:39 PM   Specimen: Foot, Right; Abscess  Result Value Ref Range Status   Specimen Description   Final    ABSCESS Performed at [facility] [address], [place], [place] [address]    Special Requests RIGHT FOOT  Final   Gram Stain   Final    RARE WBC PRESENT, PREDOMINANTLY MONONUCLEAR NO ORGANISMS SEEN Performed at [facility], [place] [address] [place], [place] [address]    Culture PENDING  Incomplete   Report Status PENDING  Incomplete         Radiology Studies: No  results found.      Scheduled Meds:  Chlorhexidine Gluconate Cloth  6 each Topical Q0600   enoxaparin (LOVENOX) injection  40 mg Subcutaneous Q24H   feeding supplement (GLUCERNA SHAKE)  237 mL Oral BID BM   Gerhardt's butt cream   Topical BID   insulin aspart  0-15 Units Subcutaneous TID WC   insulin glargine-yfgn  42 Units Subcutaneous Daily   multivitamin with minerals  1 tablet Oral Daily   Continuous Infusions:  sodium chloride 10 mL/hr at 04/04/22 0630   sodium chloride     ampicillin-sulbactam (UNASYN) IV Stopped (04/04/22 0557)   promethazine (PHENERGAN) injection (IM or IVPB) Stopped (04/03/22 1800)     LOS: 5 days   Time spent= 35 mins    Anelisse Jacobson Arsenio Loader, MD [facility]  If 7PM-7AM, please contact night-coverage  04/04/2022, 8:01 AM

## 2022-04-04 NOTE — Progress Notes (Signed)
PHYSICAL THERAPY  Pt feeling better after her second I&D yesterday but wanted to rest this afternoon and be seen in am.  Pt hopes to D/C to home Friday/Saturday.  Pt has a walker and a BSC.  Son is looking into buying her a knee scooter.    Rica Koyanagi  PTA Acute  Rehabilitation Services Office M-F          [phone] Weekend pager [phone]

## 2022-04-04 NOTE — Consult Note (Signed)
WOC Nurse Consult Note: Patient receiving care in [place] Reason for Consult: VAC dressing change to foot MWF, starting Friday 8/4. I have requested the [place] to get a medium dressing for use tomorrow, and have placed the patient on the [facility] f/u list. Val Riles, RN, MSN, CWOCN, CNS-BC, pager [phone]

## 2022-04-05 ENCOUNTER — Other Ambulatory Visit (HOSPITAL_COMMUNITY): Payer: Self-pay

## 2022-04-05 DIAGNOSIS — F419 Anxiety disorder, unspecified: Secondary | ICD-10-CM | POA: Diagnosis not present

## 2022-04-05 DIAGNOSIS — S91331A Puncture wound without foreign body, right foot, initial encounter: Secondary | ICD-10-CM | POA: Diagnosis not present

## 2022-04-05 DIAGNOSIS — Z87442 Personal history of urinary calculi: Secondary | ICD-10-CM | POA: Diagnosis not present

## 2022-04-05 DIAGNOSIS — L089 Local infection of the skin and subcutaneous tissue, unspecified: Secondary | ICD-10-CM | POA: Diagnosis not present

## 2022-04-05 DIAGNOSIS — E785 Hyperlipidemia, unspecified: Secondary | ICD-10-CM | POA: Diagnosis not present

## 2022-04-05 DIAGNOSIS — L02611 Cutaneous abscess of right foot: Secondary | ICD-10-CM | POA: Diagnosis not present

## 2022-04-05 DIAGNOSIS — E11628 Type 2 diabetes mellitus with other skin complications: Secondary | ICD-10-CM | POA: Diagnosis not present

## 2022-04-05 LAB — CBC
HCT: 38.8 % (ref 36.0–46.0)
Hemoglobin: 12.2 g/dL (ref 12.0–15.0)
MCH: 29.3 pg (ref 26.0–34.0)
MCHC: 31.4 g/dL (ref 30.0–36.0)
MCV: 93.3 fL (ref 80.0–100.0)
Platelets: 311 10*3/uL (ref 150–400)
RBC: 4.16 MIL/uL (ref 3.87–5.11)
RDW: 13.2 % (ref 11.5–15.5)
WBC: 17.3 10*3/uL — ABNORMAL HIGH (ref 4.0–10.5)
nRBC: 0 % (ref 0.0–0.2)

## 2022-04-05 LAB — BASIC METABOLIC PANEL
Anion gap: 10 (ref 5–15)
BUN: 10 mg/dL (ref 6–20)
CO2: 24 mmol/L (ref 22–32)
Calcium: 8.4 mg/dL — ABNORMAL LOW (ref 8.9–10.3)
Chloride: 102 mmol/L (ref 98–111)
Creatinine, Ser: 0.51 mg/dL (ref 0.44–1.00)
GFR, Estimated: >60 mL/min (ref >60)
Glucose, Bld: 248 mg/dL — ABNORMAL HIGH (ref 70–99)
Potassium: 3.8 mmol/L (ref 3.5–5.1)
Sodium: 136 mmol/L (ref 135–145)

## 2022-04-05 LAB — GLUCOSE, CAPILLARY
Glucose-Capillary: 220 mg/dL — ABNORMAL HIGH (ref 70–99)
Glucose-Capillary: 177 mg/dL — ABNORMAL HIGH (ref 70–99)

## 2022-04-05 LAB — MAGNESIUM: Magnesium: 2.3 mg/dL (ref 1.7–2.4)

## 2022-04-05 MED ORDER — "PEN NEEDLES 3/16"" 31G X 5 MM MISC": 100.0000 | Freq: Four times a day (QID) | 3 refills | Status: AC

## 2022-04-05 MED ORDER — AMOXICILLIN-POT CLAVULANATE 875-125 MG PO TABS
1.0000 | ORAL_TABLET | Freq: Two times a day (BID) | ORAL | 0 refills | Status: DC
  Filled 2022-04-05: qty 52, 26d supply, fill #0

## 2022-04-05 MED ORDER — AMOXICILLIN-POT CLAVULANATE 875-125 MG PO TABS
1.0000 | ORAL_TABLET | Freq: Two times a day (BID) | ORAL | Status: DC
  Administered 2022-04-05: 1 via ORAL
  Filled 2022-04-05: qty 1

## 2022-04-05 MED ORDER — OXYCODONE HCL 5 MG PO TABS: 5.0000 mg | ORAL_TABLET | ORAL | 0 refills | Status: DC | PRN

## 2022-04-05 MED ORDER — AMOXICILLIN-POT CLAVULANATE 875-125 MG PO TABS: 1.0000 | ORAL_TABLET | Freq: Two times a day (BID) | ORAL | 0 refills | Status: AC

## 2022-04-05 MED ORDER — SENNOSIDES-DOCUSATE SODIUM 8.6-50 MG PO TABS: 1.0000 | ORAL_TABLET | Freq: Every evening | ORAL | 1 refills | Status: DC | PRN

## 2022-04-05 MED ORDER — TOUJEO SOLOSTAR 300 UNIT/ML ~~LOC~~ SOPN: 42.0000 [IU] | PEN_INJECTOR | Freq: Every day | SUBCUTANEOUS | 1 refills | Status: AC

## 2022-04-05 MED ORDER — FREESTYLE LIBRE 2 SENSOR MISC: 1.0000 | 4 refills | Status: AC

## 2022-04-05 NOTE — Consult Note (Addendum)
WOC Nurse Consult Note: Patient receiving care in [place] 1610. Primary RN Marolyn Hammock, present throughout the [facility] change process Reason for Consult: First post-op VAC dressing change to plantar surface of right foot Wound type: surgical Pressure Injury POA: Yes/No/NA Measurement: 9 cm x 4 cm x 1.8cm Wound bed: boggy, red and black tissue throughout Drainage (amount, consistency, odor) dark serosanginous in existing cannister Periwound: significantly macerated with peeling skin. There is also a darkened spot on the dorsum of the foot just above the toes that measures 2 cm x 2.2 cm. The dorsum of the foot is also macerated and peeling. All of the macerated peeling areas were protected with vaseline gauze. The Trac pad was bridged to the dorsum of the foot. The entire dressing was padded with a roll of kerlix and an ace wrap was used to secure everything in place. The orthotic shoe was placed back on the foot.  The patient tolerated the procedure without using medication for pain prior to the change. The patient was laughing and joking during my encounter. She thanked me and stated she wanted my name and the name of my supervisor so that she could send my supervisor a complimentary letter on our encounter today. Dressing procedure/placement/frequency: Vaseline gauze placed over the wound area on the plantar surface, and the darkened area on the dorsal surface. One piece of black foam was placed over the entire plantar wound area, drape applied. Seal obtained at 125 mmHg continuous negative pressure. Bridged to dorsum of the foot.;  Val Riles, RN, MSN, CWOCN, CNS-BC, pager [phone]

## 2022-04-05 NOTE — Progress Notes (Signed)
Inpatient Diabetes Program Recommendations  AACE/ADA: New Consensus Statement on Inpatient Glycemic Control (2015)  Target Ranges:  Prepandial:   less than 140 mg/dL      Peak postprandial:   less than 180 mg/dL (1-2 hours)      Critically ill patients:  140 - 180 mg/dL   Lab Results  Component Value Date   GLUCAP 177 (H) 04/05/2022   HGBA1C 10.3 (H) 03/29/2022    Review of Glycemic Control  Latest Reference Range & Units 04/04/22 07:56 04/04/22 12:02 04/04/22 16:20 04/04/22 21:46 04/05/22 07:27 04/05/22 11:55  Glucose-Capillary 70 - 99 mg/dL 241 (H) 246 (H) 247 (H) 269 (H) 220 (H) 177 (H)   Diabetes history: DM 2 Outpatient Diabetes medications:  Trulicity 1.5 mg weekly Current orders for Inpatient glycemic control:  Novolog moderate tid with meals  Semglee 46 units daily Inpatient Diabetes Program Recommendations:    Note plans for patient to d/c home today.  Visited with patient briefly and she states she plans to take the basal insulin as ordered by MD.  She is currently wearing a freestyle CGM that was place by diabetes coord. Last week.  She has no further questions.  Reminded her of importance of finding PCP and following up closely on her blood sugars.   Will need rx. For Tou[place]o [phone]) 46 units daily. Will also need pen needles- (#[id number]) and needs Rx. for [facility] [place] 2 per month [phone]).  Recommend continuation of Trulicity as previously ordered.   Thanks,  Adah Perl, RN, BC-ADM Inpatient Diabetes Coordinator Pager [phone]  (8a-5p)

## 2022-04-05 NOTE — Progress Notes (Signed)
[facility]  Date of Admission:  03/29/2022           Reason for visit: Follow up on diabetic foot infection  Current antibiotics: Unasyn   ASSESSMENT:    61 y.o. female admitted with:  Diabetic foot wound: Secondary to prior puncture wound to the plantar aspect of the right foot and complicated by cellulitis and loculated abscess.  Status post incision and drainage of multiple fascial planes on 03/29/2022 by podiatry with purulence noted to be emanating from the plantar midfoot that penetrated throughout the plantar fat pad as well as deep to the plantar fascia.  There did not appear to be any bone involvement and MRI was negative for osteomyelitis.  Operative cultures polymicrobial with group B strep, E. coli, Klebsiella, and Prevotella.  She returned to the OR 04/03/2022 with podiatry for repeat I&D where further purulent drainage was found.  Cultures were obtained and this is currently reincubated for better growth.  ABIs were completed as well this admission which were reassuring without any evidence of significant PAD. Sepsis: Secondary to #1 and has improved following repeat I&D 8/2. Uncontrolled type 2 diabetes: Hemoglobin A1c 10.3 after she presented with DKA this admission in the setting of her severe infection.   RECOMMENDATIONS:    Will switch to Augmentin at discharge for 4 weeks from 8/2 debridement ending on 05/01/22 due to depth of infection Will follow 8/2 cultures and adjust antibiotics if needed outpatient Will need continued wound care, off loading, glycemic control to adequately heal this wound Follow up with myself at [facility] on 04/18/22 at 4pm.   Principal Problem:   DKA (diabetic ketoacidosis) ([place]) Active Problems:   Diabetic foot infection ([place])   Hyperlipidemia   Hypertension   History of kidney stones   Anxiety and depression   Hypercalcemia   Puncture wound of plantar aspect of foot   Acute UTI (urinary tract infection)    Abscess of right foot    MEDICATIONS:    Scheduled Meds: . Chlorhexidine Gluconate Cloth  6 each Topical Q0600  . enoxaparin (LOVENOX) injection  40 mg Subcutaneous Q24H  . feeding supplement (GLUCERNA SHAKE)  237 mL Oral BID BM  . Gerhardt's butt cream   Topical BID  . insulin aspart  0-15 Units Subcutaneous TID WC  . insulin glargine-yfgn  46 Units Subcutaneous Daily  . multivitamin with minerals  1 tablet Oral Daily   Continuous Infusions: . sodium chloride 10 mL/hr at 04/04/22 0630  . sodium chloride    . ampicillin-sulbactam (UNASYN) IV 3 g (04/05/22 0035)  . promethazine (PHENERGAN) injection (IM or IVPB) Stopped (04/03/22 1800)   PRN Meds:.sodium chloride, sodium chloride, acetaminophen **OR** acetaminophen, alum & mag hydroxide-simeth, dextrose, guaiFENesin, hydrALAZINE, ipratropium-albuterol, lip balm, loperamide, metoprolol tartrate, ondansetron **OR** ondansetron (ZOFRAN) IV, mouth rinse, oxyCODONE, promethazine (PHENERGAN) injection (IM or IVPB), senna-docusate, zolpidem  SUBJECTIVE:   24 hour events:  Tmax 98.9 WBC improved Cultures 8/2 reincubated for better growth   Feeling well.  Wants to go home.  No fevers, tolerating antibiotics.  Review of Systems  All other systems reviewed and are negative.     OBJECTIVE:   Blood pressure (!) 129/53, pulse 75, temperature 98.1 F (36.7 C), temperature source Oral, resp. rate 18, height '5\' 7"'$  (1.702 m), weight 90.9 kg, SpO2 100 %. Body mass index is 31.39 kg/m.  Physical Exam Constitutional:      General: She is not in acute distress.  Appearance: She is well-developed.  HENT:     Head: Normocephalic and atraumatic.  Eyes:     General: No scleral icterus.    Extraocular Movements: Extraocular movements intact.  Pulmonary:     Effort: Pulmonary effort is normal. No respiratory distress.  Abdominal:     General: There is no distension.     Palpations: Abdomen is soft.  Musculoskeletal:        General:  Normal range of motion.     Cervical back: Normal range of motion and neck supple.     Comments: Right foot wrapped in Darco boot.   Skin:    General: Skin is warm and dry.     Findings: No rash.  Neurological:     General: No focal deficit present.     Mental Status: She is alert and oriented to person, place, and time.  Psychiatric:        Mood and Affect: Mood normal.        Behavior: Behavior normal.      Lab Results: Lab Results  Component Value Date   WBC 17.3 (H) 04/05/2022   HGB 12.2 04/05/2022   HCT 38.8 04/05/2022   MCV 93.3 04/05/2022   PLT 311 04/05/2022    Lab Results  Component Value Date   NA 136 04/05/2022   K 3.8 04/05/2022   CO2 24 04/05/2022   GLUCOSE 248 (H) 04/05/2022   BUN 10 04/05/2022   CREATININE 0.51 04/05/2022   CALCIUM 8.4 (L) 04/05/2022   GFRNONAA >60 04/05/2022   GFRAA >60 10/28/2016    Lab Results  Component Value Date   ALT 14 03/30/2022   AST 11 (L) 03/30/2022   ALKPHOS 61 03/30/2022   BILITOT 0.6 03/30/2022       Component Value Date/Time   CRP 20.6 (H) 03/29/2022 1718       Component Value Date/Time   ESRSEDRATE 80 (H) 03/29/2022 1718     I have reviewed the micro and lab results in Epic.  Imaging: No results found.   Imaging independently reviewed in Epic.    Raynelle Highland for Infectious Disease [facility] [phone] pager 04/05/2022, 6:34 AM

## 2022-04-05 NOTE — Progress Notes (Signed)
  Subjective:  Patient ID: Courtney Parks, female    DOB: 1961-06-06,  MRN: [id number]  A 61 y.o. female presents with right foot abscess status post revisional incision and drainage washout with application of negative pressure wound Therapy postop day 2.  Patient states that she is doing okay.  Her pain is doing better.  She has not had any fever spikes overnight.  Her white count is trending down Objective:   Vitals:   04/04/22 2143 04/05/22 0410  BP: (!) 142/72 (!) 129/53  Pulse: 90 75  Resp: 18 18  Temp: 98.9 F (37.2 C) 98.1 F (36.7 C)  SpO2: 100% 100%   General AA&O x3. Normal mood and affect.  Vascular Dorsalis pedis and posterior tibial pulses 2/4 bilat. Brisk capillary refill to all digits. Pedal hair present.  Neurologic Epicritic sensation grossly intact.  Dermatologic Wound vacs intact.  Functioning well.  Suction is being held.  No signs of redness  Orthopedic: MMT 5/5 in dorsiflexion, plantarflexion, inversion, and eversion. Normal joint ROM without pain or crepitus.    Assessment & Plan:  Patient was evaluated and treated and all questions answered.  Right foot abscess status post revisional incision drainage washout with debridement and application of wound VAC -All questions and concerns were discussed with the patient in extensive detail -Patient is okay to be discharged from podiatric standpoint nonweightbearing to the right lower extremity Monday Wednesday Friday wound VAC changes -She will need home health nursing to change wound [facility] Monday Wednesday Friday -She will need a home VAC -Patient will undergo wound VAC change by nursing staff today. Felipa Furnace, DPM  Accessible via secure chat for questions or concerns.

## 2022-04-05 NOTE — Discharge Summary (Signed)
Physician Discharge Summary  AICIA BABINSKI FTD:[id number] DOB: [birth year] DOA: 03/29/2022  PCP: Pcp, No  Admit date: 03/29/2022 Discharge date: 04/05/2022  Admitted From:  Home Disposition:  Home with [facility]  Recommendations for Outpatient Follow-up:  Follow up with PCP in 1-2 weeks Please obtain BMP/CBC in one week your next doctors visit.  Continue home [place].  Toujeo 42 units daily added.  Advised to keep track of her blood glucose.  Libre sensor has been prescribed.  She would eventually benefit from outpatient endocrinology follow-up Pain medication with bowel regimen prescribed Oral Augmentin for 28 more days to complete 1 month course.  Outpatient follow-up with infectious disease, will be arranged by their service. Wound VAC and dressing changes at home Monday Wednesday Friday. Follow-up outpatient podiatry, their service to arrange for this.  Case discussed with podiatrist.   Discharge Condition: Stable CODE STATUS: Full code Diet recommendation: Diabetic diet  Brief/Interim Summary: 61 year old with history of anxiety, depression, DM 2, HLD, HTN, lithiasis comes to the hospital with complaints of right lower quadrant abdominal pain and CVA tenderness on the right side ongoing for the past week along with fatigue.  UA was consistent with glucosuria and urinary tract infection.  CT abdomen pelvis showed nonobstructive left-sided nephrolithiasis and hepatic steatosis.  She was found to have puncture wound of her right foot and was in diabetic ketoacidosis. S/p I&D right foot now dressing changes Podiatry.  ID consulted, cultures reviewed and antibiotics consolidated to IV Unasyn.  Underwent repeat I&D and wound VAC placement by podiatry 8/2.  Wound VAC and dressing changed by podiatry on 8/6 on the day of discharge.  Eventually her insulin regimen was adjusted as mentioned above with the help of diabetic coordinator.  Infectious disease recommended 30 days of total antibiotics,  will be discharging on 28 days of Augmentin.  Follow-up to be arranged by their service.          Sepsis secondary to puncture wound of plantar aspect of right foot Cellulitis of the right foot with loculated abscess Status post I&D of the right foot  Cultures reviewed, antibiotics consolidated by ID.  WBC improved.  Will be discharged on oral Augmentin for 28 more days -MRI shows small loculated abscess - Status post repeat I&D/wound VAC placement on 8/2.  Podiatry recommending wound VAC change Monday Wednesday Friday, nonweightbearing in the right lower extremity and follow-up in 1 week.   DKA (diabetic ketoacidosis) ([place]) - resolved.  Insulin-dependent diabetes mellitus type 2 -Hemoglobin A1c 10.3.  DKA protocol completed.   Increase Semglee to 48 units daily.  Resume home regimen.  Would benefit from outpatient endocrinology follow-up.     Hyperlipidemia LDL is 68, diet controlled.      Hypertension BP is stable for the most part.      Acute UTI (urinary tract infection)   History of kidney stones.  Left-sided nonobstructive stone Resolved     Anxiety and depression Ambien at bedtime while in the hospital. Follow-up with [facility] and/or PCP.     Hypercalcemia Likely from dehydration, resolved   Hypokalemia/hypophosphatemia - Repletion as needed    Discharge Diagnoses:  Principal Problem:   DKA (diabetic ketoacidosis) ([place]) Active Problems:   Diabetic foot infection ([place])   Hyperlipidemia   Hypertension   History of kidney stones   Anxiety and depression   Hypercalcemia   Puncture wound of plantar aspect of foot   Acute UTI (urinary tract infection)   Abscess of right foot  Consultations: Diabetic coordinator Podiatry Infectious disease  Subjective: Feels well no complaints.  Discharge Exam: Vitals:   04/05/22 0410 04/05/22 1348  BP: (!) 129/53 129/63  Pulse: 75 80  Resp: 18 16  Temp: 98.1 F (36.7 C) 98.3 F (36.8 C)  SpO2: 100% 99%    Vitals:   04/04/22 1404 04/04/22 2143 04/05/22 0410 04/05/22 1348  BP: 131/61 (!) 142/72 (!) 129/53 129/63  Pulse: 86 90 75 80  Resp: '16 18 18 16  '$ Temp: 98.1 F (36.7 C) 98.9 F (37.2 C) 98.1 F (36.7 C) 98.3 F (36.8 C)  TempSrc: Oral Oral Oral Oral  SpO2: 97% 100% 100% 99%  Weight:      Height:        General: Pt is alert, awake, not in acute distress Cardiovascular: RRR, S1/S2 +, no rubs, no gallops Respiratory: CTA bilaterally, no wheezing, no rhonchi Abdominal: Soft, NT, ND, bowel sounds + Extremities: no edema, no cyanosis  Discharge Instructions   Allergies as of 04/05/2022       Reactions   Codeine    REACTION: Nausea   Demerol Nausea And Vomiting   Other Other (See Comments)   Narcotics- drops her blood pressure   Shellfish Allergy Swelling        Medication List     STOP taking these medications    potassium chloride SA 20 MEQ tablet Commonly known as: KLOR-CON M       TAKE these medications    acetaminophen 500 MG tablet Commonly known as: TYLENOL Take 500 mg by mouth every 6 (six) hours as needed for moderate pain.   amoxicillin-clavulanate 875-125 MG tablet Commonly known as: AUGMENTIN Take 1 tablet by mouth every 12 (twelve) hours for 28 days.   FreeStyle Libre 2 Sensor Misc 1 each by Does not apply route PRO.   ibuprofen 200 MG tablet Commonly known as: ADVIL Take 200-800 mg by mouth every 6 (six) hours as needed for moderate pain.   multivitamin capsule Take 1 capsule by mouth every other day.   oxyCODONE 5 MG immediate release tablet Commonly known as: Oxy IR/ROXICODONE Take 1 tablet (5 mg total) by mouth every 4 (four) hours as needed for breakthrough pain or moderate pain.   Pen Needles 3/16" 31G X 5 MM Misc 100 each by Does not apply route 4 (four) times daily.   senna-docusate 8.6-50 MG tablet Commonly known as: Senokot-S Take 1 tablet by mouth at bedtime as needed for moderate constipation.   Toujeo SoloStar 300  UNIT/ML Solostar Pen Generic drug: insulin glargine (1 Unit Dial) Inject 42 Units into the skin daily in the afternoon.   Trulicity 1.5 ZO/1.0RU Sopn Generic drug: Dulaglutide Inject 1.5 mg into the skin once a week. What changed: Another medication with the same name was removed. Continue taking this medication, and follow the directions you see here.        Allergies  Allergen Reactions   Codeine     REACTION: Nausea   Demerol Nausea And Vomiting   Other Other (See Comments)    Narcotics- drops her blood pressure   Shellfish Allergy Swelling    You were cared for by a hospitalist during your hospital stay. If you have any questions about your discharge medications or the care you received while you were in the hospital after you are discharged, you can call the unit and asked to speak with the hospitalist on call if the hospitalist that took care of you is not available. Once  you are discharged, your primary care physician will handle any further medical issues. Please note that no refills for any discharge medications will be authorized once you are discharged, as it is imperative that you return to your primary care physician (or establish a relationship with a primary care physician if you do not have one) for your aftercare needs so that they can reassess your need for medications and monitor your lab values.   Procedures/Studies: VAS [place] ABI WITH/WO TBI  Result Date: 04/02/2022  LOWER EXTREMITY DOPPLER STUDY Patient Name:  JULLIANA WHITMYER  Date of Exam:   04/01/2022 Medical Rec #: [id number]                 Accession #:    [id number] Date of Birth: 05/23/1961                Patient Gender: F Patient Age:   37 years Exam Location:  [facility] Procedure:      VAS [place] ABI WITH/WO TBI Referring Phys: ADAM MCDONALD --------------------------------------------------------------------------------  Indications: Ulceration. High Risk Factors: Hypertension, hyperlipidemia,  Diabetes.  Comparison Study: No prior studies. Performing Technologist: Carlos Levering RVT  Examination Guidelines: A complete evaluation includes at minimum, Doppler waveform signals and systolic blood pressure reading at the level of bilateral brachial, anterior tibial, and posterior tibial arteries, when vessel segments are accessible. Bilateral testing is considered an integral part of a complete examination. Photoelectric Plethysmograph (PPG) waveforms and toe systolic pressure readings are included as required and additional duplex testing as needed. Limited examinations for reoccurring indications may be performed as noted.  ABI Findings: +---------+------------------+-----+-----------+--------+ Right    Rt Pressure (mmHg)IndexWaveform   Comment  +---------+------------------+-----+-----------+--------+ Brachial 122                    triphasic           +---------+------------------+-----+-----------+--------+ PTA      144               1.04 multiphasic         +---------+------------------+-----+-----------+--------+ DP       137               0.99 multiphasic         +---------+------------------+-----+-----------+--------+ Great Toe35                0.25                     +---------+------------------+-----+-----------+--------+ +---------+------------------+-----+---------+-------+ Left     Lt Pressure (mmHg)IndexWaveform Comment +---------+------------------+-----+---------+-------+ Brachial 138                    triphasic        +---------+------------------+-----+---------+-------+ PTA      152               1.10 triphasic        +---------+------------------+-----+---------+-------+ DP       150               1.09 triphasic        +---------+------------------+-----+---------+-------+ Great Toe99                0.72                  +---------+------------------+-----+---------+-------+  +-------+-----------+-----------+------------+------------+ ABI/TBIToday's ABIToday's TBIPrevious ABIPrevious TBI +-------+-----------+-----------+------------+------------+ Right  1.04       0.25                                +-------+-----------+-----------+------------+------------+  Left   1.1        0.72                                +-------+-----------+-----------+------------+------------+  Summary: Right: Resting right ankle-brachial index is within normal range. No evidence of significant right lower extremity arterial disease. The right toe-brachial index is abnormal. Left: Resting left ankle-brachial index is within normal range. No evidence of significant left lower extremity arterial disease. The left toe-brachial index is normal. *See table(s) above for measurements and observations.  Electronically signed by Orlie Pollen on 04/02/2022 at 12:47:19 PM.    Final    MR FOOT RIGHT W WO CONTRAST  Result Date: 03/29/2022 CLINICAL DATA:  Foot swelling, diabetic, osteomyelitis suspected. EXAM: MRI OF THE RIGHT FOREFOOT WITHOUT AND WITH CONTRAST TECHNIQUE: Multiplanar, multisequence MR imaging of the right forefoot was performed before and after the administration of intravenous contrast. CONTRAST:  59m GADAVIST GADOBUTROL 1 MMOL/ML IV SOLN COMPARISON:  None Available. FINDINGS: Bones/Joint/Cartilage Marrow signal is within normal limits. No evidence of osteomyelitis. No evidence of fracture or significant arthropathy. Ligaments Lisfranc and collateral ligaments are intact. 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 Flexor and extensor tendons appear intact. Soft tissues There is a peripherally enhancing loculated fluid collection about the plantar aspect of the foot at the level of the fourth metatarsal measuring approximately 0.9 x 1.1 x 3.7 cm. Generalized subcutaneous soft tissue edema about the dorsum and  plantar aspect of the foot. There is skin irregularity about the plantar aspect of the foot about the level of the above-mentioned abscess concerning for deep skin wound, correlate with physical examination findings. IMPRESSION: 1.  No evidence of osteomyelitis. 2. Marked skin thickening and subcutaneous soft tissue edema about the plantar and dorsal aspect of the foot. 3. Peripherally enhancing loculated fluid collection about the plantar aspect of the foot at the level of the fourth metatarsal measuring approximately 0.9 x 1.1 x 3.7 cm most consistent with an abscess. There is a skin irregularity about the above-mentioned collection, likely a skin wound. Correlate with physical examination findings. Electronically Signed   By: IKeane PoliceD.O.   On: 03/29/2022 22:20   DG Foot 2 Views Right  Result Date: 03/29/2022 CLINICAL DATA:  Infected puncture wound in the plantar right foot. EXAM: RIGHT FOOT - 2 VIEW COMPARISON:  Right foot radiograph dated 01/12/2009. FINDINGS: There is no acute fracture or dislocation. The bones are mildly osteopenic. No bone erosion or periosteal elevation. There is soft tissue swelling of the forefoot. Subcutaneous edema or tiny pocket of air in the soft tissues of the plantar forefoot. No radiopaque foreign object. IMPRESSION: 1. No acute fracture or dislocation. No evidence of acute osteomyelitis by radiograph. 2. Soft tissue swelling of the forefoot and possible tiny pockets of gas in the soft tissues of the plantar forefoot. No radiopaque foreign object. Electronically Signed   By: AAnner CreteM.D.   On: 03/29/2022 19:35   CT Abdomen Pelvis W Contrast  Result Date: 03/29/2022 CLINICAL DATA:  RLQ abdominal pain (Age >= 14y) EXAM: CT ABDOMEN AND PELVIS WITH CONTRAST TECHNIQUE: Multidetector CT imaging of the abdomen and pelvis was performed using the standard protocol following bolus administration of intravenous contrast. RADIATION DOSE REDUCTION: This exam was performed  according to the departmental dose-optimization program which includes automated exposure control, adjustment of the mA and/or kV according to  patient size and/or use of iterative reconstruction technique. CONTRAST:  131m OMNIPAQUE IOHEXOL 300 MG/ML  SOLN COMPARISON:  CT abdomen pelvis 07/10/2021 FINDINGS: Lower chest: No acute abnormality. Hepatobiliary: Mildly lower liver density, in part due to contrast timing but may suggest a degree of hepatic steatosis. Multiple tiny liver hypodensities, statistically likely to be small cysts or hemangiomas. The gallbladder is surgically absent. Pancreas: Unremarkable. No pancreatic ductal dilatation or surrounding inflammatory changes. Spleen: Normal in size without focal abnormality. Small adjacent splenules. Adrenals/Urinary Tract: Adrenal glands are unremarkable. Multiple small bilateral renal cysts, some of which are simple and several too small to characterize. These are seen on multiple prior exams and no follow-up imaging is recommended. There are nonobstructive 2 mm stones in the left upper and lower poles. No hydronephrosis or ureterolithiasis. Stomach/Bowel: The stomach is within normal limits. There is no evidence of bowel obstruction.The appendix is normal. Scattered colonic diverticula. No diverticulitis. Vascular/Lymphatic: Aortoiliac atherosclerosis. No AAA. No lymphadenopathy. Reproductive: Smalls subserosal or pedunculated calcified uterine fibroid posteriorly. No adnexal mass. Other: Small fat containing left inguinal hernia. No bowel containing hernia. No ascites. No free air. Punctate subcutaneous free gas in the left lower abdomen likely related to medication injection. Musculoskeletal: Unchanged anterior L5 compression deformity with 25% height loss. No acute osseous abnormality. Mild degenerative changes of the spine with grade 1 anterolisthesis at L4-L5. Mild bilateral hip osteoarthritis. No significant retropulsion. IMPRESSION: No acute  abdominopelvic abnormality.  Normal appendix. Nonobstructive left-sided nephrolithiasis. Mild hepatic steatosis. Additional chronic findings as described above. Electronically Signed   By: JMaurine SimmeringM.D.   On: 03/29/2022 08:23     The results of significant diagnostics from this hospitalization (including imaging, microbiology, ancillary and laboratory) are listed below for reference.     Microbiology: Recent Results (from the past 240 hour(s))  Urine Culture     Status: Abnormal   Collection Time: 03/29/22  8:35 AM   Specimen: Urine, Clean Catch  Result Value Ref Range Status   Specimen Description   Final    URINE, CLEAN CATCH Performed at [facility], [address] [place] [address]   Special Requests   Final    NONE Performed at [facility], [address] [place] [address]   Culture MULTIPLE SPECIES PRESENT, SUGGEST RECOLLECTION (A)  Final   Report Status 03/30/2022 FINAL  Final  MRSA Next Gen by PCR, Nasal     Status: None   Collection Time: 03/29/22  2:04 PM   Specimen: Nasal Mucosa; Nasal Swab  Result Value Ref Range Status   MRSA by PCR Next Gen NOT DETECTED NOT DETECTED Final    Comment: (NOTE) The GeneXpert MRSA Assay (FDA approved for NASAL specimens only), is one component of a comprehensive MRSA colonization surveillance program. It is not intended to diagnose MRSA infection nor to guide or monitor treatment for MRSA infections. Test performance is not FDA approved in patients less than 216years old. Performed at [facility] [id number] [place] [address]  Aerobic/Anaerobic Culture w Gram Stain (surgical/deep wound)     Status: None   Collection Time: 03/30/22  8:37 AM   Specimen: PATH Other; Tissue  Result Value Ref Range Status   Specimen Description   Final    TISSUE Performed at [facility] [place] [address]    Special Requests   Final    TISSUE Performed at [facility], [place] [address], [place], [place] [address]    Gram Stain   Final    NO SQUAMOUS EPITHELIAL CELLS SEEN FEW WBC SEEN MODERATE GRAM POSITIVE COCCI MODERATE GRAM NEGATIVE RODS    Culture   Final    ABUNDANT KLEBSIELLA OXYTOCA ABUNDANT GROUP B STREP(S.AGALACTIAE)ISOLATED TESTING AGAINST S. AGALACTIAE NOT ROUTINELY PERFORMED DUE TO PREDICTABILITY OF AMP/PEN/VAN SUSCEPTIBILITY. ABUNDANT ENTEROCOCCUS FAECALIS ABUNDANT ESCHERICHIA COLI ABUNDANT PREVOTELLA BIVIA BETA LACTAMASE POSITIVE Performed at [facility], [place] [address] [place], [place] [address]    Report Status 04/02/2022 FINAL  Final   Organism ID, Bacteria KLEBSIELLA OXYTOCA  Final   Organism ID, Bacteria ENTEROCOCCUS FAECALIS  Final   Organism ID, Bacteria ESCHERICHIA COLI  Final      Susceptibility   Escherichia coli - MIC*    AMPICILLIN 4 SENSITIVE Sensitive     CEFAZOLIN <=4 SENSITIVE Sensitive     CEFEPIME <=0.12 SENSITIVE Sensitive     CEFTAZIDIME <=1 SENSITIVE Sensitive     CEFTRIAXONE <=0.25 SENSITIVE Sensitive     CIPROFLOXACIN <=0.25 SENSITIVE Sensitive     GENTAMICIN <=1 SENSITIVE Sensitive     IMIPENEM <=0.25 SENSITIVE Sensitive     TRIMETH/SULFA <=20 SENSITIVE Sensitive     AMPICILLIN/SULBACTAM <=2 SENSITIVE Sensitive     PIP/TAZO <=4 SENSITIVE Sensitive     * ABUNDANT ESCHERICHIA COLI   Enterococcus faecalis - MIC*    AMPICILLIN <=2 SENSITIVE Sensitive     VANCOMYCIN 2 SENSITIVE Sensitive     GENTAMICIN SYNERGY SENSITIVE Sensitive     * ABUNDANT ENTEROCOCCUS FAECALIS   Klebsiella oxytoca - MIC*    AMPICILLIN RESISTANT Resistant     CEFAZOLIN <=4 SENSITIVE Sensitive     CEFEPIME <=0.12 SENSITIVE Sensitive     CEFTAZIDIME <=1 SENSITIVE Sensitive     CEFTRIAXONE <=0.25 SENSITIVE Sensitive     CIPROFLOXACIN <=0.25 SENSITIVE Sensitive     GENTAMICIN <=1 SENSITIVE Sensitive     IMIPENEM <=0.25 SENSITIVE Sensitive      TRIMETH/SULFA <=20 SENSITIVE Sensitive     AMPICILLIN/SULBACTAM 4 SENSITIVE Sensitive     PIP/TAZO <=4 SENSITIVE Sensitive     * ABUNDANT KLEBSIELLA OXYTOCA  Culture, blood (Routine X 2) w Reflex to ID Panel     Status: None (Preliminary result)   Collection Time: 04/01/22  5:45 PM   Specimen: BLOOD  Result Value Ref Range Status   Specimen Description   Final    BLOOD BLOOD RIGHT HAND Performed at [facility], [place] [address], [place], [place] [address]    Special Requests   Final    IN PEDIATRIC BOTTLE Blood Culture adequate volume Performed at [place] [address] [place], [place] [address]    Culture   Final    NO GROWTH 4 DAYS Performed at [facility], [place] [address] [place], [place] [address]    Report Status PENDING  Incomplete  Culture, blood (Routine X 2) w Reflex to ID Panel     Status: None (Preliminary result)   Collection Time: 04/01/22  5:45 PM   Specimen: BLOOD  Result Value Ref Range Status   Specimen Description   Final    BLOOD BLOOD LEFT HAND Performed at [place] [address] [place], [place] [address]    Special Requests   Final    IN PEDIATRIC BOTTLE Blood Culture adequate volume Performed at [place] [address], [place],  [address]    Culture  Final    NO GROWTH 4 DAYS Performed at [facility], [place] [address], [place], [place] [address]    Report Status PENDING  Incomplete  Aerobic/Anaerobic Culture w Gram Stain (surgical/deep wound)     Status: None (Preliminary result)   Collection Time: 04/03/22  4:39 PM   Specimen: Foot, Right; Abscess  Result Value Ref Range Status   Specimen Description   Final    ABSCESS Performed at [place] [address] [place], [place] [address]    Special Requests RIGHT FOOT  Final   Gram Stain   Final    RARE WBC PRESENT, PREDOMINANTLY MONONUCLEAR NO ORGANISMS SEEN     Culture   Final    RARE GROUP B STREP(S.AGALACTIAE)ISOLATED RARE GRAM NEGATIVE RODS HOLDING FOR POSSIBLE ANAEROBE Performed at [facility], [place] [address] [place], [place] [address]    Report Status PENDING  Incomplete     Labs: BNP (last 3 results) No results for input(s): "BNP" in the last 8760 hours. Basic Metabolic Panel: Recent Labs  Lab 03/30/22 0259 03/30/22 1204 03/31/22 0246 04/01/22 0520 04/02/22 0451 04/03/22 0446 04/04/22 0509 04/05/22 0457  NA 140  --    < > 136 140 138 136 136  K 3.0*  --    < > 3.6 3.6 3.3* 4.3 3.8  CL 111  --    < > 107 108 104 103 102  CO2 22  --    < > 21* 25 27 20* 24  GLUCOSE 188*  --    < > 197* 123* 89 238* 248*  BUN 9  --    < > 8 5* 5* 8 10  CREATININE 0.37*  --    < > 0.37* 0.38* 0.38* 0.51 0.51  CALCIUM 8.7*  --    < > 8.5* 8.4* 8.0* 8.1* 8.4*  MG 1.7  --    < > 1.9 2.1 1.9 2.2 2.3  PHOS 1.3* 1.0*  --  2.5  --   --   --   --    < > = values in this interval not displayed.   Liver Function Tests: Recent Labs  Lab 03/30/22 0259  AST 11*  ALT 14  ALKPHOS 61  BILITOT 0.6  PROT 5.7*  ALBUMIN 2.6*   No results for input(s): "LIPASE", "AMYLASE" in the last 168 hours. No results for input(s): "AMMONIA" in the last 168 hours. CBC: Recent Labs  Lab 04/01/22 0520 04/02/22 0451 04/03/22 0446 04/04/22 0509 04/05/22 0457  WBC 14.0* 14.9* 15.1* 21.6* 17.3*  HGB 11.7* 11.2* 10.7* 12.4 12.2  HCT 36.0 34.5* 32.0* 38.6 38.8  MCV 92.3 90.8 88.6 93.9 93.3  PLT 218 238 214 279 311   Cardiac Enzymes: No results for input(s): "CKTOTAL", "CKMB", "CKMBINDEX", "TROPONINI" in the last 168 hours. BNP: Invalid input(s): "POCBNP" CBG: Recent Labs  Lab 04/04/22 1202 04/04/22 1620 04/04/22 2146 04/05/22 0727 04/05/22 1155  GLUCAP 246* 247* 269* 220* 177*   D-Dimer No results for input(s): "DDIMER" in the last 72 hours. Hgb A1c No results for input(s): "HGBA1C" in the last 72 hours. Lipid Profile No results for  input(s): "CHOL", "HDL", "LDLCALC", "TRIG", "CHOLHDL", "LDLDIRECT" in the last 72 hours. Thyroid function studies No results for input(s): "TSH", "T4TOTAL", "T3FREE", "THYROIDAB" in the last 72 hours.  Invalid input(s): "FREET3" Anemia work up No results for input(s): "VITAMINB12", "FOLATE", "FERRITIN", "TIBC", "IRON", "RETICCTPCT" in the last 72 hours. Urinalysis    Component Value  Date/Time   COLORURINE YELLOW 03/29/2022 0805   APPEARANCEUR CLEAR 03/29/2022 0805   LABSPEC 1.046 (H) 03/29/2022 0805   PHURINE 5.5 03/29/2022 0805   GLUCOSEU >1,000 (A) 03/29/2022 0805   HGBUR NEGATIVE 03/29/2022 0805   BILIRUBINUR NEGATIVE 03/29/2022 0805   BILIRUBINUR neg 02/04/2014 1032   KETONESUR >80 (A) 03/29/2022 0805   PROTEINUR 30 (A) 03/29/2022 0805   UROBILINOGEN negative 02/04/2014 1032   UROBILINOGEN 0.2 02/25/2012 0127   NITRITE NEGATIVE 03/29/2022 0805   LEUKOCYTESUR MODERATE (A) 03/29/2022 0805   Sepsis Labs Recent Labs  Lab 04/02/22 0451 04/03/22 0446 04/04/22 0509 04/05/22 0457  WBC 14.9* 15.1* 21.6* 17.3*   Microbiology Recent Results (from the past 240 hour(s))  Urine Culture     Status: Abnormal   Collection Time: 03/29/22  8:35 AM   Specimen: Urine, Clean Catch  Result Value Ref Range Status   Specimen Description   Final    URINE, CLEAN CATCH Performed at [facility], [address], [place], [place] [address]    Special Requests   Final    NONE Performed at [facility], [address], [place], [place] [address]    Culture MULTIPLE SPECIES PRESENT, SUGGEST RECOLLECTION (A)  Final   Report Status 03/30/2022 FINAL  Final  MRSA Next Gen by PCR, Nasal     Status: None   Collection Time: 03/29/22  2:04 PM   Specimen: Nasal Mucosa; Nasal Swab  Result Value Ref Range Status   MRSA by PCR Next Gen NOT DETECTED NOT DETECTED Final    Comment: (NOTE) The GeneXpert MRSA Assay (FDA approved for NASAL specimens only), is one  component of a comprehensive MRSA colonization surveillance program. It is not intended to diagnose MRSA infection nor to guide or monitor treatment for MRSA infections. Test performance is not FDA approved in patients less than 67 years old. Performed at [facility], [place] [employer] [place] [address] [place], [place] [address]   Aerobic/Anaerobic Culture w Gram Stain (surgical/deep wound)     Status: None   Collection Time: 03/30/22  8:37 AM   Specimen: PATH Other; Tissue  Result Value Ref Range Status   Specimen Description   Final    TISSUE Performed at [facility] [address], [place], [place] [address]    Special Requests   Final    TISSUE Performed at [facility], [place] [address], [place], [place] [address]    Gram Stain   Final    NO SQUAMOUS EPITHELIAL CELLS SEEN FEW WBC SEEN MODERATE GRAM POSITIVE COCCI MODERATE GRAM NEGATIVE RODS    Culture   Final    ABUNDANT KLEBSIELLA OXYTOCA ABUNDANT GROUP B STREP(S.AGALACTIAE)ISOLATED TESTING AGAINST S. AGALACTIAE NOT ROUTINELY PERFORMED DUE TO PREDICTABILITY OF AMP/PEN/VAN SUSCEPTIBILITY. ABUNDANT ENTEROCOCCUS FAECALIS ABUNDANT ESCHERICHIA COLI ABUNDANT PREVOTELLA BIVIA BETA LACTAMASE POSITIVE Performed at [facility], [place] [address], [place], [place] [address]    Report Status 04/02/2022 FINAL  Final   Organism ID, Bacteria KLEBSIELLA OXYTOCA  Final   Organism ID, Bacteria ENTEROCOCCUS FAECALIS  Final   Organism ID, Bacteria ESCHERICHIA COLI  Final      Susceptibility   Escherichia coli - MIC*    AMPICILLIN 4 SENSITIVE Sensitive     CEFAZOLIN <=4 SENSITIVE Sensitive     CEFEPIME <=0.12 SENSITIVE Sensitive     CEFTAZIDIME <=1 SENSITIVE Sensitive     CEFTRIAXONE <=0.25 SENSITIVE Sensitive     CIPROFLOXACIN <=0.25 SENSITIVE Sensitive  GENTAMICIN <=1 SENSITIVE Sensitive     IMIPENEM <=0.25 SENSITIVE Sensitive     TRIMETH/SULFA <=20 SENSITIVE Sensitive      AMPICILLIN/SULBACTAM <=2 SENSITIVE Sensitive     PIP/TAZO <=4 SENSITIVE Sensitive     * ABUNDANT ESCHERICHIA COLI   Enterococcus faecalis - MIC*    AMPICILLIN <=2 SENSITIVE Sensitive     VANCOMYCIN 2 SENSITIVE Sensitive     GENTAMICIN SYNERGY SENSITIVE Sensitive     * ABUNDANT ENTEROCOCCUS FAECALIS   Klebsiella oxytoca - MIC*    AMPICILLIN RESISTANT Resistant     CEFAZOLIN <=4 SENSITIVE Sensitive     CEFEPIME <=0.12 SENSITIVE Sensitive     CEFTAZIDIME <=1 SENSITIVE Sensitive     CEFTRIAXONE <=0.25 SENSITIVE Sensitive     CIPROFLOXACIN <=0.25 SENSITIVE Sensitive     GENTAMICIN <=1 SENSITIVE Sensitive     IMIPENEM <=0.25 SENSITIVE Sensitive     TRIMETH/SULFA <=20 SENSITIVE Sensitive     AMPICILLIN/SULBACTAM 4 SENSITIVE Sensitive     PIP/TAZO <=4 SENSITIVE Sensitive     * ABUNDANT KLEBSIELLA OXYTOCA  Culture, blood (Routine X 2) w Reflex to ID Panel     Status: None (Preliminary result)   Collection Time: 04/01/22  5:45 PM   Specimen: BLOOD  Result Value Ref Range Status   Specimen Description   Final    BLOOD BLOOD RIGHT HAND Performed at [facility], [place] [address] [place], [place] [address]    Special Requests   Final    IN PEDIATRIC BOTTLE Blood Culture adequate volume Performed at [facility] [address], [place], [place] [address]    Culture   Final    NO GROWTH 4 DAYS Performed at [facility], [place] [address] [place], [place] [address]    Report Status PENDING  Incomplete  Culture, blood (Routine X 2) w Reflex to ID Panel     Status: None (Preliminary result)   Collection Time: 04/01/22  5:45 PM   Specimen: BLOOD  Result Value Ref Range Status   Specimen Description   Final    BLOOD BLOOD LEFT HAND Performed at [facility] [address], [place], [place] [address]    Special Requests   Final    IN PEDIATRIC BOTTLE Blood Culture adequate volume Performed at [facility] [address] [place], [place] [address]    Culture   Final    NO GROWTH 4 DAYS Performed at [facility], [place] [address], [place], [place] [address]    Report Status PENDING  Incomplete  Aerobic/Anaerobic Culture w Gram Stain (surgical/deep wound)     Status: None (Preliminary result)   Collection Time: 04/03/22  4:39 PM   Specimen: Foot, Right; Abscess  Result Value Ref Range Status   Specimen Description   Final    ABSCESS Performed at [facility] [address] [place], [place] [address]    Special Requests RIGHT FOOT  Final   Gram Stain   Final    RARE WBC PRESENT, PREDOMINANTLY MONONUCLEAR NO ORGANISMS SEEN    Culture   Final    RARE GROUP B STREP(S.AGALACTIAE)ISOLATED RARE GRAM NEGATIVE RODS HOLDING FOR POSSIBLE ANAEROBE Performed at [facility], [place] [address], [place] [address]    Report Status PENDING  Incomplete     Time coordinating discharge:  I have spent 35 minutes face to face with the patient and on the ward discussing the patients care,  assessment, plan and disposition with other care givers. >50% of the time was devoted counseling the patient about the risks and benefits of treatment/Discharge disposition and coordinating care.   SIGNED:   Damita Lack, MD  [facility] 04/05/2022, 2:14 PM   If 7PM-7AM, please contact night-coverage

## 2022-04-05 NOTE — TOC Transition Note (Signed)
Transition of Care [facility]) - CM/SW Discharge Note   Patient Details  Name: Courtney Parks MRN: [id number] Date of Birth: April 21, 1961  Transition of Care [facility]) CM/SW Contact:  Servando Snare, LCSW Phone Number: 04/05/2022, 1:14 PM   Clinical Narrative:   Patient to go home with home health and wound vac. Need Changes MWF. HH arranged with [facility] to begin Monday. Home wound vac to be delivered to room by [facility].     Final next level of care:  Barriers to Discharge: No Barriers Identified   Patient Goals and CMS Choice Patient states their goals for this hospitalization and ongoing recovery are:: Go home CMS Medicare.gov Compare Post Acute Care list provided to:: Patient Choice offered to / list presented to : NA  Discharge Placement                       Discharge Plan and Services                DME Arranged: Negative pressure wound device DME Agency: [employer] Date DME Agency Contacted: 04/05/22 Time DME Agency Contacted: [phone] Representative spoke with at DME Agency: Olivia Mackie HH Arranged: RN [place] Agency: [place] ([place]) Date [place]: 04/05/22 Time [place]: 0106 Representative spoke with at [place]: [place] ([facility]) Interventions     Readmission Risk Interventions     No data to display

## 2022-04-05 NOTE — Progress Notes (Signed)
Provided patient with AVS discharge instructions and answered any and all questions. Switched out patient's wound vac for portable wound vac and educated patient on use. Patient was a pleasure to take care of.

## 2022-04-06 LAB — CULTURE, BLOOD (ROUTINE X 2)
Culture: NO GROWTH
Culture: NO GROWTH
Special Requests: ADEQUATE
Special Requests: ADEQUATE

## 2022-04-07 LAB — AEROBIC/ANAEROBIC CULTURE W GRAM STAIN (SURGICAL/DEEP WOUND)

## 2022-04-08 ENCOUNTER — Other Ambulatory Visit (HOSPITAL_COMMUNITY): Payer: Self-pay

## 2022-04-08 DIAGNOSIS — E1110 Type 2 diabetes mellitus with ketoacidosis without coma: Secondary | ICD-10-CM | POA: Diagnosis not present

## 2022-04-08 DIAGNOSIS — N390 Urinary tract infection, site not specified: Secondary | ICD-10-CM | POA: Diagnosis not present

## 2022-04-09 ENCOUNTER — Telehealth: Payer: Self-pay

## 2022-04-10 ENCOUNTER — Encounter: Payer: Self-pay | Admitting: Internal Medicine

## 2022-04-10 DIAGNOSIS — F32A Depression, unspecified: Secondary | ICD-10-CM | POA: Diagnosis not present

## 2022-04-10 DIAGNOSIS — Z791 Long term (current) use of non-steroidal anti-inflammatories (NSAID): Secondary | ICD-10-CM | POA: Diagnosis not present

## 2022-04-10 DIAGNOSIS — L03115 Cellulitis of right lower limb: Secondary | ICD-10-CM | POA: Diagnosis not present

## 2022-04-10 DIAGNOSIS — N390 Urinary tract infection, site not specified: Secondary | ICD-10-CM | POA: Diagnosis not present

## 2022-04-10 DIAGNOSIS — E7800 Pure hypercholesterolemia, unspecified: Secondary | ICD-10-CM | POA: Diagnosis not present

## 2022-04-10 DIAGNOSIS — Z9181 History of falling: Secondary | ICD-10-CM | POA: Diagnosis not present

## 2022-04-10 DIAGNOSIS — F419 Anxiety disorder, unspecified: Secondary | ICD-10-CM | POA: Diagnosis not present

## 2022-04-10 DIAGNOSIS — B951 Streptococcus, group B, as the cause of diseases classified elsewhere: Secondary | ICD-10-CM | POA: Diagnosis not present

## 2022-04-10 DIAGNOSIS — I10 Essential (primary) hypertension: Secondary | ICD-10-CM | POA: Diagnosis not present

## 2022-04-10 DIAGNOSIS — Z794 Long term (current) use of insulin: Secondary | ICD-10-CM | POA: Diagnosis not present

## 2022-04-10 DIAGNOSIS — N200 Calculus of kidney: Secondary | ICD-10-CM | POA: Diagnosis not present

## 2022-04-10 DIAGNOSIS — L02611 Cutaneous abscess of right foot: Secondary | ICD-10-CM | POA: Diagnosis not present

## 2022-04-10 DIAGNOSIS — Z87891 Personal history of nicotine dependence: Secondary | ICD-10-CM | POA: Diagnosis not present

## 2022-04-10 DIAGNOSIS — E1165 Type 2 diabetes mellitus with hyperglycemia: Secondary | ICD-10-CM | POA: Diagnosis not present

## 2022-04-10 DIAGNOSIS — Z7985 Long-term (current) use of injectable non-insulin antidiabetic drugs: Secondary | ICD-10-CM | POA: Diagnosis not present

## 2022-04-10 DIAGNOSIS — S91331D Puncture wound without foreign body, right foot, subsequent encounter: Secondary | ICD-10-CM | POA: Diagnosis not present

## 2022-04-10 NOTE — Telephone Encounter (Signed)
Message has been sent to the provider

## 2022-04-12 ENCOUNTER — Encounter (HOSPITAL_COMMUNITY): Payer: Self-pay | Admitting: Podiatry

## 2022-04-12 ENCOUNTER — Telehealth: Payer: Self-pay | Admitting: *Deleted

## 2022-04-12 DIAGNOSIS — N390 Urinary tract infection, site not specified: Secondary | ICD-10-CM | POA: Diagnosis not present

## 2022-04-12 DIAGNOSIS — F419 Anxiety disorder, unspecified: Secondary | ICD-10-CM | POA: Diagnosis not present

## 2022-04-12 DIAGNOSIS — Z794 Long term (current) use of insulin: Secondary | ICD-10-CM | POA: Diagnosis not present

## 2022-04-12 DIAGNOSIS — L02611 Cutaneous abscess of right foot: Secondary | ICD-10-CM | POA: Diagnosis not present

## 2022-04-12 DIAGNOSIS — Z9181 History of falling: Secondary | ICD-10-CM | POA: Diagnosis not present

## 2022-04-12 DIAGNOSIS — S91331D Puncture wound without foreign body, right foot, subsequent encounter: Secondary | ICD-10-CM | POA: Diagnosis not present

## 2022-04-12 DIAGNOSIS — L03115 Cellulitis of right lower limb: Secondary | ICD-10-CM | POA: Diagnosis not present

## 2022-04-12 DIAGNOSIS — N200 Calculus of kidney: Secondary | ICD-10-CM | POA: Diagnosis not present

## 2022-04-12 DIAGNOSIS — Z791 Long term (current) use of non-steroidal anti-inflammatories (NSAID): Secondary | ICD-10-CM | POA: Diagnosis not present

## 2022-04-12 DIAGNOSIS — F32A Depression, unspecified: Secondary | ICD-10-CM | POA: Diagnosis not present

## 2022-04-12 DIAGNOSIS — E7800 Pure hypercholesterolemia, unspecified: Secondary | ICD-10-CM | POA: Diagnosis not present

## 2022-04-12 DIAGNOSIS — Z7985 Long-term (current) use of injectable non-insulin antidiabetic drugs: Secondary | ICD-10-CM | POA: Diagnosis not present

## 2022-04-12 DIAGNOSIS — E1165 Type 2 diabetes mellitus with hyperglycemia: Secondary | ICD-10-CM | POA: Diagnosis not present

## 2022-04-12 DIAGNOSIS — B951 Streptococcus, group B, as the cause of diseases classified elsewhere: Secondary | ICD-10-CM | POA: Diagnosis not present

## 2022-04-12 DIAGNOSIS — I10 Essential (primary) hypertension: Secondary | ICD-10-CM | POA: Diagnosis not present

## 2022-04-12 DIAGNOSIS — Z87891 Personal history of nicotine dependence: Secondary | ICD-10-CM | POA: Diagnosis not present

## 2022-04-12 NOTE — Telephone Encounter (Signed)
Ray w/ [facility] OT is wanting to let the physician know that patient is declining the OT,possible PT as well, she just wants home health for the moment.

## 2022-04-15 DIAGNOSIS — Z87891 Personal history of nicotine dependence: Secondary | ICD-10-CM | POA: Diagnosis not present

## 2022-04-15 DIAGNOSIS — Z794 Long term (current) use of insulin: Secondary | ICD-10-CM | POA: Diagnosis not present

## 2022-04-15 DIAGNOSIS — L03115 Cellulitis of right lower limb: Secondary | ICD-10-CM | POA: Diagnosis not present

## 2022-04-15 DIAGNOSIS — B951 Streptococcus, group B, as the cause of diseases classified elsewhere: Secondary | ICD-10-CM | POA: Diagnosis not present

## 2022-04-15 DIAGNOSIS — F32A Depression, unspecified: Secondary | ICD-10-CM | POA: Diagnosis not present

## 2022-04-15 DIAGNOSIS — Z9181 History of falling: Secondary | ICD-10-CM | POA: Diagnosis not present

## 2022-04-15 DIAGNOSIS — N390 Urinary tract infection, site not specified: Secondary | ICD-10-CM | POA: Diagnosis not present

## 2022-04-15 DIAGNOSIS — Z7985 Long-term (current) use of injectable non-insulin antidiabetic drugs: Secondary | ICD-10-CM | POA: Diagnosis not present

## 2022-04-15 DIAGNOSIS — N200 Calculus of kidney: Secondary | ICD-10-CM | POA: Diagnosis not present

## 2022-04-15 DIAGNOSIS — E7800 Pure hypercholesterolemia, unspecified: Secondary | ICD-10-CM | POA: Diagnosis not present

## 2022-04-15 DIAGNOSIS — Z791 Long term (current) use of non-steroidal anti-inflammatories (NSAID): Secondary | ICD-10-CM | POA: Diagnosis not present

## 2022-04-15 DIAGNOSIS — F419 Anxiety disorder, unspecified: Secondary | ICD-10-CM | POA: Diagnosis not present

## 2022-04-15 DIAGNOSIS — L02611 Cutaneous abscess of right foot: Secondary | ICD-10-CM | POA: Diagnosis not present

## 2022-04-15 DIAGNOSIS — S91331D Puncture wound without foreign body, right foot, subsequent encounter: Secondary | ICD-10-CM | POA: Diagnosis not present

## 2022-04-15 DIAGNOSIS — I10 Essential (primary) hypertension: Secondary | ICD-10-CM | POA: Diagnosis not present

## 2022-04-15 DIAGNOSIS — E1165 Type 2 diabetes mellitus with hyperglycemia: Secondary | ICD-10-CM | POA: Diagnosis not present

## 2022-04-15 NOTE — Telephone Encounter (Signed)
Patient called back and stated that her home health nurse, she has a new place on her right foot that developed and they need to know if it needs to be packed with the wound vac or wet to dry dressing.   Rhea Belton [phone]

## 2022-04-15 NOTE — Telephone Encounter (Signed)
Spoke with Courtney Parks and she verbalized understanding of the verbal order given by Dr. Posey Pronto.

## 2022-04-15 NOTE — Telephone Encounter (Signed)
I spoke with Dr. Posey Pronto. He stated that it was ok for Courtney Parks to apply the Wound Vac over the new wound until he see's her on Friday 08/18

## 2022-04-17 DIAGNOSIS — Z791 Long term (current) use of non-steroidal anti-inflammatories (NSAID): Secondary | ICD-10-CM | POA: Diagnosis not present

## 2022-04-17 DIAGNOSIS — N390 Urinary tract infection, site not specified: Secondary | ICD-10-CM | POA: Diagnosis not present

## 2022-04-17 DIAGNOSIS — E1165 Type 2 diabetes mellitus with hyperglycemia: Secondary | ICD-10-CM | POA: Diagnosis not present

## 2022-04-17 DIAGNOSIS — F419 Anxiety disorder, unspecified: Secondary | ICD-10-CM | POA: Diagnosis not present

## 2022-04-17 DIAGNOSIS — Z7985 Long-term (current) use of injectable non-insulin antidiabetic drugs: Secondary | ICD-10-CM | POA: Diagnosis not present

## 2022-04-17 DIAGNOSIS — Z9181 History of falling: Secondary | ICD-10-CM | POA: Diagnosis not present

## 2022-04-17 DIAGNOSIS — Z87891 Personal history of nicotine dependence: Secondary | ICD-10-CM | POA: Diagnosis not present

## 2022-04-17 DIAGNOSIS — L02611 Cutaneous abscess of right foot: Secondary | ICD-10-CM | POA: Diagnosis not present

## 2022-04-17 DIAGNOSIS — F32A Depression, unspecified: Secondary | ICD-10-CM | POA: Diagnosis not present

## 2022-04-17 DIAGNOSIS — S91331D Puncture wound without foreign body, right foot, subsequent encounter: Secondary | ICD-10-CM | POA: Diagnosis not present

## 2022-04-17 DIAGNOSIS — Z794 Long term (current) use of insulin: Secondary | ICD-10-CM | POA: Diagnosis not present

## 2022-04-17 DIAGNOSIS — I10 Essential (primary) hypertension: Secondary | ICD-10-CM | POA: Diagnosis not present

## 2022-04-17 DIAGNOSIS — E7800 Pure hypercholesterolemia, unspecified: Secondary | ICD-10-CM | POA: Diagnosis not present

## 2022-04-17 DIAGNOSIS — N200 Calculus of kidney: Secondary | ICD-10-CM | POA: Diagnosis not present

## 2022-04-17 DIAGNOSIS — B951 Streptococcus, group B, as the cause of diseases classified elsewhere: Secondary | ICD-10-CM | POA: Diagnosis not present

## 2022-04-17 DIAGNOSIS — L03115 Cellulitis of right lower limb: Secondary | ICD-10-CM | POA: Diagnosis not present

## 2022-04-18 ENCOUNTER — Ambulatory Visit (INDEPENDENT_AMBULATORY_CARE_PROVIDER_SITE_OTHER): Payer: BC Managed Care – PPO | Admitting: Internal Medicine

## 2022-04-18 ENCOUNTER — Other Ambulatory Visit: Payer: Self-pay

## 2022-04-18 ENCOUNTER — Encounter: Payer: Self-pay | Admitting: Internal Medicine

## 2022-04-18 VITALS — BP 136/85 | HR 100 | Temp 98.0°F

## 2022-04-18 DIAGNOSIS — L089 Local infection of the skin and subcutaneous tissue, unspecified: Secondary | ICD-10-CM | POA: Diagnosis not present

## 2022-04-18 DIAGNOSIS — E11628 Type 2 diabetes mellitus with other skin complications: Secondary | ICD-10-CM | POA: Diagnosis not present

## 2022-04-18 DIAGNOSIS — E1165 Type 2 diabetes mellitus with hyperglycemia: Secondary | ICD-10-CM | POA: Diagnosis not present

## 2022-04-18 DIAGNOSIS — Z794 Long term (current) use of insulin: Secondary | ICD-10-CM | POA: Diagnosis not present

## 2022-04-18 DIAGNOSIS — Z8639 Personal history of other endocrine, nutritional and metabolic disease: Secondary | ICD-10-CM | POA: Diagnosis not present

## 2022-04-18 DIAGNOSIS — E119 Type 2 diabetes mellitus without complications: Secondary | ICD-10-CM

## 2022-04-18 NOTE — Progress Notes (Signed)
[facility] for Infectious Disease  CHIEF COMPLAINT:    Follow up for diabetic foot infection  SUBJECTIVE:    Courtney Parks is a 61 y.o. female with PMHx as below who presents to the clinic for diabetic foot infection.   Patient is here today for hospital follow up after admission at [facility] 03/29/22-04/05/22 where she was admitted with sepsis due to a right diabetic foot infection status post I&D of multiple fascial planes on 03/29/2022 by podiatry with purulence noted to be emanating from the plantar midfoot that penetrated throughout the plantar fat pad as well as deep to the plantar fascia.  There did not appear to be any bone involvement and MRI was negative for osteomyelitis.  Operative cultures polymicrobial with group B strep, E. coli, Klebsiella, and Prevotella.  She returned to the OR 04/03/2022 with podiatry for repeat I&D where further purulent drainage was found.  Cultures were again polymicrobial.  ABIs were completed as well during the admission which were reassuring without any evidence of significant PAD.  Her diabetes was noted to be uncontrolled with an A1c of 10.3 after she presented with DKA in the setting of her severe infection.  She was discharged home to complete 4 weeks total of antibiotics on Augmentin through 05/01/22.  She is tolerating antibiotics thus far.  Her wound is improving.  She has a wound vac in place. She has a follow up with podiatry tomorrow.   Please see A&P for the details of today's visit and status of the patient's medical problems.   Patient's Medications  New Prescriptions   No medications on file  Previous Medications   ACETAMINOPHEN (TYLENOL) 500 MG TABLET    Take 500 mg by mouth every 6 (six) hours as needed for moderate pain.   AMOXICILLIN-CLAVULANATE (AUGMENTIN) 875-125 MG TABLET    Take 1 tablet by mouth every 12 (twelve) hours for 28 days.   ASPIRIN EC 81 MG TABLET    Take 81 mg by mouth daily. Swallow  whole.   CONTINUOUS BLOOD GLUC SENSOR (FREESTYLE LIBRE 2 SENSOR) MISC    1 each by Does not apply route PRO.   DULAGLUTIDE (TRULICITY) 1.5 SW/5.4OE SOPN    Inject 1.5 mg into the skin once a week.   IBUPROFEN (ADVIL) 200 MG TABLET    Take 200-800 mg by mouth every 6 (six) hours as needed for moderate pain.   INSULIN GLARGINE, 1 UNIT DIAL, (TOUJEO SOLOSTAR) 300 UNIT/ML SOLOSTAR PEN    Inject 42 Units into the skin daily in the afternoon.   INSULIN PEN NEEDLE (PEN NEEDLES 3/16") 31G X 5 MM MISC    100 each by Does not apply route 4 (four) times daily.   MULTIPLE VITAMIN (MULTIVITAMIN) CAPSULE    Take 1 capsule by mouth every other day.   PROBIOTIC PRODUCT (PROBIOTIC BLEND PO)    Take by mouth.   SENNA-DOCUSATE (SENOKOT-S) 8.6-50 MG TABLET    Take 1 tablet by mouth at bedtime as needed for moderate constipation.  Modified Medications   No medications on file  Discontinued Medications   OXYCODONE (OXY IR/ROXICODONE) 5 MG IMMEDIATE RELEASE TABLET    Take 1 tablet (5 mg total) by mouth every 4 (four) hours as needed for breakthrough pain or moderate pain.      Past Medical History:  Diagnosis Date   Anxiety    Depression    Diabetes mellitus, type II ([place])    Hyperlipidemia  Hypertension    Kidney stones     Social History   Tobacco Use   Smoking status: Former    Years: 5.00    Types: Cigarettes    Quit date: 12/12/1983    Years since quitting: 38.3   Smokeless tobacco: Never  Substance Use Topics   Alcohol use: No   Drug use: No    Family History  Problem Relation Age of Onset   Diabetes Mother    Hypertension Mother    Colon cancer Mother    Hypertension Father    ADD / ADHD Maternal Grandmother     Allergies  Allergen Reactions   Codeine     REACTION: Nausea   Demerol Nausea And Vomiting   Other Other (See Comments)    Narcotics- drops her blood pressure   Shellfish Allergy Swelling    Review of Systems  All other systems reviewed and are negative.  Except  as noted above.   OBJECTIVE:    Vitals:   04/18/22 1501  BP: 136/85  Pulse: 100  Temp: 98 F (36.7 C)  TempSrc: Oral  SpO2: 96%   There is no height or weight on file to calculate BMI.  Physical Exam Constitutional:      General: She is not in acute distress.    Appearance: Normal appearance.  HENT:     Head: Normocephalic and atraumatic.  Pulmonary:     Effort: Pulmonary effort is normal. No respiratory distress.  Musculoskeletal:     Comments: Wound vac in place to right foot.   Skin:    General: Skin is warm and dry.  Neurological:     General: No focal deficit present.     Mental Status: She is alert and oriented to person, place, and time.  Psychiatric:        Mood and Affect: Mood normal.        Behavior: Behavior normal.      Labs and Microbiology:    Latest Ref Rng & Units 04/05/2022    4:57 AM 04/04/2022    5:09 AM 04/03/2022    4:46 AM  CBC  WBC 4.0 - 10.5 K/uL 17.3  21.6  15.1   Hemoglobin 12.0 - 15.0 g/dL 12.2  12.4  10.7   Hematocrit 36.0 - 46.0 % 38.8  38.6  32.0   Platelets 150 - 400 K/uL 311  279  214       Latest Ref Rng & Units 04/05/2022    4:57 AM 04/04/2022    5:09 AM 04/03/2022    4:46 AM  CMP  Glucose 70 - 99 mg/dL 248  238  89   BUN 6 - 20 mg/dL '10  8  5   '$ Creatinine 0.44 - 1.00 mg/dL 0.51  0.51  0.38   Sodium 135 - 145 mmol/L 136  136  138   Potassium 3.5 - 5.1 mmol/L 3.8  4.3  3.3   Chloride 98 - 111 mmol/L 102  103  104   CO2 22 - 32 mmol/L '24  20  27   '$ Calcium 8.9 - 10.3 mg/dL 8.4  8.1  8.0      No results found for this or any previous visit (from the past 240 hour(s)).     ASSESSMENT & PLAN:    Diabetic foot infection ([place]) She has an extensive diabetic foot infection that was polymicrobial s/p I&D x 2 with podiatry.  Infection was fairly deep involving the fascia but did not appear to  involve the bone based on imaging and operative findings.  Will continue Augmentin 875 BID x 4 weeks from date of her OR on 8/2 through  05/01/22.  She will continue with podiatry follow up and wound care.  Will plan to see her back again in about 4 weeks to ensure healing appropriately after finishing antibiotic therapy.   Will repeat labs today.    Type 2 diabetes mellitus without complications ([place]) She is working on her glycemic control and discussed the importance with wound healing.    Orders Placed This Encounter  Procedures   CBC   Basic metabolic panel    Order Specific Question:   Has the patient fasted?    Answer:   No     [facility] for Infectious [facility] 04/18/2022, 3:09 PM

## 2022-04-18 NOTE — Assessment & Plan Note (Signed)
She is working on her glycemic control and discussed the importance with wound healing.

## 2022-04-18 NOTE — Assessment & Plan Note (Signed)
She has an extensive diabetic foot infection that was polymicrobial s/p I&D x 2 with podiatry.  Infection was fairly deep involving the fascia but did not appear to involve the bone based on imaging and operative findings.  Will continue Augmentin 875 BID x 4 weeks from date of her OR on 8/2 through 05/01/22.  She will continue with podiatry follow up and wound care.  Will plan to see her back again in about 4 weeks to ensure healing appropriately after finishing antibiotic therapy.   Will repeat labs today.

## 2022-04-18 NOTE — Patient Instructions (Signed)
Continue Augmentin until done with current prescription around 05/01/22.  Follow up with podiatry and wound care.  Labs today.

## 2022-04-19 ENCOUNTER — Ambulatory Visit (INDEPENDENT_AMBULATORY_CARE_PROVIDER_SITE_OTHER): Payer: Self-pay | Admitting: Podiatry

## 2022-04-19 DIAGNOSIS — L97512 Non-pressure chronic ulcer of other part of right foot with fat layer exposed: Secondary | ICD-10-CM

## 2022-04-19 DIAGNOSIS — Z794 Long term (current) use of insulin: Secondary | ICD-10-CM

## 2022-04-19 DIAGNOSIS — E119 Type 2 diabetes mellitus without complications: Secondary | ICD-10-CM

## 2022-04-19 LAB — CBC
HCT: 40.3 % (ref 35.0–45.0)
Hemoglobin: 13.1 g/dL (ref 11.7–15.5)
MCH: 29 pg (ref 27.0–33.0)
MCHC: 32.5 g/dL (ref 32.0–36.0)
MCV: 89.2 fL (ref 80.0–100.0)
MPV: 9.9 fL (ref 7.5–12.5)
Platelets: 342 10*3/uL (ref 140–400)
RBC: 4.52 10*6/uL (ref 3.80–5.10)
RDW: 12.8 % (ref 11.0–15.0)
WBC: 8.6 10*3/uL (ref 3.8–10.8)

## 2022-04-19 LAB — BASIC METABOLIC PANEL
BUN: 11 mg/dL (ref 7–25)
CO2: 26 mmol/L (ref 20–32)
Calcium: 10 mg/dL (ref 8.6–10.4)
Chloride: 100 mmol/L (ref 98–110)
Creat: 0.5 mg/dL (ref 0.50–1.05)
Glucose, Bld: 187 mg/dL — ABNORMAL HIGH (ref 65–99)
Potassium: 4.2 mmol/L (ref 3.5–5.3)
Sodium: 136 mmol/L (ref 135–146)

## 2022-04-19 NOTE — Progress Notes (Unsigned)
Subjective:  Patient ID: Courtney Parks, female    DOB: 1961-03-06,  MRN: [id number]  Chief Complaint  Patient presents with   Routine Post Op    1IRRIGATION AND DEBRIDEMENT RIGHT FOOT WITH WOUND VAC    DOS: 04/03/2022 Procedure: Incision and drainage debridement and washout of the right foot  61 y.o. female returns for post-op check.  Patient states she is doing well.  The wound VAC has helped considerably.  She denies any other acute complaints.  Bandages clean dry and intact.  It is becoming more granular in nature.  Review of Systems: Negative except as noted in the HPI. Denies N/V/F/Ch.  Past Medical History:  Diagnosis Date   Anxiety    Depression    Diabetes mellitus, type II ([place])    Hyperlipidemia    Hypertension    Kidney stones     Current Outpatient Medications:    acetaminophen (TYLENOL) 500 MG tablet, Take 500 mg by mouth every 6 (six) hours as needed for moderate pain., Disp: , Rfl:    amoxicillin-clavulanate (AUGMENTIN) 875-125 MG tablet, Take 1 tablet by mouth every 12 (twelve) hours for 28 days., Disp: 56 tablet, Rfl: 0   aspirin EC 81 MG tablet, Take 81 mg by mouth daily. Swallow whole., Disp: , Rfl:    Continuous Blood Gluc Sensor (FREESTYLE LIBRE 2 SENSOR) MISC, 1 each by Does not apply route PRO., Disp: 1 each, Rfl: 4   Dulaglutide (TRULICITY) 1.5 PT/4.6FK SOPN, Inject 1.5 mg into the skin once a week., Disp: , Rfl:    ibuprofen (ADVIL) 200 MG tablet, Take 200-800 mg by mouth every 6 (six) hours as needed for moderate pain., Disp: , Rfl:    insulin glargine, 1 Unit Dial, (TOUJEO SOLOSTAR) 300 UNIT/ML Solostar Pen, Inject 42 Units into the skin daily in the afternoon., Disp: 1.5 mL, Rfl: 1   Insulin Pen Needle (PEN NEEDLES 3/16") 31G X 5 MM MISC, 100 each by Does not apply route 4 (four) times daily., Disp: 100 each, Rfl: 3   Multiple Vitamin (MULTIVITAMIN) capsule, Take 1 capsule by mouth every other day., Disp: , Rfl:    Probiotic Product  (PROBIOTIC BLEND PO), Take by mouth., Disp: , Rfl:    senna-docusate (SENOKOT-S) 8.6-50 MG tablet, Take 1 tablet by mouth at bedtime as needed for moderate constipation., Disp: 60 tablet, Rfl: 1  Social History   Tobacco Use  Smoking Status Former   Years: 5.00   Types: Cigarettes   Quit date: 12/12/1983   Years since quitting: 38.3  Smokeless Tobacco Never    Allergies  Allergen Reactions   Codeine     REACTION: Nausea   Demerol Nausea And Vomiting   Other Other (See Comments)    Narcotics- drops her blood pressure   Shellfish Allergy Swelling   Objective:  There were no vitals filed for this visit. There is no height or weight on file to calculate BMI. Constitutional Well developed. Well nourished.  Vascular Foot warm and well perfused. Capillary refill normal to all digits.   Neurologic Normal speech. Oriented to person, place, and time. Epicritic sensation to light touch grossly present bilaterally.  Dermatologic Skin healing well without signs of infection. Skin edges well coapted without signs of infection.  Orthopedic: Tenderness to palpation noted about the surgical site.   Radiographs: None Assessment:   1. Ulcerated, foot, right, with fat layer exposed ([place])   2. Type 2 diabetes mellitus without complication, with long-term current use of insulin ([place])  Plan:  Patient was evaluated and treated and all questions answered.  S/p foot surgery right -Progressing as expected post-operatively. -XR: See above -WB Status: Nonweightbearing in right lower extremity -Sutures: Intact.  No clinical signs of Deis is noted.  No complication noted. -Medications: None -Foot redressed. -The right foot wound measurements 4 cm x 4 cm x 0.8 cm.  -continue wound VAC changes  No follow-ups on file.   4 x 4 wound.  Decreasing in size.

## 2022-04-22 DIAGNOSIS — E1165 Type 2 diabetes mellitus with hyperglycemia: Secondary | ICD-10-CM | POA: Diagnosis not present

## 2022-04-22 DIAGNOSIS — F32A Depression, unspecified: Secondary | ICD-10-CM | POA: Diagnosis not present

## 2022-04-22 DIAGNOSIS — I10 Essential (primary) hypertension: Secondary | ICD-10-CM | POA: Diagnosis not present

## 2022-04-22 DIAGNOSIS — B951 Streptococcus, group B, as the cause of diseases classified elsewhere: Secondary | ICD-10-CM | POA: Diagnosis not present

## 2022-04-22 DIAGNOSIS — Z794 Long term (current) use of insulin: Secondary | ICD-10-CM | POA: Diagnosis not present

## 2022-04-22 DIAGNOSIS — S91331D Puncture wound without foreign body, right foot, subsequent encounter: Secondary | ICD-10-CM | POA: Diagnosis not present

## 2022-04-22 DIAGNOSIS — Z87891 Personal history of nicotine dependence: Secondary | ICD-10-CM | POA: Diagnosis not present

## 2022-04-22 DIAGNOSIS — L03115 Cellulitis of right lower limb: Secondary | ICD-10-CM | POA: Diagnosis not present

## 2022-04-22 DIAGNOSIS — Z7985 Long-term (current) use of injectable non-insulin antidiabetic drugs: Secondary | ICD-10-CM | POA: Diagnosis not present

## 2022-04-22 DIAGNOSIS — L02611 Cutaneous abscess of right foot: Secondary | ICD-10-CM | POA: Diagnosis not present

## 2022-04-22 DIAGNOSIS — Z791 Long term (current) use of non-steroidal anti-inflammatories (NSAID): Secondary | ICD-10-CM | POA: Diagnosis not present

## 2022-04-22 DIAGNOSIS — E7800 Pure hypercholesterolemia, unspecified: Secondary | ICD-10-CM | POA: Diagnosis not present

## 2022-04-22 DIAGNOSIS — Z9181 History of falling: Secondary | ICD-10-CM | POA: Diagnosis not present

## 2022-04-22 DIAGNOSIS — N200 Calculus of kidney: Secondary | ICD-10-CM | POA: Diagnosis not present

## 2022-04-22 DIAGNOSIS — F419 Anxiety disorder, unspecified: Secondary | ICD-10-CM | POA: Diagnosis not present

## 2022-04-22 DIAGNOSIS — N390 Urinary tract infection, site not specified: Secondary | ICD-10-CM | POA: Diagnosis not present

## 2022-04-24 DIAGNOSIS — I10 Essential (primary) hypertension: Secondary | ICD-10-CM | POA: Diagnosis not present

## 2022-04-24 DIAGNOSIS — L02611 Cutaneous abscess of right foot: Secondary | ICD-10-CM | POA: Diagnosis not present

## 2022-04-24 DIAGNOSIS — N200 Calculus of kidney: Secondary | ICD-10-CM | POA: Diagnosis not present

## 2022-04-24 DIAGNOSIS — F32A Depression, unspecified: Secondary | ICD-10-CM | POA: Diagnosis not present

## 2022-04-24 DIAGNOSIS — Z9181 History of falling: Secondary | ICD-10-CM | POA: Diagnosis not present

## 2022-04-24 DIAGNOSIS — N390 Urinary tract infection, site not specified: Secondary | ICD-10-CM | POA: Diagnosis not present

## 2022-04-24 DIAGNOSIS — Z87891 Personal history of nicotine dependence: Secondary | ICD-10-CM | POA: Diagnosis not present

## 2022-04-24 DIAGNOSIS — S91331D Puncture wound without foreign body, right foot, subsequent encounter: Secondary | ICD-10-CM | POA: Diagnosis not present

## 2022-04-24 DIAGNOSIS — Z794 Long term (current) use of insulin: Secondary | ICD-10-CM | POA: Diagnosis not present

## 2022-04-24 DIAGNOSIS — B951 Streptococcus, group B, as the cause of diseases classified elsewhere: Secondary | ICD-10-CM | POA: Diagnosis not present

## 2022-04-24 DIAGNOSIS — Z791 Long term (current) use of non-steroidal anti-inflammatories (NSAID): Secondary | ICD-10-CM | POA: Diagnosis not present

## 2022-04-24 DIAGNOSIS — F419 Anxiety disorder, unspecified: Secondary | ICD-10-CM | POA: Diagnosis not present

## 2022-04-24 DIAGNOSIS — Z7985 Long-term (current) use of injectable non-insulin antidiabetic drugs: Secondary | ICD-10-CM | POA: Diagnosis not present

## 2022-04-24 DIAGNOSIS — E7800 Pure hypercholesterolemia, unspecified: Secondary | ICD-10-CM | POA: Diagnosis not present

## 2022-04-24 DIAGNOSIS — E1165 Type 2 diabetes mellitus with hyperglycemia: Secondary | ICD-10-CM | POA: Diagnosis not present

## 2022-04-24 DIAGNOSIS — L03115 Cellulitis of right lower limb: Secondary | ICD-10-CM | POA: Diagnosis not present

## 2022-04-26 DIAGNOSIS — I10 Essential (primary) hypertension: Secondary | ICD-10-CM | POA: Diagnosis not present

## 2022-04-26 DIAGNOSIS — F419 Anxiety disorder, unspecified: Secondary | ICD-10-CM | POA: Diagnosis not present

## 2022-04-26 DIAGNOSIS — F32A Depression, unspecified: Secondary | ICD-10-CM | POA: Diagnosis not present

## 2022-04-26 DIAGNOSIS — N200 Calculus of kidney: Secondary | ICD-10-CM | POA: Diagnosis not present

## 2022-04-26 DIAGNOSIS — L02611 Cutaneous abscess of right foot: Secondary | ICD-10-CM | POA: Diagnosis not present

## 2022-04-26 DIAGNOSIS — B951 Streptococcus, group B, as the cause of diseases classified elsewhere: Secondary | ICD-10-CM | POA: Diagnosis not present

## 2022-04-26 DIAGNOSIS — Z791 Long term (current) use of non-steroidal anti-inflammatories (NSAID): Secondary | ICD-10-CM | POA: Diagnosis not present

## 2022-04-26 DIAGNOSIS — Z9181 History of falling: Secondary | ICD-10-CM | POA: Diagnosis not present

## 2022-04-26 DIAGNOSIS — Z87891 Personal history of nicotine dependence: Secondary | ICD-10-CM | POA: Diagnosis not present

## 2022-04-26 DIAGNOSIS — Z7985 Long-term (current) use of injectable non-insulin antidiabetic drugs: Secondary | ICD-10-CM | POA: Diagnosis not present

## 2022-04-26 DIAGNOSIS — L03115 Cellulitis of right lower limb: Secondary | ICD-10-CM | POA: Diagnosis not present

## 2022-04-26 DIAGNOSIS — S91331D Puncture wound without foreign body, right foot, subsequent encounter: Secondary | ICD-10-CM | POA: Diagnosis not present

## 2022-04-26 DIAGNOSIS — E7800 Pure hypercholesterolemia, unspecified: Secondary | ICD-10-CM | POA: Diagnosis not present

## 2022-04-26 DIAGNOSIS — Z794 Long term (current) use of insulin: Secondary | ICD-10-CM | POA: Diagnosis not present

## 2022-04-26 DIAGNOSIS — E1165 Type 2 diabetes mellitus with hyperglycemia: Secondary | ICD-10-CM | POA: Diagnosis not present

## 2022-04-26 DIAGNOSIS — N390 Urinary tract infection, site not specified: Secondary | ICD-10-CM | POA: Diagnosis not present

## 2022-04-29 DIAGNOSIS — S91331D Puncture wound without foreign body, right foot, subsequent encounter: Secondary | ICD-10-CM | POA: Diagnosis not present

## 2022-04-29 DIAGNOSIS — B951 Streptococcus, group B, as the cause of diseases classified elsewhere: Secondary | ICD-10-CM | POA: Diagnosis not present

## 2022-04-29 DIAGNOSIS — L03115 Cellulitis of right lower limb: Secondary | ICD-10-CM | POA: Diagnosis not present

## 2022-04-29 DIAGNOSIS — Z9181 History of falling: Secondary | ICD-10-CM | POA: Diagnosis not present

## 2022-04-29 DIAGNOSIS — E1165 Type 2 diabetes mellitus with hyperglycemia: Secondary | ICD-10-CM | POA: Diagnosis not present

## 2022-04-29 DIAGNOSIS — N200 Calculus of kidney: Secondary | ICD-10-CM | POA: Diagnosis not present

## 2022-04-29 DIAGNOSIS — Z7985 Long-term (current) use of injectable non-insulin antidiabetic drugs: Secondary | ICD-10-CM | POA: Diagnosis not present

## 2022-04-29 DIAGNOSIS — Z87891 Personal history of nicotine dependence: Secondary | ICD-10-CM | POA: Diagnosis not present

## 2022-04-29 DIAGNOSIS — F419 Anxiety disorder, unspecified: Secondary | ICD-10-CM | POA: Diagnosis not present

## 2022-04-29 DIAGNOSIS — L02611 Cutaneous abscess of right foot: Secondary | ICD-10-CM | POA: Diagnosis not present

## 2022-04-29 DIAGNOSIS — Z794 Long term (current) use of insulin: Secondary | ICD-10-CM | POA: Diagnosis not present

## 2022-04-29 DIAGNOSIS — F32A Depression, unspecified: Secondary | ICD-10-CM | POA: Diagnosis not present

## 2022-04-29 DIAGNOSIS — E7800 Pure hypercholesterolemia, unspecified: Secondary | ICD-10-CM | POA: Diagnosis not present

## 2022-04-29 DIAGNOSIS — I10 Essential (primary) hypertension: Secondary | ICD-10-CM | POA: Diagnosis not present

## 2022-04-29 DIAGNOSIS — N390 Urinary tract infection, site not specified: Secondary | ICD-10-CM | POA: Diagnosis not present

## 2022-04-29 DIAGNOSIS — Z791 Long term (current) use of non-steroidal anti-inflammatories (NSAID): Secondary | ICD-10-CM | POA: Diagnosis not present

## 2022-05-01 DIAGNOSIS — S91331D Puncture wound without foreign body, right foot, subsequent encounter: Secondary | ICD-10-CM | POA: Diagnosis not present

## 2022-05-01 DIAGNOSIS — E7800 Pure hypercholesterolemia, unspecified: Secondary | ICD-10-CM | POA: Diagnosis not present

## 2022-05-01 DIAGNOSIS — Z7985 Long-term (current) use of injectable non-insulin antidiabetic drugs: Secondary | ICD-10-CM | POA: Diagnosis not present

## 2022-05-01 DIAGNOSIS — Z87891 Personal history of nicotine dependence: Secondary | ICD-10-CM | POA: Diagnosis not present

## 2022-05-01 DIAGNOSIS — L02611 Cutaneous abscess of right foot: Secondary | ICD-10-CM | POA: Diagnosis not present

## 2022-05-01 DIAGNOSIS — N390 Urinary tract infection, site not specified: Secondary | ICD-10-CM | POA: Diagnosis not present

## 2022-05-01 DIAGNOSIS — L03115 Cellulitis of right lower limb: Secondary | ICD-10-CM | POA: Diagnosis not present

## 2022-05-01 DIAGNOSIS — N200 Calculus of kidney: Secondary | ICD-10-CM | POA: Diagnosis not present

## 2022-05-01 DIAGNOSIS — Z794 Long term (current) use of insulin: Secondary | ICD-10-CM | POA: Diagnosis not present

## 2022-05-01 DIAGNOSIS — E1165 Type 2 diabetes mellitus with hyperglycemia: Secondary | ICD-10-CM | POA: Diagnosis not present

## 2022-05-01 DIAGNOSIS — F419 Anxiety disorder, unspecified: Secondary | ICD-10-CM | POA: Diagnosis not present

## 2022-05-01 DIAGNOSIS — Z791 Long term (current) use of non-steroidal anti-inflammatories (NSAID): Secondary | ICD-10-CM | POA: Diagnosis not present

## 2022-05-01 DIAGNOSIS — F32A Depression, unspecified: Secondary | ICD-10-CM | POA: Diagnosis not present

## 2022-05-01 DIAGNOSIS — Z9181 History of falling: Secondary | ICD-10-CM | POA: Diagnosis not present

## 2022-05-01 DIAGNOSIS — B951 Streptococcus, group B, as the cause of diseases classified elsewhere: Secondary | ICD-10-CM | POA: Diagnosis not present

## 2022-05-01 DIAGNOSIS — I10 Essential (primary) hypertension: Secondary | ICD-10-CM | POA: Diagnosis not present

## 2022-05-03 DIAGNOSIS — Z7985 Long-term (current) use of injectable non-insulin antidiabetic drugs: Secondary | ICD-10-CM | POA: Diagnosis not present

## 2022-05-03 DIAGNOSIS — B951 Streptococcus, group B, as the cause of diseases classified elsewhere: Secondary | ICD-10-CM | POA: Diagnosis not present

## 2022-05-03 DIAGNOSIS — N390 Urinary tract infection, site not specified: Secondary | ICD-10-CM | POA: Diagnosis not present

## 2022-05-03 DIAGNOSIS — I10 Essential (primary) hypertension: Secondary | ICD-10-CM | POA: Diagnosis not present

## 2022-05-03 DIAGNOSIS — Z791 Long term (current) use of non-steroidal anti-inflammatories (NSAID): Secondary | ICD-10-CM | POA: Diagnosis not present

## 2022-05-03 DIAGNOSIS — N200 Calculus of kidney: Secondary | ICD-10-CM | POA: Diagnosis not present

## 2022-05-03 DIAGNOSIS — F419 Anxiety disorder, unspecified: Secondary | ICD-10-CM | POA: Diagnosis not present

## 2022-05-03 DIAGNOSIS — L02611 Cutaneous abscess of right foot: Secondary | ICD-10-CM | POA: Diagnosis not present

## 2022-05-03 DIAGNOSIS — E1165 Type 2 diabetes mellitus with hyperglycemia: Secondary | ICD-10-CM | POA: Diagnosis not present

## 2022-05-03 DIAGNOSIS — F32A Depression, unspecified: Secondary | ICD-10-CM | POA: Diagnosis not present

## 2022-05-03 DIAGNOSIS — L03115 Cellulitis of right lower limb: Secondary | ICD-10-CM | POA: Diagnosis not present

## 2022-05-03 DIAGNOSIS — S91331D Puncture wound without foreign body, right foot, subsequent encounter: Secondary | ICD-10-CM | POA: Diagnosis not present

## 2022-05-03 DIAGNOSIS — Z87891 Personal history of nicotine dependence: Secondary | ICD-10-CM | POA: Diagnosis not present

## 2022-05-03 DIAGNOSIS — Z794 Long term (current) use of insulin: Secondary | ICD-10-CM | POA: Diagnosis not present

## 2022-05-03 DIAGNOSIS — E7800 Pure hypercholesterolemia, unspecified: Secondary | ICD-10-CM | POA: Diagnosis not present

## 2022-05-03 DIAGNOSIS — Z9181 History of falling: Secondary | ICD-10-CM | POA: Diagnosis not present

## 2022-05-05 DIAGNOSIS — T8189XA Other complications of procedures, not elsewhere classified, initial encounter: Secondary | ICD-10-CM | POA: Diagnosis not present

## 2022-05-05 DIAGNOSIS — S91301A Unspecified open wound, right foot, initial encounter: Secondary | ICD-10-CM | POA: Diagnosis not present

## 2022-05-06 DIAGNOSIS — Z794 Long term (current) use of insulin: Secondary | ICD-10-CM | POA: Diagnosis not present

## 2022-05-06 DIAGNOSIS — Z7985 Long-term (current) use of injectable non-insulin antidiabetic drugs: Secondary | ICD-10-CM | POA: Diagnosis not present

## 2022-05-06 DIAGNOSIS — B951 Streptococcus, group B, as the cause of diseases classified elsewhere: Secondary | ICD-10-CM | POA: Diagnosis not present

## 2022-05-06 DIAGNOSIS — T8189XA Other complications of procedures, not elsewhere classified, initial encounter: Secondary | ICD-10-CM | POA: Diagnosis not present

## 2022-05-06 DIAGNOSIS — I10 Essential (primary) hypertension: Secondary | ICD-10-CM | POA: Diagnosis not present

## 2022-05-06 DIAGNOSIS — E7800 Pure hypercholesterolemia, unspecified: Secondary | ICD-10-CM | POA: Diagnosis not present

## 2022-05-06 DIAGNOSIS — N390 Urinary tract infection, site not specified: Secondary | ICD-10-CM | POA: Diagnosis not present

## 2022-05-06 DIAGNOSIS — L03115 Cellulitis of right lower limb: Secondary | ICD-10-CM | POA: Diagnosis not present

## 2022-05-06 DIAGNOSIS — Z87891 Personal history of nicotine dependence: Secondary | ICD-10-CM | POA: Diagnosis not present

## 2022-05-06 DIAGNOSIS — Z9181 History of falling: Secondary | ICD-10-CM | POA: Diagnosis not present

## 2022-05-06 DIAGNOSIS — L02611 Cutaneous abscess of right foot: Secondary | ICD-10-CM | POA: Diagnosis not present

## 2022-05-06 DIAGNOSIS — Z791 Long term (current) use of non-steroidal anti-inflammatories (NSAID): Secondary | ICD-10-CM | POA: Diagnosis not present

## 2022-05-06 DIAGNOSIS — S91301A Unspecified open wound, right foot, initial encounter: Secondary | ICD-10-CM | POA: Diagnosis not present

## 2022-05-06 DIAGNOSIS — N200 Calculus of kidney: Secondary | ICD-10-CM | POA: Diagnosis not present

## 2022-05-06 DIAGNOSIS — S91331D Puncture wound without foreign body, right foot, subsequent encounter: Secondary | ICD-10-CM | POA: Diagnosis not present

## 2022-05-06 DIAGNOSIS — F419 Anxiety disorder, unspecified: Secondary | ICD-10-CM | POA: Diagnosis not present

## 2022-05-06 DIAGNOSIS — F32A Depression, unspecified: Secondary | ICD-10-CM | POA: Diagnosis not present

## 2022-05-06 DIAGNOSIS — E1165 Type 2 diabetes mellitus with hyperglycemia: Secondary | ICD-10-CM | POA: Diagnosis not present

## 2022-05-07 DIAGNOSIS — S91301A Unspecified open wound, right foot, initial encounter: Secondary | ICD-10-CM | POA: Diagnosis not present

## 2022-05-07 DIAGNOSIS — T8189XA Other complications of procedures, not elsewhere classified, initial encounter: Secondary | ICD-10-CM | POA: Diagnosis not present

## 2022-05-08 DIAGNOSIS — N390 Urinary tract infection, site not specified: Secondary | ICD-10-CM | POA: Diagnosis not present

## 2022-05-08 DIAGNOSIS — Z87891 Personal history of nicotine dependence: Secondary | ICD-10-CM | POA: Diagnosis not present

## 2022-05-08 DIAGNOSIS — N200 Calculus of kidney: Secondary | ICD-10-CM | POA: Diagnosis not present

## 2022-05-08 DIAGNOSIS — F32A Depression, unspecified: Secondary | ICD-10-CM | POA: Diagnosis not present

## 2022-05-08 DIAGNOSIS — L02611 Cutaneous abscess of right foot: Secondary | ICD-10-CM | POA: Diagnosis not present

## 2022-05-08 DIAGNOSIS — T8189XA Other complications of procedures, not elsewhere classified, initial encounter: Secondary | ICD-10-CM | POA: Diagnosis not present

## 2022-05-08 DIAGNOSIS — I10 Essential (primary) hypertension: Secondary | ICD-10-CM | POA: Diagnosis not present

## 2022-05-08 DIAGNOSIS — Z9181 History of falling: Secondary | ICD-10-CM | POA: Diagnosis not present

## 2022-05-08 DIAGNOSIS — Z791 Long term (current) use of non-steroidal anti-inflammatories (NSAID): Secondary | ICD-10-CM | POA: Diagnosis not present

## 2022-05-08 DIAGNOSIS — S91331D Puncture wound without foreign body, right foot, subsequent encounter: Secondary | ICD-10-CM | POA: Diagnosis not present

## 2022-05-08 DIAGNOSIS — E7800 Pure hypercholesterolemia, unspecified: Secondary | ICD-10-CM | POA: Diagnosis not present

## 2022-05-08 DIAGNOSIS — Z7985 Long-term (current) use of injectable non-insulin antidiabetic drugs: Secondary | ICD-10-CM | POA: Diagnosis not present

## 2022-05-08 DIAGNOSIS — B951 Streptococcus, group B, as the cause of diseases classified elsewhere: Secondary | ICD-10-CM | POA: Diagnosis not present

## 2022-05-08 DIAGNOSIS — L03115 Cellulitis of right lower limb: Secondary | ICD-10-CM | POA: Diagnosis not present

## 2022-05-08 DIAGNOSIS — F419 Anxiety disorder, unspecified: Secondary | ICD-10-CM | POA: Diagnosis not present

## 2022-05-08 DIAGNOSIS — Z794 Long term (current) use of insulin: Secondary | ICD-10-CM | POA: Diagnosis not present

## 2022-05-08 DIAGNOSIS — E1165 Type 2 diabetes mellitus with hyperglycemia: Secondary | ICD-10-CM | POA: Diagnosis not present

## 2022-05-08 DIAGNOSIS — S91301A Unspecified open wound, right foot, initial encounter: Secondary | ICD-10-CM | POA: Diagnosis not present

## 2022-05-09 DIAGNOSIS — S91301A Unspecified open wound, right foot, initial encounter: Secondary | ICD-10-CM | POA: Diagnosis not present

## 2022-05-09 DIAGNOSIS — T8189XA Other complications of procedures, not elsewhere classified, initial encounter: Secondary | ICD-10-CM | POA: Diagnosis not present

## 2022-05-10 DIAGNOSIS — Z794 Long term (current) use of insulin: Secondary | ICD-10-CM | POA: Diagnosis not present

## 2022-05-10 DIAGNOSIS — L03115 Cellulitis of right lower limb: Secondary | ICD-10-CM | POA: Diagnosis not present

## 2022-05-10 DIAGNOSIS — N200 Calculus of kidney: Secondary | ICD-10-CM | POA: Diagnosis not present

## 2022-05-10 DIAGNOSIS — T8189XA Other complications of procedures, not elsewhere classified, initial encounter: Secondary | ICD-10-CM | POA: Diagnosis not present

## 2022-05-10 DIAGNOSIS — N390 Urinary tract infection, site not specified: Secondary | ICD-10-CM | POA: Diagnosis not present

## 2022-05-10 DIAGNOSIS — Z791 Long term (current) use of non-steroidal anti-inflammatories (NSAID): Secondary | ICD-10-CM | POA: Diagnosis not present

## 2022-05-10 DIAGNOSIS — E7800 Pure hypercholesterolemia, unspecified: Secondary | ICD-10-CM | POA: Diagnosis not present

## 2022-05-10 DIAGNOSIS — F419 Anxiety disorder, unspecified: Secondary | ICD-10-CM | POA: Diagnosis not present

## 2022-05-10 DIAGNOSIS — Z87891 Personal history of nicotine dependence: Secondary | ICD-10-CM | POA: Diagnosis not present

## 2022-05-10 DIAGNOSIS — S91331D Puncture wound without foreign body, right foot, subsequent encounter: Secondary | ICD-10-CM | POA: Diagnosis not present

## 2022-05-10 DIAGNOSIS — L02611 Cutaneous abscess of right foot: Secondary | ICD-10-CM | POA: Diagnosis not present

## 2022-05-10 DIAGNOSIS — S91301A Unspecified open wound, right foot, initial encounter: Secondary | ICD-10-CM | POA: Diagnosis not present

## 2022-05-10 DIAGNOSIS — I10 Essential (primary) hypertension: Secondary | ICD-10-CM | POA: Diagnosis not present

## 2022-05-10 DIAGNOSIS — B951 Streptococcus, group B, as the cause of diseases classified elsewhere: Secondary | ICD-10-CM | POA: Diagnosis not present

## 2022-05-10 DIAGNOSIS — Z9181 History of falling: Secondary | ICD-10-CM | POA: Diagnosis not present

## 2022-05-10 DIAGNOSIS — F32A Depression, unspecified: Secondary | ICD-10-CM | POA: Diagnosis not present

## 2022-05-10 DIAGNOSIS — Z7985 Long-term (current) use of injectable non-insulin antidiabetic drugs: Secondary | ICD-10-CM | POA: Diagnosis not present

## 2022-05-10 DIAGNOSIS — E1165 Type 2 diabetes mellitus with hyperglycemia: Secondary | ICD-10-CM | POA: Diagnosis not present

## 2022-05-11 DIAGNOSIS — T8189XA Other complications of procedures, not elsewhere classified, initial encounter: Secondary | ICD-10-CM | POA: Diagnosis not present

## 2022-05-11 DIAGNOSIS — S91301A Unspecified open wound, right foot, initial encounter: Secondary | ICD-10-CM | POA: Diagnosis not present

## 2022-05-12 DIAGNOSIS — S91301A Unspecified open wound, right foot, initial encounter: Secondary | ICD-10-CM | POA: Diagnosis not present

## 2022-05-12 DIAGNOSIS — T8189XA Other complications of procedures, not elsewhere classified, initial encounter: Secondary | ICD-10-CM | POA: Diagnosis not present

## 2022-05-13 DIAGNOSIS — B951 Streptococcus, group B, as the cause of diseases classified elsewhere: Secondary | ICD-10-CM | POA: Diagnosis not present

## 2022-05-13 DIAGNOSIS — Z9181 History of falling: Secondary | ICD-10-CM | POA: Diagnosis not present

## 2022-05-13 DIAGNOSIS — F32A Depression, unspecified: Secondary | ICD-10-CM | POA: Diagnosis not present

## 2022-05-13 DIAGNOSIS — E7800 Pure hypercholesterolemia, unspecified: Secondary | ICD-10-CM | POA: Diagnosis not present

## 2022-05-13 DIAGNOSIS — S91301A Unspecified open wound, right foot, initial encounter: Secondary | ICD-10-CM | POA: Diagnosis not present

## 2022-05-13 DIAGNOSIS — L03115 Cellulitis of right lower limb: Secondary | ICD-10-CM | POA: Diagnosis not present

## 2022-05-13 DIAGNOSIS — S91331D Puncture wound without foreign body, right foot, subsequent encounter: Secondary | ICD-10-CM | POA: Diagnosis not present

## 2022-05-13 DIAGNOSIS — F419 Anxiety disorder, unspecified: Secondary | ICD-10-CM | POA: Diagnosis not present

## 2022-05-13 DIAGNOSIS — I10 Essential (primary) hypertension: Secondary | ICD-10-CM | POA: Diagnosis not present

## 2022-05-13 DIAGNOSIS — T8189XA Other complications of procedures, not elsewhere classified, initial encounter: Secondary | ICD-10-CM | POA: Diagnosis not present

## 2022-05-13 DIAGNOSIS — Z7985 Long-term (current) use of injectable non-insulin antidiabetic drugs: Secondary | ICD-10-CM | POA: Diagnosis not present

## 2022-05-13 DIAGNOSIS — N390 Urinary tract infection, site not specified: Secondary | ICD-10-CM | POA: Diagnosis not present

## 2022-05-13 DIAGNOSIS — Z791 Long term (current) use of non-steroidal anti-inflammatories (NSAID): Secondary | ICD-10-CM | POA: Diagnosis not present

## 2022-05-13 DIAGNOSIS — Z794 Long term (current) use of insulin: Secondary | ICD-10-CM | POA: Diagnosis not present

## 2022-05-13 DIAGNOSIS — N200 Calculus of kidney: Secondary | ICD-10-CM | POA: Diagnosis not present

## 2022-05-13 DIAGNOSIS — E1165 Type 2 diabetes mellitus with hyperglycemia: Secondary | ICD-10-CM | POA: Diagnosis not present

## 2022-05-13 DIAGNOSIS — Z87891 Personal history of nicotine dependence: Secondary | ICD-10-CM | POA: Diagnosis not present

## 2022-05-13 DIAGNOSIS — L02611 Cutaneous abscess of right foot: Secondary | ICD-10-CM | POA: Diagnosis not present

## 2022-05-14 DIAGNOSIS — T8189XA Other complications of procedures, not elsewhere classified, initial encounter: Secondary | ICD-10-CM | POA: Diagnosis not present

## 2022-05-14 DIAGNOSIS — S91301A Unspecified open wound, right foot, initial encounter: Secondary | ICD-10-CM | POA: Diagnosis not present

## 2022-05-15 ENCOUNTER — Ambulatory Visit (INDEPENDENT_AMBULATORY_CARE_PROVIDER_SITE_OTHER): Payer: BC Managed Care – PPO | Admitting: Podiatry

## 2022-05-15 DIAGNOSIS — Z794 Long term (current) use of insulin: Secondary | ICD-10-CM | POA: Diagnosis not present

## 2022-05-15 DIAGNOSIS — N200 Calculus of kidney: Secondary | ICD-10-CM | POA: Diagnosis not present

## 2022-05-15 DIAGNOSIS — S91331D Puncture wound without foreign body, right foot, subsequent encounter: Secondary | ICD-10-CM | POA: Diagnosis not present

## 2022-05-15 DIAGNOSIS — B951 Streptococcus, group B, as the cause of diseases classified elsewhere: Secondary | ICD-10-CM | POA: Diagnosis not present

## 2022-05-15 DIAGNOSIS — Z9181 History of falling: Secondary | ICD-10-CM | POA: Diagnosis not present

## 2022-05-15 DIAGNOSIS — Z791 Long term (current) use of non-steroidal anti-inflammatories (NSAID): Secondary | ICD-10-CM | POA: Diagnosis not present

## 2022-05-15 DIAGNOSIS — E119 Type 2 diabetes mellitus without complications: Secondary | ICD-10-CM

## 2022-05-15 DIAGNOSIS — L02611 Cutaneous abscess of right foot: Secondary | ICD-10-CM | POA: Diagnosis not present

## 2022-05-15 DIAGNOSIS — Z7985 Long-term (current) use of injectable non-insulin antidiabetic drugs: Secondary | ICD-10-CM | POA: Diagnosis not present

## 2022-05-15 DIAGNOSIS — E7800 Pure hypercholesterolemia, unspecified: Secondary | ICD-10-CM | POA: Diagnosis not present

## 2022-05-15 DIAGNOSIS — S91301A Unspecified open wound, right foot, initial encounter: Secondary | ICD-10-CM | POA: Diagnosis not present

## 2022-05-15 DIAGNOSIS — I10 Essential (primary) hypertension: Secondary | ICD-10-CM | POA: Diagnosis not present

## 2022-05-15 DIAGNOSIS — F419 Anxiety disorder, unspecified: Secondary | ICD-10-CM | POA: Diagnosis not present

## 2022-05-15 DIAGNOSIS — L97512 Non-pressure chronic ulcer of other part of right foot with fat layer exposed: Secondary | ICD-10-CM

## 2022-05-15 DIAGNOSIS — L03115 Cellulitis of right lower limb: Secondary | ICD-10-CM | POA: Diagnosis not present

## 2022-05-15 DIAGNOSIS — F32A Depression, unspecified: Secondary | ICD-10-CM | POA: Diagnosis not present

## 2022-05-15 DIAGNOSIS — N390 Urinary tract infection, site not specified: Secondary | ICD-10-CM | POA: Diagnosis not present

## 2022-05-15 DIAGNOSIS — T8189XA Other complications of procedures, not elsewhere classified, initial encounter: Secondary | ICD-10-CM | POA: Diagnosis not present

## 2022-05-15 DIAGNOSIS — Z87891 Personal history of nicotine dependence: Secondary | ICD-10-CM | POA: Diagnosis not present

## 2022-05-15 DIAGNOSIS — E1165 Type 2 diabetes mellitus with hyperglycemia: Secondary | ICD-10-CM | POA: Diagnosis not present

## 2022-05-15 NOTE — Progress Notes (Signed)
Subjective:  Patient ID: Courtney Parks, female    DOB: 1960/09/21,  MRN: [id number]  Chief Complaint  Patient presents with   Foot Ulcer    DOS: 04/03/2022 Procedure: Incision and drainage debridement and washout of the right foot  61 y.o. female returns for post-op check.  Patient states she is doing well.  The wound VAC has helped considerably.  She denies any other acute complaints.  Bandages clean dry and intact.  It is becoming more granular in nature.  Review of Systems: Negative except as noted in the HPI. Denies N/V/F/Ch.  Past Medical History:  Diagnosis Date   Anxiety    Depression    Diabetes mellitus, type II ([place])    Hyperlipidemia    Hypertension    Kidney stones     Current Outpatient Medications:    acetaminophen (TYLENOL) 500 MG tablet, Take 500 mg by mouth every 6 (six) hours as needed for moderate pain., Disp: , Rfl:    aspirin EC 81 MG tablet, Take 81 mg by mouth daily. Swallow whole., Disp: , Rfl:    Continuous Blood Gluc Sensor (FREESTYLE LIBRE 2 SENSOR) MISC, 1 each by Does not apply route PRO., Disp: 1 each, Rfl: 4   Dulaglutide (TRULICITY) 1.5 IO/2.7OJ SOPN, Inject 1.5 mg into the skin once a week., Disp: , Rfl:    ibuprofen (ADVIL) 200 MG tablet, Take 200-800 mg by mouth every 6 (six) hours as needed for moderate pain., Disp: , Rfl:    insulin glargine, 1 Unit Dial, (TOUJEO SOLOSTAR) 300 UNIT/ML Solostar Pen, Inject 42 Units into the skin daily in the afternoon., Disp: 1.5 mL, Rfl: 1   Insulin Pen Needle (PEN NEEDLES 3/16") 31G X 5 MM MISC, 100 each by Does not apply route 4 (four) times daily., Disp: 100 each, Rfl: 3   Multiple Vitamin (MULTIVITAMIN) capsule, Take 1 capsule by mouth every other day., Disp: , Rfl:    Probiotic Product (PROBIOTIC BLEND PO), Take by mouth., Disp: , Rfl:    senna-docusate (SENOKOT-S) 8.6-50 MG tablet, Take 1 tablet by mouth at bedtime as needed for moderate constipation., Disp: 60 tablet, Rfl: 1  Social History    Tobacco Use  Smoking Status Former   Years: 5.00   Types: Cigarettes   Quit date: 12/12/1983   Years since quitting: 38.4  Smokeless Tobacco Never    Allergies  Allergen Reactions   Codeine     REACTION: Nausea   Demerol Nausea And Vomiting   Other Other (See Comments)    Narcotics- drops her blood pressure   Shellfish Allergy Swelling   Objective:  There were no vitals filed for this visit. There is no height or weight on file to calculate BMI. Constitutional Well developed. Well nourished.  Vascular Foot warm and well perfused. Capillary refill normal to all digits.   Neurologic Normal speech. Oriented to person, place, and time. Epicritic sensation to light touch grossly present bilaterally.  Dermatologic Wound size as below.  Granular wound bed noted some area of fibrotic tissue noted.  Orthopedic: No further tenderness to palpation noted about the surgical site.   Radiographs: None Assessment:   No diagnosis found.  Plan:  Patient was evaluated and treated and all questions answered.  S/p foot surgery right -Progressing as expected post-operatively. -XR: See above -WB Status: Nonweightbearing in right lower extremity -Sutures: Intact.  No clinical signs of Deis is noted.  No complication noted. -Medications: None -Foot redressed. -The right foot wound measurements 3.5 cm x  3.5 cm 0.6 cm -continue wound VAC changes  No follow-ups on file.

## 2022-05-16 ENCOUNTER — Other Ambulatory Visit: Payer: Self-pay

## 2022-05-16 ENCOUNTER — Ambulatory Visit (INDEPENDENT_AMBULATORY_CARE_PROVIDER_SITE_OTHER): Payer: BC Managed Care – PPO | Admitting: Internal Medicine

## 2022-05-16 ENCOUNTER — Encounter: Payer: Self-pay | Admitting: Internal Medicine

## 2022-05-16 DIAGNOSIS — L089 Local infection of the skin and subcutaneous tissue, unspecified: Secondary | ICD-10-CM | POA: Diagnosis not present

## 2022-05-16 DIAGNOSIS — E11628 Type 2 diabetes mellitus with other skin complications: Secondary | ICD-10-CM

## 2022-05-16 DIAGNOSIS — T8189XA Other complications of procedures, not elsewhere classified, initial encounter: Secondary | ICD-10-CM | POA: Diagnosis not present

## 2022-05-16 DIAGNOSIS — S91301A Unspecified open wound, right foot, initial encounter: Secondary | ICD-10-CM | POA: Diagnosis not present

## 2022-05-16 NOTE — Assessment & Plan Note (Signed)
She completed a 4 week course of antibiotics for extensive diabetic foot infection s/p I&D x 2 with podiatry.  Continues to improve as expected post-operatively.  Will continue with wound care and podiatry follow up, but follow up ID as needed now that she has completed antibiotics a little over 2 weeks ago and still doing well.  Happy to see her again as needed.

## 2022-05-16 NOTE — Progress Notes (Signed)
[facility] for Infectious Disease  CHIEF COMPLAINT:    Follow up for diabetic foot infection  SUBJECTIVE:    Courtney Parks is a 61 y.o. female with PMHx as below who presents to the clinic for diabetic foot infection.   Here today for planned follow up.  Admitted at [facility] 03/29/22-04/05/22 where she was admitted with sepsis due to a right diabetic foot infection status post I&D of multiple fascial planes on 03/29/2022 by podiatry with purulence noted to be emanating from the plantar midfoot that penetrated throughout the plantar fat pad as well as deep to the plantar fascia.  There did not appear to be any bone involvement and MRI was negative for osteomyelitis.  Operative cultures polymicrobial with group B strep, E. coli, Klebsiella, and Prevotella.  She returned to the OR 04/03/2022 with podiatry for repeat I&D where further purulent drainage was found.  Cultures were again polymicrobial.  ABIs were completed as well during the admission which were reassuring without any evidence of significant PAD.  Her diabetes was noted to be uncontrolled with an A1c of 10.3 after she presented with DKA in the setting of her severe infection.  She was discharged home to complete 4 weeks total of antibiotics on Augmentin through 05/01/22.  I saw her on 04/18/22 and she was overall doing well. She completed antibiotics as planned.  She saw podiatry yesterday where reported doing well with improved granulation.  She is progressing as expected post-operatively. She has  wound vac nurse that comes out 3 times per week.  She is overall pleased with the progress and showed pictures from yesterday on her phone.   Please see A&P for the details of today's visit and status of the patient's medical problems.   Patient's Medications  New Prescriptions   No medications on file  Previous Medications   ACETAMINOPHEN (TYLENOL) 500 MG TABLET    Take 500 mg by mouth every 6 (six) hours as  needed for moderate pain.   ASPIRIN EC 81 MG TABLET    Take 81 mg by mouth daily. Swallow whole.   CONTINUOUS BLOOD GLUC SENSOR (FREESTYLE LIBRE 2 SENSOR) MISC    1 each by Does not apply route PRO.   DULAGLUTIDE (TRULICITY) 1.5 OB/0.9GG SOPN    Inject 1.5 mg into the skin once a week.   IBUPROFEN (ADVIL) 200 MG TABLET    Take 200-800 mg by mouth every 6 (six) hours as needed for moderate pain.   INSULIN GLARGINE, 1 UNIT DIAL, (TOUJEO SOLOSTAR) 300 UNIT/ML SOLOSTAR PEN    Inject 42 Units into the skin daily in the afternoon.   INSULIN PEN NEEDLE (PEN NEEDLES 3/16") 31G X 5 MM MISC    100 each by Does not apply route 4 (four) times daily.   MULTIPLE VITAMIN (MULTIVITAMIN) CAPSULE    Take 1 capsule by mouth every other day.   PROBIOTIC PRODUCT (PROBIOTIC BLEND PO)    Take by mouth.   SENNA-DOCUSATE (SENOKOT-S) 8.6-50 MG TABLET    Take 1 tablet by mouth at bedtime as needed for moderate constipation.  Modified Medications   No medications on file  Discontinued Medications   No medications on file      Past Medical History:  Diagnosis Date   Anxiety    Depression    Diabetes mellitus, type II ([place])    Hyperlipidemia    Hypertension    Kidney stones     Social History  Tobacco Use   Smoking status: Former    Years: 5.00    Types: Cigarettes    Quit date: 12/12/1983    Years since quitting: 38.4   Smokeless tobacco: Never  Substance Use Topics   Alcohol use: No   Drug use: No    Family History  Problem Relation Age of Onset   Diabetes Mother    Hypertension Mother    Colon cancer Mother    Hypertension Father    ADD / ADHD Maternal Grandmother     Allergies  Allergen Reactions   Codeine     REACTION: Nausea   Demerol Nausea And Vomiting   Other Other (See Comments)    Narcotics- drops her blood pressure   Shellfish Allergy Swelling    Review of Systems  Constitutional: Negative.   Respiratory: Negative.    Cardiovascular: Negative.   Gastrointestinal:  Negative.      OBJECTIVE:    Vitals:   05/16/22 1017  BP: 133/83  Pulse: 88  Temp: 97.9 F (36.6 C)  TempSrc: Oral  SpO2: 99%   There is no height or weight on file to calculate BMI.  Physical Exam Constitutional:      Appearance: Normal appearance.  Pulmonary:     Effort: Pulmonary effort is normal. No respiratory distress.  Abdominal:     General: There is no distension.     Palpations: Abdomen is soft.  Musculoskeletal:     Comments: Wound vac on right foot.  Pictures from her phone reviewed and show an improved plantar wound.   Skin:    General: Skin is warm and dry.  Neurological:     General: No focal deficit present.     Mental Status: She is alert and oriented to person, place, and time.  Psychiatric:        Mood and Affect: Mood normal.        Behavior: Behavior normal.      Labs and Microbiology:    Latest Ref Rng & Units 04/18/2022    3:13 AM 04/05/2022    4:57 AM 04/04/2022    5:09 AM  CBC  WBC 3.8 - 10.8 Thousand/uL 8.6  17.3  21.6   Hemoglobin 11.7 - 15.5 g/dL 13.1  12.2  12.4   Hematocrit 35.0 - 45.0 % 40.3  38.8  38.6   Platelets 140 - 400 Thousand/uL 342  311  279       Latest Ref Rng & Units 04/18/2022    3:13 AM 04/05/2022    4:57 AM 04/04/2022    5:09 AM  CMP  Glucose 65 - 99 mg/dL 187  248  238   BUN 7 - 25 mg/dL '11  10  8   '$ Creatinine 0.50 - 1.05 mg/dL 0.50  0.51  0.51   Sodium 135 - 146 mmol/L 136  136  136   Potassium 3.5 - 5.3 mmol/L 4.2  3.8  4.3   Chloride 98 - 110 mmol/L 100  102  103   CO2 20 - 32 mmol/L '26  24  20   '$ Calcium 8.6 - 10.4 mg/dL 10.0  8.4  8.1       ASSESSMENT & PLAN:    Diabetic foot infection ([place]) She completed a 4 week course of antibiotics for extensive diabetic foot infection s/p I&D x 2 with podiatry.  Continues to improve as expected post-operatively.  Will continue with wound care and podiatry follow up, but follow up ID as needed now that she  has completed antibiotics a little over 2 weeks ago and still  doing well.  Happy to see her again as needed.        Raynelle [facility] for Infectious Disease [facility] 05/16/2022, 10:40 AM

## 2022-05-17 DIAGNOSIS — Z794 Long term (current) use of insulin: Secondary | ICD-10-CM | POA: Diagnosis not present

## 2022-05-17 DIAGNOSIS — Z9181 History of falling: Secondary | ICD-10-CM | POA: Diagnosis not present

## 2022-05-17 DIAGNOSIS — N200 Calculus of kidney: Secondary | ICD-10-CM | POA: Diagnosis not present

## 2022-05-17 DIAGNOSIS — E7800 Pure hypercholesterolemia, unspecified: Secondary | ICD-10-CM | POA: Diagnosis not present

## 2022-05-17 DIAGNOSIS — L02611 Cutaneous abscess of right foot: Secondary | ICD-10-CM | POA: Diagnosis not present

## 2022-05-17 DIAGNOSIS — S91331D Puncture wound without foreign body, right foot, subsequent encounter: Secondary | ICD-10-CM | POA: Diagnosis not present

## 2022-05-17 DIAGNOSIS — S91301A Unspecified open wound, right foot, initial encounter: Secondary | ICD-10-CM | POA: Diagnosis not present

## 2022-05-17 DIAGNOSIS — E1165 Type 2 diabetes mellitus with hyperglycemia: Secondary | ICD-10-CM | POA: Diagnosis not present

## 2022-05-17 DIAGNOSIS — I10 Essential (primary) hypertension: Secondary | ICD-10-CM | POA: Diagnosis not present

## 2022-05-17 DIAGNOSIS — B951 Streptococcus, group B, as the cause of diseases classified elsewhere: Secondary | ICD-10-CM | POA: Diagnosis not present

## 2022-05-17 DIAGNOSIS — N390 Urinary tract infection, site not specified: Secondary | ICD-10-CM | POA: Diagnosis not present

## 2022-05-17 DIAGNOSIS — Z791 Long term (current) use of non-steroidal anti-inflammatories (NSAID): Secondary | ICD-10-CM | POA: Diagnosis not present

## 2022-05-17 DIAGNOSIS — F32A Depression, unspecified: Secondary | ICD-10-CM | POA: Diagnosis not present

## 2022-05-17 DIAGNOSIS — F419 Anxiety disorder, unspecified: Secondary | ICD-10-CM | POA: Diagnosis not present

## 2022-05-17 DIAGNOSIS — Z87891 Personal history of nicotine dependence: Secondary | ICD-10-CM | POA: Diagnosis not present

## 2022-05-17 DIAGNOSIS — Z7985 Long-term (current) use of injectable non-insulin antidiabetic drugs: Secondary | ICD-10-CM | POA: Diagnosis not present

## 2022-05-17 DIAGNOSIS — T8189XA Other complications of procedures, not elsewhere classified, initial encounter: Secondary | ICD-10-CM | POA: Diagnosis not present

## 2022-05-17 DIAGNOSIS — L03115 Cellulitis of right lower limb: Secondary | ICD-10-CM | POA: Diagnosis not present

## 2022-05-18 DIAGNOSIS — T8189XA Other complications of procedures, not elsewhere classified, initial encounter: Secondary | ICD-10-CM | POA: Diagnosis not present

## 2022-05-18 DIAGNOSIS — S91301A Unspecified open wound, right foot, initial encounter: Secondary | ICD-10-CM | POA: Diagnosis not present

## 2022-05-19 DIAGNOSIS — S91301A Unspecified open wound, right foot, initial encounter: Secondary | ICD-10-CM | POA: Diagnosis not present

## 2022-05-19 DIAGNOSIS — T8189XA Other complications of procedures, not elsewhere classified, initial encounter: Secondary | ICD-10-CM | POA: Diagnosis not present

## 2022-05-20 DIAGNOSIS — B951 Streptococcus, group B, as the cause of diseases classified elsewhere: Secondary | ICD-10-CM | POA: Diagnosis not present

## 2022-05-20 DIAGNOSIS — Z9181 History of falling: Secondary | ICD-10-CM | POA: Diagnosis not present

## 2022-05-20 DIAGNOSIS — E1165 Type 2 diabetes mellitus with hyperglycemia: Secondary | ICD-10-CM | POA: Diagnosis not present

## 2022-05-20 DIAGNOSIS — E7800 Pure hypercholesterolemia, unspecified: Secondary | ICD-10-CM | POA: Diagnosis not present

## 2022-05-20 DIAGNOSIS — Z87891 Personal history of nicotine dependence: Secondary | ICD-10-CM | POA: Diagnosis not present

## 2022-05-20 DIAGNOSIS — S91331D Puncture wound without foreign body, right foot, subsequent encounter: Secondary | ICD-10-CM | POA: Diagnosis not present

## 2022-05-20 DIAGNOSIS — I10 Essential (primary) hypertension: Secondary | ICD-10-CM | POA: Diagnosis not present

## 2022-05-20 DIAGNOSIS — F32A Depression, unspecified: Secondary | ICD-10-CM | POA: Diagnosis not present

## 2022-05-20 DIAGNOSIS — F419 Anxiety disorder, unspecified: Secondary | ICD-10-CM | POA: Diagnosis not present

## 2022-05-20 DIAGNOSIS — S91301A Unspecified open wound, right foot, initial encounter: Secondary | ICD-10-CM | POA: Diagnosis not present

## 2022-05-20 DIAGNOSIS — N390 Urinary tract infection, site not specified: Secondary | ICD-10-CM | POA: Diagnosis not present

## 2022-05-20 DIAGNOSIS — L03115 Cellulitis of right lower limb: Secondary | ICD-10-CM | POA: Diagnosis not present

## 2022-05-20 DIAGNOSIS — Z791 Long term (current) use of non-steroidal anti-inflammatories (NSAID): Secondary | ICD-10-CM | POA: Diagnosis not present

## 2022-05-20 DIAGNOSIS — L02611 Cutaneous abscess of right foot: Secondary | ICD-10-CM | POA: Diagnosis not present

## 2022-05-20 DIAGNOSIS — Z794 Long term (current) use of insulin: Secondary | ICD-10-CM | POA: Diagnosis not present

## 2022-05-20 DIAGNOSIS — N200 Calculus of kidney: Secondary | ICD-10-CM | POA: Diagnosis not present

## 2022-05-20 DIAGNOSIS — Z7985 Long-term (current) use of injectable non-insulin antidiabetic drugs: Secondary | ICD-10-CM | POA: Diagnosis not present

## 2022-05-20 DIAGNOSIS — T8189XA Other complications of procedures, not elsewhere classified, initial encounter: Secondary | ICD-10-CM | POA: Diagnosis not present

## 2022-05-21 DIAGNOSIS — T8189XA Other complications of procedures, not elsewhere classified, initial encounter: Secondary | ICD-10-CM | POA: Diagnosis not present

## 2022-05-21 DIAGNOSIS — S91301A Unspecified open wound, right foot, initial encounter: Secondary | ICD-10-CM | POA: Diagnosis not present

## 2022-05-22 DIAGNOSIS — Z7985 Long-term (current) use of injectable non-insulin antidiabetic drugs: Secondary | ICD-10-CM | POA: Diagnosis not present

## 2022-05-22 DIAGNOSIS — F419 Anxiety disorder, unspecified: Secondary | ICD-10-CM | POA: Diagnosis not present

## 2022-05-22 DIAGNOSIS — E1165 Type 2 diabetes mellitus with hyperglycemia: Secondary | ICD-10-CM | POA: Diagnosis not present

## 2022-05-22 DIAGNOSIS — I10 Essential (primary) hypertension: Secondary | ICD-10-CM | POA: Diagnosis not present

## 2022-05-22 DIAGNOSIS — N390 Urinary tract infection, site not specified: Secondary | ICD-10-CM | POA: Diagnosis not present

## 2022-05-22 DIAGNOSIS — Z9181 History of falling: Secondary | ICD-10-CM | POA: Diagnosis not present

## 2022-05-22 DIAGNOSIS — E7800 Pure hypercholesterolemia, unspecified: Secondary | ICD-10-CM | POA: Diagnosis not present

## 2022-05-22 DIAGNOSIS — B951 Streptococcus, group B, as the cause of diseases classified elsewhere: Secondary | ICD-10-CM | POA: Diagnosis not present

## 2022-05-22 DIAGNOSIS — Z794 Long term (current) use of insulin: Secondary | ICD-10-CM | POA: Diagnosis not present

## 2022-05-22 DIAGNOSIS — Z87891 Personal history of nicotine dependence: Secondary | ICD-10-CM | POA: Diagnosis not present

## 2022-05-22 DIAGNOSIS — S91331D Puncture wound without foreign body, right foot, subsequent encounter: Secondary | ICD-10-CM | POA: Diagnosis not present

## 2022-05-22 DIAGNOSIS — S91301A Unspecified open wound, right foot, initial encounter: Secondary | ICD-10-CM | POA: Diagnosis not present

## 2022-05-22 DIAGNOSIS — T8189XA Other complications of procedures, not elsewhere classified, initial encounter: Secondary | ICD-10-CM | POA: Diagnosis not present

## 2022-05-22 DIAGNOSIS — N200 Calculus of kidney: Secondary | ICD-10-CM | POA: Diagnosis not present

## 2022-05-22 DIAGNOSIS — L02611 Cutaneous abscess of right foot: Secondary | ICD-10-CM | POA: Diagnosis not present

## 2022-05-22 DIAGNOSIS — L03115 Cellulitis of right lower limb: Secondary | ICD-10-CM | POA: Diagnosis not present

## 2022-05-22 DIAGNOSIS — F32A Depression, unspecified: Secondary | ICD-10-CM | POA: Diagnosis not present

## 2022-05-22 DIAGNOSIS — Z791 Long term (current) use of non-steroidal anti-inflammatories (NSAID): Secondary | ICD-10-CM | POA: Diagnosis not present

## 2022-05-23 DIAGNOSIS — T8189XA Other complications of procedures, not elsewhere classified, initial encounter: Secondary | ICD-10-CM | POA: Diagnosis not present

## 2022-05-23 DIAGNOSIS — S91301A Unspecified open wound, right foot, initial encounter: Secondary | ICD-10-CM | POA: Diagnosis not present

## 2022-05-24 DIAGNOSIS — N390 Urinary tract infection, site not specified: Secondary | ICD-10-CM | POA: Diagnosis not present

## 2022-05-24 DIAGNOSIS — N200 Calculus of kidney: Secondary | ICD-10-CM | POA: Diagnosis not present

## 2022-05-24 DIAGNOSIS — F32A Depression, unspecified: Secondary | ICD-10-CM | POA: Diagnosis not present

## 2022-05-24 DIAGNOSIS — Z9181 History of falling: Secondary | ICD-10-CM | POA: Diagnosis not present

## 2022-05-24 DIAGNOSIS — E7800 Pure hypercholesterolemia, unspecified: Secondary | ICD-10-CM | POA: Diagnosis not present

## 2022-05-24 DIAGNOSIS — L02611 Cutaneous abscess of right foot: Secondary | ICD-10-CM | POA: Diagnosis not present

## 2022-05-24 DIAGNOSIS — S91331D Puncture wound without foreign body, right foot, subsequent encounter: Secondary | ICD-10-CM | POA: Diagnosis not present

## 2022-05-24 DIAGNOSIS — Z791 Long term (current) use of non-steroidal anti-inflammatories (NSAID): Secondary | ICD-10-CM | POA: Diagnosis not present

## 2022-05-24 DIAGNOSIS — E1165 Type 2 diabetes mellitus with hyperglycemia: Secondary | ICD-10-CM | POA: Diagnosis not present

## 2022-05-24 DIAGNOSIS — B951 Streptococcus, group B, as the cause of diseases classified elsewhere: Secondary | ICD-10-CM | POA: Diagnosis not present

## 2022-05-24 DIAGNOSIS — F419 Anxiety disorder, unspecified: Secondary | ICD-10-CM | POA: Diagnosis not present

## 2022-05-24 DIAGNOSIS — T8189XA Other complications of procedures, not elsewhere classified, initial encounter: Secondary | ICD-10-CM | POA: Diagnosis not present

## 2022-05-24 DIAGNOSIS — L03115 Cellulitis of right lower limb: Secondary | ICD-10-CM | POA: Diagnosis not present

## 2022-05-24 DIAGNOSIS — S91301A Unspecified open wound, right foot, initial encounter: Secondary | ICD-10-CM | POA: Diagnosis not present

## 2022-05-24 DIAGNOSIS — Z794 Long term (current) use of insulin: Secondary | ICD-10-CM | POA: Diagnosis not present

## 2022-05-24 DIAGNOSIS — Z7985 Long-term (current) use of injectable non-insulin antidiabetic drugs: Secondary | ICD-10-CM | POA: Diagnosis not present

## 2022-05-24 DIAGNOSIS — Z87891 Personal history of nicotine dependence: Secondary | ICD-10-CM | POA: Diagnosis not present

## 2022-05-24 DIAGNOSIS — I10 Essential (primary) hypertension: Secondary | ICD-10-CM | POA: Diagnosis not present

## 2022-05-25 DIAGNOSIS — T8189XA Other complications of procedures, not elsewhere classified, initial encounter: Secondary | ICD-10-CM | POA: Diagnosis not present

## 2022-05-25 DIAGNOSIS — S91301A Unspecified open wound, right foot, initial encounter: Secondary | ICD-10-CM | POA: Diagnosis not present

## 2022-05-26 DIAGNOSIS — S91301A Unspecified open wound, right foot, initial encounter: Secondary | ICD-10-CM | POA: Diagnosis not present

## 2022-05-26 DIAGNOSIS — T8189XA Other complications of procedures, not elsewhere classified, initial encounter: Secondary | ICD-10-CM | POA: Diagnosis not present

## 2022-05-27 DIAGNOSIS — N390 Urinary tract infection, site not specified: Secondary | ICD-10-CM | POA: Diagnosis not present

## 2022-05-27 DIAGNOSIS — Z9181 History of falling: Secondary | ICD-10-CM | POA: Diagnosis not present

## 2022-05-27 DIAGNOSIS — S91301A Unspecified open wound, right foot, initial encounter: Secondary | ICD-10-CM | POA: Diagnosis not present

## 2022-05-27 DIAGNOSIS — S91331D Puncture wound without foreign body, right foot, subsequent encounter: Secondary | ICD-10-CM | POA: Diagnosis not present

## 2022-05-27 DIAGNOSIS — E7800 Pure hypercholesterolemia, unspecified: Secondary | ICD-10-CM | POA: Diagnosis not present

## 2022-05-27 DIAGNOSIS — Z87891 Personal history of nicotine dependence: Secondary | ICD-10-CM | POA: Diagnosis not present

## 2022-05-27 DIAGNOSIS — N200 Calculus of kidney: Secondary | ICD-10-CM | POA: Diagnosis not present

## 2022-05-27 DIAGNOSIS — E1165 Type 2 diabetes mellitus with hyperglycemia: Secondary | ICD-10-CM | POA: Diagnosis not present

## 2022-05-27 DIAGNOSIS — F419 Anxiety disorder, unspecified: Secondary | ICD-10-CM | POA: Diagnosis not present

## 2022-05-27 DIAGNOSIS — L02611 Cutaneous abscess of right foot: Secondary | ICD-10-CM | POA: Diagnosis not present

## 2022-05-27 DIAGNOSIS — Z794 Long term (current) use of insulin: Secondary | ICD-10-CM | POA: Diagnosis not present

## 2022-05-27 DIAGNOSIS — B951 Streptococcus, group B, as the cause of diseases classified elsewhere: Secondary | ICD-10-CM | POA: Diagnosis not present

## 2022-05-27 DIAGNOSIS — L03115 Cellulitis of right lower limb: Secondary | ICD-10-CM | POA: Diagnosis not present

## 2022-05-27 DIAGNOSIS — T8189XA Other complications of procedures, not elsewhere classified, initial encounter: Secondary | ICD-10-CM | POA: Diagnosis not present

## 2022-05-27 DIAGNOSIS — Z7985 Long-term (current) use of injectable non-insulin antidiabetic drugs: Secondary | ICD-10-CM | POA: Diagnosis not present

## 2022-05-27 DIAGNOSIS — F32A Depression, unspecified: Secondary | ICD-10-CM | POA: Diagnosis not present

## 2022-05-27 DIAGNOSIS — I10 Essential (primary) hypertension: Secondary | ICD-10-CM | POA: Diagnosis not present

## 2022-05-27 DIAGNOSIS — Z791 Long term (current) use of non-steroidal anti-inflammatories (NSAID): Secondary | ICD-10-CM | POA: Diagnosis not present

## 2022-05-28 DIAGNOSIS — T8189XA Other complications of procedures, not elsewhere classified, initial encounter: Secondary | ICD-10-CM | POA: Diagnosis not present

## 2022-05-28 DIAGNOSIS — S91301A Unspecified open wound, right foot, initial encounter: Secondary | ICD-10-CM | POA: Diagnosis not present

## 2022-05-29 DIAGNOSIS — E1165 Type 2 diabetes mellitus with hyperglycemia: Secondary | ICD-10-CM | POA: Diagnosis not present

## 2022-05-29 DIAGNOSIS — S91331D Puncture wound without foreign body, right foot, subsequent encounter: Secondary | ICD-10-CM | POA: Diagnosis not present

## 2022-05-29 DIAGNOSIS — Z7985 Long-term (current) use of injectable non-insulin antidiabetic drugs: Secondary | ICD-10-CM | POA: Diagnosis not present

## 2022-05-29 DIAGNOSIS — T8189XA Other complications of procedures, not elsewhere classified, initial encounter: Secondary | ICD-10-CM | POA: Diagnosis not present

## 2022-05-29 DIAGNOSIS — F32A Depression, unspecified: Secondary | ICD-10-CM | POA: Diagnosis not present

## 2022-05-29 DIAGNOSIS — Z87891 Personal history of nicotine dependence: Secondary | ICD-10-CM | POA: Diagnosis not present

## 2022-05-29 DIAGNOSIS — I10 Essential (primary) hypertension: Secondary | ICD-10-CM | POA: Diagnosis not present

## 2022-05-29 DIAGNOSIS — Z791 Long term (current) use of non-steroidal anti-inflammatories (NSAID): Secondary | ICD-10-CM | POA: Diagnosis not present

## 2022-05-29 DIAGNOSIS — N200 Calculus of kidney: Secondary | ICD-10-CM | POA: Diagnosis not present

## 2022-05-29 DIAGNOSIS — Z9181 History of falling: Secondary | ICD-10-CM | POA: Diagnosis not present

## 2022-05-29 DIAGNOSIS — F419 Anxiety disorder, unspecified: Secondary | ICD-10-CM | POA: Diagnosis not present

## 2022-05-29 DIAGNOSIS — Z794 Long term (current) use of insulin: Secondary | ICD-10-CM | POA: Diagnosis not present

## 2022-05-29 DIAGNOSIS — L02611 Cutaneous abscess of right foot: Secondary | ICD-10-CM | POA: Diagnosis not present

## 2022-05-29 DIAGNOSIS — S91301A Unspecified open wound, right foot, initial encounter: Secondary | ICD-10-CM | POA: Diagnosis not present

## 2022-05-29 DIAGNOSIS — L03115 Cellulitis of right lower limb: Secondary | ICD-10-CM | POA: Diagnosis not present

## 2022-05-29 DIAGNOSIS — N390 Urinary tract infection, site not specified: Secondary | ICD-10-CM | POA: Diagnosis not present

## 2022-05-29 DIAGNOSIS — B951 Streptococcus, group B, as the cause of diseases classified elsewhere: Secondary | ICD-10-CM | POA: Diagnosis not present

## 2022-05-29 DIAGNOSIS — E7800 Pure hypercholesterolemia, unspecified: Secondary | ICD-10-CM | POA: Diagnosis not present

## 2022-05-30 DIAGNOSIS — S91301A Unspecified open wound, right foot, initial encounter: Secondary | ICD-10-CM | POA: Diagnosis not present

## 2022-05-30 DIAGNOSIS — T8189XA Other complications of procedures, not elsewhere classified, initial encounter: Secondary | ICD-10-CM | POA: Diagnosis not present

## 2022-05-31 DIAGNOSIS — Z791 Long term (current) use of non-steroidal anti-inflammatories (NSAID): Secondary | ICD-10-CM | POA: Diagnosis not present

## 2022-05-31 DIAGNOSIS — Z7985 Long-term (current) use of injectable non-insulin antidiabetic drugs: Secondary | ICD-10-CM | POA: Diagnosis not present

## 2022-05-31 DIAGNOSIS — E1165 Type 2 diabetes mellitus with hyperglycemia: Secondary | ICD-10-CM | POA: Diagnosis not present

## 2022-05-31 DIAGNOSIS — Z794 Long term (current) use of insulin: Secondary | ICD-10-CM | POA: Diagnosis not present

## 2022-05-31 DIAGNOSIS — B951 Streptococcus, group B, as the cause of diseases classified elsewhere: Secondary | ICD-10-CM | POA: Diagnosis not present

## 2022-05-31 DIAGNOSIS — E7800 Pure hypercholesterolemia, unspecified: Secondary | ICD-10-CM | POA: Diagnosis not present

## 2022-05-31 DIAGNOSIS — Z9181 History of falling: Secondary | ICD-10-CM | POA: Diagnosis not present

## 2022-05-31 DIAGNOSIS — N390 Urinary tract infection, site not specified: Secondary | ICD-10-CM | POA: Diagnosis not present

## 2022-05-31 DIAGNOSIS — F32A Depression, unspecified: Secondary | ICD-10-CM | POA: Diagnosis not present

## 2022-05-31 DIAGNOSIS — N200 Calculus of kidney: Secondary | ICD-10-CM | POA: Diagnosis not present

## 2022-05-31 DIAGNOSIS — I10 Essential (primary) hypertension: Secondary | ICD-10-CM | POA: Diagnosis not present

## 2022-05-31 DIAGNOSIS — F419 Anxiety disorder, unspecified: Secondary | ICD-10-CM | POA: Diagnosis not present

## 2022-05-31 DIAGNOSIS — L02611 Cutaneous abscess of right foot: Secondary | ICD-10-CM | POA: Diagnosis not present

## 2022-05-31 DIAGNOSIS — S91301A Unspecified open wound, right foot, initial encounter: Secondary | ICD-10-CM | POA: Diagnosis not present

## 2022-05-31 DIAGNOSIS — L03115 Cellulitis of right lower limb: Secondary | ICD-10-CM | POA: Diagnosis not present

## 2022-05-31 DIAGNOSIS — T8189XA Other complications of procedures, not elsewhere classified, initial encounter: Secondary | ICD-10-CM | POA: Diagnosis not present

## 2022-05-31 DIAGNOSIS — S91331D Puncture wound without foreign body, right foot, subsequent encounter: Secondary | ICD-10-CM | POA: Diagnosis not present

## 2022-05-31 DIAGNOSIS — Z87891 Personal history of nicotine dependence: Secondary | ICD-10-CM | POA: Diagnosis not present

## 2022-06-01 DIAGNOSIS — S91301A Unspecified open wound, right foot, initial encounter: Secondary | ICD-10-CM | POA: Diagnosis not present

## 2022-06-01 DIAGNOSIS — T8189XA Other complications of procedures, not elsewhere classified, initial encounter: Secondary | ICD-10-CM | POA: Diagnosis not present

## 2022-06-02 DIAGNOSIS — T8189XA Other complications of procedures, not elsewhere classified, initial encounter: Secondary | ICD-10-CM | POA: Diagnosis not present

## 2022-06-02 DIAGNOSIS — S91301A Unspecified open wound, right foot, initial encounter: Secondary | ICD-10-CM | POA: Diagnosis not present

## 2022-06-03 DIAGNOSIS — F419 Anxiety disorder, unspecified: Secondary | ICD-10-CM | POA: Diagnosis not present

## 2022-06-03 DIAGNOSIS — Z791 Long term (current) use of non-steroidal anti-inflammatories (NSAID): Secondary | ICD-10-CM | POA: Diagnosis not present

## 2022-06-03 DIAGNOSIS — Z7985 Long-term (current) use of injectable non-insulin antidiabetic drugs: Secondary | ICD-10-CM | POA: Diagnosis not present

## 2022-06-03 DIAGNOSIS — Z87891 Personal history of nicotine dependence: Secondary | ICD-10-CM | POA: Diagnosis not present

## 2022-06-03 DIAGNOSIS — S91331D Puncture wound without foreign body, right foot, subsequent encounter: Secondary | ICD-10-CM | POA: Diagnosis not present

## 2022-06-03 DIAGNOSIS — F32A Depression, unspecified: Secondary | ICD-10-CM | POA: Diagnosis not present

## 2022-06-03 DIAGNOSIS — L03115 Cellulitis of right lower limb: Secondary | ICD-10-CM | POA: Diagnosis not present

## 2022-06-03 DIAGNOSIS — E1165 Type 2 diabetes mellitus with hyperglycemia: Secondary | ICD-10-CM | POA: Diagnosis not present

## 2022-06-03 DIAGNOSIS — Z9181 History of falling: Secondary | ICD-10-CM | POA: Diagnosis not present

## 2022-06-03 DIAGNOSIS — E7800 Pure hypercholesterolemia, unspecified: Secondary | ICD-10-CM | POA: Diagnosis not present

## 2022-06-03 DIAGNOSIS — I10 Essential (primary) hypertension: Secondary | ICD-10-CM | POA: Diagnosis not present

## 2022-06-03 DIAGNOSIS — L02611 Cutaneous abscess of right foot: Secondary | ICD-10-CM | POA: Diagnosis not present

## 2022-06-03 DIAGNOSIS — B951 Streptococcus, group B, as the cause of diseases classified elsewhere: Secondary | ICD-10-CM | POA: Diagnosis not present

## 2022-06-03 DIAGNOSIS — N200 Calculus of kidney: Secondary | ICD-10-CM | POA: Diagnosis not present

## 2022-06-03 DIAGNOSIS — T8189XA Other complications of procedures, not elsewhere classified, initial encounter: Secondary | ICD-10-CM | POA: Diagnosis not present

## 2022-06-03 DIAGNOSIS — Z794 Long term (current) use of insulin: Secondary | ICD-10-CM | POA: Diagnosis not present

## 2022-06-03 DIAGNOSIS — S91301A Unspecified open wound, right foot, initial encounter: Secondary | ICD-10-CM | POA: Diagnosis not present

## 2022-06-03 DIAGNOSIS — N390 Urinary tract infection, site not specified: Secondary | ICD-10-CM | POA: Diagnosis not present

## 2022-06-04 ENCOUNTER — Telehealth: Payer: Self-pay

## 2022-06-04 DIAGNOSIS — T8189XA Other complications of procedures, not elsewhere classified, initial encounter: Secondary | ICD-10-CM | POA: Diagnosis not present

## 2022-06-04 DIAGNOSIS — S91301A Unspecified open wound, right foot, initial encounter: Secondary | ICD-10-CM | POA: Diagnosis not present

## 2022-06-05 DIAGNOSIS — Z7985 Long-term (current) use of injectable non-insulin antidiabetic drugs: Secondary | ICD-10-CM | POA: Diagnosis not present

## 2022-06-05 DIAGNOSIS — F419 Anxiety disorder, unspecified: Secondary | ICD-10-CM | POA: Diagnosis not present

## 2022-06-05 DIAGNOSIS — S91331D Puncture wound without foreign body, right foot, subsequent encounter: Secondary | ICD-10-CM | POA: Diagnosis not present

## 2022-06-05 DIAGNOSIS — F32A Depression, unspecified: Secondary | ICD-10-CM | POA: Diagnosis not present

## 2022-06-05 DIAGNOSIS — N200 Calculus of kidney: Secondary | ICD-10-CM | POA: Diagnosis not present

## 2022-06-05 DIAGNOSIS — N390 Urinary tract infection, site not specified: Secondary | ICD-10-CM | POA: Diagnosis not present

## 2022-06-05 DIAGNOSIS — S91301A Unspecified open wound, right foot, initial encounter: Secondary | ICD-10-CM | POA: Diagnosis not present

## 2022-06-05 DIAGNOSIS — Z87891 Personal history of nicotine dependence: Secondary | ICD-10-CM | POA: Diagnosis not present

## 2022-06-05 DIAGNOSIS — E7800 Pure hypercholesterolemia, unspecified: Secondary | ICD-10-CM | POA: Diagnosis not present

## 2022-06-05 DIAGNOSIS — I10 Essential (primary) hypertension: Secondary | ICD-10-CM | POA: Diagnosis not present

## 2022-06-05 DIAGNOSIS — B951 Streptococcus, group B, as the cause of diseases classified elsewhere: Secondary | ICD-10-CM | POA: Diagnosis not present

## 2022-06-05 DIAGNOSIS — L03115 Cellulitis of right lower limb: Secondary | ICD-10-CM | POA: Diagnosis not present

## 2022-06-05 DIAGNOSIS — Z794 Long term (current) use of insulin: Secondary | ICD-10-CM | POA: Diagnosis not present

## 2022-06-05 DIAGNOSIS — Z9181 History of falling: Secondary | ICD-10-CM | POA: Diagnosis not present

## 2022-06-05 DIAGNOSIS — E1165 Type 2 diabetes mellitus with hyperglycemia: Secondary | ICD-10-CM | POA: Diagnosis not present

## 2022-06-05 DIAGNOSIS — Z791 Long term (current) use of non-steroidal anti-inflammatories (NSAID): Secondary | ICD-10-CM | POA: Diagnosis not present

## 2022-06-05 DIAGNOSIS — L02611 Cutaneous abscess of right foot: Secondary | ICD-10-CM | POA: Diagnosis not present

## 2022-06-05 DIAGNOSIS — T8189XA Other complications of procedures, not elsewhere classified, initial encounter: Secondary | ICD-10-CM | POA: Diagnosis not present

## 2022-06-06 DIAGNOSIS — S91301A Unspecified open wound, right foot, initial encounter: Secondary | ICD-10-CM | POA: Diagnosis not present

## 2022-06-06 DIAGNOSIS — T8189XA Other complications of procedures, not elsewhere classified, initial encounter: Secondary | ICD-10-CM | POA: Diagnosis not present

## 2022-06-06 NOTE — Telephone Encounter (Signed)
Messge was routed to [facility]

## 2022-06-07 DIAGNOSIS — I10 Essential (primary) hypertension: Secondary | ICD-10-CM | POA: Diagnosis not present

## 2022-06-07 DIAGNOSIS — E1165 Type 2 diabetes mellitus with hyperglycemia: Secondary | ICD-10-CM | POA: Diagnosis not present

## 2022-06-07 DIAGNOSIS — Z791 Long term (current) use of non-steroidal anti-inflammatories (NSAID): Secondary | ICD-10-CM | POA: Diagnosis not present

## 2022-06-07 DIAGNOSIS — Z87891 Personal history of nicotine dependence: Secondary | ICD-10-CM | POA: Diagnosis not present

## 2022-06-07 DIAGNOSIS — Z794 Long term (current) use of insulin: Secondary | ICD-10-CM | POA: Diagnosis not present

## 2022-06-07 DIAGNOSIS — F32A Depression, unspecified: Secondary | ICD-10-CM | POA: Diagnosis not present

## 2022-06-07 DIAGNOSIS — L02611 Cutaneous abscess of right foot: Secondary | ICD-10-CM | POA: Diagnosis not present

## 2022-06-07 DIAGNOSIS — E7800 Pure hypercholesterolemia, unspecified: Secondary | ICD-10-CM | POA: Diagnosis not present

## 2022-06-07 DIAGNOSIS — T8189XA Other complications of procedures, not elsewhere classified, initial encounter: Secondary | ICD-10-CM | POA: Diagnosis not present

## 2022-06-07 DIAGNOSIS — Z9181 History of falling: Secondary | ICD-10-CM | POA: Diagnosis not present

## 2022-06-07 DIAGNOSIS — Z7985 Long-term (current) use of injectable non-insulin antidiabetic drugs: Secondary | ICD-10-CM | POA: Diagnosis not present

## 2022-06-07 DIAGNOSIS — S91301A Unspecified open wound, right foot, initial encounter: Secondary | ICD-10-CM | POA: Diagnosis not present

## 2022-06-07 DIAGNOSIS — S91331D Puncture wound without foreign body, right foot, subsequent encounter: Secondary | ICD-10-CM | POA: Diagnosis not present

## 2022-06-07 DIAGNOSIS — Z87440 Personal history of urinary (tract) infections: Secondary | ICD-10-CM | POA: Diagnosis not present

## 2022-06-07 DIAGNOSIS — B951 Streptococcus, group B, as the cause of diseases classified elsewhere: Secondary | ICD-10-CM | POA: Diagnosis not present

## 2022-06-07 DIAGNOSIS — N200 Calculus of kidney: Secondary | ICD-10-CM | POA: Diagnosis not present

## 2022-06-07 DIAGNOSIS — L03115 Cellulitis of right lower limb: Secondary | ICD-10-CM | POA: Diagnosis not present

## 2022-06-07 DIAGNOSIS — F419 Anxiety disorder, unspecified: Secondary | ICD-10-CM | POA: Diagnosis not present

## 2022-06-08 DIAGNOSIS — T8189XA Other complications of procedures, not elsewhere classified, initial encounter: Secondary | ICD-10-CM | POA: Diagnosis not present

## 2022-06-08 DIAGNOSIS — S91301A Unspecified open wound, right foot, initial encounter: Secondary | ICD-10-CM | POA: Diagnosis not present

## 2022-06-09 DIAGNOSIS — T8189XA Other complications of procedures, not elsewhere classified, initial encounter: Secondary | ICD-10-CM | POA: Diagnosis not present

## 2022-06-09 DIAGNOSIS — S91301A Unspecified open wound, right foot, initial encounter: Secondary | ICD-10-CM | POA: Diagnosis not present

## 2022-06-10 DIAGNOSIS — Z7985 Long-term (current) use of injectable non-insulin antidiabetic drugs: Secondary | ICD-10-CM | POA: Diagnosis not present

## 2022-06-10 DIAGNOSIS — N200 Calculus of kidney: Secondary | ICD-10-CM | POA: Diagnosis not present

## 2022-06-10 DIAGNOSIS — L03115 Cellulitis of right lower limb: Secondary | ICD-10-CM | POA: Diagnosis not present

## 2022-06-10 DIAGNOSIS — Z794 Long term (current) use of insulin: Secondary | ICD-10-CM | POA: Diagnosis not present

## 2022-06-10 DIAGNOSIS — E7800 Pure hypercholesterolemia, unspecified: Secondary | ICD-10-CM | POA: Diagnosis not present

## 2022-06-10 DIAGNOSIS — T8189XA Other complications of procedures, not elsewhere classified, initial encounter: Secondary | ICD-10-CM | POA: Diagnosis not present

## 2022-06-10 DIAGNOSIS — Z87891 Personal history of nicotine dependence: Secondary | ICD-10-CM | POA: Diagnosis not present

## 2022-06-10 DIAGNOSIS — Z87440 Personal history of urinary (tract) infections: Secondary | ICD-10-CM | POA: Diagnosis not present

## 2022-06-10 DIAGNOSIS — Z791 Long term (current) use of non-steroidal anti-inflammatories (NSAID): Secondary | ICD-10-CM | POA: Diagnosis not present

## 2022-06-10 DIAGNOSIS — B951 Streptococcus, group B, as the cause of diseases classified elsewhere: Secondary | ICD-10-CM | POA: Diagnosis not present

## 2022-06-10 DIAGNOSIS — I10 Essential (primary) hypertension: Secondary | ICD-10-CM | POA: Diagnosis not present

## 2022-06-10 DIAGNOSIS — F419 Anxiety disorder, unspecified: Secondary | ICD-10-CM | POA: Diagnosis not present

## 2022-06-10 DIAGNOSIS — L02611 Cutaneous abscess of right foot: Secondary | ICD-10-CM | POA: Diagnosis not present

## 2022-06-10 DIAGNOSIS — E1165 Type 2 diabetes mellitus with hyperglycemia: Secondary | ICD-10-CM | POA: Diagnosis not present

## 2022-06-10 DIAGNOSIS — S91331D Puncture wound without foreign body, right foot, subsequent encounter: Secondary | ICD-10-CM | POA: Diagnosis not present

## 2022-06-10 DIAGNOSIS — Z9181 History of falling: Secondary | ICD-10-CM | POA: Diagnosis not present

## 2022-06-10 DIAGNOSIS — S91301A Unspecified open wound, right foot, initial encounter: Secondary | ICD-10-CM | POA: Diagnosis not present

## 2022-06-10 DIAGNOSIS — F32A Depression, unspecified: Secondary | ICD-10-CM | POA: Diagnosis not present

## 2022-06-10 NOTE — Telephone Encounter (Signed)
Called no answer, could not leave voice message

## 2022-06-11 DIAGNOSIS — T8189XA Other complications of procedures, not elsewhere classified, initial encounter: Secondary | ICD-10-CM | POA: Diagnosis not present

## 2022-06-11 DIAGNOSIS — S91301A Unspecified open wound, right foot, initial encounter: Secondary | ICD-10-CM | POA: Diagnosis not present

## 2022-06-12 DIAGNOSIS — E7800 Pure hypercholesterolemia, unspecified: Secondary | ICD-10-CM | POA: Diagnosis not present

## 2022-06-12 DIAGNOSIS — L02611 Cutaneous abscess of right foot: Secondary | ICD-10-CM | POA: Diagnosis not present

## 2022-06-12 DIAGNOSIS — E1165 Type 2 diabetes mellitus with hyperglycemia: Secondary | ICD-10-CM | POA: Diagnosis not present

## 2022-06-12 DIAGNOSIS — Z791 Long term (current) use of non-steroidal anti-inflammatories (NSAID): Secondary | ICD-10-CM | POA: Diagnosis not present

## 2022-06-12 DIAGNOSIS — T8189XA Other complications of procedures, not elsewhere classified, initial encounter: Secondary | ICD-10-CM | POA: Diagnosis not present

## 2022-06-12 DIAGNOSIS — N200 Calculus of kidney: Secondary | ICD-10-CM | POA: Diagnosis not present

## 2022-06-12 DIAGNOSIS — Z9181 History of falling: Secondary | ICD-10-CM | POA: Diagnosis not present

## 2022-06-12 DIAGNOSIS — B951 Streptococcus, group B, as the cause of diseases classified elsewhere: Secondary | ICD-10-CM | POA: Diagnosis not present

## 2022-06-12 DIAGNOSIS — S91331D Puncture wound without foreign body, right foot, subsequent encounter: Secondary | ICD-10-CM | POA: Diagnosis not present

## 2022-06-12 DIAGNOSIS — Z87440 Personal history of urinary (tract) infections: Secondary | ICD-10-CM | POA: Diagnosis not present

## 2022-06-12 DIAGNOSIS — I10 Essential (primary) hypertension: Secondary | ICD-10-CM | POA: Diagnosis not present

## 2022-06-12 DIAGNOSIS — L03115 Cellulitis of right lower limb: Secondary | ICD-10-CM | POA: Diagnosis not present

## 2022-06-12 DIAGNOSIS — S91301A Unspecified open wound, right foot, initial encounter: Secondary | ICD-10-CM | POA: Diagnosis not present

## 2022-06-12 DIAGNOSIS — Z87891 Personal history of nicotine dependence: Secondary | ICD-10-CM | POA: Diagnosis not present

## 2022-06-12 DIAGNOSIS — F419 Anxiety disorder, unspecified: Secondary | ICD-10-CM | POA: Diagnosis not present

## 2022-06-12 DIAGNOSIS — F32A Depression, unspecified: Secondary | ICD-10-CM | POA: Diagnosis not present

## 2022-06-12 DIAGNOSIS — Z794 Long term (current) use of insulin: Secondary | ICD-10-CM | POA: Diagnosis not present

## 2022-06-12 DIAGNOSIS — Z7985 Long-term (current) use of injectable non-insulin antidiabetic drugs: Secondary | ICD-10-CM | POA: Diagnosis not present

## 2022-06-13 ENCOUNTER — Telehealth: Payer: Self-pay | Admitting: Dietician

## 2022-06-13 DIAGNOSIS — T8189XA Other complications of procedures, not elsewhere classified, initial encounter: Secondary | ICD-10-CM | POA: Diagnosis not present

## 2022-06-13 DIAGNOSIS — S91301A Unspecified open wound, right foot, initial encounter: Secondary | ICD-10-CM | POA: Diagnosis not present

## 2022-06-13 NOTE — Telephone Encounter (Signed)
Patient called and would like to make a nutrition appointment.   History of diabetes and foot wound. She will call Dr. Buddy Duty for a referral. Appointment has been made.  Antonieta Iba, RD, LDN, CDCES

## 2022-06-14 ENCOUNTER — Ambulatory Visit (INDEPENDENT_AMBULATORY_CARE_PROVIDER_SITE_OTHER): Payer: BC Managed Care – PPO | Admitting: Podiatry

## 2022-06-14 DIAGNOSIS — I10 Essential (primary) hypertension: Secondary | ICD-10-CM | POA: Diagnosis not present

## 2022-06-14 DIAGNOSIS — Z791 Long term (current) use of non-steroidal anti-inflammatories (NSAID): Secondary | ICD-10-CM | POA: Diagnosis not present

## 2022-06-14 DIAGNOSIS — S91331D Puncture wound without foreign body, right foot, subsequent encounter: Secondary | ICD-10-CM | POA: Diagnosis not present

## 2022-06-14 DIAGNOSIS — Z87891 Personal history of nicotine dependence: Secondary | ICD-10-CM | POA: Diagnosis not present

## 2022-06-14 DIAGNOSIS — L97512 Non-pressure chronic ulcer of other part of right foot with fat layer exposed: Secondary | ICD-10-CM

## 2022-06-14 DIAGNOSIS — F419 Anxiety disorder, unspecified: Secondary | ICD-10-CM | POA: Diagnosis not present

## 2022-06-14 DIAGNOSIS — Z794 Long term (current) use of insulin: Secondary | ICD-10-CM | POA: Diagnosis not present

## 2022-06-14 DIAGNOSIS — E7800 Pure hypercholesterolemia, unspecified: Secondary | ICD-10-CM | POA: Diagnosis not present

## 2022-06-14 DIAGNOSIS — L03115 Cellulitis of right lower limb: Secondary | ICD-10-CM | POA: Diagnosis not present

## 2022-06-14 DIAGNOSIS — Z9181 History of falling: Secondary | ICD-10-CM | POA: Diagnosis not present

## 2022-06-14 DIAGNOSIS — T8189XA Other complications of procedures, not elsewhere classified, initial encounter: Secondary | ICD-10-CM | POA: Diagnosis not present

## 2022-06-14 DIAGNOSIS — Z7985 Long-term (current) use of injectable non-insulin antidiabetic drugs: Secondary | ICD-10-CM | POA: Diagnosis not present

## 2022-06-14 DIAGNOSIS — F32A Depression, unspecified: Secondary | ICD-10-CM | POA: Diagnosis not present

## 2022-06-14 DIAGNOSIS — N200 Calculus of kidney: Secondary | ICD-10-CM | POA: Diagnosis not present

## 2022-06-14 DIAGNOSIS — E1165 Type 2 diabetes mellitus with hyperglycemia: Secondary | ICD-10-CM | POA: Diagnosis not present

## 2022-06-14 DIAGNOSIS — S91301A Unspecified open wound, right foot, initial encounter: Secondary | ICD-10-CM | POA: Diagnosis not present

## 2022-06-14 DIAGNOSIS — Z87440 Personal history of urinary (tract) infections: Secondary | ICD-10-CM | POA: Diagnosis not present

## 2022-06-14 DIAGNOSIS — E119 Type 2 diabetes mellitus without complications: Secondary | ICD-10-CM

## 2022-06-14 DIAGNOSIS — B951 Streptococcus, group B, as the cause of diseases classified elsewhere: Secondary | ICD-10-CM | POA: Diagnosis not present

## 2022-06-14 DIAGNOSIS — L02611 Cutaneous abscess of right foot: Secondary | ICD-10-CM | POA: Diagnosis not present

## 2022-06-14 NOTE — Progress Notes (Signed)
Subjective:  Patient ID: Courtney Parks, female    DOB: 10/03/1960,  MRN: [id number]  Chief Complaint  Patient presents with   Wound Check    4 week wound check     DOS: 04/03/2022 Procedure: Incision and drainage debridement and washout of the right foot  61 y.o. female returns for post-op check.  Patient states she is doing well.  The wound VAC has helped considerably.  She denies any other acute complaints.  Bandages clean dry and intact.  It is becoming more granular in nature.  Review of Systems: Negative except as noted in the HPI. Denies N/V/F/Ch.  Past Medical History:  Diagnosis Date   Anxiety    Depression    Diabetes mellitus, type II ([place])    Hyperlipidemia    Hypertension    Kidney stones     Current Outpatient Medications:    acetaminophen (TYLENOL) 500 MG tablet, Take 500 mg by mouth every 6 (six) hours as needed for moderate pain., Disp: , Rfl:    aspirin EC 81 MG tablet, Take 81 mg by mouth daily. Swallow whole., Disp: , Rfl:    Continuous Blood Gluc Sensor (FREESTYLE LIBRE 2 SENSOR) MISC, 1 each by Does not apply route PRO., Disp: 1 each, Rfl: 4   Dulaglutide (TRULICITY) 1.5 IO/9.7DZ SOPN, Inject 1.5 mg into the skin once a week., Disp: , Rfl:    ibuprofen (ADVIL) 200 MG tablet, Take 200-800 mg by mouth every 6 (six) hours as needed for moderate pain., Disp: , Rfl:    insulin glargine, 1 Unit Dial, (TOUJEO SOLOSTAR) 300 UNIT/ML Solostar Pen, Inject 42 Units into the skin daily in the afternoon., Disp: 1.5 mL, Rfl: 1   Insulin Pen Needle (PEN NEEDLES 3/16") 31G X 5 MM MISC, 100 each by Does not apply route 4 (four) times daily., Disp: 100 each, Rfl: 3   Multiple Vitamin (MULTIVITAMIN) capsule, Take 1 capsule by mouth every other day., Disp: , Rfl:    Probiotic Product (PROBIOTIC BLEND PO), Take by mouth., Disp: , Rfl:    senna-docusate (SENOKOT-S) 8.6-50 MG tablet, Take 1 tablet by mouth at bedtime as needed for moderate constipation., Disp: 60 tablet,  Rfl: 1  Social History   Tobacco Use  Smoking Status Former   Years: 5.00   Types: Cigarettes   Quit date: 12/12/1983   Years since quitting: 38.5  Smokeless Tobacco Never    Allergies  Allergen Reactions   Codeine     REACTION: Nausea   Demerol Nausea And Vomiting   Other Other (See Comments)    Narcotics- drops her blood pressure   Shellfish Allergy Swelling   Objective:  There were no vitals filed for this visit. There is no height or weight on file to calculate BMI. Constitutional Well developed. Well nourished.  Vascular Foot warm and well perfused. Capillary refill normal to all digits.   Neurologic Normal speech. Oriented to person, place, and time. Epicritic sensation to light touch grossly present bilaterally.  Dermatologic Wound size as below.  Granular wound bed noted some area of fibrotic tissue noted.  Orthopedic: No further tenderness to palpation noted about the surgical site.   Radiographs: None Assessment:   1. Ulcerated, foot, right, with fat layer exposed ([place])   2. Type 2 diabetes mellitus without complication, with long-term current use of insulin ([place])     Plan:  Patient was evaluated and treated and all questions answered.  S/p foot surgery right -Progressing as expected post-operatively. -XR: See above -  WB Status: Nonweightbearing in right lower extremity -Sutures: Intact.  No clinical signs of [place]is is noted.  No complication noted. -Medications: None -Foot redressed. -The right foot wound measurements 2.5 cm x 2.5 cm 0.6 cm -continue wound VAC changes  No follow-ups on file.

## 2022-06-15 DIAGNOSIS — S91301A Unspecified open wound, right foot, initial encounter: Secondary | ICD-10-CM | POA: Diagnosis not present

## 2022-06-15 DIAGNOSIS — T8189XA Other complications of procedures, not elsewhere classified, initial encounter: Secondary | ICD-10-CM | POA: Diagnosis not present

## 2022-06-16 DIAGNOSIS — T8189XA Other complications of procedures, not elsewhere classified, initial encounter: Secondary | ICD-10-CM | POA: Diagnosis not present

## 2022-06-16 DIAGNOSIS — S91301A Unspecified open wound, right foot, initial encounter: Secondary | ICD-10-CM | POA: Diagnosis not present

## 2022-06-17 DIAGNOSIS — I10 Essential (primary) hypertension: Secondary | ICD-10-CM | POA: Diagnosis not present

## 2022-06-17 DIAGNOSIS — T8189XA Other complications of procedures, not elsewhere classified, initial encounter: Secondary | ICD-10-CM | POA: Diagnosis not present

## 2022-06-17 DIAGNOSIS — Z87440 Personal history of urinary (tract) infections: Secondary | ICD-10-CM | POA: Diagnosis not present

## 2022-06-17 DIAGNOSIS — E1165 Type 2 diabetes mellitus with hyperglycemia: Secondary | ICD-10-CM | POA: Diagnosis not present

## 2022-06-17 DIAGNOSIS — F419 Anxiety disorder, unspecified: Secondary | ICD-10-CM | POA: Diagnosis not present

## 2022-06-17 DIAGNOSIS — Z7985 Long-term (current) use of injectable non-insulin antidiabetic drugs: Secondary | ICD-10-CM | POA: Diagnosis not present

## 2022-06-17 DIAGNOSIS — Z87891 Personal history of nicotine dependence: Secondary | ICD-10-CM | POA: Diagnosis not present

## 2022-06-17 DIAGNOSIS — Z791 Long term (current) use of non-steroidal anti-inflammatories (NSAID): Secondary | ICD-10-CM | POA: Diagnosis not present

## 2022-06-17 DIAGNOSIS — Z9181 History of falling: Secondary | ICD-10-CM | POA: Diagnosis not present

## 2022-06-17 DIAGNOSIS — E7800 Pure hypercholesterolemia, unspecified: Secondary | ICD-10-CM | POA: Diagnosis not present

## 2022-06-17 DIAGNOSIS — N200 Calculus of kidney: Secondary | ICD-10-CM | POA: Diagnosis not present

## 2022-06-17 DIAGNOSIS — B951 Streptococcus, group B, as the cause of diseases classified elsewhere: Secondary | ICD-10-CM | POA: Diagnosis not present

## 2022-06-17 DIAGNOSIS — F32A Depression, unspecified: Secondary | ICD-10-CM | POA: Diagnosis not present

## 2022-06-17 DIAGNOSIS — S91331D Puncture wound without foreign body, right foot, subsequent encounter: Secondary | ICD-10-CM | POA: Diagnosis not present

## 2022-06-17 DIAGNOSIS — S91301A Unspecified open wound, right foot, initial encounter: Secondary | ICD-10-CM | POA: Diagnosis not present

## 2022-06-17 DIAGNOSIS — L03115 Cellulitis of right lower limb: Secondary | ICD-10-CM | POA: Diagnosis not present

## 2022-06-17 DIAGNOSIS — L02611 Cutaneous abscess of right foot: Secondary | ICD-10-CM | POA: Diagnosis not present

## 2022-06-17 DIAGNOSIS — Z794 Long term (current) use of insulin: Secondary | ICD-10-CM | POA: Diagnosis not present

## 2022-06-18 DIAGNOSIS — T8189XA Other complications of procedures, not elsewhere classified, initial encounter: Secondary | ICD-10-CM | POA: Diagnosis not present

## 2022-06-18 DIAGNOSIS — S91301A Unspecified open wound, right foot, initial encounter: Secondary | ICD-10-CM | POA: Diagnosis not present

## 2022-06-19 DIAGNOSIS — Z791 Long term (current) use of non-steroidal anti-inflammatories (NSAID): Secondary | ICD-10-CM | POA: Diagnosis not present

## 2022-06-19 DIAGNOSIS — S91331D Puncture wound without foreign body, right foot, subsequent encounter: Secondary | ICD-10-CM | POA: Diagnosis not present

## 2022-06-19 DIAGNOSIS — I10 Essential (primary) hypertension: Secondary | ICD-10-CM | POA: Diagnosis not present

## 2022-06-19 DIAGNOSIS — Z794 Long term (current) use of insulin: Secondary | ICD-10-CM | POA: Diagnosis not present

## 2022-06-19 DIAGNOSIS — Z87891 Personal history of nicotine dependence: Secondary | ICD-10-CM | POA: Diagnosis not present

## 2022-06-19 DIAGNOSIS — E1165 Type 2 diabetes mellitus with hyperglycemia: Secondary | ICD-10-CM | POA: Diagnosis not present

## 2022-06-19 DIAGNOSIS — T8189XA Other complications of procedures, not elsewhere classified, initial encounter: Secondary | ICD-10-CM | POA: Diagnosis not present

## 2022-06-19 DIAGNOSIS — E7800 Pure hypercholesterolemia, unspecified: Secondary | ICD-10-CM | POA: Diagnosis not present

## 2022-06-19 DIAGNOSIS — N200 Calculus of kidney: Secondary | ICD-10-CM | POA: Diagnosis not present

## 2022-06-19 DIAGNOSIS — Z9181 History of falling: Secondary | ICD-10-CM | POA: Diagnosis not present

## 2022-06-19 DIAGNOSIS — S91301A Unspecified open wound, right foot, initial encounter: Secondary | ICD-10-CM | POA: Diagnosis not present

## 2022-06-19 DIAGNOSIS — B951 Streptococcus, group B, as the cause of diseases classified elsewhere: Secondary | ICD-10-CM | POA: Diagnosis not present

## 2022-06-19 DIAGNOSIS — L02611 Cutaneous abscess of right foot: Secondary | ICD-10-CM | POA: Diagnosis not present

## 2022-06-19 DIAGNOSIS — Z7985 Long-term (current) use of injectable non-insulin antidiabetic drugs: Secondary | ICD-10-CM | POA: Diagnosis not present

## 2022-06-19 DIAGNOSIS — F32A Depression, unspecified: Secondary | ICD-10-CM | POA: Diagnosis not present

## 2022-06-19 DIAGNOSIS — Z87440 Personal history of urinary (tract) infections: Secondary | ICD-10-CM | POA: Diagnosis not present

## 2022-06-19 DIAGNOSIS — F419 Anxiety disorder, unspecified: Secondary | ICD-10-CM | POA: Diagnosis not present

## 2022-06-19 DIAGNOSIS — L03115 Cellulitis of right lower limb: Secondary | ICD-10-CM | POA: Diagnosis not present

## 2022-06-20 ENCOUNTER — Encounter: Payer: Self-pay | Admitting: Dietician

## 2022-06-20 ENCOUNTER — Encounter: Payer: BC Managed Care – PPO | Attending: Internal Medicine | Admitting: Dietician

## 2022-06-20 DIAGNOSIS — E119 Type 2 diabetes mellitus without complications: Secondary | ICD-10-CM | POA: Insufficient documentation

## 2022-06-20 DIAGNOSIS — T8189XA Other complications of procedures, not elsewhere classified, initial encounter: Secondary | ICD-10-CM | POA: Diagnosis not present

## 2022-06-20 DIAGNOSIS — Z713 Dietary counseling and surveillance: Secondary | ICD-10-CM | POA: Diagnosis not present

## 2022-06-20 DIAGNOSIS — S91301A Unspecified open wound, right foot, initial encounter: Secondary | ICD-10-CM | POA: Diagnosis not present

## 2022-06-20 DIAGNOSIS — Z794 Long term (current) use of insulin: Secondary | ICD-10-CM | POA: Diagnosis not present

## 2022-06-20 NOTE — Progress Notes (Signed)
Diabetes Self-Management Education  Visit Type: First/Initial  Appt. Start Time: 9163 Appt. End Time: 8466  06/21/2022  Courtney Parks, identified by name and date of birth, is a 61 y.o. female with a diagnosis of Diabetes: Type 2.   ASSESSMENT Patient is here today with her son Courtney Parks.  She states that she has a sweet tooth especially when stressed. She would like to lose weight.  Discussed that first goal is wound healing. She tried plant based eating when she was first diagnosed with diabetes but stopped as she enjoyed eating meat.  She does not wish to stop eating meat now.  She has a foot wound for the past 3 months.  Diabetes with variable control based on what is happening in her life.  Worse control over the past 2 years (sister passed away 2019-11-25, cares for her mother who is in poor health, and working nights).  History includes:  Type 2 Diabetes (1997), HLD, HTN, CKD, depression/anxiety, neuropathy Medication includes Trulicity 3 mg weekly, 58 units Toujeo q am, turmeric, vitamin c, zinc, MVI Labs noted to include:  A1C 10.3% 03/29/2022 and 10.5% 03/29/2022 CGM FreeStyle Libre 2  Current CGM data: CGM use % of time  69  2-week average/GV    Time in range  81  % Time Above 180 16  % Time above 250 2  % Time Below 70 <1  Glucose control has improved and wound is healing over the past 2 weeks compared to other sensor data.  Pictures of wound viewed with significant changes!  [name] (home health nurse is treating her).  Height '5\' 5"'$  (1.651 m), weight 180 lb (81.6 kg). Body mass index is 29.95 kg/m.   Diabetes Self-Management Education - 06/20/22 1327       Visit Information   Visit Type First/Initial      Initial Visit   Diabetes Type Type 2    Date Diagnosed 1997    Are you currently following a meal plan? No    Are you taking your medications as prescribed? Yes      Health Coping   How would you rate your overall health? Other (comment)   improving      Psychosocial Assessment   Patient Belief/Attitude about Diabetes Motivated to manage diabetes    What is the hardest part about your diabetes right now, causing you the most concern, or is the most worrisome to you about your diabetes?   Being active;Making healty food and beverage choices;Other (comment)   stress   Self-care barriers None    Self-management support Doctor's office;Family    Other persons present Patient;Friend    Patient Concerns Nutrition/Meal planning    Special Needs None    Preferred Learning Style No preference indicated    Learning Readiness Ready    How often do you need to have someone help you when you read instructions, pamphlets, or other written materials from your doctor or pharmacy? 1 - Never    What is the last grade level you completed in school? 2 years college      Pre-Education Assessment   Patient understands the diabetes disease and treatment process. Needs Review    Patient understands incorporating nutritional management into lifestyle. Needs Review    Patient undertands incorporating physical activity into lifestyle. Needs Review    Patient understands using medications safely. Needs Review    Patient understands monitoring blood glucose, interpreting and using results Needs Review    Patient understands prevention, detection, and  treatment of acute complications. Needs Review    Patient understands prevention, detection, and treatment of chronic complications. Needs Review    Patient understands how to develop strategies to address psychosocial issues. Needs Review    Patient understands how to develop strategies to promote health/change behavior. Needs Review      Complications   Last HgB A1C per patient/outside source 2014 %    How often do you check your blood sugar? > 4 times/day    Fasting Blood glucose range (mg/dL) 70-129;130-179;180-200    Postprandial Blood glucose range (mg/dL) 130-179    Number of hypoglycemic episodes per month  2    Can you tell when your blood sugar is low? Yes    What do you do if your blood sugar is low? glucose tabs    Number of hyperglycemic episodes ( >'200mg'$ /dL): Weekly    Can you tell when your blood sugar is high? No    What do you do if your blood sugar is high? exercise    Have you had a dilated eye exam in the past 12 months? Yes    Have you had a dental exam in the past 12 months? No    Are you checking your feet? Yes    How many days per week are you checking your feet? 7      Dietary Intake   Breakfast yogurt (activia), keto bread, egg, occasional bacon, sugar free grape jelly, coffee with 4 sweet and lows, 1/4 cup whole milk    Lunch carrots, tomatoes, cashews, pistachios, potato chips    Dinner pepperoni chicken, pizza sauce, mozerella, raw veggies, surgar free chocolate chips or sugar free candy    Snack (evening) occasional nuts    Beverage(s) water, coffee with 4 sweet and lows, 1/4 cup whole milk, diet Pepsi or diet Dr. Malachi Bonds, tea with sweet and low      Activity / Exercise   Activity / Exercise Type Light (walking / raking leaves)    How many days per week do you exercise? 5    How many minutes per day do you exercise? 20    Total minutes per week of exercise 100      Patient Education   Previous Diabetes Education Yes (please comment)   2014   Disease Pathophysiology Other (comment)   review of insulin resistance   Healthy Eating Role of diet in the treatment of diabetes and the relationship between the three main macronutrients and blood glucose level;Food label reading, portion sizes and measuring food.;Plate Method;Meal options for control of blood glucose level and chronic complications.    Being Active Role of exercise on diabetes management, blood pressure control and cardiac health.;Helped patient identify appropriate exercises in relation to his/her diabetes, diabetes complications and other health issue.    Medications Reviewed patients medication for diabetes,  action, purpose, timing of dose and side effects.    Monitoring Taught/evaluated CGM (comment)      Individualized Goals (developed by patient)   Nutrition General guidelines for healthy choices and portions discussed    Physical Activity Exercise 5-7 days per week;15 minutes per day    Medications take my medication as prescribed    Monitoring  Consistenly use CGM    Problem Solving Eating Pattern    Reducing Risk treat hypoglycemia with 15 grams of carbs if blood glucose less than '70mg'$ /dL;do foot checks daily;examine blood glucose patterns    Health Coping Discuss barriers to diabetes care with support person/system (comment specifics  as needed)      Post-Education Assessment   Patient understands the diabetes disease and treatment process. Demonstrates understanding / competency    Patient understands incorporating nutritional management into lifestyle. Needs Review    Patient undertands incorporating physical activity into lifestyle. Comprehends key points    Patient understands using medications safely. Comphrehends key points    Patient understands monitoring blood glucose, interpreting and using results Comprehends key points    Patient understands prevention, detection, and treatment of acute complications. Comprehends key points    Patient understands prevention, detection, and treatment of chronic complications. Comprehends key points    Patient understands how to develop strategies to address psychosocial issues. Comprehends key points    Patient understands how to develop strategies to promote health/change behavior. Needs Review      Outcomes   Expected Outcomes Demonstrated interest in learning. Expect positive outcomes    Future DMSE 2 months    Program Status Not Completed             Individualized Plan for Diabetes Self-Management Training:   Learning Objective:  Patient will have a greater understanding of diabetes self-management. Patient education plan is to  attend individual and/or group sessions per assessed needs and concerns.   Plan:   Patient Instructions    Lunch ideas: Consider bean soup and raw vegetables Large salad topped with rinsed beans  Continue armchair exercises particularly after meals. Stay as active as you can  Eat more Non-Starchy Vegetables These include greens, broccoli, cauliflower, cabbage, carrots, beets, eggplant, peppers, squash and others. Minimize added sugars and refined grains Rethink what you drink.  Choose beverages without added sugar.  Look for 0 carbs on the label. See the list of whole grains below.  Find alternatives to usual sweet treats. Choose whole foods over processed. Make simple meals at home more often than eating out.  Tips to increase fiber in your diet: (All plants have fiber.  Eat a variety. There are more than are on this list.) Slowly increase the amount of fiber you eat to 25-35 grams per day.  (More is fine if you tolerate it.) Fiber from whole grains, nuts and seeds Quinoa, 1/2 cup = 5 grams Bulgur, 1/2 cup = 4.1 grams Popcorn, 3 cups = 3.6 grams Whole Wheat Spaghetti, 1/2 cup = 3.2 grams Barley, 1/2 cup = 3 grams Oatmeal, 1/2 cup = 2 grams Whole Wheat English Muffin = 3 grams Corn, 1/2 cup = 2.1 grams Brown Rice, 1/2 cup = 1.8 grams Flax seeds, 1 Tablespoon = 2.8 grams Chia seeds, 1 Tablespoon = 11 grams Almonds, 1 ounce = 3.5 grams fiber Fiber from legumes Kidney beans, 1/2 cup 7.9 grams Lentils, 1/2 cup = 7.8 grams Pinto beans, 1/2 cup = 7.7 grams Black beans, 1/2 cup = 7.6 grams Lima beans, 1/2 cup 6.4 grams Chick peas, 1/2 cup = 5.3 grams Black eyed peas, 1/2 cup = 4 grams Fiber from fruits and vegetables Pear, 6 grams Apple. 3.3 grams Raspberries or Blackberries, 3/4 cup = 6 grams Strawberries or Blueberries, 1 cup = 3.4 grams Baked sweet potato 3.8 grams fiber Baked potato with skin 4.4 grams  Peas, 1/2 cup = 4.4 grams  Spinach, 1/2 cup cooked = 3.5 grams   Avocado, 1/2 = 5 grams        Expected Outcomes:  Demonstrated interest in learning. Expect positive outcomes  Education material provided: ADA - How to Thrive: A Guide for Your Journey with Diabetes, Food label handouts,  Meal plan card, Snack sheet, and Diabetes Resources, ACL[employer] [facility]) packet  If problems or questions, patient to contact team via:  Phone  Future DSME appointment: 2 months  Weight hx:   65" 180 lbs 04/2022  Estimated needs for wound healing: 2000 calories per day 120 grams protein per day  Patient's son lives with her currently.  She is in a wheel chair/crutches due to no weight bearing on her right foot.  Her son is shopping and cooking. 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  She is a Marine scientist.  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 Shellfish allergy. Was going to the gym routinely in 2022 until she hurt her back. Carnivore diet in at the beginning of 2023.  Stopped as she likes vegetables.

## 2022-06-20 NOTE — Patient Instructions (Signed)
   Lunch ideas: Consider bean soup and raw vegetables Large salad topped with rinsed beans  Continue armchair exercises particularly after meals. Stay as active as you can  Eat more Non-Starchy Vegetables These include greens, broccoli, cauliflower, cabbage, carrots, beets, eggplant, peppers, squash and others. Minimize added sugars and refined grains Rethink what you drink.  Choose beverages without added sugar.  Look for 0 carbs on the label. See the list of whole grains below.  Find alternatives to usual sweet treats. Choose whole foods over processed. Make simple meals at home more often than eating out.  Tips to increase fiber in your diet: (All plants have fiber.  Eat a variety. There are more than are on this list.) Slowly increase the amount of fiber you eat to 25-35 grams per day.  (More is fine if you tolerate it.) Fiber from whole grains, nuts and seeds Quinoa, 1/2 cup = 5 grams Bulgur, 1/2 cup = 4.1 grams Popcorn, 3 cups = 3.6 grams Whole Wheat Spaghetti, 1/2 cup = 3.2 grams Barley, 1/2 cup = 3 grams Oatmeal, 1/2 cup = 2 grams Whole Wheat English Muffin = 3 grams Corn, 1/2 cup = 2.1 grams Brown Rice, 1/2 cup = 1.8 grams Flax seeds, 1 Tablespoon = 2.8 grams Chia seeds, 1 Tablespoon = 11 grams Almonds, 1 ounce = 3.5 grams fiber Fiber from legumes Kidney beans, 1/2 cup 7.9 grams Lentils, 1/2 cup = 7.8 grams Pinto beans, 1/2 cup = 7.7 grams Black beans, 1/2 cup = 7.6 grams Lima beans, 1/2 cup 6.4 grams Chick peas, 1/2 cup = 5.3 grams Black eyed peas, 1/2 cup = 4 grams Fiber from fruits and vegetables Pear, 6 grams Apple. 3.3 grams Raspberries or Blackberries, 3/4 cup = 6 grams Strawberries or Blueberries, 1 cup = 3.4 grams Baked sweet potato 3.8 grams fiber Baked potato with skin 4.4 grams  Peas, 1/2 cup = 4.4 grams  Spinach, 1/2 cup cooked = 3.5 grams  Avocado, 1/2 = 5 grams

## 2022-06-21 DIAGNOSIS — L03115 Cellulitis of right lower limb: Secondary | ICD-10-CM | POA: Diagnosis not present

## 2022-06-21 DIAGNOSIS — E7800 Pure hypercholesterolemia, unspecified: Secondary | ICD-10-CM | POA: Diagnosis not present

## 2022-06-21 DIAGNOSIS — S91331D Puncture wound without foreign body, right foot, subsequent encounter: Secondary | ICD-10-CM | POA: Diagnosis not present

## 2022-06-21 DIAGNOSIS — Z7985 Long-term (current) use of injectable non-insulin antidiabetic drugs: Secondary | ICD-10-CM | POA: Diagnosis not present

## 2022-06-21 DIAGNOSIS — Z791 Long term (current) use of non-steroidal anti-inflammatories (NSAID): Secondary | ICD-10-CM | POA: Diagnosis not present

## 2022-06-21 DIAGNOSIS — F32A Depression, unspecified: Secondary | ICD-10-CM | POA: Diagnosis not present

## 2022-06-21 DIAGNOSIS — Z9181 History of falling: Secondary | ICD-10-CM | POA: Diagnosis not present

## 2022-06-21 DIAGNOSIS — Z794 Long term (current) use of insulin: Secondary | ICD-10-CM | POA: Diagnosis not present

## 2022-06-21 DIAGNOSIS — T8189XA Other complications of procedures, not elsewhere classified, initial encounter: Secondary | ICD-10-CM | POA: Diagnosis not present

## 2022-06-21 DIAGNOSIS — I10 Essential (primary) hypertension: Secondary | ICD-10-CM | POA: Diagnosis not present

## 2022-06-21 DIAGNOSIS — N200 Calculus of kidney: Secondary | ICD-10-CM | POA: Diagnosis not present

## 2022-06-21 DIAGNOSIS — F419 Anxiety disorder, unspecified: Secondary | ICD-10-CM | POA: Diagnosis not present

## 2022-06-21 DIAGNOSIS — L02611 Cutaneous abscess of right foot: Secondary | ICD-10-CM | POA: Diagnosis not present

## 2022-06-21 DIAGNOSIS — S91301A Unspecified open wound, right foot, initial encounter: Secondary | ICD-10-CM | POA: Diagnosis not present

## 2022-06-21 DIAGNOSIS — Z87440 Personal history of urinary (tract) infections: Secondary | ICD-10-CM | POA: Diagnosis not present

## 2022-06-21 DIAGNOSIS — E1165 Type 2 diabetes mellitus with hyperglycemia: Secondary | ICD-10-CM | POA: Diagnosis not present

## 2022-06-21 DIAGNOSIS — Z87891 Personal history of nicotine dependence: Secondary | ICD-10-CM | POA: Diagnosis not present

## 2022-06-21 DIAGNOSIS — B951 Streptococcus, group B, as the cause of diseases classified elsewhere: Secondary | ICD-10-CM | POA: Diagnosis not present

## 2022-06-22 DIAGNOSIS — S91301A Unspecified open wound, right foot, initial encounter: Secondary | ICD-10-CM | POA: Diagnosis not present

## 2022-06-22 DIAGNOSIS — T8189XA Other complications of procedures, not elsewhere classified, initial encounter: Secondary | ICD-10-CM | POA: Diagnosis not present

## 2022-06-23 DIAGNOSIS — S91301A Unspecified open wound, right foot, initial encounter: Secondary | ICD-10-CM | POA: Diagnosis not present

## 2022-06-23 DIAGNOSIS — T8189XA Other complications of procedures, not elsewhere classified, initial encounter: Secondary | ICD-10-CM | POA: Diagnosis not present

## 2022-06-24 DIAGNOSIS — Z9181 History of falling: Secondary | ICD-10-CM | POA: Diagnosis not present

## 2022-06-24 DIAGNOSIS — S91301A Unspecified open wound, right foot, initial encounter: Secondary | ICD-10-CM | POA: Diagnosis not present

## 2022-06-24 DIAGNOSIS — N200 Calculus of kidney: Secondary | ICD-10-CM | POA: Diagnosis not present

## 2022-06-24 DIAGNOSIS — Z794 Long term (current) use of insulin: Secondary | ICD-10-CM | POA: Diagnosis not present

## 2022-06-24 DIAGNOSIS — F32A Depression, unspecified: Secondary | ICD-10-CM | POA: Diagnosis not present

## 2022-06-24 DIAGNOSIS — I10 Essential (primary) hypertension: Secondary | ICD-10-CM | POA: Diagnosis not present

## 2022-06-24 DIAGNOSIS — Z87891 Personal history of nicotine dependence: Secondary | ICD-10-CM | POA: Diagnosis not present

## 2022-06-24 DIAGNOSIS — S91331D Puncture wound without foreign body, right foot, subsequent encounter: Secondary | ICD-10-CM | POA: Diagnosis not present

## 2022-06-24 DIAGNOSIS — E7800 Pure hypercholesterolemia, unspecified: Secondary | ICD-10-CM | POA: Diagnosis not present

## 2022-06-24 DIAGNOSIS — T8189XA Other complications of procedures, not elsewhere classified, initial encounter: Secondary | ICD-10-CM | POA: Diagnosis not present

## 2022-06-24 DIAGNOSIS — Z7985 Long-term (current) use of injectable non-insulin antidiabetic drugs: Secondary | ICD-10-CM | POA: Diagnosis not present

## 2022-06-24 DIAGNOSIS — B951 Streptococcus, group B, as the cause of diseases classified elsewhere: Secondary | ICD-10-CM | POA: Diagnosis not present

## 2022-06-24 DIAGNOSIS — L02611 Cutaneous abscess of right foot: Secondary | ICD-10-CM | POA: Diagnosis not present

## 2022-06-24 DIAGNOSIS — L03115 Cellulitis of right lower limb: Secondary | ICD-10-CM | POA: Diagnosis not present

## 2022-06-24 DIAGNOSIS — F419 Anxiety disorder, unspecified: Secondary | ICD-10-CM | POA: Diagnosis not present

## 2022-06-24 DIAGNOSIS — Z87440 Personal history of urinary (tract) infections: Secondary | ICD-10-CM | POA: Diagnosis not present

## 2022-06-24 DIAGNOSIS — E1165 Type 2 diabetes mellitus with hyperglycemia: Secondary | ICD-10-CM | POA: Diagnosis not present

## 2022-06-24 DIAGNOSIS — Z791 Long term (current) use of non-steroidal anti-inflammatories (NSAID): Secondary | ICD-10-CM | POA: Diagnosis not present

## 2022-06-25 DIAGNOSIS — T8189XA Other complications of procedures, not elsewhere classified, initial encounter: Secondary | ICD-10-CM | POA: Diagnosis not present

## 2022-06-25 DIAGNOSIS — S91301A Unspecified open wound, right foot, initial encounter: Secondary | ICD-10-CM | POA: Diagnosis not present

## 2022-06-26 DIAGNOSIS — Z87440 Personal history of urinary (tract) infections: Secondary | ICD-10-CM | POA: Diagnosis not present

## 2022-06-26 DIAGNOSIS — Z794 Long term (current) use of insulin: Secondary | ICD-10-CM | POA: Diagnosis not present

## 2022-06-26 DIAGNOSIS — F419 Anxiety disorder, unspecified: Secondary | ICD-10-CM | POA: Diagnosis not present

## 2022-06-26 DIAGNOSIS — Z791 Long term (current) use of non-steroidal anti-inflammatories (NSAID): Secondary | ICD-10-CM | POA: Diagnosis not present

## 2022-06-26 DIAGNOSIS — E1165 Type 2 diabetes mellitus with hyperglycemia: Secondary | ICD-10-CM | POA: Diagnosis not present

## 2022-06-26 DIAGNOSIS — L03115 Cellulitis of right lower limb: Secondary | ICD-10-CM | POA: Diagnosis not present

## 2022-06-26 DIAGNOSIS — E7800 Pure hypercholesterolemia, unspecified: Secondary | ICD-10-CM | POA: Diagnosis not present

## 2022-06-26 DIAGNOSIS — F32A Depression, unspecified: Secondary | ICD-10-CM | POA: Diagnosis not present

## 2022-06-26 DIAGNOSIS — Z7985 Long-term (current) use of injectable non-insulin antidiabetic drugs: Secondary | ICD-10-CM | POA: Diagnosis not present

## 2022-06-26 DIAGNOSIS — B951 Streptococcus, group B, as the cause of diseases classified elsewhere: Secondary | ICD-10-CM | POA: Diagnosis not present

## 2022-06-26 DIAGNOSIS — T8189XA Other complications of procedures, not elsewhere classified, initial encounter: Secondary | ICD-10-CM | POA: Diagnosis not present

## 2022-06-26 DIAGNOSIS — S91301A Unspecified open wound, right foot, initial encounter: Secondary | ICD-10-CM | POA: Diagnosis not present

## 2022-06-26 DIAGNOSIS — L02611 Cutaneous abscess of right foot: Secondary | ICD-10-CM | POA: Diagnosis not present

## 2022-06-26 DIAGNOSIS — Z87891 Personal history of nicotine dependence: Secondary | ICD-10-CM | POA: Diagnosis not present

## 2022-06-26 DIAGNOSIS — Z9181 History of falling: Secondary | ICD-10-CM | POA: Diagnosis not present

## 2022-06-26 DIAGNOSIS — S91331D Puncture wound without foreign body, right foot, subsequent encounter: Secondary | ICD-10-CM | POA: Diagnosis not present

## 2022-06-26 DIAGNOSIS — I10 Essential (primary) hypertension: Secondary | ICD-10-CM | POA: Diagnosis not present

## 2022-06-26 DIAGNOSIS — N200 Calculus of kidney: Secondary | ICD-10-CM | POA: Diagnosis not present

## 2022-06-27 DIAGNOSIS — S91301A Unspecified open wound, right foot, initial encounter: Secondary | ICD-10-CM | POA: Diagnosis not present

## 2022-06-27 DIAGNOSIS — T8189XA Other complications of procedures, not elsewhere classified, initial encounter: Secondary | ICD-10-CM | POA: Diagnosis not present

## 2022-06-28 DIAGNOSIS — S91301A Unspecified open wound, right foot, initial encounter: Secondary | ICD-10-CM | POA: Diagnosis not present

## 2022-06-28 DIAGNOSIS — E1165 Type 2 diabetes mellitus with hyperglycemia: Secondary | ICD-10-CM | POA: Diagnosis not present

## 2022-06-28 DIAGNOSIS — Z8639 Personal history of other endocrine, nutritional and metabolic disease: Secondary | ICD-10-CM | POA: Diagnosis not present

## 2022-06-28 DIAGNOSIS — T8189XA Other complications of procedures, not elsewhere classified, initial encounter: Secondary | ICD-10-CM | POA: Diagnosis not present

## 2022-06-29 DIAGNOSIS — T8189XA Other complications of procedures, not elsewhere classified, initial encounter: Secondary | ICD-10-CM | POA: Diagnosis not present

## 2022-06-29 DIAGNOSIS — S91301A Unspecified open wound, right foot, initial encounter: Secondary | ICD-10-CM | POA: Diagnosis not present

## 2022-06-30 DIAGNOSIS — T8189XA Other complications of procedures, not elsewhere classified, initial encounter: Secondary | ICD-10-CM | POA: Diagnosis not present

## 2022-06-30 DIAGNOSIS — S91301A Unspecified open wound, right foot, initial encounter: Secondary | ICD-10-CM | POA: Diagnosis not present

## 2022-07-01 DIAGNOSIS — S91301A Unspecified open wound, right foot, initial encounter: Secondary | ICD-10-CM | POA: Diagnosis not present

## 2022-07-01 DIAGNOSIS — T8189XA Other complications of procedures, not elsewhere classified, initial encounter: Secondary | ICD-10-CM | POA: Diagnosis not present

## 2022-07-02 ENCOUNTER — Telehealth: Payer: Self-pay | Admitting: *Deleted

## 2022-07-02 DIAGNOSIS — T8189XA Other complications of procedures, not elsewhere classified, initial encounter: Secondary | ICD-10-CM | POA: Diagnosis not present

## 2022-07-02 DIAGNOSIS — S91301A Unspecified open wound, right foot, initial encounter: Secondary | ICD-10-CM | POA: Diagnosis not present

## 2022-07-02 NOTE — Telephone Encounter (Signed)
[name] w/ [facility] is calling to let the physician know that patient has decided to go back to work so they will be discharging her since she is no longer home bound.

## 2022-07-03 DIAGNOSIS — S91301A Unspecified open wound, right foot, initial encounter: Secondary | ICD-10-CM | POA: Diagnosis not present

## 2022-07-03 DIAGNOSIS — T8189XA Other complications of procedures, not elsewhere classified, initial encounter: Secondary | ICD-10-CM | POA: Diagnosis not present

## 2022-07-04 DIAGNOSIS — T8189XA Other complications of procedures, not elsewhere classified, initial encounter: Secondary | ICD-10-CM | POA: Diagnosis not present

## 2022-07-04 DIAGNOSIS — S91301A Unspecified open wound, right foot, initial encounter: Secondary | ICD-10-CM | POA: Diagnosis not present

## 2022-07-05 DIAGNOSIS — T8189XA Other complications of procedures, not elsewhere classified, initial encounter: Secondary | ICD-10-CM | POA: Diagnosis not present

## 2022-07-05 DIAGNOSIS — S91301A Unspecified open wound, right foot, initial encounter: Secondary | ICD-10-CM | POA: Diagnosis not present

## 2022-07-06 DIAGNOSIS — T8189XA Other complications of procedures, not elsewhere classified, initial encounter: Secondary | ICD-10-CM | POA: Diagnosis not present

## 2022-07-06 DIAGNOSIS — S91301A Unspecified open wound, right foot, initial encounter: Secondary | ICD-10-CM | POA: Diagnosis not present

## 2022-07-07 DIAGNOSIS — S91301A Unspecified open wound, right foot, initial encounter: Secondary | ICD-10-CM | POA: Diagnosis not present

## 2022-07-07 DIAGNOSIS — T8189XA Other complications of procedures, not elsewhere classified, initial encounter: Secondary | ICD-10-CM | POA: Diagnosis not present

## 2022-07-08 DIAGNOSIS — S91301A Unspecified open wound, right foot, initial encounter: Secondary | ICD-10-CM | POA: Diagnosis not present

## 2022-07-08 DIAGNOSIS — T8189XA Other complications of procedures, not elsewhere classified, initial encounter: Secondary | ICD-10-CM | POA: Diagnosis not present

## 2022-07-09 DIAGNOSIS — T8189XA Other complications of procedures, not elsewhere classified, initial encounter: Secondary | ICD-10-CM | POA: Diagnosis not present

## 2022-07-09 DIAGNOSIS — S91301A Unspecified open wound, right foot, initial encounter: Secondary | ICD-10-CM | POA: Diagnosis not present

## 2022-07-10 DIAGNOSIS — T8189XA Other complications of procedures, not elsewhere classified, initial encounter: Secondary | ICD-10-CM | POA: Diagnosis not present

## 2022-07-10 DIAGNOSIS — S91301A Unspecified open wound, right foot, initial encounter: Secondary | ICD-10-CM | POA: Diagnosis not present

## 2022-07-11 DIAGNOSIS — S91301A Unspecified open wound, right foot, initial encounter: Secondary | ICD-10-CM | POA: Diagnosis not present

## 2022-07-11 DIAGNOSIS — T8189XA Other complications of procedures, not elsewhere classified, initial encounter: Secondary | ICD-10-CM | POA: Diagnosis not present

## 2022-07-12 ENCOUNTER — Ambulatory Visit (INDEPENDENT_AMBULATORY_CARE_PROVIDER_SITE_OTHER): Payer: Self-pay | Admitting: Podiatry

## 2022-07-12 DIAGNOSIS — E119 Type 2 diabetes mellitus without complications: Secondary | ICD-10-CM

## 2022-07-12 DIAGNOSIS — Z794 Long term (current) use of insulin: Secondary | ICD-10-CM

## 2022-07-12 DIAGNOSIS — L97512 Non-pressure chronic ulcer of other part of right foot with fat layer exposed: Secondary | ICD-10-CM

## 2022-07-12 DIAGNOSIS — T8189XA Other complications of procedures, not elsewhere classified, initial encounter: Secondary | ICD-10-CM | POA: Diagnosis not present

## 2022-07-12 DIAGNOSIS — S91301A Unspecified open wound, right foot, initial encounter: Secondary | ICD-10-CM | POA: Diagnosis not present

## 2022-07-12 NOTE — Progress Notes (Signed)
Subjective:  Patient ID: Courtney Parks, female    DOB: Jul 19, 1961,  MRN: [id number]  Chief Complaint  Patient presents with   Foot Ulcer    DOS: 04/03/2022 Procedure: Incision and drainage debridement and washout of the right foot  61 y.o. female returns for post-op check.  Patient states she is doing well.  The wound VAC has helped considerably.  She denies any other acute complaints.  Bandages clean dry and intact.  It is becoming more granular in nature.  Review of Systems: Negative except as noted in the HPI. Denies N/V/F/Ch.  Past Medical History:  Diagnosis Date   Anxiety    Depression    Diabetes mellitus, type II ([place])    Hyperlipidemia    Hypertension    Kidney stones     Current Outpatient Medications:    acetaminophen (TYLENOL) 500 MG tablet, Take 500 mg by mouth every 6 (six) hours as needed for moderate pain., Disp: , Rfl:    aspirin EC 81 MG tablet, Take 81 mg by mouth daily. Swallow whole., Disp: , Rfl:    Continuous Blood Gluc Sensor (FREESTYLE LIBRE 2 SENSOR) MISC, 1 each by Does not apply route PRO., Disp: 1 each, Rfl: 4   Dulaglutide (TRULICITY) 1.5 PJ/8.2NK SOPN, Inject 1.5 mg into the skin once a week., Disp: , Rfl:    ibuprofen (ADVIL) 200 MG tablet, Take 200-800 mg by mouth every 6 (six) hours as needed for moderate pain., Disp: , Rfl:    insulin glargine, 1 Unit Dial, (TOUJEO SOLOSTAR) 300 UNIT/ML Solostar Pen, Inject 42 Units into the skin daily in the afternoon., Disp: 1.5 mL, Rfl: 1   Insulin Pen Needle (PEN NEEDLES 3/16") 31G X 5 MM MISC, 100 each by Does not apply route 4 (four) times daily., Disp: 100 each, Rfl: 3   Magnesium 100 MG CAPS, Take by mouth., Disp: , Rfl:    Multiple Vitamin (MULTIVITAMIN) capsule, Take 1 capsule by mouth every other day., Disp: , Rfl:    Probiotic Product (PROBIOTIC BLEND PO), Take by mouth., Disp: , Rfl:    senna-docusate (SENOKOT-S) 8.6-50 MG tablet, Take 1 tablet by mouth at bedtime as needed for moderate  constipation., Disp: 60 tablet, Rfl: 1   Turmeric 500 MG CAPS, Take by mouth., Disp: , Rfl:    vitamin C (ASCORBIC ACID) 250 MG tablet, Take 250 mg by mouth daily., Disp: , Rfl:    zinc gluconate 50 MG tablet, Take 50 mg by mouth daily., Disp: , Rfl:   Social History   Tobacco Use  Smoking Status Former   Years: 5.00   Types: Cigarettes   Quit date: 12/12/1983   Years since quitting: 38.6  Smokeless Tobacco Never    Allergies  Allergen Reactions   Codeine     REACTION: Nausea   Demerol Nausea And Vomiting   Other Other (See Comments)    Narcotics- drops her blood pressure   Shellfish Allergy Swelling   Objective:  There were no vitals filed for this visit. There is no height or weight on file to calculate BMI. Constitutional Well developed. Well nourished.  Vascular Foot warm and well perfused. Capillary refill normal to all digits.   Neurologic Normal speech. Oriented to person, place, and time. Epicritic sensation to light touch grossly present bilaterally.  Dermatologic Wound size as below.  Mostly granular wound bed noted.  No fibrotic tissue noted.  Improving considerably.  Orthopedic: No further tenderness to palpation noted about the surgical site.  Radiographs: None Assessment:   1. Ulcerated, foot, right, with fat layer exposed ([place])   2. Type 2 diabetes mellitus without complication, with long-term current use of insulin ([place])     Plan:  Patient was evaluated and treated and all questions answered.  S/p foot surgery right -Progressing as expected post-operatively. -XR: See above -WB Status: Nonweightbearing in right lower extremity -Medications: None -Foot redressed. -The right foot wound measurements 1.5 cm x 1 cm 0.4cm -Discontinue wound VAC.  We will do local wound care for now.  No follow-ups on file.

## 2022-07-13 DIAGNOSIS — S91301A Unspecified open wound, right foot, initial encounter: Secondary | ICD-10-CM | POA: Diagnosis not present

## 2022-07-13 DIAGNOSIS — T8189XA Other complications of procedures, not elsewhere classified, initial encounter: Secondary | ICD-10-CM | POA: Diagnosis not present

## 2022-07-14 DIAGNOSIS — S91301A Unspecified open wound, right foot, initial encounter: Secondary | ICD-10-CM | POA: Diagnosis not present

## 2022-07-14 DIAGNOSIS — T8189XA Other complications of procedures, not elsewhere classified, initial encounter: Secondary | ICD-10-CM | POA: Diagnosis not present

## 2022-07-15 DIAGNOSIS — T8189XA Other complications of procedures, not elsewhere classified, initial encounter: Secondary | ICD-10-CM | POA: Diagnosis not present

## 2022-07-15 DIAGNOSIS — S91301A Unspecified open wound, right foot, initial encounter: Secondary | ICD-10-CM | POA: Diagnosis not present

## 2022-07-16 DIAGNOSIS — S91301A Unspecified open wound, right foot, initial encounter: Secondary | ICD-10-CM | POA: Diagnosis not present

## 2022-07-16 DIAGNOSIS — T8189XA Other complications of procedures, not elsewhere classified, initial encounter: Secondary | ICD-10-CM | POA: Diagnosis not present

## 2022-07-17 DIAGNOSIS — T8189XA Other complications of procedures, not elsewhere classified, initial encounter: Secondary | ICD-10-CM | POA: Diagnosis not present

## 2022-07-17 DIAGNOSIS — S91301A Unspecified open wound, right foot, initial encounter: Secondary | ICD-10-CM | POA: Diagnosis not present

## 2022-07-22 ENCOUNTER — Encounter: Payer: Self-pay | Admitting: Podiatry

## 2022-08-06 ENCOUNTER — Emergency Department (HOSPITAL_BASED_OUTPATIENT_CLINIC_OR_DEPARTMENT_OTHER): Payer: 59

## 2022-08-06 ENCOUNTER — Other Ambulatory Visit (HOSPITAL_BASED_OUTPATIENT_CLINIC_OR_DEPARTMENT_OTHER): Payer: Self-pay

## 2022-08-06 ENCOUNTER — Encounter (HOSPITAL_BASED_OUTPATIENT_CLINIC_OR_DEPARTMENT_OTHER): Payer: Self-pay

## 2022-08-06 ENCOUNTER — Other Ambulatory Visit: Payer: Self-pay

## 2022-08-06 ENCOUNTER — Emergency Department (HOSPITAL_BASED_OUTPATIENT_CLINIC_OR_DEPARTMENT_OTHER)
Admission: EM | Admit: 2022-08-06 | Discharge: 2022-08-06 | Disposition: A | Discharge status: Home / Self Care | Payer: 59 | Source: Home / Self Care | Attending: Emergency Medicine | Admitting: Emergency Medicine

## 2022-08-06 DIAGNOSIS — Z888 Allergy status to other drugs, medicaments and biological substances status: Secondary | ICD-10-CM | POA: Diagnosis not present

## 2022-08-06 DIAGNOSIS — M25551 Pain in right hip: Secondary | ICD-10-CM | POA: Diagnosis not present

## 2022-08-06 DIAGNOSIS — R160 Hepatomegaly, not elsewhere classified: Secondary | ICD-10-CM | POA: Diagnosis not present

## 2022-08-06 DIAGNOSIS — Z7982 Long term (current) use of aspirin: Secondary | ICD-10-CM | POA: Insufficient documentation

## 2022-08-06 DIAGNOSIS — N3000 Acute cystitis without hematuria: Secondary | ICD-10-CM | POA: Diagnosis not present

## 2022-08-06 DIAGNOSIS — U071 COVID-19: Secondary | ICD-10-CM | POA: Diagnosis not present

## 2022-08-06 DIAGNOSIS — Z87891 Personal history of nicotine dependence: Secondary | ICD-10-CM | POA: Insufficient documentation

## 2022-08-06 DIAGNOSIS — R0602 Shortness of breath: Secondary | ICD-10-CM | POA: Diagnosis not present

## 2022-08-06 DIAGNOSIS — E663 Overweight: Secondary | ICD-10-CM | POA: Diagnosis not present

## 2022-08-06 DIAGNOSIS — Z9049 Acquired absence of other specified parts of digestive tract: Secondary | ICD-10-CM | POA: Diagnosis not present

## 2022-08-06 DIAGNOSIS — R1110 Vomiting, unspecified: Secondary | ICD-10-CM | POA: Diagnosis not present

## 2022-08-06 DIAGNOSIS — F32A Depression, unspecified: Secondary | ICD-10-CM | POA: Diagnosis not present

## 2022-08-06 DIAGNOSIS — R079 Chest pain, unspecified: Secondary | ICD-10-CM | POA: Diagnosis not present

## 2022-08-06 DIAGNOSIS — E1065 Type 1 diabetes mellitus with hyperglycemia: Secondary | ICD-10-CM | POA: Diagnosis not present

## 2022-08-06 DIAGNOSIS — F419 Anxiety disorder, unspecified: Secondary | ICD-10-CM | POA: Diagnosis present

## 2022-08-06 DIAGNOSIS — I959 Hypotension, unspecified: Secondary | ICD-10-CM | POA: Diagnosis not present

## 2022-08-06 DIAGNOSIS — Z794 Long term (current) use of insulin: Secondary | ICD-10-CM | POA: Diagnosis not present

## 2022-08-06 DIAGNOSIS — N390 Urinary tract infection, site not specified: Secondary | ICD-10-CM | POA: Diagnosis not present

## 2022-08-06 DIAGNOSIS — N289 Disorder of kidney and ureter, unspecified: Secondary | ICD-10-CM | POA: Diagnosis not present

## 2022-08-06 DIAGNOSIS — Z833 Family history of diabetes mellitus: Secondary | ICD-10-CM | POA: Diagnosis not present

## 2022-08-06 DIAGNOSIS — E1165 Type 2 diabetes mellitus with hyperglycemia: Secondary | ICD-10-CM | POA: Insufficient documentation

## 2022-08-06 DIAGNOSIS — M1611 Unilateral primary osteoarthritis, right hip: Secondary | ICD-10-CM | POA: Diagnosis not present

## 2022-08-06 DIAGNOSIS — B961 Klebsiella pneumoniae [K. pneumoniae] as the cause of diseases classified elsewhere: Secondary | ICD-10-CM | POA: Diagnosis not present

## 2022-08-06 DIAGNOSIS — R739 Hyperglycemia, unspecified: Secondary | ICD-10-CM

## 2022-08-06 DIAGNOSIS — J1282 Pneumonia due to coronavirus disease 2019: Secondary | ICD-10-CM | POA: Diagnosis not present

## 2022-08-06 DIAGNOSIS — Z79899 Other long term (current) drug therapy: Secondary | ICD-10-CM | POA: Diagnosis not present

## 2022-08-06 DIAGNOSIS — J90 Pleural effusion, not elsewhere classified: Secondary | ICD-10-CM | POA: Diagnosis not present

## 2022-08-06 DIAGNOSIS — K8689 Other specified diseases of pancreas: Secondary | ICD-10-CM | POA: Diagnosis not present

## 2022-08-06 DIAGNOSIS — I700 Atherosclerosis of aorta: Secondary | ICD-10-CM | POA: Diagnosis not present

## 2022-08-06 DIAGNOSIS — I10 Essential (primary) hypertension: Secondary | ICD-10-CM | POA: Diagnosis not present

## 2022-08-06 DIAGNOSIS — N281 Cyst of kidney, acquired: Secondary | ICD-10-CM | POA: Diagnosis not present

## 2022-08-06 DIAGNOSIS — E119 Type 2 diabetes mellitus without complications: Secondary | ICD-10-CM | POA: Diagnosis not present

## 2022-08-06 DIAGNOSIS — Z885 Allergy status to narcotic agent status: Secondary | ICD-10-CM | POA: Diagnosis not present

## 2022-08-06 DIAGNOSIS — N200 Calculus of kidney: Secondary | ICD-10-CM | POA: Diagnosis not present

## 2022-08-06 DIAGNOSIS — E861 Hypovolemia: Secondary | ICD-10-CM | POA: Diagnosis not present

## 2022-08-06 DIAGNOSIS — R509 Fever, unspecified: Secondary | ICD-10-CM | POA: Diagnosis not present

## 2022-08-06 DIAGNOSIS — N151 Renal and perinephric abscess: Secondary | ICD-10-CM | POA: Diagnosis not present

## 2022-08-06 DIAGNOSIS — R0902 Hypoxemia: Secondary | ICD-10-CM | POA: Diagnosis not present

## 2022-08-06 DIAGNOSIS — R112 Nausea with vomiting, unspecified: Secondary | ICD-10-CM | POA: Diagnosis not present

## 2022-08-06 DIAGNOSIS — M47817 Spondylosis without myelopathy or radiculopathy, lumbosacral region: Secondary | ICD-10-CM | POA: Diagnosis not present

## 2022-08-06 DIAGNOSIS — N2889 Other specified disorders of kidney and ureter: Secondary | ICD-10-CM | POA: Diagnosis not present

## 2022-08-06 DIAGNOSIS — E785 Hyperlipidemia, unspecified: Secondary | ICD-10-CM | POA: Diagnosis not present

## 2022-08-06 DIAGNOSIS — R109 Unspecified abdominal pain: Secondary | ICD-10-CM | POA: Diagnosis not present

## 2022-08-06 LAB — URINALYSIS, ROUTINE W REFLEX MICROSCOPIC
Bilirubin Urine: NEGATIVE
Glucose, UA: NEGATIVE mg/dL
Ketones, ur: 15 mg/dL — AB
Nitrite: NEGATIVE
Protein, ur: 30 mg/dL — AB
Specific Gravity, Urine: 1.019 (ref 1.005–1.030)
WBC, UA: >50 WBC/hpf — ABNORMAL HIGH (ref 0–5)
pH: 7.5 (ref 5.0–8.0)

## 2022-08-06 LAB — CBC WITH DIFFERENTIAL/PLATELET
Abs Immature Granulocytes: 0.06 10*3/uL (ref 0.00–0.07)
Basophils Absolute: 0 10*3/uL (ref 0.0–0.1)
Basophils Relative: 0 %
Eosinophils Absolute: 0 10*3/uL (ref 0.0–0.5)
Eosinophils Relative: 0 %
HCT: 44.4 % (ref 36.0–46.0)
Hemoglobin: 14.7 g/dL (ref 12.0–15.0)
Immature Granulocytes: 0 %
Lymphocytes Relative: 8 %
Lymphs Abs: 1.2 10*3/uL (ref 0.7–4.0)
MCH: 27.8 pg (ref 26.0–34.0)
MCHC: 33.1 g/dL (ref 30.0–36.0)
MCV: 83.9 fL (ref 80.0–100.0)
Monocytes Absolute: 1.1 10*3/uL — ABNORMAL HIGH (ref 0.1–1.0)
Monocytes Relative: 7 %
Neutro Abs: 13.1 10*3/uL — ABNORMAL HIGH (ref 1.7–7.7)
Neutrophils Relative %: 85 %
Platelets: 189 10*3/uL (ref 150–400)
RBC: 5.29 MIL/uL — ABNORMAL HIGH (ref 3.87–5.11)
RDW: 13.8 % (ref 11.5–15.5)
WBC: 15.5 10*3/uL — ABNORMAL HIGH (ref 4.0–10.5)
nRBC: 0 % (ref 0.0–0.2)

## 2022-08-06 LAB — BASIC METABOLIC PANEL
Anion gap: 13 (ref 5–15)
BUN: 13 mg/dL (ref 8–23)
CO2: 25 mmol/L (ref 22–32)
Calcium: 10 mg/dL (ref 8.9–10.3)
Chloride: 97 mmol/L — ABNORMAL LOW (ref 98–111)
Creatinine, Ser: 0.51 mg/dL (ref 0.44–1.00)
GFR, Estimated: >60 mL/min (ref >60)
Glucose, Bld: 198 mg/dL — ABNORMAL HIGH (ref 70–99)
Potassium: 4.4 mmol/L (ref 3.5–5.1)
Sodium: 135 mmol/L (ref 135–145)

## 2022-08-06 LAB — CBG MONITORING, ED: Glucose-Capillary: 180 mg/dL — ABNORMAL HIGH (ref 70–99)

## 2022-08-06 MED ORDER — SODIUM CHLORIDE 0.9 % IV SOLN: INTRAVENOUS | Status: DC

## 2022-08-06 MED ORDER — PHENAZOPYRIDINE HCL 100 MG PO TABS
200.0000 mg | ORAL_TABLET | Freq: Once | ORAL | Status: AC
  Administered 2022-08-06: 200 mg via ORAL
  Filled 2022-08-06: qty 2

## 2022-08-06 MED ORDER — PHENAZOPYRIDINE HCL 200 MG PO TABS
200.0000 mg | ORAL_TABLET | Freq: Three times a day (TID) | ORAL | 0 refills | Status: AC
  Filled 2022-08-06: qty 6, 2d supply, fill #0

## 2022-08-06 MED ORDER — SODIUM CHLORIDE 0.9 % IV SOLN
1.0000 g | Freq: Once | INTRAVENOUS | Status: AC
  Administered 2022-08-06: 1 g via INTRAVENOUS
  Filled 2022-08-06: qty 10

## 2022-08-06 MED ORDER — CEPHALEXIN 500 MG PO CAPS
500.0000 mg | ORAL_CAPSULE | Freq: Four times a day (QID) | ORAL | 0 refills | Status: DC
  Filled 2022-08-06: qty 28, 7d supply, fill #0

## 2022-08-06 NOTE — ED Triage Notes (Signed)
Increased urgency and frequency with burning urination with right lower back pain.  Also complains with malaise and nausea.

## 2022-08-06 NOTE — ED Provider Notes (Signed)
[facility] EMERGENCY DEPT Provider Note   CSN: [id number] Arrival date & time: 08/06/22  7341     History  Chief Complaint  Patient presents with   Urinary Frequency    Courtney Parks is a 61 y.o. female.  Patient yesterday did not feel quite well appetite was down but nothing specific.  Today has urinary frequency urgency burning and bladder cramping.  Also has right lower back pain player.  Patient has a history of kidney stones as well patient also complains of nausea and malaise but no vomiting no fevers.  Past medical history significant for hypertension hyperlipidemia diabetes currently has wounds to her right foot that are being addressed in the trying to get them healed.  No significant worsening of that.  Patient is a former smoker quit in 1985.       Home Medications Prior to Admission medications   Medication Sig Start Date End Date Taking? Authorizing Provider  acetaminophen (TYLENOL) 500 MG tablet Take 500 mg by mouth every 6 (six) hours as needed for moderate pain.    [provider]  aspirin EC 81 MG tablet Take 81 mg by mouth daily. Swallow whole.    [provider]  Continuous Blood Gluc Sensor (FREESTYLE LIBRE 2 SENSOR) MISC 1 each by Does not apply route PRO. 04/05/22   Amin, Jeanella Flattery, MD  Dulaglutide (TRULICITY) 1.5 PF/7.9KW SOPN Inject 1.5 mg into the skin once a week.    [provider]  ibuprofen (ADVIL) 200 MG tablet Take 200-800 mg by mouth every 6 (six) hours as needed for moderate pain.    [provider]  insulin glargine, 1 Unit Dial, (TOUJEO SOLOSTAR) 300 UNIT/ML Solostar Pen Inject 42 Units into the skin daily in the afternoon. 04/05/22   Amin, Jeanella Flattery, MD  Insulin Pen Needle (PEN NEEDLES 3/16") 31G X 5 MM MISC 100 each by Does not apply route 4 (four) times daily. 04/05/22   Damita Lack, MD  Magnesium 100 MG CAPS Take by mouth.    [provider]  Multiple Vitamin  (MULTIVITAMIN) capsule Take 1 capsule by mouth every other day.    [provider]  Probiotic Product (PROBIOTIC BLEND PO) Take by mouth.    [provider]  senna-docusate (SENOKOT-S) 8.6-50 MG tablet Take 1 tablet by mouth at bedtime as needed for moderate constipation. 04/05/22   Amin, Jeanella Flattery, MD  Turmeric 500 MG CAPS Take by mouth.    [provider]  vitamin C (ASCORBIC ACID) 250 MG tablet Take 250 mg by mouth daily.    [provider]  zinc gluconate 50 MG tablet Take 50 mg by mouth daily.    [provider]      Allergies    Codeine, Demerol, Other, and Shellfish allergy    Review of Systems   Review of Systems  Constitutional:  Negative for chills and fever.  HENT:  Negative for ear pain and sore throat.   Eyes:  Negative for pain and visual disturbance.  Respiratory:  Negative for cough and shortness of breath.   Cardiovascular:  Negative for chest pain and palpitations.  Gastrointestinal:  Positive for nausea. Negative for abdominal pain and vomiting.  Genitourinary:  Positive for dysuria and frequency. Negative for hematuria.  Musculoskeletal:  Positive for back pain. Negative for arthralgias.  Skin:  Positive for wound. Negative for color change and rash.  Neurological:  Negative for seizures and syncope.  All other systems reviewed and are  negative.   Physical Exam Updated Vital Signs BP 116/66   Pulse (!) 107   Temp 98.3 F (36.8 C) (Oral)   Resp 20   Ht 1.702 m ('5\' 7"'$ )   Wt 80.7 kg   SpO2 98%   BMI 27.88 kg/m  Physical Exam Vitals and nursing note reviewed.  Constitutional:      General: She is not in acute distress.    Appearance: Normal appearance. She is well-developed.  HENT:     Head: Normocephalic and atraumatic.  Eyes:     Extraocular Movements: Extraocular movements intact.     Conjunctiva/sclera: Conjunctivae normal.     Pupils: Pupils are equal, round, and reactive to light.  Cardiovascular:      Rate and Rhythm: Normal rate and regular rhythm.     Heart sounds: No murmur heard. Pulmonary:     Effort: Pulmonary effort is normal. No respiratory distress.     Breath sounds: Normal breath sounds.  Abdominal:     Palpations: Abdomen is soft.     Tenderness: There is no abdominal tenderness. There is no guarding.  Musculoskeletal:        General: No swelling.     Cervical back: Normal range of motion and neck supple.     Comments: Right foot dressed.  Patient showed me pictures of it has wounds to the sole of the foot.  And some skin peeling.  No deep ulcers currently  Skin:    General: Skin is warm and dry.     Capillary Refill: Capillary refill takes less than 2 seconds.  Neurological:     General: No focal deficit present.     Mental Status: She is alert and oriented to person, place, and time.     Cranial Nerves: No cranial nerve deficit.     Sensory: No sensory deficit.  Psychiatric:        Mood and Affect: Mood normal.     ED Results / Procedures / Treatments   Labs (all labs ordered are listed, but only abnormal results are displayed) Labs Reviewed  URINALYSIS, ROUTINE W REFLEX MICROSCOPIC - Abnormal; Notable for the following components:      Result Value   APPearance HAZY (*)    Hgb urine dipstick TRACE (*)    Ketones, ur 15 (*)    Protein, ur 30 (*)    Leukocytes,Ua LARGE (*)    WBC, UA >50 (*)    Bacteria, UA FEW (*)    Non Squamous Epithelial 0-5 (*)    All other components within normal limits  CBC WITH DIFFERENTIAL/PLATELET - Abnormal; Notable for the following components:   WBC 15.5 (*)    RBC 5.29 (*)    Neutro Abs 13.1 (*)    Monocytes Absolute 1.1 (*)    All other components within normal limits  BASIC METABOLIC PANEL - Abnormal; Notable for the following components:   Chloride 97 (*)    Glucose, Bld 198 (*)    All other components within normal limits  CBG MONITORING, ED - Abnormal; Notable for the following components:   Glucose-Capillary 180  (*)    All other components within normal limits  URINE CULTURE    EKG None  Radiology CT RENAL STONE STUDY  Result Date: 08/06/2022 CLINICAL DATA:  Right back pain EXAM: CT ABDOMEN AND PELVIS WITHOUT CONTRAST TECHNIQUE: Multidetector CT imaging of the abdomen and pelvis was performed following the standard protocol without IV contrast. RADIATION DOSE REDUCTION: This exam was performed  according to the departmental dose-optimization program which includes automated exposure control, adjustment of the mA and/or kV according to patient size and/or use of iterative reconstruction technique. COMPARISON:  CT abdomen and pelvis dated March 29, 2022 FINDINGS: Lower chest: Trace pericardial effusion.  No acute abnormality. Hepatobiliary: No focal liver abnormality is seen. Status post cholecystectomy. No biliary dilatation. Pancreas: Unremarkable. No pancreatic ductal dilatation or surrounding inflammatory changes. Spleen: Normal in size without focal abnormality. Adrenals/Urinary Tract: Bilateral adrenal glands are unremarkable. No hydronephrosis. Left-greater-than-right punctate nonobstructing stones. Low-attenuation lesion of the mid region of the left kidney, likely simple cysts with no specific follow-up imaging recommended. Bladder is unremarkable. Stomach/Bowel: Stomach is within normal limits. Diverticulosis. Normal appendix. No evidence of bowel wall thickening, distention, or inflammatory changes. Vascular/Lymphatic: Aortic atherosclerosis. No enlarged abdominal or pelvic lymph nodes. Reproductive: Uterus and bilateral adnexa are unremarkable. Other: Small fat containing left inguinal hernia. No abdominopelvic ascites Musculoskeletal: No acute or significant osseous findings. IMPRESSION: 1. No acute findings in the abdomen or pelvis. 2. Left-greater-than-right punctate nonobstructing renal stones. 3. Aortic Atherosclerosis (ICD10-I70.0). Electronically Signed   By: Yetta Glassman M.D.   On: 08/06/2022  08:24    Procedures Procedures    Medications Ordered in ED Medications  0.9 %  sodium chloride infusion ( Intravenous New Bag/Given 08/06/22 0816)  cefTRIAXone (ROCEPHIN) 1 g in sodium chloride 0.9 % 100 mL IVPB (1 g Intravenous New Bag/Given 08/06/22 0815)  phenazopyridine (PYRIDIUM) tablet 200 mg (200 mg Oral Given 08/06/22 5027)    ED Course/ Medical Decision Making/ A&P                           Medical Decision Making Amount and/or Complexity of Data Reviewed Labs: ordered. Radiology: ordered.  Risk Prescription drug management.   Urinalysis suggestive of urinary tract infection.  Culture sent.  Treated here with IV Rocephin since you are having the back pain while we waited for the CT renal studies.  Does show stones in the kidneys but no hydronephrosis no evidence of pyelonephritis no obstructing stones.  Take Keflex as directed at home.  Take the Pyridium as directed.  Would expect improvement over the next couple days return for any new or worse symptoms.  Patient nontoxic does have a leukocytosis but not meeting sepsis criteria.  Renal functions normal blood sugar is elevated at 198 electrolytes normal.  Urinalysis negative nitrite but large amount of leukocytes greater than 50 white blood cells.  Not a lot of bacteria.  As stated urine sent for culture.  Final Clinical Impression(s) / ED Diagnoses Final diagnoses:  Acute cystitis without hematuria  Hyperglycemia    Rx / DC Orders ED Discharge Orders     None         Fredia Sorrow, MD 08/06/22 [phone]

## 2022-08-06 NOTE — Discharge Instructions (Signed)
Urine culture is pending.  Take the Keflex as directed for the next 7 days.  Take the Pyridium as needed.  Follow-up with your doctors as indicated.  Would expect improvement over the next couple days.  Return for any new or worse symptoms.  Renal CT without evidence of any obstructing renal stones or any evidence of pyelonephritis.

## 2022-08-07 ENCOUNTER — Other Ambulatory Visit: Payer: Self-pay

## 2022-08-07 ENCOUNTER — Emergency Department (HOSPITAL_COMMUNITY)
Admission: EM | Admit: 2022-08-07 | Discharge: 2022-08-07 | Discharge status: Against Medical Advice | Payer: 59 | Source: Home / Self Care

## 2022-08-07 ENCOUNTER — Emergency Department (HOSPITAL_BASED_OUTPATIENT_CLINIC_OR_DEPARTMENT_OTHER)
Admission: EM | Admit: 2022-08-07 | Discharge: 2022-08-07 | Disposition: A | Discharge status: Home / Self Care | Payer: 59 | Source: Home / Self Care | Attending: Emergency Medicine | Admitting: Emergency Medicine

## 2022-08-07 DIAGNOSIS — R1110 Vomiting, unspecified: Secondary | ICD-10-CM | POA: Insufficient documentation

## 2022-08-07 DIAGNOSIS — N3000 Acute cystitis without hematuria: Secondary | ICD-10-CM | POA: Insufficient documentation

## 2022-08-07 DIAGNOSIS — Z79899 Other long term (current) drug therapy: Secondary | ICD-10-CM | POA: Insufficient documentation

## 2022-08-07 DIAGNOSIS — R531 Weakness: Secondary | ICD-10-CM | POA: Insufficient documentation

## 2022-08-07 DIAGNOSIS — Z5321 Procedure and treatment not carried out due to patient leaving prior to being seen by health care provider: Secondary | ICD-10-CM | POA: Insufficient documentation

## 2022-08-07 DIAGNOSIS — D72829 Elevated white blood cell count, unspecified: Secondary | ICD-10-CM | POA: Insufficient documentation

## 2022-08-07 DIAGNOSIS — I10 Essential (primary) hypertension: Secondary | ICD-10-CM | POA: Insufficient documentation

## 2022-08-07 DIAGNOSIS — Z7982 Long term (current) use of aspirin: Secondary | ICD-10-CM | POA: Insufficient documentation

## 2022-08-07 DIAGNOSIS — Z794 Long term (current) use of insulin: Secondary | ICD-10-CM | POA: Insufficient documentation

## 2022-08-07 DIAGNOSIS — R112 Nausea with vomiting, unspecified: Secondary | ICD-10-CM | POA: Insufficient documentation

## 2022-08-07 DIAGNOSIS — E1165 Type 2 diabetes mellitus with hyperglycemia: Secondary | ICD-10-CM | POA: Insufficient documentation

## 2022-08-07 LAB — CBC
HCT: 42.2 % (ref 36.0–46.0)
Hemoglobin: 13.8 g/dL (ref 12.0–15.0)
MCH: 27.8 pg (ref 26.0–34.0)
MCHC: 32.7 g/dL (ref 30.0–36.0)
MCV: 85.1 fL (ref 80.0–100.0)
Platelets: 158 10*3/uL (ref 150–400)
RBC: 4.96 MIL/uL (ref 3.87–5.11)
RDW: 14 % (ref 11.5–15.5)
WBC: 16.6 10*3/uL — ABNORMAL HIGH (ref 4.0–10.5)
nRBC: 0 % (ref 0.0–0.2)

## 2022-08-07 LAB — COMPREHENSIVE METABOLIC PANEL
ALT: 29 U/L (ref 0–44)
AST: 31 U/L (ref 15–41)
Albumin: 3.5 g/dL (ref 3.5–5.0)
Alkaline Phosphatase: 61 U/L (ref 38–126)
Anion gap: 15 (ref 5–15)
BUN: 16 mg/dL (ref 8–23)
CO2: 19 mmol/L — ABNORMAL LOW (ref 22–32)
Calcium: 9 mg/dL (ref 8.9–10.3)
Chloride: 100 mmol/L (ref 98–111)
Creatinine, Ser: 0.6 mg/dL (ref 0.44–1.00)
GFR, Estimated: >60 mL/min (ref >60)
Glucose, Bld: 267 mg/dL — ABNORMAL HIGH (ref 70–99)
Potassium: 3.6 mmol/L (ref 3.5–5.1)
Sodium: 134 mmol/L — ABNORMAL LOW (ref 135–145)
Total Bilirubin: 1.5 mg/dL — ABNORMAL HIGH (ref 0.3–1.2)
Total Protein: 6.5 g/dL (ref 6.5–8.1)

## 2022-08-07 LAB — LIPASE, BLOOD: Lipase: 27 U/L (ref 11–51)

## 2022-08-07 LAB — LACTIC ACID, PLASMA: Lactic Acid, Venous: 1.8 mmol/L (ref 0.5–1.9)

## 2022-08-07 MED ORDER — ONDANSETRON 4 MG PO TBDP: 4.0000 mg | ORAL_TABLET | Freq: Three times a day (TID) | ORAL | 0 refills | Status: AC | PRN

## 2022-08-07 MED ORDER — ONDANSETRON HCL 4 MG/2ML IJ SOLN
4.0000 mg | Freq: Once | INTRAMUSCULAR | Status: AC
  Administered 2022-08-07: 4 mg via INTRAVENOUS
  Filled 2022-08-07: qty 2

## 2022-08-07 MED ORDER — SODIUM CHLORIDE 0.9 % IV SOLN
1.0000 g | Freq: Once | INTRAVENOUS | Status: AC
  Administered 2022-08-07: 1 g via INTRAVENOUS
  Filled 2022-08-07: qty 10

## 2022-08-07 MED ORDER — SODIUM CHLORIDE 0.9 % IV BOLUS
1000.0000 mL | Freq: Once | INTRAVENOUS | Status: AC
  Administered 2022-08-07: 1000 mL via INTRAVENOUS

## 2022-08-07 NOTE — ED Provider Notes (Signed)
[facility] EMERGENCY DEPT Provider Note   CSN: [id number] Arrival date & time: 08/07/22  0443     History  Chief Complaint  Patient presents with   Urinary Tract Infection    Courtney Parks is a 61 y.o. female.  HPI     This is a 61 year old female who presents with nausea and not feeling well.  She was seen and evaluated yesterday morning and diagnosed with a UTI.  She was discharged on Keflex, Pyridium, Robaxin.  She states that she went home and slept most of the day.  However since yesterday evening she has been having emesis every time she tries to eat something.  She was able to take 3 doses of antibiotics.  She has not had any fevers.  No significant back pain.  No ongoing dysuria.  Of note, patient has had recent significant problems with a right diabetic foot wound.  Patient states that overall she feels like it is healing well and she sees podiatry regularly.  Patient was seen yesterday morning for dysuria.  Urinalysis notable for significant white cells and bacteria.  White blood cell count 15.  Patient initially presented tonight at [facility].  Labs were largely stable.  Persistent leukocytosis.  Patient also had a CT scan yesterday that was largely unremarkable and without kidney stone.  Home Medications Prior to Admission medications   Medication Sig Start Date End Date Taking? Authorizing Provider  ondansetron (ZOFRAN-ODT) 4 MG disintegrating tablet Take 1 tablet (4 mg total) by mouth every 8 (eight) hours as needed for nausea or vomiting. 08/07/22  Yes Indiah Heyden, Barbette Hair, MD  acetaminophen (TYLENOL) 500 MG tablet Take 500 mg by mouth every 6 (six) hours as needed for moderate pain.    [provider]  aspirin EC 81 MG tablet Take 81 mg by mouth daily. Swallow whole.    [provider]  cephALEXin (KEFLEX) 500 MG capsule Take 1 capsule (500 mg total) by mouth 4 (four) times daily. 08/06/22   Fredia Sorrow, MD  Continuous  Blood Gluc Sensor (FREESTYLE LIBRE 2 SENSOR) MISC 1 each by Does not apply route PRO. 04/05/22   Amin, Jeanella Flattery, MD  Dulaglutide (TRULICITY) 1.5 VQ/0.0QQ SOPN Inject 1.5 mg into the skin once a week.    [provider]  ibuprofen (ADVIL) 200 MG tablet Take 200-800 mg by mouth every 6 (six) hours as needed for moderate pain.    [provider]  insulin glargine, 1 Unit Dial, (TOUJEO SOLOSTAR) 300 UNIT/ML Solostar Pen Inject 42 Units into the skin daily in the afternoon. 04/05/22   Amin, Jeanella Flattery, MD  Insulin Pen Needle (PEN NEEDLES 3/16") 31G X 5 MM MISC 100 each by Does not apply route 4 (four) times daily. 04/05/22   Damita Lack, MD  Magnesium 100 MG CAPS Take by mouth.    [provider]  Multiple Vitamin (MULTIVITAMIN) capsule Take 1 capsule by mouth every other day.    [provider]  phenazopyridine (PYRIDIUM) 200 MG tablet Take 1 tablet (200 mg total) by mouth 3 (three) times daily. 08/06/22   Fredia Sorrow, MD  Probiotic Product (PROBIOTIC BLEND PO) Take by mouth.    [provider]  senna-docusate (SENOKOT-S) 8.6-50 MG tablet Take 1 tablet by mouth at bedtime as needed for moderate constipation. 04/05/22   Amin, Jeanella Flattery, MD  Turmeric 500 MG CAPS Take by mouth.    [provider]  vitamin C (ASCORBIC ACID) 250 MG tablet Take  250 mg by mouth daily.    [provider]  zinc gluconate 50 MG tablet Take 50 mg by mouth daily.    [provider]      Allergies    Codeine, Demerol, Other, and Shellfish allergy    Review of Systems   Review of Systems  Constitutional:  Negative for fever.  Respiratory:  Negative for shortness of breath.   Cardiovascular:  Negative for chest pain.  Genitourinary:  Positive for dysuria.  All other systems reviewed and are negative.   Physical Exam Updated Vital Signs BP 117/79   Pulse (!) 110   Temp 97.8 F (36.6 C) (Oral)   Resp 18   Ht 1.702 m ('5\' 7"'$ )   Wt 81.2 kg    SpO2 98%   BMI 28.04 kg/m  Physical Exam Vitals and nursing note reviewed.  Constitutional:      Appearance: She is well-developed. She is not ill-appearing.  HENT:     Head: Normocephalic and atraumatic.  Eyes:     Pupils: Pupils are equal, round, and reactive to light.  Cardiovascular:     Rate and Rhythm: Regular rhythm. Tachycardia present.     Heart sounds: Normal heart sounds.  Pulmonary:     Effort: Pulmonary effort is normal. No respiratory distress.     Breath sounds: No wheezing.  Abdominal:     General: Bowel sounds are normal.     Palpations: Abdomen is soft.     Tenderness: There is no right CVA tenderness or left CVA tenderness.  Musculoskeletal:     Cervical back: Neck supple.  Skin:    General: Skin is warm and dry.  Neurological:     Mental Status: She is alert and oriented to person, place, and time.  Psychiatric:        Mood and Affect: Mood normal.     ED Results / Procedures / Treatments   Labs (all labs ordered are listed, but only abnormal results are displayed) Labs Reviewed  CULTURE, BLOOD (ROUTINE X 2)  CULTURE, BLOOD (ROUTINE X 2)  LACTIC ACID, PLASMA    EKG None  Radiology CT RENAL STONE STUDY  Result Date: 08/06/2022 CLINICAL DATA:  Right back pain EXAM: CT ABDOMEN AND PELVIS WITHOUT CONTRAST TECHNIQUE: Multidetector CT imaging of the abdomen and pelvis was performed following the standard protocol without IV contrast. RADIATION DOSE REDUCTION: This exam was performed according to the departmental dose-optimization program which includes automated exposure control, adjustment of the mA and/or kV according to patient size and/or use of iterative reconstruction technique. COMPARISON:  CT abdomen and pelvis dated March 29, 2022 FINDINGS: Lower chest: Trace pericardial effusion.  No acute abnormality. Hepatobiliary: No focal liver abnormality is seen. Status post cholecystectomy. No biliary dilatation. Pancreas: Unremarkable. No pancreatic  ductal dilatation or surrounding inflammatory changes. Spleen: Normal in size without focal abnormality. Adrenals/Urinary Tract: Bilateral adrenal glands are unremarkable. No hydronephrosis. Left-greater-than-right punctate nonobstructing stones. Low-attenuation lesion of the mid region of the left kidney, likely simple cysts with no specific follow-up imaging recommended. Bladder is unremarkable. Stomach/Bowel: Stomach is within normal limits. Diverticulosis. Normal appendix. No evidence of bowel wall thickening, distention, or inflammatory changes. Vascular/Lymphatic: Aortic atherosclerosis. No enlarged abdominal or pelvic lymph nodes. Reproductive: Uterus and bilateral adnexa are unremarkable. Other: Small fat containing left inguinal hernia. No abdominopelvic ascites Musculoskeletal: No acute or significant osseous findings. IMPRESSION: 1. No acute findings in the abdomen or pelvis. 2. Left-greater-than-right punctate nonobstructing renal stones. 3. Aortic Atherosclerosis (ICD10-I70.0). Electronically  Signed   By: Yetta Glassman M.D.   On: 08/06/2022 08:24    Procedures Procedures    Medications Ordered in ED Medications  sodium chloride 0.9 % bolus 1,000 mL (1,000 mLs Intravenous New Bag/Given 08/07/22 0536)  ondansetron (ZOFRAN) injection 4 mg (4 mg Intravenous Given 08/07/22 0533)  cefTRIAXone (ROCEPHIN) 1 g in sodium chloride 0.9 % 100 mL IVPB (0 g Intravenous Stopped 08/07/22 0556)    ED Course/ Medical Decision Making/ A&P                           Medical Decision Making Amount and/or Complexity of Data Reviewed Labs: ordered.  Risk Prescription drug management.   This patient presents to the ED for concern of nausea and vomiting, this involves an extensive number of treatment options, and is a complaint that carries with it a high risk of complications and morbidity.  I considered the following differential and admission for this acute, potentially life threatening condition.  The  differential diagnosis includes progression to pyelonephritis, viral illness, medication reaction  MDM:    This is a 61 year old female who presents with nausea and vomiting.  Onset of symptoms last night.  Currently being treated for UTI.  Has not had fevers.  Denies flank pain.  She had a CT scan yesterday that did not show any evidence of kidney stones or Pyelo.  Patient was given fluids and nausea medication.  Labs reviewed from [facility].  Stable leukocytosis.  No significant metabolic derangements.  Blood sugar elevated to 261 without an anion gap.  Patient does not have any significant prior available urine cultures.  On recheck, she states she feels much better.  She was able to tolerate foods without difficulty.  Recommend she continue her Keflex at home until urine cultures speciate.  We will give a prescription for Zofran.  (Labs, imaging, consults)  Labs: I Ordered, and personally interpreted labs.  The pertinent results include: CBC, CMP, lipase, lactate  Imaging Studies ordered: I ordered imaging studies including none I independently visualized and interpreted imaging. I agree with the radiologist interpretation  Additional history obtained from chart review.  External records from outside source obtained and reviewed including previous lab work  Cardiac Monitoring: The patient was maintained on a cardiac monitor.  I personally viewed and interpreted the cardiac monitored which showed an underlying rhythm of: Sinus rhythm  Reevaluation: After the interventions noted above, I reevaluated the patient and found that they have :improved  Social Determinants of Health:  lives independently  Disposition: Discharge  Co morbidities that complicate the patient evaluation  Past Medical History:  Diagnosis Date   Anxiety    Depression    Diabetes mellitus, type II ([place])    Hyperlipidemia    Hypertension    Kidney stones      Medicines Meds ordered this encounter   Medications   sodium chloride 0.9 % bolus 1,000 mL   ondansetron (ZOFRAN) injection 4 mg   cefTRIAXone (ROCEPHIN) 1 g in sodium chloride 0.9 % 100 mL IVPB    Order Specific Question:   Antibiotic Indication:    Answer:   UTI   ondansetron (ZOFRAN-ODT) 4 MG disintegrating tablet    Sig: Take 1 tablet (4 mg total) by mouth every 8 (eight) hours as needed for nausea or vomiting.    Dispense:  20 tablet    Refill:  0    I have reviewed the patients home medicines and  have made adjustments as needed  Problem List / ED Course: Problem List Items Addressed This Visit   None Visit Diagnoses     Nausea and vomiting, unspecified vomiting type    -  Primary   Acute cystitis without hematuria                       Final Clinical Impression(s) / ED Diagnoses Final diagnoses:  Nausea and vomiting, unspecified vomiting type  Acute cystitis without hematuria    Rx / DC Orders ED Discharge Orders          Ordered    ondansetron (ZOFRAN-ODT) 4 MG disintegrating tablet  Every 8 hours PRN        08/07/22 0644              Merryl Hacker, MD 08/07/22 [phone]

## 2022-08-07 NOTE — ED Triage Notes (Signed)
Pt arrived via [facility] from home. She was not feeling well, diagnosed with UTI, filled abx rx. Since going home, she has had weakness and vomiting. En route, 106/60, hr 114, cbg 257, 97% ra. IV established in the right hand, 4 zofran, 479m IVF fluids given.

## 2022-08-07 NOTE — ED Notes (Signed)
Pt stating she is leaving going to [facility]. IV removed

## 2022-08-07 NOTE — Discharge Instructions (Signed)
You were seen today for nausea and vomiting.  Your work-up remains reassuring.  Your labs today are stable from yesterday.  Your lactate is normal.  You were given an additional dose of IV antibiotic for your UTI.  Continue Keflex at home.  You will be given a prescription for Zofran.  Take as needed for nausea.

## 2022-08-07 NOTE — ED Triage Notes (Signed)
Pt reports since she went home from the ED, she has had vomiting and not able to keep anything down.

## 2022-08-07 NOTE — ED Triage Notes (Signed)
Pt states that she was seen here yesterday and diagnosed with UTI and prescribed medication. Pt states that she started vomiting last night and unable to keep fluids/food down. She came back because she feels like she needs IV fluids.

## 2022-08-07 NOTE — ED Notes (Signed)
Pt given specimen cup and made aware of need for urine

## 2022-08-08 LAB — URINE CULTURE: Culture: >=100000 — AB

## 2022-08-09 ENCOUNTER — Encounter (HOSPITAL_BASED_OUTPATIENT_CLINIC_OR_DEPARTMENT_OTHER): Payer: Self-pay | Admitting: Emergency Medicine

## 2022-08-09 ENCOUNTER — Emergency Department (HOSPITAL_BASED_OUTPATIENT_CLINIC_OR_DEPARTMENT_OTHER): Payer: 59

## 2022-08-09 ENCOUNTER — Telehealth (HOSPITAL_BASED_OUTPATIENT_CLINIC_OR_DEPARTMENT_OTHER): Payer: Self-pay | Admitting: *Deleted

## 2022-08-09 ENCOUNTER — Other Ambulatory Visit: Payer: Self-pay

## 2022-08-09 ENCOUNTER — Inpatient Hospital Stay (HOSPITAL_BASED_OUTPATIENT_CLINIC_OR_DEPARTMENT_OTHER)
Admission: EM | Admit: 2022-08-09 | Discharge: 2022-08-15 | DRG: 689 | Disposition: A | Discharge status: Home / Self Care | Payer: 59 | Attending: Family Medicine | Admitting: Family Medicine

## 2022-08-09 ENCOUNTER — Inpatient Hospital Stay (HOSPITAL_COMMUNITY): Payer: 59

## 2022-08-09 ENCOUNTER — Ambulatory Visit: Payer: BC Managed Care – PPO | Admitting: Podiatry

## 2022-08-09 ENCOUNTER — Encounter (HOSPITAL_COMMUNITY): Payer: Self-pay

## 2022-08-09 DIAGNOSIS — I10 Essential (primary) hypertension: Secondary | ICD-10-CM | POA: Diagnosis present

## 2022-08-09 DIAGNOSIS — R109 Unspecified abdominal pain: Secondary | ICD-10-CM | POA: Diagnosis not present

## 2022-08-09 DIAGNOSIS — B961 Klebsiella pneumoniae [K. pneumoniae] as the cause of diseases classified elsewhere: Secondary | ICD-10-CM | POA: Diagnosis present

## 2022-08-09 DIAGNOSIS — Z7982 Long term (current) use of aspirin: Secondary | ICD-10-CM | POA: Diagnosis not present

## 2022-08-09 DIAGNOSIS — J1282 Pneumonia due to coronavirus disease 2019: Secondary | ICD-10-CM | POA: Diagnosis present

## 2022-08-09 DIAGNOSIS — N281 Cyst of kidney, acquired: Secondary | ICD-10-CM | POA: Diagnosis present

## 2022-08-09 DIAGNOSIS — E785 Hyperlipidemia, unspecified: Secondary | ICD-10-CM | POA: Diagnosis present

## 2022-08-09 DIAGNOSIS — Z79899 Other long term (current) drug therapy: Secondary | ICD-10-CM

## 2022-08-09 DIAGNOSIS — N3000 Acute cystitis without hematuria: Secondary | ICD-10-CM | POA: Diagnosis not present

## 2022-08-09 DIAGNOSIS — N151 Renal and perinephric abscess: Principal | ICD-10-CM | POA: Diagnosis present

## 2022-08-09 DIAGNOSIS — F32A Depression, unspecified: Secondary | ICD-10-CM | POA: Diagnosis present

## 2022-08-09 DIAGNOSIS — E663 Overweight: Secondary | ICD-10-CM | POA: Diagnosis present

## 2022-08-09 DIAGNOSIS — Z87891 Personal history of nicotine dependence: Secondary | ICD-10-CM | POA: Diagnosis not present

## 2022-08-09 DIAGNOSIS — R160 Hepatomegaly, not elsewhere classified: Secondary | ICD-10-CM | POA: Diagnosis not present

## 2022-08-09 DIAGNOSIS — Z9049 Acquired absence of other specified parts of digestive tract: Secondary | ICD-10-CM

## 2022-08-09 DIAGNOSIS — E1065 Type 1 diabetes mellitus with hyperglycemia: Secondary | ICD-10-CM | POA: Diagnosis present

## 2022-08-09 DIAGNOSIS — Z8249 Family history of ischemic heart disease and other diseases of the circulatory system: Secondary | ICD-10-CM

## 2022-08-09 DIAGNOSIS — R0902 Hypoxemia: Secondary | ICD-10-CM | POA: Diagnosis not present

## 2022-08-09 DIAGNOSIS — R1110 Vomiting, unspecified: Secondary | ICD-10-CM | POA: Diagnosis not present

## 2022-08-09 DIAGNOSIS — Z885 Allergy status to narcotic agent status: Secondary | ICD-10-CM | POA: Diagnosis not present

## 2022-08-09 DIAGNOSIS — U071 COVID-19: Secondary | ICD-10-CM | POA: Diagnosis present

## 2022-08-09 DIAGNOSIS — Z6828 Body mass index (BMI) 28.0-28.9, adult: Secondary | ICD-10-CM

## 2022-08-09 DIAGNOSIS — Z794 Long term (current) use of insulin: Secondary | ICD-10-CM

## 2022-08-09 DIAGNOSIS — E861 Hypovolemia: Secondary | ICD-10-CM | POA: Diagnosis present

## 2022-08-09 DIAGNOSIS — E119 Type 2 diabetes mellitus without complications: Secondary | ICD-10-CM

## 2022-08-09 DIAGNOSIS — I700 Atherosclerosis of aorta: Secondary | ICD-10-CM | POA: Diagnosis present

## 2022-08-09 DIAGNOSIS — M25551 Pain in right hip: Secondary | ICD-10-CM

## 2022-08-09 DIAGNOSIS — I959 Hypotension, unspecified: Secondary | ICD-10-CM | POA: Diagnosis present

## 2022-08-09 DIAGNOSIS — Z91013 Allergy to seafood: Secondary | ICD-10-CM

## 2022-08-09 DIAGNOSIS — R509 Fever, unspecified: Secondary | ICD-10-CM

## 2022-08-09 DIAGNOSIS — N289 Disorder of kidney and ureter, unspecified: Secondary | ICD-10-CM | POA: Diagnosis not present

## 2022-08-09 DIAGNOSIS — F419 Anxiety disorder, unspecified: Secondary | ICD-10-CM | POA: Diagnosis present

## 2022-08-09 DIAGNOSIS — R079 Chest pain, unspecified: Secondary | ICD-10-CM | POA: Diagnosis not present

## 2022-08-09 DIAGNOSIS — R0602 Shortness of breath: Secondary | ICD-10-CM | POA: Diagnosis not present

## 2022-08-09 DIAGNOSIS — R112 Nausea with vomiting, unspecified: Secondary | ICD-10-CM

## 2022-08-09 DIAGNOSIS — N2889 Other specified disorders of kidney and ureter: Secondary | ICD-10-CM | POA: Diagnosis not present

## 2022-08-09 DIAGNOSIS — Z833 Family history of diabetes mellitus: Secondary | ICD-10-CM

## 2022-08-09 DIAGNOSIS — N390 Urinary tract infection, site not specified: Secondary | ICD-10-CM | POA: Diagnosis present

## 2022-08-09 DIAGNOSIS — Z888 Allergy status to other drugs, medicaments and biological substances status: Secondary | ICD-10-CM

## 2022-08-09 DIAGNOSIS — J90 Pleural effusion, not elsewhere classified: Secondary | ICD-10-CM | POA: Diagnosis not present

## 2022-08-09 LAB — CBC WITH DIFFERENTIAL/PLATELET
Abs Immature Granulocytes: 0.03 10*3/uL (ref 0.00–0.07)
Basophils Absolute: 0 10*3/uL (ref 0.0–0.1)
Basophils Relative: 0 %
Eosinophils Absolute: 0 10*3/uL (ref 0.0–0.5)
Eosinophils Relative: 0 %
HCT: 37.1 % (ref 36.0–46.0)
Hemoglobin: 12.2 g/dL (ref 12.0–15.0)
Immature Granulocytes: 0 %
Lymphocytes Relative: 5 %
Lymphs Abs: 0.4 10*3/uL — ABNORMAL LOW (ref 0.7–4.0)
MCH: 28.1 pg (ref 26.0–34.0)
MCHC: 32.9 g/dL (ref 30.0–36.0)
MCV: 85.5 fL (ref 80.0–100.0)
Monocytes Absolute: 0.1 10*3/uL (ref 0.1–1.0)
Monocytes Relative: 2 %
Neutro Abs: 6.9 10*3/uL (ref 1.7–7.7)
Neutrophils Relative %: 93 %
Platelets: 95 10*3/uL — ABNORMAL LOW (ref 150–400)
RBC: 4.34 MIL/uL (ref 3.87–5.11)
RDW: 14.3 % (ref 11.5–15.5)
WBC: 7.5 10*3/uL (ref 4.0–10.5)
nRBC: 0 % (ref 0.0–0.2)

## 2022-08-09 LAB — URINALYSIS, ROUTINE W REFLEX MICROSCOPIC
Bilirubin Urine: NEGATIVE
Glucose, UA: NEGATIVE mg/dL
Hgb urine dipstick: NEGATIVE
Ketones, ur: >80 mg/dL — AB
Nitrite: NEGATIVE
Protein, ur: 30 mg/dL — AB
Specific Gravity, Urine: >1.046 — ABNORMAL HIGH (ref 1.005–1.030)
pH: 6.5 (ref 5.0–8.0)

## 2022-08-09 LAB — I-STAT VENOUS BLOOD GAS, ED
Acid-base deficit: 6 mmol/L — ABNORMAL HIGH (ref 0.0–2.0)
Bicarbonate: 18.9 mmol/L — ABNORMAL LOW (ref 20.0–28.0)
Calcium, Ion: 1.17 mmol/L (ref 1.15–1.40)
HCT: 37 % (ref 36.0–46.0)
Hemoglobin: 12.6 g/dL (ref 12.0–15.0)
O2 Saturation: 54 %
Patient temperature: 102.5
Potassium: 3.5 mmol/L (ref 3.5–5.1)
Sodium: 133 mmol/L — ABNORMAL LOW (ref 135–145)
TCO2: 20 mmol/L — ABNORMAL LOW (ref 22–32)
pCO2, Ven: 38.4 mmHg — ABNORMAL LOW (ref 44–60)
pH, Ven: 7.31 (ref 7.25–7.43)
pO2, Ven: 35 mmHg (ref 32–45)

## 2022-08-09 LAB — FIBRINOGEN: Fibrinogen: >800 mg/dL — ABNORMAL HIGH (ref 210–475)

## 2022-08-09 LAB — BASIC METABOLIC PANEL
Anion gap: 16 — ABNORMAL HIGH (ref 5–15)
BUN: 19 mg/dL (ref 8–23)
CO2: 20 mmol/L — ABNORMAL LOW (ref 22–32)
Calcium: 9 mg/dL (ref 8.9–10.3)
Chloride: 97 mmol/L — ABNORMAL LOW (ref 98–111)
Creatinine, Ser: 0.58 mg/dL (ref 0.44–1.00)
GFR, Estimated: >60 mL/min (ref >60)
Glucose, Bld: 242 mg/dL — ABNORMAL HIGH (ref 70–99)
Potassium: 3.6 mmol/L (ref 3.5–5.1)
Sodium: 133 mmol/L — ABNORMAL LOW (ref 135–145)

## 2022-08-09 LAB — BRAIN NATRIURETIC PEPTIDE: B Natriuretic Peptide: 224 pg/mL — ABNORMAL HIGH (ref 0.0–100.0)

## 2022-08-09 LAB — C-REACTIVE PROTEIN: CRP: 26.9 mg/dL — ABNORMAL HIGH (ref <1.0)

## 2022-08-09 LAB — RESP PANEL BY RT-PCR (FLU A&B, COVID) ARPGX2
Influenza A by PCR: NEGATIVE
Influenza B by PCR: NEGATIVE
SARS Coronavirus 2 by RT PCR: POSITIVE — AB

## 2022-08-09 LAB — CBG MONITORING, ED
Glucose-Capillary: 220 mg/dL — ABNORMAL HIGH (ref 70–99)
Glucose-Capillary: 196 mg/dL — ABNORMAL HIGH (ref 70–99)

## 2022-08-09 LAB — LACTATE DEHYDROGENASE: LDH: 154 U/L (ref 98–192)

## 2022-08-09 LAB — D-DIMER, QUANTITATIVE: D-Dimer, Quant: 1.71 ug/mL-FEU — ABNORMAL HIGH (ref 0.00–0.50)

## 2022-08-09 LAB — GLUCOSE, CAPILLARY
Glucose-Capillary: 148 mg/dL — ABNORMAL HIGH (ref 70–99)
Glucose-Capillary: 222 mg/dL — ABNORMAL HIGH (ref 70–99)

## 2022-08-09 LAB — PROCALCITONIN: Procalcitonin: 8.54 ng/mL

## 2022-08-09 LAB — FERRITIN: Ferritin: 327 ng/mL — ABNORMAL HIGH (ref 11–307)

## 2022-08-09 LAB — LACTIC ACID, PLASMA: Lactic Acid, Venous: 1.6 mmol/L (ref 0.5–1.9)

## 2022-08-09 MED ORDER — POLYETHYLENE GLYCOL 3350 17 G PO PACK: 17.0000 g | PACK | Freq: Every day | ORAL | Status: DC | PRN

## 2022-08-09 MED ORDER — INSULIN ASPART 100 UNIT/ML IJ SOLN
0.0000 [IU] | Freq: Three times a day (TID) | INTRAMUSCULAR | Status: DC
  Administered 2022-08-09: 3 [IU] via SUBCUTANEOUS

## 2022-08-09 MED ORDER — BISACODYL 5 MG PO TBEC
5.0000 mg | DELAYED_RELEASE_TABLET | Freq: Every day | ORAL | Status: DC | PRN
  Filled 2022-08-09: qty 1

## 2022-08-09 MED ORDER — INSULIN ASPART 100 UNIT/ML IJ SOLN
0.0000 [IU] | Freq: Every day | INTRAMUSCULAR | Status: DC
  Administered 2022-08-09: 2 [IU] via SUBCUTANEOUS
  Administered 2022-08-10: 3 [IU] via SUBCUTANEOUS

## 2022-08-09 MED ORDER — OXYCODONE-ACETAMINOPHEN 5-325 MG PO TABS
1.0000 | ORAL_TABLET | Freq: Once | ORAL | Status: AC
  Administered 2022-08-09: 1 via ORAL
  Filled 2022-08-09: qty 1

## 2022-08-09 MED ORDER — LACTATED RINGERS IV BOLUS
20.0000 mL/kg | Freq: Once | INTRAVENOUS | Status: AC
  Administered 2022-08-09: 1624 mL via INTRAVENOUS

## 2022-08-09 MED ORDER — FENTANYL CITRATE PF 50 MCG/ML IJ SOSY
50.0000 ug | PREFILLED_SYRINGE | Freq: Once | INTRAMUSCULAR | Status: AC
  Administered 2022-08-09: 50 ug via INTRAVENOUS
  Filled 2022-08-09: qty 1

## 2022-08-09 MED ORDER — ACETAMINOPHEN 325 MG PO TABS
650.0000 mg | ORAL_TABLET | Freq: Four times a day (QID) | ORAL | Status: DC | PRN
  Administered 2022-08-10: 650 mg via ORAL
  Filled 2022-08-09 (×3): qty 2

## 2022-08-09 MED ORDER — GUAIFENESIN-DM 100-10 MG/5ML PO SYRP: 10.0000 mL | ORAL_SOLUTION | ORAL | Status: DC | PRN

## 2022-08-09 MED ORDER — ASPIRIN 81 MG PO TBEC
81.0000 mg | DELAYED_RELEASE_TABLET | Freq: Every day | ORAL | Status: DC
  Administered 2022-08-10 – 2022-08-15 (×6): 81 mg via ORAL
  Filled 2022-08-09 (×6): qty 1

## 2022-08-09 MED ORDER — VITAMIN C 500 MG PO TABS
500.0000 mg | ORAL_TABLET | Freq: Every day | ORAL | Status: DC
  Administered 2022-08-09 – 2022-08-15 (×7): 500 mg via ORAL
  Filled 2022-08-09 (×7): qty 1

## 2022-08-09 MED ORDER — SODIUM CHLORIDE 0.9% FLUSH
3.0000 mL | Freq: Two times a day (BID) | INTRAVENOUS | Status: DC
  Administered 2022-08-10 – 2022-08-15 (×9): 3 mL via INTRAVENOUS

## 2022-08-09 MED ORDER — NIRMATRELVIR/RITONAVIR (PAXLOVID)TABLET
3.0000 | ORAL_TABLET | Freq: Two times a day (BID) | ORAL | Status: AC
  Administered 2022-08-09 – 2022-08-14 (×10): 3 via ORAL
  Filled 2022-08-09: qty 30

## 2022-08-09 MED ORDER — ONDANSETRON HCL 4 MG PO TABS
4.0000 mg | ORAL_TABLET | Freq: Four times a day (QID) | ORAL | Status: DC | PRN
  Administered 2022-08-11 – 2022-08-14 (×2): 4 mg via ORAL
  Filled 2022-08-09 (×2): qty 1

## 2022-08-09 MED ORDER — HYDROCOD POLI-CHLORPHE POLI ER 10-8 MG/5ML PO SUER: 5.0000 mL | Freq: Two times a day (BID) | ORAL | Status: DC | PRN

## 2022-08-09 MED ORDER — ZINC SULFATE 220 (50 ZN) MG PO CAPS
220.0000 mg | ORAL_CAPSULE | Freq: Every day | ORAL | Status: DC
  Administered 2022-08-09 – 2022-08-15 (×7): 220 mg via ORAL
  Filled 2022-08-09 (×7): qty 1

## 2022-08-09 MED ORDER — INSULIN GLARGINE-YFGN 100 UNIT/ML ~~LOC~~ SOLN
40.0000 [IU] | Freq: Every day | SUBCUTANEOUS | Status: DC
  Administered 2022-08-09 – 2022-08-14 (×6): 40 [IU] via SUBCUTANEOUS
  Filled 2022-08-09 (×8): qty 0.4

## 2022-08-09 MED ORDER — IOHEXOL 350 MG/ML SOLN
100.0000 mL | Freq: Once | INTRAVENOUS | Status: AC | PRN
  Administered 2022-08-09: 100 mL via INTRAVENOUS

## 2022-08-09 MED ORDER — SODIUM CHLORIDE 0.9 % IV SOLN: INTRAVENOUS | Status: AC

## 2022-08-09 MED ORDER — CEPHALEXIN 500 MG PO CAPS
500.0000 mg | ORAL_CAPSULE | Freq: Four times a day (QID) | ORAL | Status: DC
  Administered 2022-08-09 – 2022-08-10 (×3): 500 mg via ORAL
  Filled 2022-08-09 (×3): qty 1

## 2022-08-09 MED ORDER — ONDANSETRON HCL 4 MG/2ML IJ SOLN
4.0000 mg | Freq: Once | INTRAMUSCULAR | Status: AC
  Administered 2022-08-09: 4 mg via INTRAVENOUS
  Filled 2022-08-09: qty 2

## 2022-08-09 MED ORDER — ALBUTEROL SULFATE HFA 108 (90 BASE) MCG/ACT IN AERS: 2.0000 | INHALATION_SPRAY | RESPIRATORY_TRACT | Status: DC | PRN

## 2022-08-09 MED ORDER — INSULIN ASPART 100 UNIT/ML IJ SOLN: 0.0000 [IU] | Freq: Every day | INTRAMUSCULAR | Status: DC

## 2022-08-09 MED ORDER — DOCUSATE SODIUM 100 MG PO CAPS
100.0000 mg | ORAL_CAPSULE | Freq: Two times a day (BID) | ORAL | Status: DC
  Administered 2022-08-09 – 2022-08-15 (×9): 100 mg via ORAL
  Filled 2022-08-09 (×12): qty 1

## 2022-08-09 MED ORDER — OXYCODONE HCL 5 MG PO TABS
5.0000 mg | ORAL_TABLET | ORAL | Status: DC | PRN
  Administered 2022-08-09 – 2022-08-14 (×7): 5 mg via ORAL
  Filled 2022-08-09 (×7): qty 1

## 2022-08-09 MED ORDER — SODIUM CHLORIDE 0.9 % IV SOLN: INTRAVENOUS | Status: DC | PRN

## 2022-08-09 MED ORDER — METHYLPREDNISOLONE SODIUM SUCC 125 MG IJ SOLR
81.2500 mg | INTRAMUSCULAR | Status: AC
  Administered 2022-08-09 – 2022-08-11 (×3): 81.25 mg via INTRAVENOUS
  Filled 2022-08-09 (×4): qty 2

## 2022-08-09 MED ORDER — DEXAMETHASONE SODIUM PHOSPHATE 10 MG/ML IJ SOLN
6.0000 mg | Freq: Once | INTRAMUSCULAR | Status: AC
  Administered 2022-08-09: 6 mg via INTRAVENOUS
  Filled 2022-08-09: qty 1

## 2022-08-09 MED ORDER — SODIUM CHLORIDE 0.9% FLUSH
3.0000 mL | INTRAVENOUS | Status: DC | PRN
  Administered 2022-08-11: 3 mL via INTRAVENOUS

## 2022-08-09 MED ORDER — IBUPROFEN 800 MG PO TABS
800.0000 mg | ORAL_TABLET | Freq: Once | ORAL | Status: AC
  Administered 2022-08-09: 800 mg via ORAL
  Filled 2022-08-09: qty 1

## 2022-08-09 MED ORDER — GADOBUTROL 1 MMOL/ML IV SOLN
8.0000 mL | Freq: Once | INTRAVENOUS | Status: AC | PRN
  Administered 2022-08-09: 7.5 mL via INTRAVENOUS

## 2022-08-09 MED ORDER — SODIUM CHLORIDE 0.9 % IV SOLN
1.0000 g | Freq: Once | INTRAVENOUS | Status: AC
  Administered 2022-08-09: 1 g via INTRAVENOUS
  Filled 2022-08-09: qty 10

## 2022-08-09 MED ORDER — PREDNISONE 20 MG PO TABS
50.0000 mg | ORAL_TABLET | Freq: Every day | ORAL | Status: DC
  Administered 2022-08-12: 50 mg via ORAL
  Filled 2022-08-09: qty 1

## 2022-08-09 MED ORDER — ACETAMINOPHEN 500 MG PO TABS
1000.0000 mg | ORAL_TABLET | Freq: Once | ORAL | Status: AC
  Administered 2022-08-09: 1000 mg via ORAL
  Filled 2022-08-09: qty 2

## 2022-08-09 MED ORDER — ENOXAPARIN SODIUM 40 MG/0.4ML IJ SOSY
40.0000 mg | PREFILLED_SYRINGE | INTRAMUSCULAR | Status: DC
  Administered 2022-08-10 – 2022-08-14 (×4): 40 mg via SUBCUTANEOUS
  Filled 2022-08-09 (×4): qty 0.4

## 2022-08-09 MED ORDER — INSULIN ASPART 100 UNIT/ML IJ SOLN
0.0000 [IU] | Freq: Three times a day (TID) | INTRAMUSCULAR | Status: DC
  Administered 2022-08-10 (×2): 8 [IU] via SUBCUTANEOUS
  Administered 2022-08-10: 11 [IU] via SUBCUTANEOUS

## 2022-08-09 MED ORDER — ONDANSETRON HCL 4 MG/2ML IJ SOLN
4.0000 mg | Freq: Four times a day (QID) | INTRAMUSCULAR | Status: DC | PRN
  Administered 2022-08-09: 4 mg via INTRAVENOUS
  Filled 2022-08-09: qty 2

## 2022-08-09 MED ORDER — FENTANYL CITRATE PF 50 MCG/ML IJ SOSY
100.0000 ug | PREFILLED_SYRINGE | Freq: Once | INTRAMUSCULAR | Status: AC
  Administered 2022-08-09: 100 ug via INTRAVENOUS
  Filled 2022-08-09: qty 2

## 2022-08-09 MED ORDER — PHENAZOPYRIDINE HCL 200 MG PO TABS
200.0000 mg | ORAL_TABLET | Freq: Three times a day (TID) | ORAL | Status: DC
  Administered 2022-08-09 – 2022-08-11 (×7): 200 mg via ORAL
  Filled 2022-08-09 (×8): qty 1

## 2022-08-09 MED ORDER — FLEET ENEMA 7-19 GM/118ML RE ENEM: 1.0000 | ENEMA | Freq: Once | RECTAL | Status: DC | PRN

## 2022-08-09 NOTE — ED Provider Notes (Signed)
  Physical Exam  BP (!) 80/71   Pulse 87   Temp 99.5 F (37.5 C) (Rectal)   Resp 17   SpO2 99%   Physical Exam  Procedures  .Critical Care  Performed by: Varney Biles, MD Authorized by: Varney Biles, MD   Critical care provider statement:    Critical care time (minutes):  32   Critical care was necessary to treat or prevent imminent or life-threatening deterioration of the following conditions:  Circulatory failure and respiratory failure   Critical care was time spent personally by me on the following activities:  Development of treatment plan with patient or surrogate, discussions with consultants, evaluation of patient's response to treatment, examination of patient, ordering and review of laboratory studies, ordering and review of radiographic studies, ordering and performing treatments and interventions, pulse oximetry, re-evaluation of patient's condition and review of old charts   ED Course / MDM    Medical Decision Making Amount and/or Complexity of Data Reviewed Labs: ordered. Radiology: ordered.  Risk OTC drugs. Prescription drug management. Decision regarding hospitalization.   61 year old patient with history of type 1 diabetes, cholecystectomy, UTI comes in with chief complaint of flank pain.  Patient found to have kidney stone.  She is having chills.  I was advised to assess the patient this morning to see if she needs admission.  Patient has new oxygen requirement.  She is right now resting comfortably.  Still having rigors.  I have ordered IV dexamethasone.  She was complaining of flank pain/back pain.  Oral pain medications ordered.  Patient's BP stable right now.  Nursing staff requested more medicine for her back/hip pain.  I reviewed patient's record 1 more time.  She did not have any x-ray of her hip.  There is some question about pyelonephritis.  We will get a CT renal stone to ensure there is no evidence of perinephric abscess and also make sure  there is no issues with the hip itself.  Additionally, patient's BP is noted at 80/71.  This is an erroneous recording.  Her BP now is in the 120s, there is an isolated low blood pressure that the nursing staff thinks is erroneous.  Patient's overall status has not changed.  She is stable for admission to the bed that was requested originally.         Varney Biles, MD 08/09/22 1422

## 2022-08-09 NOTE — ED Triage Notes (Addendum)
Pt c/o flank pain. Pt seen here for UTI and emesis. Pt pale, tachycardic, and lethargic. Pt's O2 during triage 86% on RA, pt placed on 2lpm [place], O2 increased to 92%.

## 2022-08-09 NOTE — ED Notes (Signed)
Late entry -- Pt has returned from CT -- cardiac monitoring and pulse ox monitoring resumed as well as IVF bolus and IVPB Rocephin - pt remains awake and alert; calm and relaxed - no acute changes noted.

## 2022-08-09 NOTE — ED Notes (Signed)
Patient transported to CT 

## 2022-08-09 NOTE — ED Notes (Signed)
RT obtained VBG results w/the following results. Pt on [place] 2 Lpm w/sats of 96%. MD aware of any critical values. Pt respiratory status is stable w/no distress noted at this time.    Latest Reference Range & Units 08/09/22 05:13  Sample type  VENOUS  pH, Ven 7.25 - 7.43  7.310  pCO2, Ven 44 - 60 mmHg 38.4 (L)  pO2, Ven 32 - 45 mmHg 35  TCO2 22 - 32 mmol/L 20 (L)  Acid-base deficit 0.0 - 2.0 mmol/L 6.0 (H)  Bicarbonate 20.0 - 28.0 mmol/L 18.9 (L)  O2 Saturation % 54  Patient temperature  102.5 F  (L): Data is abnormally low (H): Data is abnormally high

## 2022-08-09 NOTE — ED Provider Notes (Signed)
[facility] Provider Note: Georgena Spurling, MD, FACEP  CSN: [id number] MRN: [id number] ARRIVAL: 08/09/22 at [place]: [place]  Hip Pain   HISTORY OF PRESENT ILLNESS  08/09/22 4:52 AM Courtney Parks is a 61 y.o. female with a history of 1 diabetes and nephrolithiasis.  She was seen on 08/06/2022 for urinary frequency and diagnosed with a urinary tract infection.  She was given IV ceftriaxone in the ED and discharged on Keflex.  Urine culture grew out Klebsiella pneumoniae sensitive to all tested antibiotics except intermediate to nitrofurantoin and negative to ampicillin.  CT scan showed bilateral punctate nephrolithiasis without obstruction and no ureterolithiasis.  She was seen on the next day for nausea and vomiting.  Again she was given IV ceftriaxone in the ED. Blood cultures were obtained which have so far not grown any organisms.  No urinalysis or urine culture was performed on that visit.  She was discharged on Zofran.   She returns with right hip pain which she attributes to an antalgic gait associated with a chronic diabetic wound on her right foot.  She rates this pain as a 10 out of 10.  It is worse with movement of her right hip.  It is not in her right flank and she does not believe it is a kidney stone.  She has had a return of nausea and vomiting since yesterday.  She has general malaise and weakness.  On arrival she was noted to have an oxygen saturation in the 80s and this improved to the mid 90s on supplemental oxygen by nasal cannula.   Past Medical History:  Diagnosis Date   Anxiety    Depression    Diabetes mellitus, type II ([place])    Hyperlipidemia    Hypertension    Kidney stones     Past Surgical History:  Procedure Laterality Date   CHOLECYSTECTOMY  02/28/2012   Procedure: LAPAROSCOPIC CHOLECYSTECTOMY WITH INTRAOPERATIVE CHOLANGIOGRAM;  Surgeon: Imogene Burn. Georgette Dover, MD;  Location: [place];  Service: General;  Laterality: N/A;    I & D EXTREMITY Right 04/03/2022   Procedure: IRRIGATION AND DEBRIDEMENT RIGHT FOOT WITH WOUND VAC;  Surgeon: Felipa Furnace, DPM;  Location: [facility];  Service: Podiatry;  Laterality: Right;   IRRIGATION AND DEBRIDEMENT FOOT Right 03/30/2022   Procedure: IRRIGATION AND DEBRIDEMENT FOOT;  Surgeon: [name], DPM;  Location: [facility];  Service: Podiatry;  Laterality: Right;   ULNAR NERVE REPAIR     URETERAL STENT PLACEMENT      Family History  Problem Relation Age of Onset   Diabetes Mother    Hypertension Mother    Colon cancer Mother    Hypertension Father    ADD / ADHD Maternal Grandmother     Social History   Tobacco Use   Smoking status: Former    Years: 5.00    Types: Cigarettes    Quit date: 12/12/1983    Years since quitting: 38.6   Smokeless tobacco: Never  Substance Use Topics   Alcohol use: No   Drug use: No    Prior to Admission medications   Medication Sig Start Date End Date Taking? Authorizing Provider  acetaminophen (TYLENOL) 500 MG tablet Take 500 mg by mouth every 6 (six) hours as needed for moderate pain.    [provider]  aspirin EC 81 MG tablet Take 81 mg by mouth daily. Swallow whole.    [provider]  cephALEXin (KEFLEX) 500 MG capsule Take  1 capsule (500 mg total) by mouth 4 (four) times daily. 08/06/22   Fredia Sorrow, MD  Continuous Blood Gluc Sensor (FREESTYLE LIBRE 2 SENSOR) MISC 1 each by Does not apply route PRO. 04/05/22   Amin, Jeanella Flattery, MD  Dulaglutide (TRULICITY) 1.5 FI/4.3PI SOPN Inject 1.5 mg into the skin once a week.    [provider]  ibuprofen (ADVIL) 200 MG tablet Take 200-800 mg by mouth every 6 (six) hours as needed for moderate pain.    [provider]  insulin glargine, 1 Unit Dial, (TOUJEO SOLOSTAR) 300 UNIT/ML Solostar Pen Inject 42 Units into the skin daily in the afternoon. 04/05/22   Amin, Jeanella Flattery, MD  Insulin Pen Needle (PEN NEEDLES 3/16") 31G X 5 MM MISC 100 each by Does not apply  route 4 (four) times daily. 04/05/22   Damita Lack, MD  Magnesium 100 MG CAPS Take by mouth.    [provider]  Multiple Vitamin (MULTIVITAMIN) capsule Take 1 capsule by mouth every other day.    [provider]  ondansetron (ZOFRAN-ODT) 4 MG disintegrating tablet Take 1 tablet (4 mg total) by mouth every 8 (eight) hours as needed for nausea or vomiting. 08/07/22   Horton, Barbette Hair, MD  phenazopyridine (PYRIDIUM) 200 MG tablet Take 1 tablet (200 mg total) by mouth 3 (three) times daily. 08/06/22   Fredia Sorrow, MD  Probiotic Product (PROBIOTIC BLEND PO) Take by mouth.    [provider]  senna-docusate (SENOKOT-S) 8.6-50 MG tablet Take 1 tablet by mouth at bedtime as needed for moderate constipation. 04/05/22   Amin, Jeanella Flattery, MD  Turmeric 500 MG CAPS Take by mouth.    [provider]  vitamin C (ASCORBIC ACID) 250 MG tablet Take 250 mg by mouth daily.    [provider]  zinc gluconate 50 MG tablet Take 50 mg by mouth daily.    [provider]    Allergies Codeine, Demerol, Other, and Shellfish allergy   REVIEW OF SYSTEMS  Negative except as noted here or in the History of Present Illness.   PHYSICAL EXAMINATION  Initial Vital Signs Blood pressure 130/62, pulse (!) 117, temperature 98.6 F (37 C), resp. rate (!) 23, SpO2 90 %.  Examination General: Well-developed, well-nourished female in no acute distress; appearance consistent with age of record HENT: normocephalic; atraumatic Eyes: Normal appearance Neck: supple Heart: regular rate and rhythm Lungs: clear to auscultation bilaterally Abdomen: soft; nondistended; nontender; bowel sounds present Extremities: Pain on movement of right hip; chronic appearing deformities of right toes with chronic appearing wounds of right foot:      Neurologic: Awake, alert and oriented; motor function intact in all extremities and symmetric; no facial droop Skin: Warm and  dry Psychiatric: Moaning   RESULTS  Summary of this visit's results, reviewed and interpreted by myself:   EKG Interpretation  Date/Time:  Friday August 09 2022 04:45:37 EST Ventricular Rate:  117 PR Interval:  149 QRS Duration: 84 QT Interval:  303 QTC Calculation: 423 R Axis:   79 Text Interpretation: Sinus tachycardia Rate is fasater Confirmed by Shanon Rosser [phone]) on 08/09/2022 5:27:36 AM       Laboratory Studies: Results for orders placed or performed during the hospital encounter of 08/09/22 (from the past 24 hour(s))  CBG monitoring, ED     Status: Abnormal   Collection Time: 08/09/22  5:04 AM  Result Value Ref Range   Glucose-Capillary 220 (H) 70 - 99 mg/dL  CBC with Differential  Status: Abnormal   Collection Time: 08/09/22  5:06 AM  Result Value Ref Range   WBC 7.5 4.0 - 10.5 K/uL   RBC 4.34 3.87 - 5.11 MIL/uL   Hemoglobin 12.2 12.0 - 15.0 g/dL   HCT 37.1 36.0 - 46.0 %   MCV 85.5 80.0 - 100.0 fL   MCH 28.1 26.0 - 34.0 pg   MCHC 32.9 30.0 - 36.0 g/dL   RDW 14.3 11.5 - 15.5 %   Platelets 95 (L) 150 - 400 K/uL   nRBC 0.0 0.0 - 0.2 %   Neutrophils Relative % 93 %   Neutro Abs 6.9 1.7 - 7.7 K/uL   Lymphocytes Relative 5 %   Lymphs Abs 0.4 (L) 0.7 - 4.0 K/uL   Monocytes Relative 2 %   Monocytes Absolute 0.1 0.1 - 1.0 K/uL   Eosinophils Relative 0 %   Eosinophils Absolute 0.0 0.0 - 0.5 K/uL   Basophils Relative 0 %   Basophils Absolute 0.0 0.0 - 0.1 K/uL   Immature Granulocytes 0 %   Abs Immature Granulocytes 0.03 0.00 - 0.07 K/uL  Basic metabolic panel     Status: Abnormal   Collection Time: 08/09/22  5:06 AM  Result Value Ref Range   Sodium 133 (L) 135 - 145 mmol/L   Potassium 3.6 3.5 - 5.1 mmol/L   Chloride 97 (L) 98 - 111 mmol/L   CO2 20 (L) 22 - 32 mmol/L   Glucose, Bld 242 (H) 70 - 99 mg/dL   BUN 19 8 - 23 mg/dL   Creatinine, Ser 0.58 0.44 - 1.00 mg/dL   Calcium 9.0 8.9 - 10.3 mg/dL   GFR, Estimated >60 >60 mL/min   Anion gap 16 (H) 5 -  15  Lactic acid, plasma     Status: None   Collection Time: 08/09/22  5:06 AM  Result Value Ref Range   Lactic Acid, Venous 1.6 0.5 - 1.9 mmol/L  I-Stat venous blood gas, ([facility] ED only)     Status: Abnormal   Collection Time: 08/09/22  5:13 AM  Result Value Ref Range   pH, Ven 7.310 7.25 - 7.43   pCO2, Ven 38.4 (L) 44 - 60 mmHg   pO2, Ven 35 32 - 45 mmHg   Bicarbonate 18.9 (L) 20.0 - 28.0 mmol/L   TCO2 20 (L) 22 - 32 mmol/L   O2 Saturation 54 %   Acid-base deficit 6.0 (H) 0.0 - 2.0 mmol/L   Sodium 133 (L) 135 - 145 mmol/L   Potassium 3.5 3.5 - 5.1 mmol/L   Calcium, Ion 1.17 1.15 - 1.40 mmol/L   HCT 37.0 36.0 - 46.0 %   Hemoglobin 12.6 12.0 - 15.0 g/dL   Patient temperature 102.5 F    Sample type VENOUS    Comment NOTIFIED PHYSICIAN   Resp Panel by RT-PCR (Flu A&B, Covid) Anterior Nasal Swab     Status: Abnormal   Collection Time: 08/09/22  5:48 AM   Specimen: Anterior Nasal Swab  Result Value Ref Range   SARS Coronavirus 2 by RT PCR POSITIVE (A) NEGATIVE   Influenza A by PCR NEGATIVE NEGATIVE   Influenza B by PCR NEGATIVE NEGATIVE   Imaging Studies: CT Angio Chest PE W and/or Wo Contrast  Result Date: 08/09/2022 CLINICAL DATA:  Shortness of breath and hypoxia. EXAM: CT ANGIOGRAPHY CHEST WITH CONTRAST TECHNIQUE: Multidetector CT imaging of the chest was performed using the standard protocol during bolus administration of intravenous contrast. Multiplanar CT image reconstructions and MIPs were  obtained to evaluate the vascular anatomy. RADIATION DOSE REDUCTION: This exam was performed according to the departmental dose-optimization program which includes automated exposure control, adjustment of the mA and/or kV according to patient size and/or use of iterative reconstruction technique. CONTRAST:  119m OMNIPAQUE IOHEXOL 350 MG/ML SOLN COMPARISON:  09/14/2003 chest CT. FINDINGS: Cardiovascular: Cardiomegaly without substantial pericardial effusion. Coronary artery calcification is  evident. Mild atherosclerotic calcification is noted in the wall of the thoracic aorta. There is no filling defect within the opacified pulmonary arteries to suggest the presence of an acute pulmonary embolus. Mediastinum/Nodes: No mediastinal lymphadenopathy. There is no hilar lymphadenopathy. The esophagus has normal imaging features. There is no axillary lymphadenopathy. Lungs/Pleura: No focal airspace consolidation. Scattered areas of mosaic attenuation in the lung parenchyma is nonspecific but may be related to air trapping/small airways disease. Subtle interlobular septal thickening in the upper lungs evident. No overt airspace pulmonary edema tiny bilateral pleural effusions evident. Upper Abdomen: Liver is enlarged. The liver shows diffusely decreased attenuation suggesting fat deposition. Musculoskeletal: No worrisome lytic or sclerotic osseous abnormality. Review of the MIP images confirms the above findings. IMPRESSION: 1. No CT evidence for acute pulmonary embolus. 2. Scattered areas of mosaic attenuation in the lung parenchyma is nonspecific but may be related to air trapping/small airways disease. Component of mild pulmonary edema not excluded. 3. Tiny bilateral pleural effusions. 4. Hepatomegaly with hepatic steatosis. 5.  Aortic Atherosclerosis (ICD10-I70.0). Electronically Signed   By: EMisty StanleyM.D.   On: 08/09/2022 07:13   DG Chest Port 1 View  Result Date: 08/09/2022 CLINICAL DATA:  61year old female with flank pain, UTI, vomiting, tachycardia, hypoxia. EXAM: PORTABLE CHEST 1 VIEW COMPARISON:  Chest radiographs 11/05/2020 and earlier. FINDINGS: Portable AP view at 0534 hours. Mildly lower lung volumes. Normal cardiac size and mediastinal contours. Visualized tracheal air column is within normal limits. Allowing for portable technique the lungs are clear. Paucity of bowel gas in the visible abdomen. Negative visible osseous structures. IMPRESSION: Negative portable chest. Electronically  Signed   By: HGenevie AnnM.D.   On: 08/09/2022 05:51    ED COURSE and MDM  Nursing notes, initial and subsequent vitals signs, including pulse oximetry, reviewed and interpreted by myself.  Vitals:   08/09/22 0500 08/09/22 0519 08/09/22 0548 08/09/22 0630  BP: (!) 118/51  (!) 118/51 118/61  Pulse: (!) 114  (!) 104 98  Resp: 19  15 (!) 21  Temp: (!) 102.5 F (39.2 C)   98.7 F (37.1 C)  TempSrc: Rectal   Oral  SpO2: 94% 96% 93% 95%   Medications  lactated ringers bolus 1,624 mL (1,624 mLs Intravenous New Bag/Given 08/09/22 0522)  ondansetron (ZOFRAN) injection 4 mg (4 mg Intravenous Given 08/09/22 0516)  fentaNYL (SUBLIMAZE) injection 100 mcg (100 mcg Intravenous Given 08/09/22 0516)  acetaminophen (TYLENOL) tablet 1,000 mg (1,000 mg Oral Given 08/09/22 0526)  cefTRIAXone (ROCEPHIN) 1 g in sodium chloride 0.9 % 100 mL IVPB (0 g Intravenous Stopped 08/09/22 0708)  iohexol (OMNIPAQUE) 350 MG/ML injection 100 mL (100 mLs Intravenous Contrast Given 08/09/22 0640)   5:14 AM Rectal temperature is 102.5.  Blood cultures and lactate ordered.  Venous blood gas not consistent with diabetic ketoacidosis.  Lactated ringer bolus initiated.  Rocephin ordered for presumed urinary source of fever.   5:51 AM COVID and influenza tests pending.  Patient unable to provide a urine specimen at this time likely due to dehydration from persistent nausea and vomiting.  IV fluid bolus infusing.   6:13  AM Discussed with Dr. Bridgett Larsson of hospitalist service.  He is not convinced the patient's source of illness is a urinary tract infection as he believes to IV doses of Rocephin given for a known sensitive Klebsiella infection should have adequately initiated treatment.  He would like her evaluated for a pulmonary embolism.  We will obtain a CT angio chest.  7:18 AM No evidence of pulmonary embolism.  The patient may have some pulmonary edema.  We will add a BNP.    PROCEDURES  Procedures   ED DIAGNOSES     ICD-10-CM    1. Nausea and vomiting in adult  R11.2     2. Hypoxia  R09.02     3. Acute febrile illness  R50.9     4. Right hip pain  M25.551          Nemesio Castrillon, Jenny Reichmann, MD 08/09/22 [id number]

## 2022-08-09 NOTE — H&P (Addendum)
History and Physical    Patient: Courtney Parks OZY:[id number] DOB: 1961-01-29 DOA: 08/09/2022 DOS: the patient was seen and examined on 08/09/2022 PCP: Pcp, No  Patient coming from: Home - lives with son; NOK: Courtney Parks, [id number]   Chief Complaint: n/v  HPI: Courtney Parks is a 61 y.o. female with medical history significant of DM, HTN, HLD, and depression/anxiety presenting with n/v.  She was seen in the ER on 12/5 with urinary frequency and treated with Rocephin -> Keflex.  She returned on 12/6 with n/v; she was rehydrated and again discharged home with Keflex.   She continued to have n/v and returned to [facility] last night.  She reports having severe R hip pain which she likens to kidney stone pain and childbirth.  She also had n/v, last this AM.  She had not noticed SOB but was on O2 in the ER on 12/6.  She began feeling SOB last night.  She has had 1 COVID vaccine and is not opposed to treatment.    She has a h/o R foot surgery in August and is fully non-weight bearing on that foot.    ER Course:  [facility] to [facility] carryover, per Dr. Bridgett Larsson:  UTI symptoms, been to ER 3 times in last 4 days, was dx's with UTI on 12-5.  had N/V on 12-5, was given dose of IV rocephin on 12-5. sent home with Rx for Keflex. Went back to ER on 12-6 with N/V.  another dose of IV Rocephin given on 12-6.  C ame back to ER on 12-7. urine cx from 12-5 grew rocephin sensitive Klebsiella. pt hypoxic in ER. cxr negative. normal WBC. lactic acid normal.   discussed with EDP that pt has already had 66% of treatment for UTI with 2 doses of IV rocephin. I doubt that UTI is cause of her N/V and now with new hypoxia. asked for EDP to obtain CTPA to rule out PE. pt received her 3rd dose of IV Rocephin on 12-8.   pt's diagnosis is not clear yet and EDP not completed workup.  Discussed that there are no beds available at this time and EDP should continue workup for patient while waiting for hospital bed.   Acute cystitis that has been treated with IV Rocephin is likely not the cause of pt's persistent N/V.     Review of Systems: As mentioned in the history of present illness. All other systems reviewed and are negative. Past Medical History:  Diagnosis Date   Anxiety    Depression    Diabetes mellitus, type II ([place])    Hyperlipidemia    Hypertension    Kidney stones    Past Surgical History:  Procedure Laterality Date   CHOLECYSTECTOMY  02/28/2012   Procedure: LAPAROSCOPIC CHOLECYSTECTOMY WITH INTRAOPERATIVE CHOLANGIOGRAM;  Surgeon: Imogene Burn. Georgette Dover, MD;  Location: [facility];  Service: General;  Laterality: N/A;   I & D EXTREMITY Right 04/03/2022   Procedure: IRRIGATION AND DEBRIDEMENT RIGHT FOOT WITH WOUND VAC;  Surgeon: Felipa Furnace, DPM;  Location: [facility];  Service: Podiatry;  Laterality: Right;   IRRIGATION AND DEBRIDEMENT FOOT Right 03/30/2022   Procedure: IRRIGATION AND DEBRIDEMENT FOOT;  Surgeon: [name], DPM;  Location: [facility];  Service: Podiatry;  Laterality: Right;   ULNAR NERVE REPAIR     URETERAL STENT PLACEMENT     Social History:  reports that she quit smoking about 38 years ago. Her smoking use included cigarettes. She has never used smokeless  tobacco. She reports that she does not drink alcohol and does not use drugs.  Allergies  Allergen Reactions   Codeine     REACTION: Nausea   Demerol Nausea And Vomiting   Other Other (See Comments)    Narcotics- drops her blood pressure   Shellfish Allergy Swelling    Family History  Problem Relation Age of Onset   Diabetes Mother    Hypertension Mother    Colon cancer Mother    Hypertension Father    ADD / ADHD Maternal Grandmother     Prior to Admission medications   Medication Sig Start Date End Date Taking? Authorizing Provider  acetaminophen (TYLENOL) 500 MG tablet Take 500 mg by mouth every 6 (six) hours as needed for moderate pain.   Yes [provider]  aspirin EC 81 MG tablet Take 81 mg by  mouth daily. Swallow whole.   Yes [provider]  cephALEXin (KEFLEX) 500 MG capsule Take 1 capsule (500 mg total) by mouth 4 (four) times daily. 08/06/22  Yes Fredia Sorrow, MD  ibuprofen (ADVIL) 200 MG tablet Take 200-800 mg by mouth every 6 (six) hours as needed for moderate pain.   Yes [provider]  insulin glargine, 1 Unit Dial, (TOUJEO SOLOSTAR) 300 UNIT/ML Solostar Pen Inject 42 Units into the skin daily in the afternoon. Patient taking differently: Inject 64 Units into the skin daily in the afternoon. 04/05/22  Yes Amin, Jeanella Flattery, MD  Magnesium 100 MG CAPS Take 100 mg by mouth daily.   Yes [provider]  ondansetron (ZOFRAN-ODT) 4 MG disintegrating tablet Take 1 tablet (4 mg total) by mouth every 8 (eight) hours as needed for nausea or vomiting. 08/07/22  Yes Horton, Barbette Hair, MD  phenazopyridine (PYRIDIUM) 200 MG tablet Take 1 tablet (200 mg total) by mouth 3 (three) times daily. 08/06/22  Yes Fredia Sorrow, MD  Probiotic Product (PROBIOTIC BLEND PO) Take 1 tablet by mouth daily.   Yes [provider]  TRULICITY 3 WU/9.8JX SOPN Inject 3 mg into the skin once a week. 07/03/22  Yes [provider]  Turmeric 500 MG CAPS Take 500 mg by mouth daily.   Yes [provider]  vitamin C (ASCORBIC ACID) 250 MG tablet Take 250 mg by mouth daily.   Yes [provider]  zinc gluconate 50 MG tablet Take 50 mg by mouth daily.   Yes [provider]  Continuous Blood Gluc Sensor (FREESTYLE LIBRE 2 SENSOR) MISC 1 each by Does not apply route PRO. 04/05/22   Amin, Jeanella Flattery, MD  Insulin Pen Needle (PEN NEEDLES 3/16") 31G X 5 MM MISC 100 each by Does not apply route 4 (four) times daily. 04/05/22   Amin, Ankit Chirag, MD  senna-docusate (SENOKOT-S) 8.6-50 MG tablet Take 1 tablet by mouth at bedtime as needed for moderate constipation. Patient not taking: Reported on 08/09/2022 04/05/22   Damita Lack, MD    Physical  Exam: Vitals:   08/09/22 1430 08/09/22 1656 08/09/22 1657 08/09/22 1734  BP: (!) 130/104 (!) 141/63    Pulse:  95    Resp: 20 16    Temp:  98.9 F (37.2 C)    TempSrc:  Axillary    SpO2:  (!) 88% 97%   Weight:    81.2 kg  Height:    '5\' 7"'$  (1.702 m)   General:  Appears calm and comfortable and is in NAD, on [place] O2 Eyes:  EOMI, normal lids, iris ENT:  grossly normal hearing, lips & tongue, mmm; poor dentition Neck:  no LAD, masses or thyromegaly Cardiovascular:  RRR, no m/r/g. No LE edema.  Respiratory:   CTA bilaterally with no wheezes/rales/rhonchi.  Normal respiratory effort. Abdomen:  soft, NT, ND Back:   normal alignment, no CVAT Skin:  no rash or induration seen on limited exam Musculoskeletal:  grossly normal tone BUE/BLE, good ROM, no bony abnormality; R foot appears to be healing welll Psychiatric:  grossly normal mood and affect, speech fluent and appropriate, AOx3 Neurologic:  CN 2-12 grossly intact, moves all extremities in coordinated fashion   Radiological Exams on Admission: Independently reviewed - see discussion in A/P where applicable  CT Renal Stone Study  Result Date: 08/09/2022 CLINICAL DATA:  Flank pain and hip pain EXAM: CT ABDOMEN AND PELVIS WITHOUT CONTRAST TECHNIQUE: Multidetector CT imaging of the abdomen and pelvis was performed following the standard protocol without IV contrast. RADIATION DOSE REDUCTION: This exam was performed according to the departmental dose-optimization program which includes automated exposure control, adjustment of the mA and/or kV according to patient size and/or use of iterative reconstruction technique. COMPARISON:  CT abdomen and pelvis dated December 5th 2023 FINDINGS: Lower chest: Trace pericardial and pleural effusions. Small hiatal hernia. No acute abnormality. Hepatobiliary: No focal liver abnormality is seen. Status post cholecystectomy. No biliary dilatation. Pancreas: Unremarkable. No pancreatic ductal dilatation or  surrounding inflammatory changes. Spleen: Normal in size without focal abnormality. Adrenals/Urinary Tract: Bilateral adrenal glands are unremarkable. No hydronephrosis. Rim enhancing lesion of the lower pole of the left kidney measuring 3.1 x 2.9 cm on series 2, image 36. Not visible on prior exam due to lack of IV contrast. Bladder is unremarkable. Stomach/Bowel: Stomach is within normal limits. Appendix appears normal. No evidence of bowel wall thickening, distention, or inflammatory changes. Vascular/Lymphatic: Aortic atherosclerosis. No enlarged abdominal or pelvic lymph nodes. Reproductive: Uterus and bilateral adnexa are unremarkable. Other: No abdominal wall hernia or abnormality. No abdominopelvic ascites. Musculoskeletal: No acute or significant osseous findings. IMPRESSION: 1. Lesion of the lower pole of the left kidney measuring up to 3.1 cm with rim enhancement secondary to excreted contrast from prior PE study. Favored to be renal abscess, although cystic renal mass is also a concern. Recommend contrast-enhanced abdominal MRI for better evaluation. 2. Trace pericardial and pleural effusions. 3. Aortic Atherosclerosis (ICD10-I70.0). Electronically Signed   By: Yetta Glassman M.D.   On: 08/09/2022 15:41   CT Angio Chest PE W and/or Wo Contrast  Result Date: 08/09/2022 CLINICAL DATA:  Shortness of breath and hypoxia. EXAM: CT ANGIOGRAPHY CHEST WITH CONTRAST TECHNIQUE: Multidetector CT imaging of the chest was performed using the standard protocol during bolus administration of intravenous contrast. Multiplanar CT image reconstructions and MIPs were obtained to evaluate the vascular anatomy. RADIATION DOSE REDUCTION: This exam was performed according to the departmental dose-optimization program which includes automated exposure control, adjustment of the mA and/or kV according to patient size and/or use of iterative reconstruction technique. CONTRAST:  154m OMNIPAQUE IOHEXOL 350 MG/ML SOLN  COMPARISON:  09/14/2003 chest CT. FINDINGS: Cardiovascular: Cardiomegaly without substantial pericardial effusion. Coronary artery calcification is evident. Mild atherosclerotic calcification is noted in the wall of the thoracic aorta. There is no filling defect within the opacified pulmonary arteries to suggest the presence of an acute pulmonary embolus. Mediastinum/Nodes: No mediastinal lymphadenopathy. There is no hilar lymphadenopathy. The esophagus has normal imaging features. There is no axillary lymphadenopathy. Lungs/Pleura: No focal airspace consolidation. Scattered areas of mosaic attenuation in the lung parenchyma  is nonspecific but may be related to air trapping/small airways disease. Subtle interlobular septal thickening in the upper lungs evident. No overt airspace pulmonary edema tiny bilateral pleural effusions evident. Upper Abdomen: Liver is enlarged. The liver shows diffusely decreased attenuation suggesting fat deposition. Musculoskeletal: No worrisome lytic or sclerotic osseous abnormality. Review of the MIP images confirms the above findings. IMPRESSION: 1. No CT evidence for acute pulmonary embolus. 2. Scattered areas of mosaic attenuation in the lung parenchyma is nonspecific but may be related to air trapping/small airways disease. Component of mild pulmonary edema not excluded. 3. Tiny bilateral pleural effusions. 4. Hepatomegaly with hepatic steatosis. 5.  Aortic Atherosclerosis (ICD10-I70.0). Electronically Signed   By: Misty Stanley M.D.   On: 08/09/2022 07:13   DG Chest Port 1 View  Result Date: 08/09/2022 CLINICAL DATA:  61 year old female with flank pain, UTI, vomiting, tachycardia, hypoxia. EXAM: PORTABLE CHEST 1 VIEW COMPARISON:  Chest radiographs 11/05/2020 and earlier. FINDINGS: Portable AP view at 0534 hours. Mildly lower lung volumes. Normal cardiac size and mediastinal contours. Visualized tracheal air column is within normal limits. Allowing for portable technique the  lungs are clear. Paucity of bowel gas in the visible abdomen. Negative visible osseous structures. IMPRESSION: Negative portable chest. Electronically Signed   By: Genevie Ann M.D.   On: 08/09/2022 05:51    EKG: Independently reviewed.  Sinus tachycardia with rate 117; no evidence of acute ischemia   Labs on Admission: I have personally reviewed the available labs and imaging studies at the time of the admission.  Pertinent labs:    VBG: 7.310/38.4/54 Na++ 133 CO2 20 Glucose 242 Anion gap 16 BNP 224 Lactate 1.6 WBC 7.5 with lymphopenia Platelets 95 COVID POSITIVE UA: >80 ketones, trace LE, 30 protein   Assessment and Plan: Principal Problem:   N&V (nausea and vomiting) Active Problems:   Type 2 diabetes mellitus without complications (HCC)   UTI (urinary tract infection)   COVID-19 virus infection   Renal mass, left    N/V, UTI -Patient with several days of n/v, diagnosed with UTI -N/V could be related to UTI and/or COVID (see below) -Continue Keflex to complete course -Zofran as needed -She has not vomited since this AM and feels up to clear liquids, will advance diet as tolerated to bland -She did have mild hypotension in the ER and likely hypovolemia but has been bolused and appears minimally hypovolemic for now; will give gentle IVF infusion for another 500 cc and then stop  Acute respiratory failure with hypoxia due to COVID-19 PNA -Patient with presenting with SOB, hypoxia to 88% on RA -She does not have a usual home O2 requirement and is currently requiring 2L [place] O2 -COVID POSITIVE -The patient has comorbidities which may increase the risk for ARDS/MODS including:  HTN, DM -CXR with multifocal opacities which may be c/w COVID  -Will not treat with broad-spectrum antibiotics if procalcitonin <0.5 -Will admit for further evaluation, close monitoring, and treatment -Monitor on telemetry x at least 24 hours -At this time, will attempt to avoid use of aerosolized  medications and use HFAs instead -Will order steroids and Paxlovid given +COVID test, +CXR, and hypoxia <94% on room air -Will ask the patient to maintain an awake prone position as much as possible -PT/OT consults -Encourage mobilization/ambulation as much as possible -Patient was seen wearing full PPE including: gown, gloves, N95, and eye protection; donning and doffing was in compliance with current standards.  Renal mass -Incidental finding on imaging -She reports pain in  her right hip but mass is on L -Could be abscess, neoplasm, other -Will order MRI for further evaluation  DM -A1c in 03/2022 was 10.3 - poor control -May utilize continuous glucose monitoring -hold Trulicity -Continue glargine -Cover with moderate-scale SSI -She is not in DKA based on labs so far  R hip pain -Nothing obvious on CT -MRI abdomen pending, asked to review hip as well -Pain control -Appears comfortable now     Advance Care Planning:   Code Status: Full Code   Consults: RT; PT/OT  DVT Prophylaxis: Lovenox  Family Communication: None present; I spoke with her son at the time of admission  Severity of Illness: The appropriate patient status for this patient is INPATIENT. Inpatient status is judged to be reasonable and necessary in order to provide the required intensity of service to ensure the patient's safety. The patient's presenting symptoms, physical exam findings, and initial radiographic and laboratory data in the context of their chronic comorbidities is felt to place them at high risk for further clinical deterioration. Furthermore, it is not anticipated that the patient will be medically stable for discharge from the hospital within 2 midnights of admission.   * I certify that at the point of admission it is my clinical judgment that the patient will require inpatient hospital care spanning beyond 2 midnights from the point of admission due to high intensity of service, high risk for  further deterioration and high frequency of surveillance required.*  Author: Karmen Bongo, MD 08/09/2022 5:45 PM  For on call review [email or web address].

## 2022-08-09 NOTE — Telephone Encounter (Signed)
Post ED Visit - Positive Culture Follow-up  Culture report reviewed by antimicrobial stewardship pharmacist: [place] Team '[]'$  Elenor Quinones, Pharm.D. '[]'$  Heide Guile, Pharm.D., BCPS AQ-ID '[]'$  Parks Neptune, Pharm.D., BCPS '[]'$  Alycia Rossetti, Pharm.D., BCPS '[]'$  Loyal, Pharm.D., BCPS, AAHIVP '[]'$  Legrand Como, Pharm.D., BCPS, AAHIVP '[]'$  Salome Arnt, PharmD, BCPS '[]'$  Johnnette Gourd, PharmD, BCPS '[]'$  Hughes Better, PharmD, BCPS '[]'$  Leeroy Cha, PharmD '[]'$  Laqueta Linden, PharmD, BCPS '[]'$  Albertina Parr, PharmD  [place] Team '[]'$  Leodis Sias, PharmD '[]'$  Lindell Spar, PharmD '[]'$  Royetta Asal, PharmD '[]'$  Graylin Shiver, Rph '[]'$  Rema Fendt) Glennon Mac, PharmD '[]'$  Arlyn Dunning, PharmD '[]'$  Netta Cedars, PharmD '[]'$  Dia Sitter, PharmD '[]'$  Leone Haven, PharmD '[]'$  Gretta Arab, PharmD '[]'$  Theodis Shove, PharmD '[]'$  Peggyann Juba, PharmD '[]'$  Reuel Boom, PharmD   Positive urine culture Treated with Cephalexin, organism sensitive to the same and no further patient follow-up is required at this time.   Esmeralda Arthur, Pharm D  Harlon Flor Talley 08/09/2022, 11:12 AM

## 2022-08-09 NOTE — Plan of Care (Signed)
   Patient Name: Courtney Parks, Courtney Parks DOB: 1961/02/09 MRN: [id number] Transferring facility: [facility] Requesting provider: molpus, MD Reason for transfer:  61 yo WF with hx of type 1 DM, been to ER 3 times in last 4 days, was dx's with UTI on 12-5.  had N/V on 12-5, was given dose of IV rocephin on 12-5. sent home with Rx for Keflex.  went back to ER on 12-6 with N/V.  another dose of IV Rocephin given on 12-6.   came back to ER on 12-7. urine cx from 12-5 grew rocephin sensitive Klebsiella. pt hypoxic in ER. cxr negative. normal WBC. lactic acid normal.  discussed with EDP that pt has already had 66% of treatment for UTI with 2 doses of IV rocephin. I doubt that UTI is cause of her N/V and now with new hypoxia. asked for EDP to obtain CTPA to rule out PE. pt received her 3rd dose of IV Rocephin on 12-8.  pt's diagnosis is not clear yet and EDP not completed workup.  Discussed that there are no beds available at this time and EDP should continue workup for patient while waiting for hospital bed.  Acute cystitis that has been treated with IV Rocephin is likely not the cause of pt's persistent N/V.  discussed with EDP that if CTPA shows PE that he needs to call [facility] back to update [place] on pt's condition as a acute PE would justify change in bed status(i.e. needing telemetry and possibly progressive bed)  Going to: [facility] Admission Status: observation Bed Type: med/surg To Do: f/u on CTPA.  [facility] will assume care on arrival to accepting facility. Until arrival, care as per EDP. However, [facility] available 24/7 for questions and assistance.   Nursing staff please page [place] and Consults [phone]) as soon as the patient arrives to the hospital.  Kristopher Oppenheim, DO [facility]

## 2022-08-10 ENCOUNTER — Inpatient Hospital Stay (HOSPITAL_COMMUNITY): Payer: 59

## 2022-08-10 DIAGNOSIS — N151 Renal and perinephric abscess: Secondary | ICD-10-CM

## 2022-08-10 LAB — CBC WITH DIFFERENTIAL/PLATELET
Abs Immature Granulocytes: 0.05 10*3/uL (ref 0.00–0.07)
Basophils Absolute: 0 10*3/uL (ref 0.0–0.1)
Basophils Relative: 0 %
Eosinophils Absolute: 0 10*3/uL (ref 0.0–0.5)
Eosinophils Relative: 0 %
HCT: 34.4 % — ABNORMAL LOW (ref 36.0–46.0)
Hemoglobin: 11.1 g/dL — ABNORMAL LOW (ref 12.0–15.0)
Immature Granulocytes: 1 %
Lymphocytes Relative: 13 %
Lymphs Abs: 0.9 10*3/uL (ref 0.7–4.0)
MCH: 27.5 pg (ref 26.0–34.0)
MCHC: 32.3 g/dL (ref 30.0–36.0)
MCV: 85.4 fL (ref 80.0–100.0)
Monocytes Absolute: 0.2 10*3/uL (ref 0.1–1.0)
Monocytes Relative: 3 %
Neutro Abs: 5.5 10*3/uL (ref 1.7–7.7)
Neutrophils Relative %: 83 %
Platelets: 90 10*3/uL — ABNORMAL LOW (ref 150–400)
RBC: 4.03 MIL/uL (ref 3.87–5.11)
RDW: 14.6 % (ref 11.5–15.5)
WBC: 6.7 10*3/uL (ref 4.0–10.5)
nRBC: 0 % (ref 0.0–0.2)

## 2022-08-10 LAB — COMPREHENSIVE METABOLIC PANEL
ALT: 30 U/L (ref 0–44)
AST: 23 U/L (ref 15–41)
Albumin: 3 g/dL — ABNORMAL LOW (ref 3.5–5.0)
Alkaline Phosphatase: 110 U/L (ref 38–126)
Anion gap: 15 (ref 5–15)
BUN: 16 mg/dL (ref 8–23)
CO2: 20 mmol/L — ABNORMAL LOW (ref 22–32)
Calcium: 9.6 mg/dL (ref 8.9–10.3)
Chloride: 100 mmol/L (ref 98–111)
Creatinine, Ser: 0.57 mg/dL (ref 0.44–1.00)
GFR, Estimated: >60 mL/min (ref >60)
Glucose, Bld: 270 mg/dL — ABNORMAL HIGH (ref 70–99)
Potassium: 3.9 mmol/L (ref 3.5–5.1)
Sodium: 135 mmol/L (ref 135–145)
Total Bilirubin: 1 mg/dL (ref 0.3–1.2)
Total Protein: 6.8 g/dL (ref 6.5–8.1)

## 2022-08-10 LAB — GLUCOSE, CAPILLARY
Glucose-Capillary: 274 mg/dL — ABNORMAL HIGH (ref 70–99)
Glucose-Capillary: 301 mg/dL — ABNORMAL HIGH (ref 70–99)
Glucose-Capillary: 268 mg/dL — ABNORMAL HIGH (ref 70–99)
Glucose-Capillary: 273 mg/dL — ABNORMAL HIGH (ref 70–99)

## 2022-08-10 MED ORDER — DICLOFENAC SODIUM 1 % EX GEL
2.0000 g | Freq: Four times a day (QID) | CUTANEOUS | Status: DC
  Administered 2022-08-10 – 2022-08-14 (×11): 2 g via TOPICAL
  Filled 2022-08-10: qty 100

## 2022-08-10 MED ORDER — CIPROFLOXACIN IN D5W 400 MG/200ML IV SOLN
400.0000 mg | Freq: Two times a day (BID) | INTRAVENOUS | Status: DC
  Administered 2022-08-10 – 2022-08-12 (×5): 400 mg via INTRAVENOUS
  Filled 2022-08-10 (×6): qty 200

## 2022-08-10 MED ORDER — CALCIUM CARBONATE ANTACID 500 MG PO CHEW
1.0000 | CHEWABLE_TABLET | Freq: Three times a day (TID) | ORAL | Status: DC | PRN
  Administered 2022-08-11 – 2022-08-14 (×6): 200 mg via ORAL
  Filled 2022-08-10 (×7): qty 1

## 2022-08-10 NOTE — Progress Notes (Signed)
Patient ID: Courtney Parks, female   DOB: May 06, 1961, 61 y.o.   MRN: [id number]  Urology Consult   Physician requesting consult: Dr. 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  Urine culture was positive for Klebsiella.  Imaging on 12/5 indicated a left complex appearing renal cyst (seen as a cyst in July 2023) that now appeared to be rim-enhancing.  She presented back to the hospital on 12/8 with worsening generalized symptoms including right hip pain, malaise, and frequency/urgency/dysuria.  Repeat CT imaging revealed the complex left renal lesion to be enlarged compared to 12/5 still with rim enhancement more concerning for infectious cyst or abscess.  She was incidentally found to be COVID-19 positive.    Past Medical History:  Diagnosis Date   Anxiety    Depression    Diabetes mellitus, type II ([place])    Hyperlipidemia    Hypertension    Kidney stones     Past Surgical History:  Procedure Laterality Date   CHOLECYSTECTOMY  02/28/2012   Procedure: LAPAROSCOPIC CHOLECYSTECTOMY WITH INTRAOPERATIVE CHOLANGIOGRAM;  Surgeon: Imogene Burn. Georgette Dover, MD;  Location: [place];  Service: General;  Laterality: N/A;   I & D EXTREMITY Right 04/03/2022   Procedure: IRRIGATION AND DEBRIDEMENT RIGHT FOOT WITH WOUND VAC;  Surgeon: Felipa Furnace, DPM;  Location: [facility];  Service: Podiatry;  Laterality: Right;   IRRIGATION AND DEBRIDEMENT FOOT Right 03/30/2022   Procedure: IRRIGATION AND DEBRIDEMENT FOOT;  Surgeon: [name], DPM;  Location: [facility];  Service: Podiatry;  Laterality: Right;   ULNAR NERVE REPAIR     URETERAL STENT PLACEMENT       Current Hospital Medications:  Home meds:  No current facility-administered medications on file prior to encounter.   Current Outpatient Medications  on File Prior to Encounter  Medication Sig Dispense Refill   acetaminophen (TYLENOL) 500 MG tablet Take 500 mg by mouth every 6 (six) hours as needed for moderate pain.     aspirin EC 81 MG tablet Take 81 mg by mouth daily. Swallow whole.     cephALEXin (KEFLEX) 500 MG capsule Take 1 capsule (500 mg total) by mouth 4 (four) times daily. 28 capsule 0   ibuprofen (ADVIL) 200 MG tablet Take 200-800 mg by mouth every 6 (six) hours as needed for moderate pain.     insulin glargine, 1 Unit Dial, (TOUJEO SOLOSTAR) 300 UNIT/ML Solostar Pen Inject 42 Units into the skin daily in the afternoon. (Patient taking differently: Inject 64 Units into the skin daily in the afternoon.) 1.5 mL 1   Magnesium 100 MG CAPS Take 100 mg by mouth daily.     ondansetron (ZOFRAN-ODT) 4 MG disintegrating tablet Take 1 tablet (4 mg total) by mouth every 8 (eight) hours as needed for nausea or vomiting. 20 tablet 0   phenazopyridine (PYRIDIUM) 200 MG tablet Take 1 tablet (200 mg total) by mouth 3 (three) times daily. 6 tablet 0   Probiotic Product (PROBIOTIC BLEND PO) Take 1 tablet by mouth daily.     TRULICITY 3 DP/8.2UM SOPN Inject 3 mg into the skin once a week.     Turmeric 500 MG CAPS Take 500 mg by mouth daily.     vitamin C (ASCORBIC ACID) 250 MG tablet Take 250 mg by mouth daily.  zinc gluconate 50 MG tablet Take 50 mg by mouth daily.     Continuous Blood Gluc Sensor (FREESTYLE LIBRE 2 SENSOR) MISC 1 each by Does not apply route PRO. 1 each 4   Insulin Pen Needle (PEN NEEDLES 3/16") 31G X 5 MM MISC 100 each by Does not apply route 4 (four) times daily. 100 each 3     Scheduled Meds:  vitamin C  500 mg Oral Daily   aspirin EC  81 mg Oral Daily   diclofenac Sodium  2 g Topical QID   docusate sodium  100 mg Oral BID   enoxaparin (LOVENOX) injection  40 mg Subcutaneous Q24H   insulin aspart  0-15 Units Subcutaneous TID WC   insulin aspart  0-5 Units Subcutaneous QHS   insulin glargine-yfgn  40 Units Subcutaneous  Q1500   methylPREDNISolone (SOLU-MEDROL) injection  81.25 mg Intravenous Q24H   Followed by   Derrill Memo ON 08/12/2022] predniSONE  50 mg Oral Daily   nirmatrelvir/ritonavir EUA  3 tablet Oral BID   phenazopyridine  200 mg Oral TID   sodium chloride flush  3 mL Intravenous Q12H   zinc sulfate  220 mg Oral Daily   Continuous Infusions:  sodium chloride     ciprofloxacin     PRN Meds:.sodium chloride, acetaminophen, albuterol, bisacodyl, calcium carbonate, chlorpheniramine-HYDROcodone, guaiFENesin-dextromethorphan, ondansetron **OR** ondansetron (ZOFRAN) IV, oxyCODONE, polyethylene glycol, sodium chloride flush, sodium phosphate  Allergies:  Allergies  Allergen Reactions   Codeine     REACTION: Nausea   Demerol Nausea And Vomiting   Other Other (See Comments)    Narcotics- drops her blood pressure   Shellfish Allergy Swelling    Family History  Problem Relation Age of Onset   Diabetes Mother    Hypertension Mother    Colon cancer Mother    Hypertension Father    ADD / ADHD Maternal Grandmother     Social History:  reports that she quit smoking about 38 years ago. Her smoking use included cigarettes. She has never used smokeless tobacco. She reports that she does not drink alcohol and does not use drugs.  ROS: A complete review of systems was performed.  All systems are negative except for pertinent findings as noted.  Physical Exam:  Vital signs in last 24 hours: Temp:  [97.9 F (36.6 C)-98.9 F (37.2 C)] 98.6 F (37 C) (12/09 0855) Pulse Rate:  [77-95] 80 (12/09 0855) Resp:  [15-18] 15 (12/09 0855) BP: (131-141)/(63-67) 139/67 (12/09 0855) SpO2:  [88 %-100 %] 99 % (12/09 0855) Weight:  [81.2 kg] 81.2 kg (12/08 1734) Constitutional:  Alert and oriented, No acute distress Cardiovascular: Regular rate and rhythm, No JVD Respiratory: Normal respiratory effort, Lungs clear bilaterally GI: Abdomen is soft, nontender, nondistended, no abdominal masses GU: No CVA  tenderness Lymphatic: No lymphadenopathy Neurologic: Grossly intact, no focal deficits Psychiatric: Normal mood and affect  Laboratory Data:  Recent Labs    08/09/22 0506 08/09/22 0513 08/10/22 0423  WBC 7.5  --  6.7  HGB 12.2 12.6 11.1*  HCT 37.1 37.0 34.4*  PLT 95*  --  90*    Recent Labs    08/09/22 0506 08/09/22 0513 08/10/22 0423  NA 133* 133* 135  K 3.6 3.5 3.9  CL 97*  --  100  GLUCOSE 242*  --  270*  BUN 19  --  16  CALCIUM 9.0  --  9.6  CREATININE 0.58  --  0.57     Results for orders placed or performed  during the hospital encounter of 08/09/22 (from the past 24 hour(s))  Glucose, capillary     Status: Abnormal   Collection Time: 08/09/22  4:48 PM  Result Value Ref Range   Glucose-Capillary 148 (H) 70 - 99 mg/dL  C-reactive protein     Status: Abnormal   Collection Time: 08/09/22  5:51 PM  Result Value Ref Range   CRP 26.9 (H) <1.0 mg/dL  D-dimer, quantitative     Status: Abnormal   Collection Time: 08/09/22  5:51 PM  Result Value Ref Range   D-Dimer, Quant 1.71 (H) 0.00 - 0.50 ug/mL-FEU  Ferritin     Status: Abnormal   Collection Time: 08/09/22  5:51 PM  Result Value Ref Range   Ferritin 327 (H) 11 - 307 ng/mL  Fibrinogen     Status: Abnormal   Collection Time: 08/09/22  5:51 PM  Result Value Ref Range   Fibrinogen >800 (H) 210 - 475 mg/dL  Lactate dehydrogenase     Status: None   Collection Time: 08/09/22  5:51 PM  Result Value Ref Range   LDH 154 98 - 192 U/L  Procalcitonin     Status: None   Collection Time: 08/09/22  5:51 PM  Result Value Ref Range   Procalcitonin 8.54 ng/mL  Glucose, capillary     Status: Abnormal   Collection Time: 08/09/22  9:34 PM  Result Value Ref Range   Glucose-Capillary 222 (H) 70 - 99 mg/dL  CBC with Differential/Platelet     Status: Abnormal   Collection Time: 08/10/22  4:23 AM  Result Value Ref Range   WBC 6.7 4.0 - 10.5 K/uL   RBC 4.03 3.87 - 5.11 MIL/uL   Hemoglobin 11.1 (L) 12.0 - 15.0 g/dL   HCT 34.4  (L) 36.0 - 46.0 %   MCV 85.4 80.0 - 100.0 fL   MCH 27.5 26.0 - 34.0 pg   MCHC 32.3 30.0 - 36.0 g/dL   RDW 14.6 11.5 - 15.5 %   Platelets 90 (L) 150 - 400 K/uL   nRBC 0.0 0.0 - 0.2 %   Neutrophils Relative % 83 %   Neutro Abs 5.5 1.7 - 7.7 K/uL   Lymphocytes Relative 13 %   Lymphs Abs 0.9 0.7 - 4.0 K/uL   Monocytes Relative 3 %   Monocytes Absolute 0.2 0.1 - 1.0 K/uL   Eosinophils Relative 0 %   Eosinophils Absolute 0.0 0.0 - 0.5 K/uL   Basophils Relative 0 %   Basophils Absolute 0.0 0.0 - 0.1 K/uL   Immature Granulocytes 1 %   Abs Immature Granulocytes 0.05 0.00 - 0.07 K/uL  Comprehensive metabolic panel     Status: Abnormal   Collection Time: 08/10/22  4:23 AM  Result Value Ref Range   Sodium 135 135 - 145 mmol/L   Potassium 3.9 3.5 - 5.1 mmol/L   Chloride 100 98 - 111 mmol/L   CO2 20 (L) 22 - 32 mmol/L   Glucose, Bld 270 (H) 70 - 99 mg/dL   BUN 16 8 - 23 mg/dL   Creatinine, Ser 0.57 0.44 - 1.00 mg/dL   Calcium 9.6 8.9 - 10.3 mg/dL   Total Protein 6.8 6.5 - 8.1 g/dL   Albumin 3.0 (L) 3.5 - 5.0 g/dL   AST 23 15 - 41 U/L   ALT 30 0 - 44 U/L   Alkaline Phosphatase 110 38 - 126 U/L   Total Bilirubin 1.0 0.3 - 1.2 mg/dL   GFR, Estimated >60 >60 mL/min  Anion gap 15 5 - 15  Glucose, capillary     Status: Abnormal   Collection Time: 08/10/22  8:56 AM  Result Value Ref Range   Glucose-Capillary 274 (H) 70 - 99 mg/dL  Glucose, capillary     Status: Abnormal   Collection Time: 08/10/22 12:46 PM  Result Value Ref Range   Glucose-Capillary 301 (H) 70 - 99 mg/dL   Recent Results (from the past 240 hour(s))  Urine Culture     Status: Abnormal   Collection Time: 08/06/22  6:55 AM   Specimen: Urine, Clean Catch  Result Value Ref Range Status   Specimen Description   Final    URINE, CLEAN CATCH Performed at [facility], [address], [place], [place] [address]    Special Requests   Final    NONE Performed at [facility], [address], [place], [place] [address]    Culture >=100,000 COLONIES/mL KLEBSIELLA PNEUMONIAE (A)  Final   Report Status 08/08/2022 FINAL  Final   Organism ID, Bacteria KLEBSIELLA PNEUMONIAE (A)  Final      Susceptibility   Klebsiella pneumoniae - MIC*    AMPICILLIN >=32 RESISTANT Resistant     CEFAZOLIN <=4 SENSITIVE Sensitive     CEFEPIME <=0.12 SENSITIVE Sensitive     CEFTRIAXONE <=0.25 SENSITIVE Sensitive     CIPROFLOXACIN <=0.25 SENSITIVE Sensitive     GENTAMICIN <=1 SENSITIVE Sensitive     IMIPENEM <=0.25 SENSITIVE Sensitive     NITROFURANTOIN 64 INTERMEDIATE Intermediate     TRIMETH/SULFA <=20 SENSITIVE Sensitive     AMPICILLIN/SULBACTAM 16 INTERMEDIATE Intermediate     PIP/TAZO <=4 SENSITIVE Sensitive     * >=100,000 COLONIES/mL KLEBSIELLA PNEUMONIAE  Blood culture (routine x 2)     Status: None (Preliminary result)   Collection Time: 08/07/22  5:35 AM   Specimen: BLOOD  Result Value Ref Range Status   Specimen Description   Final    BLOOD RIGHT ANTECUBITAL Performed at [facility], [address], [place], [place] [address]    Special Requests   Final    BOTTLES DRAWN AEROBIC AND ANAEROBIC Blood Culture results may not be optimal due to an inadequate volume of blood received in culture bottles Performed at [facility], [address] [place], [place] [address]    Culture   Final    NO GROWTH 3 DAYS Performed at [facility], [place] [address], [place], [place] [address]    Report Status PENDING  Incomplete  Blood culture (routine x 2)     Status: None (Preliminary result)   Collection Time: 08/07/22  6:32 AM   Specimen: BLOOD  Result Value Ref Range Status   Specimen Description   Final    BLOOD Performed at [facility], [address] [place], [place] [address]    Special Requests   Final    NONE Performed at [facility], [address], [place], [place] [address]     Culture   Final    NO GROWTH 3 DAYS Performed at [facility], [place] [address], [place], [place] [address]    Report Status PENDING  Incomplete  Blood culture (routine x 2)     Status: None (Preliminary result)   Collection Time: 08/09/22  5:06 AM   Specimen: BLOOD  Result Value Ref Range Status   Specimen Description   Final    BLOOD RIGHT ANTECUBITAL Performed at [facility], [address] [place],  [place] [address]    Special Requests   Final    BOTTLES DRAWN AEROBIC AND ANAEROBIC Blood Culture adequate volume Performed at [facility], [address] [place], [place] [address]    Culture   Final    NO GROWTH < 24 HOURS Performed at [facility], [place] [address] [place], [place] [address]    Report Status PENDING  Incomplete  Resp Panel by RT-PCR (Flu A&B, Covid) Anterior Nasal Swab     Status: Abnormal   Collection Time: 08/09/22  5:48 AM   Specimen: Anterior Nasal Swab  Result Value Ref Range Status   SARS Coronavirus 2 by RT PCR POSITIVE (A) NEGATIVE Final    Comment: (NOTE) SARS-CoV-2 target nucleic acids are DETECTED.  The SARS-CoV-2 RNA is generally detectable in upper respiratory specimens during the acute phase of infection. Positive results are indicative of the presence of the identified virus, but do not rule out bacterial infection or co-infection with other pathogens not detected by the test. Clinical correlation with patient history and other diagnostic information is necessary to determine patient infection status. The expected result is Negative.  Fact Sheet for Patients: EntrepreneurPulse.com.au  Fact Sheet for Healthcare Providers: IncredibleEmployment.be  This test is not yet approved or cleared by the [place] FDA and  has been authorized for detection and/or diagnosis of SARS-CoV-2 by FDA under an Emergency Use Authorization (EUA).  This EUA will remain  in effect (meaning this test can be used) for the duration of  the COVID-19 declaration under Section 564(b)(1) of the A ct, 21 U.S.C. section 360bbb-3(b)(1), unless the authorization is terminated or revoked sooner.     Influenza A by PCR NEGATIVE NEGATIVE Final   Influenza B by PCR NEGATIVE NEGATIVE Final    Comment: (NOTE) The Xpert Xpress SARS-CoV-2/FLU/RSV plus assay is intended as an aid in the diagnosis of influenza from Nasopharyngeal swab specimens and should not be used as a sole basis for treatment. Nasal washings and aspirates are unacceptable for Xpert Xpress SARS-CoV-2/FLU/RSV testing.  Fact Sheet for Patients: EntrepreneurPulse.com.au  Fact Sheet for Healthcare Providers: IncredibleEmployment.be  This test is not yet approved or cleared by the [place] FDA and has been authorized for detection and/or diagnosis of SARS-CoV-2 by FDA under an Emergency Use Authorization (EUA). This EUA will remain in effect (meaning this test can be used) for the duration of the COVID-19 declaration under Section 564(b)(1) of the Act, 21 U.S.C. section 360bbb-3(b)(1), unless the authorization is terminated or revoked.  Performed at [facility], [address], [place], [place] [address]   Blood culture (routine x 2)     Status: None (Preliminary result)   Collection Time: 08/09/22  6:01 AM   Specimen: BLOOD  Result Value Ref Range Status   Specimen Description   Final    BLOOD RIGHT HAND Performed at [facility], [address], [place], [place] [address]    Special Requests   Final    BOTTLES DRAWN AEROBIC AND ANAEROBIC Blood Culture adequate volume Performed at [facility], [address] [place], [place] [address]    Culture   Final    NO GROWTH < 24 HOURS Performed at [facility], [place] [address] [place], [place] [address]    Report Status PENDING  Incomplete     Renal Function: Recent Labs    08/06/22 0749 08/07/22 0412 08/09/22 0506 08/10/22 0423  CREATININE 0.51 0.60 0.58 0.57   Estimated Creatinine Clearance:  80.9 mL/min (by C-G formula based on SCr of 0.57 mg/dL).  Radiologic Imaging: DG HIP UNILAT WITH PELVIS 2-3 VIEWS RIGHT  Result Date: 08/10/2022 CLINICAL DATA:  Pain EXAM: DG HIP (WITH OR WITHOUT PELVIS) 3V RIGHT COMPARISON:  None Available. FINDINGS: There is no evidence of hip fracture or dislocation. 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 There are lumbosacral degenerative changes. Pelvic ring is intact. No osteolytic or osteoblastic changes identified. IMPRESSION: Degenerative changes.  No acute osseous abnormalities identified. Electronically Signed   By: Sammie Bench M.D.   On: 08/10/2022 09:39   MR ABDOMEN W WO CONTRAST  Result Date: 08/10/2022 CLINICAL DATA:  Left renal lesion in a patient with history of UTI. EXAM: MRI ABDOMEN WITHOUT AND WITH CONTRAST TECHNIQUE: Multiplanar multisequence MR imaging of the abdomen was performed both before and after the administration of intravenous contrast. CONTRAST:  7.52m GADAVIST GADOBUTROL 1 MMOL/ML IV SOLN COMPARISON:  CT stone study 08/09/2022 FINDINGS: Lower chest: Unremarkable. Hepatobiliary: Scattered tiny T2 hyperintense nonenhancing foci in the liver parenchyma are compatible with cysts. No followup imaging is recommended. Gallbladder surgically absent. No intrahepatic or extrahepatic biliary dilation. Pancreas: No focal mass lesion. No dilatation of the main duct. No intraparenchymal cyst. No peripancreatic edema. Spleen:  No splenomegaly. No focal mass lesion. Adrenals/Urinary Tract: No adrenal nodule or mass. Tiny cortical cyst noted right kidney. No followup imaging is recommended. 1.7 x 2.2 x 1.8 cm lesion in the posterior interpolar left kidney shows heterogeneous signal intensity on T2 imaging. Signal intensity on precontrast T1 imaging is nearly  isointense to background kidney with some peripheral T1 shortening. Imaging after IV contrast administration shows peripheral rim enhancement. The lesion demonstrates restricted diffusion and on postcontrast imaging is relatively well-defined. The lesion measured 3.2 x 2.9 cm on CT scan of 08/09/2022 and although subtle, was present on a CT scan of 08/06/2022 when it measured 1.5 x 1.5 cm. This lesion was visible on a CT scan from 03/29/2022 when it measured 1.4 x 1.2 cm. Other additional small cortical cysts are noted in the left kidney. There is no hydronephrosis. Stomach/Bowel: Stomach is unremarkable. No gastric wall thickening. No evidence of outlet obstruction. Duodenum is normally positioned as is the ligament of Treitz. No small bowel or colonic dilatation within the visualized abdomen. Vascular/Lymphatic: No abdominal aortic aneurysm. No abdominal lymphadenopathy Other:  No intraperitoneal free fluid. Musculoskeletal: No focal suspicious marrow enhancement within the visualized bony anatomy. IMPRESSION: 1. 1.7 x 2.2 x 1.8 cm lesion in the posterior interpolar left kidney shows heterogeneous signal intensity on T2 imaging with peripheral rim enhancement and restricted diffusion. This lesion has been present since at least 03/29/2022 with interval increase in size between 08/06/2022 and 08/09/2022. Heterogeneous signal intensity noted on today's study with restricted diffusion. Given that this was present previously and has enlarged recently, superinfection of a previously existing cyst would be a consideration. Interval hemorrhage into a previous cyst would also be a consideration. Cystic neoplasm is considered unlikely based on prior imaging but is not entirely excluded. 2. Other small cortical cysts in both kidneys compatible with tiny simple cyst. No followup imaging is recommended. Electronically Signed   By: EMisty StanleyM.D.   On: 08/10/2022 08:40   CT Renal Stone Study  Result Date:  08/09/2022 CLINICAL DATA:  Flank pain and hip pain EXAM: CT ABDOMEN AND PELVIS WITHOUT CONTRAST TECHNIQUE: Multidetector CT imaging of the abdomen and pelvis was performed following the standard protocol without IV contrast. RADIATION DOSE  REDUCTION: This exam was performed according to the departmental dose-optimization program which includes automated exposure control, adjustment of the mA and/or kV according to patient size and/or use of iterative reconstruction technique. COMPARISON:  CT abdomen and pelvis dated December 5th 2023 FINDINGS: Lower chest: Trace pericardial and pleural effusions. Small hiatal hernia. No acute abnormality. Hepatobiliary: No focal liver abnormality is seen. Status post cholecystectomy. No biliary dilatation. Pancreas: Unremarkable. No pancreatic ductal dilatation or surrounding inflammatory changes. Spleen: Normal in size without focal abnormality. Adrenals/Urinary Tract: Bilateral adrenal glands are unremarkable. No hydronephrosis. Rim enhancing lesion of the lower pole of the left kidney measuring 3.1 x 2.9 cm on series 2, image 36. Not visible on prior exam due to lack of IV contrast. Bladder is unremarkable. Stomach/Bowel: Stomach is within normal limits. Appendix appears normal. No evidence of bowel wall thickening, distention, or inflammatory changes. Vascular/Lymphatic: Aortic atherosclerosis. No enlarged abdominal or pelvic lymph nodes. Reproductive: Uterus and bilateral adnexa are unremarkable. Other: No abdominal wall hernia or abnormality. No abdominopelvic ascites. Musculoskeletal: No acute or significant osseous findings. IMPRESSION: 1. Lesion of the lower pole of the left kidney measuring up to 3.1 cm with rim enhancement secondary to excreted contrast from prior PE study. Favored to be renal abscess, although cystic renal mass is also a concern. Recommend contrast-enhanced abdominal MRI for better evaluation. 2. Trace pericardial and pleural effusions. 3. Aortic  Atherosclerosis (ICD10-I70.0). Electronically Signed   By: Yetta Glassman M.D.   On: 08/09/2022 15:41   CT Angio Chest PE W and/or Wo Contrast  Result Date: 08/09/2022 CLINICAL DATA:  Shortness of breath and hypoxia. EXAM: CT ANGIOGRAPHY CHEST WITH CONTRAST TECHNIQUE: Multidetector CT imaging of the chest was performed using the standard protocol during bolus administration of intravenous contrast. Multiplanar CT image reconstructions and MIPs were obtained to evaluate the vascular anatomy. RADIATION DOSE REDUCTION: This exam was performed according to the departmental dose-optimization program which includes automated exposure control, adjustment of the mA and/or kV according to patient size and/or use of iterative reconstruction technique. CONTRAST:  147m OMNIPAQUE IOHEXOL 350 MG/ML SOLN COMPARISON:  09/14/2003 chest CT. FINDINGS: Cardiovascular: Cardiomegaly without substantial pericardial effusion. Coronary artery calcification is evident. Mild atherosclerotic calcification is noted in the wall of the thoracic aorta. There is no filling defect within the opacified pulmonary arteries to suggest the presence of an acute pulmonary embolus. Mediastinum/Nodes: No mediastinal lymphadenopathy. There is no hilar lymphadenopathy. The esophagus has normal imaging features. There is no axillary lymphadenopathy. Lungs/Pleura: No focal airspace consolidation. Scattered areas of mosaic attenuation in the lung parenchyma is nonspecific but may be related to air trapping/small airways disease. Subtle interlobular septal thickening in the upper lungs evident. No overt airspace pulmonary edema tiny bilateral pleural effusions evident. Upper Abdomen: Liver is enlarged. The liver shows diffusely decreased attenuation suggesting fat deposition. Musculoskeletal: No worrisome lytic or sclerotic osseous abnormality. Review of the MIP images confirms the above findings. IMPRESSION: 1. No CT evidence for acute pulmonary embolus.  2. Scattered areas of mosaic attenuation in the lung parenchyma is nonspecific but may be related to air trapping/small airways disease. Component of mild pulmonary edema not excluded. 3. Tiny bilateral pleural effusions. 4. Hepatomegaly with hepatic steatosis. 5.  Aortic Atherosclerosis (ICD10-I70.0). Electronically Signed   By: EMisty StanleyM.D.   On: 08/09/2022 07:13   DG Chest Port 1 View  Result Date: 08/09/2022 CLINICAL DATA:  61year old female with flank pain, UTI, vomiting, tachycardia, hypoxia. EXAM: PORTABLE CHEST 1 VIEW COMPARISON:  Chest radiographs 11/05/2020  and earlier. FINDINGS: Portable AP view at 0534 hours. Mildly lower lung volumes. Normal cardiac size and mediastinal contours. Visualized tracheal air column is within normal limits. Allowing for portable technique the lungs are clear. Paucity of bowel gas in the visible abdomen. Negative visible osseous structures. IMPRESSION: Negative portable chest. Electronically Signed   By: Genevie Ann M.D.   On: 08/09/2022 05:51    I independently reviewed the above imaging studies.  Impression/Recommendation: 1) Left renal lesion/UTI:  Her left renal lesion is most likely infectious based on the short-term interval growth over 3 days.  Would begin by treating with lipophilic antibiotic therapy (ciprofloxacin or TMP/SMX) based on cultures to increase chance of cyst wall penetration.  Will repeat imaging in about 72 hrs or clinically worsening with plans to consider interventional radiology involvement for percutaneous drainage if not resolving with antibiotic therapy alone.  Dutch Gray 08/10/2022, 2:51 PM  Pryor Curia. MD   CC: Dr. Eulogio Bear

## 2022-08-10 NOTE — Evaluation (Signed)
Occupational Therapy Evaluation Patient Details Name: Courtney Parks MRN: [id number] DOB: 07/02/1961 Today's Date: 08/10/2022   History of Present Illness Pt is a 61 y/o F presenting to ED on 12/5 with urinary frequency and R back pain, dx with UTI, and d/c'd. Returned to ED on 12/6 with vomiting and R hip pain. CT revealing L complex renal cyst concerning for infectious cyst/abscess. PMH includes DM, RLE foot wound s/p I & D (04/2022), HLD, HTN, and anxiety/depression   Clinical Impression   Pt reports independence at baseline with ADLs, uses w/c primarily for mobility since foot surgery in August 2023. Lives with son who can provide PRN assist. Pt currently needing set up - min A for ADLs, mod I for bed mobility, and supervision for stand pivot transfer to bed <> BSC. Pt able to verbalize how she has been managing from w/c level at home to perform ADLs/IADLs. Pt presenting with impairments listed below, will follow acutely. Anticipate no OT follow up needs at d/c pending progression.      Recommendations for follow up therapy are one component of a multi-disciplinary discharge planning process, led by the attending physician.  Recommendations may be updated based on patient status, additional functional criteria and insurance authorization.   Follow Up Recommendations  No OT follow up     Assistance Recommended at Discharge Set up Supervision/Assistance  Patient can return home with the following A little help with walking and/or transfers;A little help with bathing/dressing/bathroom;Assistance with cooking/housework;Assist for transportation;Help with stairs or ramp for entrance    Functional Status Assessment  Patient has had a recent decline in their functional status and demonstrates the ability to make significant improvements in function in a reasonable and predictable amount of time.  Equipment Recommendations  None recommended by OT (pt has all needed DME)     Recommendations for Other Services PT consult     Precautions / Restrictions Precautions Precautions: Fall Precaution Comments: COVID+ Restrictions Weight Bearing Restrictions: Yes RLE Weight Bearing: Non weight bearing      Mobility Bed Mobility Overal bed mobility: Modified Independent             General bed mobility comments: mod I for bed mobility    Transfers Overall transfer level: Needs assistance   Transfers: Bed to chair/wheelchair/BSC, Sit to/from Stand Sit to Stand: Supervision Stand pivot transfers: Supervision         General transfer comment: with BSC directly in front of  pt, 180* transfer      Balance Overall balance assessment: Needs assistance Sitting-balance support: Feet supported Sitting balance-Leahy Scale: Good     Standing balance support: Reliant on assistive device for balance, During functional activity Standing balance-Leahy Scale: Zero                             ADL either performed or assessed with clinical judgement   ADL Overall ADL's : Needs assistance/impaired Eating/Feeding: Set up   Grooming: Set up   Upper Body Bathing: Minimal assistance   Lower Body Bathing: Minimal assistance   Upper Body Dressing : Minimal assistance   Lower Body Dressing: Minimal assistance   Toilet Transfer: Minimal assistance   Toileting- Clothing Manipulation and Hygiene: Minimal assistance       Functional mobility during ADLs: Minimal assistance       Vision   Vision Assessment?: No apparent visual deficits     Perception Perception Perception Tested?: No   Praxis  Praxis Praxis tested?: Not tested    Pertinent Vitals/Pain Pain Assessment Pain Assessment: No/denies pain     Hand Dominance Right   Extremity/Trunk Assessment Upper Extremity Assessment Upper Extremity Assessment: Overall WFL for tasks assessed   Lower Extremity Assessment Lower Extremity Assessment: Defer to PT evaluation    Cervical / Trunk Assessment Cervical / Trunk Assessment: Normal   Communication Communication Communication: No difficulties   Cognition Arousal/Alertness: Awake/alert Behavior During Therapy: WFL for tasks assessed/performed Overall Cognitive Status: Within Functional Limits for tasks assessed                                       General Comments  VSS on supplemental O2    Exercises     Shoulder Instructions      Home Living Family/patient expects to be discharged to:: Private residence Living Arrangements: Children Available Help at Discharge: Family Type of Home: House Home Access: Stairs to enter Technical brewer of Steps: 2 Entrance Stairs-Rails: Right;Left;Can reach both Home Layout: One level     Bathroom Shower/Tub: Teacher, early years/pre: Standard     Home Equipment: Civil engineer, contracting;Wheelchair - manual;BSC/3in1;Crutches;Other (comment);Hospital bed;Adaptive equipment;Rolling Walker (2 wheels) (bicycle scooter) Adaptive Equipment: Reacher Additional Comments: does not wear O2 at home      Prior Functioning/Environment Prior Level of Function : Needs assist             Mobility Comments: son bumps her up the stairs in w/c, primarily w/c level within the home, can transfer independently ADLs Comments: ind with ADLs/IADLs        OT Problem List: Decreased strength;Decreased range of motion;Decreased activity tolerance;Impaired balance (sitting and/or standing);Decreased safety awareness      OT Treatment/Interventions: Self-care/ADL training;Therapeutic exercise;Energy conservation;DME and/or AE instruction;Therapeutic activities;Patient/family education;Balance training    OT Goals(Current goals can be found in the care plan section) Acute Rehab OT Goals Patient Stated Goal: none stated OT Goal Formulation: With patient Time For Goal Achievement: 08/24/22 Potential to Achieve Goals: Good ADL Goals Pt Will Transfer  to Toilet: Independently;squat pivot transfer;stand pivot transfer;bedside commode Pt Will Perform Tub/Shower Transfer: Tub transfer;Shower transfer;Independently;Stand pivot transfer;Squat pivot transfer;shower seat;tub bench  OT Frequency: Min 2X/week    Co-evaluation              AM-PAC OT "6 Clicks" Daily Activity     Outcome Measure Help from another person eating meals?: None Help from another person taking care of personal grooming?: None Help from another person toileting, which includes using toliet, bedpan, or urinal?: A Little Help from another person bathing (including washing, rinsing, drying)?: A Little Help from another person to put on and taking off regular upper body clothing?: A Little Help from another person to put on and taking off regular lower body clothing?: A Little 6 Click Score: 20   End of Session Equipment Utilized During Treatment: Oxygen Nurse Communication: Mobility status  Activity Tolerance: Patient tolerated treatment well Patient left: in bed;with call bell/phone within reach;with bed alarm set  OT Visit Diagnosis: Unsteadiness on feet (R26.81);Other abnormalities of gait and mobility (R26.89);Muscle weakness (generalized) (M62.81)                Time: 4967-5916 OT Time Calculation (min): 19 min Charges:  OT General Charges $OT Visit: 1 Visit OT Evaluation $OT Eval Low Complexity: 1 Low  Courtney Parks, OTD, OTR/L SecureChat Preferred Acute Rehab ([phone])  [place] 08/10/2022, 4:26 PM

## 2022-08-10 NOTE — Progress Notes (Signed)
Physical Therapy Evaluation Patient Details Name: Courtney Parks MRN: [id number] DOB: 10/26/1960 Today's Date: 08/10/2022  History of Present Illness  Pt is a 61 y/o F presenting to ED on 12/5 with urinary frequency and R back pain, dx with UTI, and d/c'd. Returned to ED on 12/6 with vomiting and R hip pain. CT revealing L complex renal cyst concerning for infectious cyst/abscess. PMHi ncludes DM, RLE foot wound s/p I & D 04/2022, HLD, HTN, and anxiety/depression  Clinical Impression  Pt was seen for mobility on side of bed and noted her BSC was so light that the basin was easily knocked down and spilled.  Requested a bariatric BSC to give her a safer surface to transfer, and recommended therapy to work on standing balance skills on LLE with ue support.  Follow along with her as pt is soon returning to [place] and should be able to hopefully regain her WB status on R foot.  In the meantime will progress with therapy goals as ordered on POC below.     Recommendations for follow up therapy are one component of a multi-disciplinary discharge planning process, led by the attending physician.  Recommendations may be updated based on patient status, additional functional criteria and insurance authorization.  Follow Up Recommendations No PT follow up      Assistance Recommended at Discharge Intermittent Supervision/Assistance  Patient can return home with the following  A little help with walking and/or transfers;A little help with bathing/dressing/bathroom;Assistance with cooking/housework;Assist for transportation;Help with stairs or ramp for entrance    Equipment Recommendations None recommended by PT  Recommendations for Other Services       Functional Status Assessment Patient has had a recent decline in their functional status and demonstrates the ability to make significant improvements in function in a reasonable and predictable amount of time.     Precautions / Restrictions  Precautions Precautions: Fall Precaution Comments: COVID+ Restrictions Weight Bearing Restrictions: Yes RLE Weight Bearing: Non weight bearing      Mobility  Bed Mobility Overal bed mobility: Modified Independent                  Transfers Overall transfer level: Needs assistance Equipment used: None Transfers: Bed to chair/wheelchair/BSC, Sit to/from Stand Sit to Stand: Supervision Stand pivot transfers: Supervision         General transfer comment: BSC is light weight and requires supervised help    Ambulation/Gait               General Gait Details: unable  Stairs            Wheelchair Mobility    Modified Rankin (Stroke Patients Only)       Balance Overall balance assessment: Needs assistance Sitting-balance support: Feet supported Sitting balance-Leahy Scale: Good       Standing balance-Leahy Scale:  (Declines to attempt)                               Pertinent Vitals/Pain Pain Assessment Pain Assessment: No/denies pain    Home Living Family/patient expects to be discharged to:: Private residence Living Arrangements: Children Available Help at Discharge: Family Type of Home: House Home Access: Stairs to enter Entrance Stairs-Rails: Right;Left;Can reach both Entrance Stairs-Number of Steps: 2   Home Layout: One level Home Equipment: Shower seat;Wheelchair - manual;BSC/3in1;Crutches;Other (comment);Hospital bed;Adaptive equipment;Rolling Walker (2 wheels) Additional Comments: new O2 use    Prior Function Prior Level of  Function : Needs assist       Physical Assist : Mobility (physical) Mobility (physical): Transfers   Mobility Comments: son helps with WC for home, to get up steps and she is wc level for mobility       Hand Dominance   Dominant Hand: Right    Extremity/Trunk Assessment   Upper Extremity Assessment Upper Extremity Assessment: Defer to OT evaluation    Lower Extremity  Assessment Lower Extremity Assessment: RLE deficits/detail RLE Deficits / Details: NWB on R foot wound RLE Coordination: decreased gross motor    Cervical / Trunk Assessment Cervical / Trunk Assessment: Normal  Communication   Communication: No difficulties  Cognition Arousal/Alertness: Awake/alert Behavior During Therapy: WFL for tasks assessed/performed Overall Cognitive Status: Within Functional Limits for tasks assessed                                          General Comments General comments (skin integrity, edema, etc.): stable O2 sats in room    Exercises     Assessment/Plan    PT Assessment Patient needs continued PT services  PT Problem List Decreased activity tolerance;Decreased balance;Decreased coordination;Decreased knowledge of use of DME;Decreased skin integrity       PT Treatment Interventions DME instruction;Functional mobility training;Therapeutic activities;Therapeutic exercise;Balance training;Neuromuscular re-education;Patient/family education    PT Goals (Current goals can be found in the Care Plan section)  Acute Rehab PT Goals Patient Stated Goal: to go home and recover from foot injury PT Goal Formulation: With patient Time For Goal Achievement: 08/17/22 Potential to Achieve Goals: Good    Frequency Min 3X/week     Co-evaluation               AM-PAC PT "6 Clicks" Mobility  Outcome Measure Help needed turning from your back to your side while in a flat bed without using bedrails?: None Help needed moving from lying on your back to sitting on the side of a flat bed without using bedrails?: A Little Help needed moving to and from a bed to a chair (including a wheelchair)?: A Little Help needed standing up from a chair using your arms (e.g., wheelchair or bedside chair)?: A Little Help needed to walk in hospital room?: Total Help needed climbing 3-5 steps with a railing? : Total 6 Click Score: 15    End of Session  Equipment Utilized During Treatment: Gait belt Activity Tolerance: Patient tolerated treatment well Patient left: in bed;with call bell/phone within reach;with bed alarm set Nurse Communication: Mobility status PT Visit Diagnosis: Unsteadiness on feet (R26.81);Difficulty in walking, not elsewhere classified (R26.2)    Time: 1610-9604 PT Time Calculation (min) (ACUTE ONLY): 36 min   Charges:   PT Evaluation $PT Eval Moderate Complexity: 1 Mod PT Treatments $Therapeutic Activity: 8-22 mins       Ramond Dial 08/10/2022, 8:18 PM  Mee Hives, PT PhD Acute Rehab Dept. Number: [place] [facility] [place]

## 2022-08-10 NOTE — Progress Notes (Signed)
PROGRESS NOTE    Courtney Parks  PFX:[id number] DOB: December 13, 1960 DOA: 08/09/2022 PCP: Merryl Hacker, No    Brief Narrative:  Courtney Parks is a 61 y.o. female with medical history significant of DM, HTN, HLD, and depression/anxiety presenting with n/v.  She was seen in the ER on 12/5 with urinary frequency and treated with Rocephin -> Keflex.  She returned on 12/6 with n/v; she was rehydrated and again discharged home with Keflex.   She continued to have n/v and returned to [facility] last night.  She reports having severe R hip pain which she likens to kidney stone pain and childbirth.  She also had n/v, last this AM.  She had not noticed SOB but was on O2 in the ER on 12/6.  She began feeling SOB last night.  She has had 1 COVID vaccine and is not opposed to treatment.     She has a h/o R foot surgery in August and is fully non-weight bearing on that foot.   Assessment and Plan:  N/V, UTI- klebsiella -Patient with several days of n/v, diagnosed with UTI -N/V could be related to UTI and/or COVID (see below) -Zofran as needed -She did have mild hypotension in the ER and likely hypovolemia but has been bolused and appears minimally hypovolemic for now; will give gentle IVF infusion for another 500 cc and then stop   Acute respiratory failure with hypoxia due to COVID-19 PNA -Patient with presenting with SOB, hypoxia to 88% on RA -She does not have a usual home O2 requirement and is currently requiring 2L [place] O2 -COVID POSITIVE -The patient has comorbidities which may increase the risk for ARDS/MODS including:  HTN, DM -CXR with multifocal opacities which may be c/w COVID  -Will not treat with broad-spectrum antibiotics if procalcitonin <0.5 -Encourage mobilization/ambulation as much as possible -Patient was seen wearing full PPE including: gown, gloves, N95, and eye protection; donning and doffing was in compliance with current standards.   Renal mass-- ? Suspected small renal  abscess  -Incidental finding on imaging -She reports pain in her right hip but mass is on L -MRI done -urology to see- will change abx to cipro for better penetration and plan follow up imaging in 2-3 day  DM -A1c in 03/2022 was 10.3 - poor control -May utilize continuous glucose monitoring -hold Trulicity -Continue glargine -Cover with moderate-scale SSI    R hip pain- ? Arthritis  -Nothing obvious on CT -Pain control     DVT prophylaxis: enoxaparin (LOVENOX) injection 40 mg Start: 08/09/22 1830    Code Status: Full Code Family Communication: on phone  Disposition Plan:  Level of care: Med-Surg Status is: Inpatient Remains inpatient appropriate because: needs re-imaging in 2-3 days    Consultants:  urology   Subjective: No left sided pain  Objective: Vitals:   08/09/22 1734 08/09/22 2226 08/10/22 0612 08/10/22 0855  BP:  139/66 131/64 139/67  Pulse:  87 77 80  Resp:  '18 17 15  '$ Temp:  97.9 F (36.6 C) 98.2 F (36.8 C) 98.6 F (37 C)  TempSrc:  Oral  Oral  SpO2:  100% 100% 99%  Weight: 81.2 kg     Height: '5\' 7"'$  (1.702 m)       Intake/Output Summary (Last 24 hours) at 08/10/2022 1145 Last data filed at 08/09/2022 1700 Gross per 24 hour  Intake 0 ml  Output --  Net 0 ml   Filed Weights   08/09/22 1734  Weight: 81.2 kg  Examination:   General: Appearance:     Overweight female in no acute distress     Lungs:     Clear to auscultation bilaterally, respirations unlabored  Heart:    Normal heart rate. Normal rhythm. No murmurs, rubs, or gallops.    MS:   All extremities are intact.    Neurologic:   Awake, alert, oriented x 3. No apparent focal neurological           defect.        Data Reviewed: I have personally reviewed following labs and imaging studies  CBC: Recent Labs  Lab 08/06/22 0749 08/07/22 0412 08/09/22 0506 08/09/22 0513 08/10/22 0423  WBC 15.5* 16.6* 7.5  --  6.7  NEUTROABS 13.1*  --  6.9  --  5.5  HGB 14.7 13.8 12.2  12.6 11.1*  HCT 44.4 42.2 37.1 37.0 34.4*  MCV 83.9 85.1 85.5  --  85.4  PLT 189 158 95*  --  90*   Basic Metabolic Panel: Recent Labs  Lab 08/06/22 0749 08/07/22 0412 08/09/22 0506 08/09/22 0513 08/10/22 0423  NA 135 134* 133* 133* 135  K 4.4 3.6 3.6 3.5 3.9  CL 97* 100 97*  --  100  CO2 25 19* 20*  --  20*  GLUCOSE 198* 267* 242*  --  270*  BUN '13 16 19  '$ --  16  CREATININE 0.51 0.60 0.58  --  0.57  CALCIUM 10.0 9.0 9.0  --  9.6   GFR: Estimated Creatinine Clearance: 80.9 mL/min (by C-G formula based on SCr of 0.57 mg/dL). Liver Function Tests: Recent Labs  Lab 08/07/22 0412 08/10/22 0423  AST 31 23  ALT 29 30  ALKPHOS 61 110  BILITOT 1.5* 1.0  PROT 6.5 6.8  ALBUMIN 3.5 3.0*   Recent Labs  Lab 08/07/22 0412  LIPASE 27   No results for input(s): "AMMONIA" in the last 168 hours. Coagulation Profile: No results for input(s): "INR", "PROTIME" in the last 168 hours. Cardiac Enzymes: No results for input(s): "CKTOTAL", "CKMB", "CKMBINDEX", "TROPONINI" in the last 168 hours. BNP (last 3 results) No results for input(s): "PROBNP" in the last 8760 hours. HbA1C: No results for input(s): "HGBA1C" in the last 72 hours. CBG: Recent Labs  Lab 08/09/22 0504 08/09/22 1238 08/09/22 1648 08/09/22 2134 08/10/22 0856  GLUCAP 220* 196* 148* 222* 274*   Lipid Profile: No results for input(s): "CHOL", "HDL", "LDLCALC", "TRIG", "CHOLHDL", "LDLDIRECT" in the last 72 hours. Thyroid Function Tests: No results for input(s): "TSH", "T4TOTAL", "FREET4", "T3FREE", "THYROIDAB" in the last 72 hours. Anemia Panel: Recent Labs    08/09/22 1751  FERRITIN 327*   Sepsis Labs: Recent Labs  Lab 08/07/22 0535 08/09/22 0506 08/09/22 1751  PROCALCITON  --   --  8.54  LATICACIDVEN 1.8 1.6  --     Recent Results (from the past 240 hour(s))  Urine Culture     Status: Abnormal   Collection Time: 08/06/22  6:55 AM   Specimen: Urine, Clean Catch  Result Value Ref Range Status    Specimen Description   Final    URINE, CLEAN CATCH Performed at [facility], [address] [place], [place] [address]    Special Requests   Final    NONE Performed at [facility], [address], [place], [place] [address]    Culture >=100,000 COLONIES/mL KLEBSIELLA PNEUMONIAE (A)  Final   Report Status 08/08/2022 FINAL  Final   Organism ID, Bacteria KLEBSIELLA PNEUMONIAE (A)  Final      Susceptibility   Klebsiella pneumoniae - MIC*    AMPICILLIN >=32 RESISTANT Resistant     CEFAZOLIN <=4 SENSITIVE Sensitive     CEFEPIME <=0.12 SENSITIVE Sensitive     CEFTRIAXONE <=0.25 SENSITIVE Sensitive     CIPROFLOXACIN <=0.25 SENSITIVE Sensitive     GENTAMICIN <=1 SENSITIVE Sensitive     IMIPENEM <=0.25 SENSITIVE Sensitive     NITROFURANTOIN 64 INTERMEDIATE Intermediate     TRIMETH/SULFA <=20 SENSITIVE Sensitive     AMPICILLIN/SULBACTAM 16 INTERMEDIATE Intermediate     PIP/TAZO <=4 SENSITIVE Sensitive     * >=100,000 COLONIES/mL KLEBSIELLA PNEUMONIAE  Blood culture (routine x 2)     Status: None (Preliminary result)   Collection Time: 08/07/22  5:35 AM   Specimen: BLOOD  Result Value Ref Range Status   Specimen Description   Final    BLOOD RIGHT ANTECUBITAL Performed at [facility], [address] [place], [place] [address]    Special Requests   Final    BOTTLES DRAWN AEROBIC AND ANAEROBIC Blood Culture results may not be optimal due to an inadequate volume of blood received in culture bottles Performed at [facility], [address], [place], [place] [address]    Culture   Final    NO GROWTH 3 DAYS Performed at [facility], [place] [address], [place], [place] [address]    Report Status PENDING  Incomplete  Blood culture (routine x 2)     Status: None (Preliminary result)   Collection Time: 08/07/22  6:32 AM   Specimen: BLOOD  Result Value Ref Range Status   Specimen Description   Final     BLOOD Performed at [facility], [address] [place], [place] [address]    Special Requests   Final    NONE Performed at [facility], [address] [place], [place] [address]    Culture   Final    NO GROWTH 3 DAYS Performed at [facility], [place] [address], [place], [place] [address]    Report Status PENDING  Incomplete  Blood culture (routine x 2)     Status: None (Preliminary result)   Collection Time: 08/09/22  5:06 AM   Specimen: BLOOD  Result Value Ref Range Status   Specimen Description   Final    BLOOD RIGHT ANTECUBITAL Performed at [facility], [address], [place], [place] [address]    Special Requests   Final    BOTTLES DRAWN AEROBIC AND ANAEROBIC Blood Culture adequate volume Performed at [facility], [address] [place], [place] [address]    Culture   Final    NO GROWTH < 24 HOURS Performed at [facility], [place] [address], [place],  [address]    Report Status PENDING  Incomplete  Resp Panel by RT-PCR (Flu A&B, Covid) Anterior Nasal Swab     Status: Abnormal   Collection Time: 08/09/22  5:48 AM   Specimen: Anterior Nasal Swab  Result Value Ref Range Status   SARS Coronavirus 2 by RT PCR POSITIVE (A) NEGATIVE Final    Comment: (NOTE) SARS-CoV-2 target nucleic acids are DETECTED.  The SARS-CoV-2 RNA is generally detectable in upper respiratory specimens during the acute phase of infection. Positive results are indicative of the presence of the identified virus, but do not rule out bacterial infection or co-infection with other pathogens not detected by the test. Clinical correlation with patient history and other diagnostic  information is necessary to determine patient infection status. The expected result is Negative.  Fact Sheet for Patients: EntrepreneurPulse.com.au  Fact Sheet for Healthcare  Providers: IncredibleEmployment.be  This test is not yet approved or cleared by the [place] FDA and  has been authorized for detection and/or diagnosis of SARS-CoV-2 by FDA under an Emergency Use Authorization (EUA).  This EUA will remain in effect (meaning this test can be used) for the duration of  the COVID-19 declaration under Section 564(b)(1) of the A ct, 21 U.S.C. section 360bbb-3(b)(1), unless the authorization is terminated or revoked sooner.     Influenza A by PCR NEGATIVE NEGATIVE Final   Influenza B by PCR NEGATIVE NEGATIVE Final    Comment: (NOTE) The Xpert Xpress SARS-CoV-2/FLU/RSV plus assay is intended as an aid in the diagnosis of influenza from Nasopharyngeal swab specimens and should not be used as a sole basis for treatment. Nasal washings and aspirates are unacceptable for Xpert Xpress SARS-CoV-2/FLU/RSV testing.  Fact Sheet for Patients: EntrepreneurPulse.com.au  Fact Sheet for Healthcare Providers: IncredibleEmployment.be  This test is not yet approved or cleared by the [place] FDA and has been authorized for detection and/or diagnosis of SARS-CoV-2 by FDA under an Emergency Use Authorization (EUA). This EUA will remain in effect (meaning this test can be used) for the duration of the COVID-19 declaration under Section 564(b)(1) of the Act, 21 U.S.C. section 360bbb-3(b)(1), unless the authorization is terminated or revoked.  Performed at [facility], [address] [place], [place] [address]   Blood culture (routine x 2)     Status: None (Preliminary result)   Collection Time: 08/09/22  6:01 AM   Specimen: BLOOD  Result Value Ref Range Status   Specimen Description   Final    BLOOD RIGHT HAND Performed at [facility], [address], [place], [place] [address]    Special Requests   Final    BOTTLES DRAWN AEROBIC AND ANAEROBIC Blood Culture  adequate volume Performed at [facility], [address] [place], [place] [address]    Culture   Final    NO GROWTH < 24 HOURS Performed at [facility], [place] [address] [place], [place] [address]    Report Status PENDING  Incomplete         Radiology Studies: DG HIP UNILAT WITH PELVIS 2-3 VIEWS RIGHT  Result Date: 08/10/2022 CLINICAL DATA:  Pain EXAM: DG HIP (WITH OR WITHOUT PELVIS) 3V RIGHT COMPARISON:  None Available. FINDINGS: There is no evidence of hip fracture or dislocation. 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 There are lumbosacral degenerative changes. Pelvic ring is intact. No osteolytic or osteoblastic changes identified. IMPRESSION: Degenerative changes.  No acute osseous abnormalities identified. Electronically Signed   By: Sammie Bench M.D.   On: 08/10/2022 09:39   MR ABDOMEN W WO CONTRAST  Result Date: 08/10/2022 CLINICAL DATA:  Left renal lesion in a patient with history of UTI. EXAM: MRI ABDOMEN WITHOUT AND WITH CONTRAST TECHNIQUE: Multiplanar multisequence MR imaging of the abdomen was performed both before and after the administration of intravenous contrast. CONTRAST:  7.79m GADAVIST GADOBUTROL 1 MMOL/ML IV SOLN COMPARISON:  CT stone study 08/09/2022 FINDINGS: Lower chest: Unremarkable. Hepatobiliary: Scattered tiny T2 hyperintense nonenhancing foci in the liver parenchyma are compatible with cysts. No followup imaging is recommended. Gallbladder surgically absent. No intrahepatic or extrahepatic biliary dilation. Pancreas: No focal mass lesion. No dilatation of the main duct. No intraparenchymal cyst. No peripancreatic edema. Spleen:  No splenomegaly. No focal mass lesion. Adrenals/Urinary Tract: No adrenal nodule or mass. Tiny cortical cyst noted right kidney. No followup imaging is recommended. 1.7 x 2.2 x 1.8 cm lesion in the posterior interpolar left kidney shows heterogeneous signal intensity on T2 imaging.  Signal intensity on precontrast T1 imaging is nearly isointense to background kidney with some peripheral T1 shortening. Imaging after IV contrast administration shows peripheral rim enhancement. The lesion demonstrates restricted diffusion and on postcontrast imaging is relatively well-defined. The lesion measured 3.2 x 2.9 cm on CT scan of 08/09/2022 and although subtle, was present on a CT scan of 08/06/2022 when it measured 1.5 x 1.5 cm. This lesion was visible on a CT scan from 03/29/2022 when it measured 1.4 x 1.2 cm. Other additional small cortical cysts are noted in the left kidney. There is no hydronephrosis. Stomach/Bowel: Stomach is unremarkable. No gastric wall thickening. No evidence of outlet obstruction. Duodenum is normally positioned as is the ligament of Treitz. No small bowel or colonic dilatation within the visualized abdomen. Vascular/Lymphatic: No abdominal aortic aneurysm. No abdominal lymphadenopathy Other:  No intraperitoneal free fluid. Musculoskeletal: No focal suspicious marrow enhancement within the visualized bony anatomy. IMPRESSION: 1. 1.7 x 2.2 x 1.8 cm lesion in the posterior interpolar left kidney shows heterogeneous signal intensity on T2 imaging with peripheral rim enhancement and restricted diffusion. This lesion has been present since at least 03/29/2022 with interval increase in size between 08/06/2022 and 08/09/2022. Heterogeneous signal intensity noted on today's study with restricted diffusion. Given that this was present previously and has enlarged recently, superinfection of a previously existing cyst would be a consideration. Interval hemorrhage into a previous cyst would also be a consideration. Cystic neoplasm is considered unlikely based on prior imaging but is not entirely excluded. 2. Other small cortical cysts in both kidneys compatible with tiny simple cyst. No followup imaging is recommended. Electronically Signed   By: Misty Stanley M.D.   On: 08/10/2022 08:40    CT Renal Stone Study  Result Date: 08/09/2022 CLINICAL DATA:  Flank pain and hip pain EXAM: CT ABDOMEN AND PELVIS WITHOUT CONTRAST TECHNIQUE: Multidetector CT imaging of the abdomen and pelvis was performed following the standard protocol without IV contrast. RADIATION DOSE REDUCTION: This exam was performed according to the departmental dose-optimization program which includes automated exposure control, adjustment of the mA and/or kV according to patient size and/or use of iterative reconstruction technique. COMPARISON:  CT abdomen and pelvis dated December 5th 2023 FINDINGS: Lower chest: Trace pericardial and pleural effusions. Small hiatal hernia. No acute abnormality. Hepatobiliary: No focal liver abnormality is seen. Status post cholecystectomy. No biliary dilatation. Pancreas: Unremarkable. No pancreatic ductal dilatation or surrounding inflammatory changes. Spleen: Normal in size without focal abnormality. Adrenals/Urinary Tract: Bilateral adrenal glands are unremarkable. No hydronephrosis. Rim enhancing lesion of the lower pole of the left kidney measuring 3.1 x 2.9 cm on series 2, image 36. Not visible on prior exam due to lack of IV contrast. Bladder is unremarkable. Stomach/Bowel: Stomach is within normal limits. Appendix appears normal. No evidence of bowel wall thickening, distention, or inflammatory changes. Vascular/Lymphatic: Aortic atherosclerosis. No enlarged abdominal or pelvic lymph nodes. Reproductive: Uterus and bilateral adnexa are unremarkable. Other: No abdominal wall hernia or abnormality. No abdominopelvic ascites. Musculoskeletal: No acute or significant osseous findings. IMPRESSION: 1. Lesion of the lower pole of the left kidney measuring up to 3.1 cm with rim enhancement secondary to excreted contrast from prior PE study. Favored to be renal abscess, although cystic  renal mass is also a concern. Recommend contrast-enhanced abdominal MRI for better evaluation. 2. Trace pericardial  and pleural effusions. 3. Aortic Atherosclerosis (ICD10-I70.0). Electronically Signed   By: Yetta Glassman M.D.   On: 08/09/2022 15:41   CT Angio Chest PE W and/or Wo Contrast  Result Date: 08/09/2022 CLINICAL DATA:  Shortness of breath and hypoxia. EXAM: CT ANGIOGRAPHY CHEST WITH CONTRAST TECHNIQUE: Multidetector CT imaging of the chest was performed using the standard protocol during bolus administration of intravenous contrast. Multiplanar CT image reconstructions and MIPs were obtained to evaluate the vascular anatomy. RADIATION DOSE REDUCTION: This exam was performed according to the departmental dose-optimization program which includes automated exposure control, adjustment of the mA and/or kV according to patient size and/or use of iterative reconstruction technique. CONTRAST:  163m OMNIPAQUE IOHEXOL 350 MG/ML SOLN COMPARISON:  09/14/2003 chest CT. FINDINGS: Cardiovascular: Cardiomegaly without substantial pericardial effusion. Coronary artery calcification is evident. Mild atherosclerotic calcification is noted in the wall of the thoracic aorta. There is no filling defect within the opacified pulmonary arteries to suggest the presence of an acute pulmonary embolus. Mediastinum/Nodes: No mediastinal lymphadenopathy. There is no hilar lymphadenopathy. The esophagus has normal imaging features. There is no axillary lymphadenopathy. Lungs/Pleura: No focal airspace consolidation. Scattered areas of mosaic attenuation in the lung parenchyma is nonspecific but may be related to air trapping/small airways disease. Subtle interlobular septal thickening in the upper lungs evident. No overt airspace pulmonary edema tiny bilateral pleural effusions evident. Upper Abdomen: Liver is enlarged. The liver shows diffusely decreased attenuation suggesting fat deposition. Musculoskeletal: No worrisome lytic or sclerotic osseous abnormality. Review of the MIP images confirms the above findings. IMPRESSION: 1. No CT  evidence for acute pulmonary embolus. 2. Scattered areas of mosaic attenuation in the lung parenchyma is nonspecific but may be related to air trapping/small airways disease. Component of mild pulmonary edema not excluded. 3. Tiny bilateral pleural effusions. 4. Hepatomegaly with hepatic steatosis. 5.  Aortic Atherosclerosis (ICD10-I70.0). Electronically Signed   By: EMisty StanleyM.D.   On: 08/09/2022 07:13   DG Chest Port 1 View  Result Date: 08/09/2022 CLINICAL DATA:  61year old female with flank pain, UTI, vomiting, tachycardia, hypoxia. EXAM: PORTABLE CHEST 1 VIEW COMPARISON:  Chest radiographs 11/05/2020 and earlier. FINDINGS: Portable AP view at 0534 hours. Mildly lower lung volumes. Normal cardiac size and mediastinal contours. Visualized tracheal air column is within normal limits. Allowing for portable technique the lungs are clear. Paucity of bowel gas in the visible abdomen. Negative visible osseous structures. IMPRESSION: Negative portable chest. Electronically Signed   By: HGenevie AnnM.D.   On: 08/09/2022 05:51        Scheduled Meds:  vitamin C  500 mg Oral Daily   aspirin EC  81 mg Oral Daily   diclofenac Sodium  2 g Topical QID   docusate sodium  100 mg Oral BID   enoxaparin (LOVENOX) injection  40 mg Subcutaneous Q24H   insulin aspart  0-15 Units Subcutaneous TID WC   insulin aspart  0-5 Units Subcutaneous QHS   insulin glargine-yfgn  40 Units Subcutaneous Q1500   methylPREDNISolone (SOLU-MEDROL) injection  81.25 mg Intravenous Q24H   Followed by   [Derrill MemoON 08/12/2022] predniSONE  50 mg Oral Daily   nirmatrelvir/ritonavir EUA  3 tablet Oral BID   phenazopyridine  200 mg Oral TID   sodium chloride flush  3 mL Intravenous Q12H   zinc sulfate  220 mg Oral Daily   Continuous Infusions:  sodium chloride  ciprofloxacin       LOS: 1 day    Time spent: 45 minutes spent on chart review, discussion with nursing staff, consultants, updating family and interview/physical  exam; more than 50% of that time was spent in counseling and/or coordination of care.    Geradine Girt, DO [facility] Available via Epic secure chat 7am-7pm After these hours, please refer to coverage provider listed on [email or web address] 08/10/2022, 11:45 AM

## 2022-08-11 DIAGNOSIS — M25551 Pain in right hip: Secondary | ICD-10-CM

## 2022-08-11 DIAGNOSIS — N151 Renal and perinephric abscess: Secondary | ICD-10-CM | POA: Diagnosis not present

## 2022-08-11 DIAGNOSIS — U071 COVID-19: Secondary | ICD-10-CM | POA: Diagnosis not present

## 2022-08-11 LAB — CBC WITH DIFFERENTIAL/PLATELET
Abs Immature Granulocytes: 0 10*3/uL (ref 0.00–0.07)
Basophils Absolute: 0 10*3/uL (ref 0.0–0.1)
Basophils Relative: 0 %
Eosinophils Absolute: 0 10*3/uL (ref 0.0–0.5)
Eosinophils Relative: 0 %
HCT: 33 % — ABNORMAL LOW (ref 36.0–46.0)
Hemoglobin: 11 g/dL — ABNORMAL LOW (ref 12.0–15.0)
Lymphocytes Relative: 6 %
Lymphs Abs: 0.6 10*3/uL — ABNORMAL LOW (ref 0.7–4.0)
MCH: 27.7 pg (ref 26.0–34.0)
MCHC: 33.3 g/dL (ref 30.0–36.0)
MCV: 83.1 fL (ref 80.0–100.0)
Monocytes Absolute: 0.6 10*3/uL (ref 0.1–1.0)
Monocytes Relative: 6 %
Neutro Abs: 8.4 10*3/uL — ABNORMAL HIGH (ref 1.7–7.7)
Neutrophils Relative %: 88 %
Platelets: 98 10*3/uL — ABNORMAL LOW (ref 150–400)
RBC: 3.97 MIL/uL (ref 3.87–5.11)
RDW: 14.7 % (ref 11.5–15.5)
WBC: 9.5 10*3/uL (ref 4.0–10.5)
nRBC: 0 % (ref 0.0–0.2)
nRBC: 0 /100 WBC

## 2022-08-11 LAB — COMPREHENSIVE METABOLIC PANEL
ALT: 22 U/L (ref 0–44)
AST: 14 U/L — ABNORMAL LOW (ref 15–41)
Albumin: 2.4 g/dL — ABNORMAL LOW (ref 3.5–5.0)
Alkaline Phosphatase: 82 U/L (ref 38–126)
Anion gap: 12 (ref 5–15)
BUN: 21 mg/dL (ref 8–23)
CO2: 22 mmol/L (ref 22–32)
Calcium: 9.2 mg/dL (ref 8.9–10.3)
Chloride: 100 mmol/L (ref 98–111)
Creatinine, Ser: 0.5 mg/dL (ref 0.44–1.00)
GFR, Estimated: >60 mL/min (ref >60)
Glucose, Bld: 299 mg/dL — ABNORMAL HIGH (ref 70–99)
Potassium: 4.2 mmol/L (ref 3.5–5.1)
Sodium: 134 mmol/L — ABNORMAL LOW (ref 135–145)
Total Bilirubin: 0.8 mg/dL (ref 0.3–1.2)
Total Protein: 5.8 g/dL — ABNORMAL LOW (ref 6.5–8.1)

## 2022-08-11 LAB — GLUCOSE, CAPILLARY
Glucose-Capillary: 326 mg/dL — ABNORMAL HIGH (ref 70–99)
Glucose-Capillary: 307 mg/dL — ABNORMAL HIGH (ref 70–99)
Glucose-Capillary: 230 mg/dL — ABNORMAL HIGH (ref 70–99)
Glucose-Capillary: 274 mg/dL — ABNORMAL HIGH (ref 70–99)

## 2022-08-11 MED ORDER — INSULIN ASPART 100 UNIT/ML IJ SOLN
0.0000 [IU] | Freq: Every day | INTRAMUSCULAR | Status: DC
  Administered 2022-08-11 – 2022-08-13 (×3): 3 [IU] via SUBCUTANEOUS

## 2022-08-11 MED ORDER — DIPHENHYDRAMINE HCL 50 MG/ML IJ SOLN
50.0000 mg | Freq: Once | INTRAMUSCULAR | Status: AC
  Administered 2022-08-12: 50 mg via INTRAVENOUS
  Filled 2022-08-11: qty 1

## 2022-08-11 MED ORDER — INSULIN ASPART 100 UNIT/ML IJ SOLN
0.0000 [IU] | Freq: Three times a day (TID) | INTRAMUSCULAR | Status: DC
  Administered 2022-08-11 (×2): 15 [IU] via SUBCUTANEOUS
  Administered 2022-08-12: 20 [IU] via SUBCUTANEOUS
  Administered 2022-08-12: 11 [IU] via SUBCUTANEOUS
  Administered 2022-08-13: 4 [IU] via SUBCUTANEOUS

## 2022-08-11 NOTE — Progress Notes (Signed)
Patient ID: Courtney Parks, female   DOB: 1960-12-04, 61 y.o.   MRN: [id number]     Subjective: Willine says her lower urinary tract symptoms are improved.  Still no flank pain or fever.  Objective: Vital signs in last 24 hours: Temp:  [98.7 F (37.1 C)-98.9 F (37.2 C)] 98.9 F (37.2 C) (12/10 0548) Pulse Rate:  [77-82] 82 (12/10 0548) Resp:  [18] 18 (12/10 0548) BP: (137-140)/(60-68) 140/68 (12/10 0548) SpO2:  [97 %-98 %] 98 % (12/10 0548)  Intake/Output from previous day: 12/09 0701 - 12/10 0700 In: -  Out: 400 [Urine:400] Intake/Output this shift: No intake/output data recorded.  Physical Exam:  General: Alert and oriented Abd: Mild left CVAT  Lab Results: Recent Labs    08/09/22 0513 08/10/22 0423 08/11/22 0342  HGB 12.6 11.1* 11.0*  HCT 37.0 34.4* 33.0*      Latest Ref Rng & Units 08/11/2022    3:42 AM 08/10/2022    4:23 AM 08/09/2022    5:13 AM  CBC  WBC 4.0 - 10.5 K/uL 9.5  6.7    Hemoglobin 12.0 - 15.0 g/dL 11.0  11.1  12.6   Hematocrit 36.0 - 46.0 % 33.0  34.4  37.0   Platelets 150 - 400 K/uL 98  90         BMET Recent Labs    08/10/22 0423 08/11/22 0342  NA 135 134*  K 3.9 4.2  CL 100 100  CO2 20* 22  GLUCOSE 270* 299*  BUN 16 21  CREATININE 0.57 0.50  CALCIUM 9.6 9.2     Studies/Results: DG HIP UNILAT WITH PELVIS 2-3 VIEWS RIGHT  Result Date: 08/10/2022 CLINICAL DATA:  Pain EXAM: DG HIP (WITH OR WITHOUT PELVIS) 3V RIGHT COMPARISON:  None Available. FINDINGS: There is no evidence of hip fracture or dislocation. 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 There are lumbosacral degenerative changes. Pelvic ring is intact. No osteolytic or osteoblastic changes identified. IMPRESSION: Degenerative changes.  No acute osseous abnormalities identified. Electronically Signed   By: Sammie Bench M.D.   On: 08/10/2022 09:39   MR ABDOMEN W WO CONTRAST  Result Date: 08/10/2022 CLINICAL DATA:  Left renal lesion  in a patient with history of UTI. EXAM: MRI ABDOMEN WITHOUT AND WITH CONTRAST TECHNIQUE: Multiplanar multisequence MR imaging of the abdomen was performed both before and after the administration of intravenous contrast. CONTRAST:  7.18m GADAVIST GADOBUTROL 1 MMOL/ML IV SOLN COMPARISON:  CT stone study 08/09/2022 FINDINGS: Lower chest: Unremarkable. Hepatobiliary: Scattered tiny T2 hyperintense nonenhancing foci in the liver parenchyma are compatible with cysts. No followup imaging is recommended. Gallbladder surgically absent. No intrahepatic or extrahepatic biliary dilation. Pancreas: No focal mass lesion. No dilatation of the main duct. No intraparenchymal cyst. No peripancreatic edema. Spleen:  No splenomegaly. No focal mass lesion. Adrenals/Urinary Tract: No adrenal nodule or mass. Tiny cortical cyst noted right kidney. No followup imaging is recommended. 1.7 x 2.2 x 1.8 cm lesion in the posterior interpolar left kidney shows heterogeneous signal intensity on T2 imaging. Signal intensity on precontrast T1 imaging is nearly isointense to background kidney with some peripheral T1 shortening. Imaging after IV contrast administration shows peripheral rim enhancement. The lesion demonstrates restricted diffusion and on postcontrast imaging is relatively well-defined. The lesion measured 3.2 x 2.9 cm on CT scan of 08/09/2022 and although subtle, was present on a CT scan of 08/06/2022 when it measured 1.5 x 1.5 cm. This lesion was visible on a CT scan from  03/29/2022 when it measured 1.4 x 1.2 cm. Other additional small cortical cysts are noted in the left kidney. There is no hydronephrosis. Stomach/Bowel: Stomach is unremarkable. No gastric wall thickening. No evidence of outlet obstruction. Duodenum is normally positioned as is the ligament of Treitz. No small bowel or colonic dilatation within the visualized abdomen. Vascular/Lymphatic: No abdominal aortic aneurysm. No abdominal lymphadenopathy Other:  No  intraperitoneal free fluid. Musculoskeletal: No focal suspicious marrow enhancement within the visualized bony anatomy. IMPRESSION: 1. 1.7 x 2.2 x 1.8 cm lesion in the posterior interpolar left kidney shows heterogeneous signal intensity on T2 imaging with peripheral rim enhancement and restricted diffusion. This lesion has been present since at least 03/29/2022 with interval increase in size between 08/06/2022 and 08/09/2022. Heterogeneous signal intensity noted on today's study with restricted diffusion. Given that this was present previously and has enlarged recently, superinfection of a previously existing cyst would be a consideration. Interval hemorrhage into a previous cyst would also be a consideration. Cystic neoplasm is considered unlikely based on prior imaging but is not entirely excluded. 2. Other small cortical cysts in both kidneys compatible with tiny simple cyst. No followup imaging is recommended. Electronically Signed   By: Misty Stanley M.D.   On: 08/10/2022 08:40   CT Renal Stone Study  Result Date: 08/09/2022 CLINICAL DATA:  Flank pain and hip pain EXAM: CT ABDOMEN AND PELVIS WITHOUT CONTRAST TECHNIQUE: Multidetector CT imaging of the abdomen and pelvis was performed following the standard protocol without IV contrast. RADIATION DOSE REDUCTION: This exam was performed according to the departmental dose-optimization program which includes automated exposure control, adjustment of the mA and/or kV according to patient size and/or use of iterative reconstruction technique. COMPARISON:  CT abdomen and pelvis dated December 5th 2023 FINDINGS: Lower chest: Trace pericardial and pleural effusions. Small hiatal hernia. No acute abnormality. Hepatobiliary: No focal liver abnormality is seen. Status post cholecystectomy. No biliary dilatation. Pancreas: Unremarkable. No pancreatic ductal dilatation or surrounding inflammatory changes. Spleen: Normal in size without focal abnormality. Adrenals/Urinary  Tract: Bilateral adrenal glands are unremarkable. No hydronephrosis. Rim enhancing lesion of the lower pole of the left kidney measuring 3.1 x 2.9 cm on series 2, image 36. Not visible on prior exam due to lack of IV contrast. Bladder is unremarkable. Stomach/Bowel: Stomach is within normal limits. Appendix appears normal. No evidence of bowel wall thickening, distention, or inflammatory changes. Vascular/Lymphatic: Aortic atherosclerosis. No enlarged abdominal or pelvic lymph nodes. Reproductive: Uterus and bilateral adnexa are unremarkable. Other: No abdominal wall hernia or abnormality. No abdominopelvic ascites. Musculoskeletal: No acute or significant osseous findings. IMPRESSION: 1. Lesion of the lower pole of the left kidney measuring up to 3.1 cm with rim enhancement secondary to excreted contrast from prior PE study. Favored to be renal abscess, although cystic renal mass is also a concern. Recommend contrast-enhanced abdominal MRI for better evaluation. 2. Trace pericardial and pleural effusions. 3. Aortic Atherosclerosis (ICD10-I70.0). Electronically Signed   By: Yetta Glassman M.D.   On: 08/09/2022 15:41    Assessment/Plan: 1) Left renal lesion/UTI:  She is clinically stable.  Will plan for CT of the abdomen with and without IV contrast tomorrow for further evaluation and to compare to MRI 3 days ago.  She has a shellfish allergy but has tolerated CT contrast previously.  Will premedicate with diphenhydramine as a precaution.  Will then discuss findings with Interventional Radiology to to discuss benefits or aspiration/drainage if lesion is not improving.   LOS: 2 days  Dutch Gray 08/11/2022, 11:05 AM

## 2022-08-11 NOTE — Progress Notes (Signed)
PROGRESS NOTE    Courtney Parks  AJG:[id number] DOB: [birth year] DOA: 08/09/2022 PCP: Merryl Hacker, No    Brief Narrative:  Courtney Parks is a 61 y.o. female with medical history significant of DM, HTN, HLD, and depression/anxiety presenting with n/v.  She was seen in the ER on 12/5 with urinary frequency and treated with Rocephin -> Keflex.  She returned on 12/6 with n/v; she was rehydrated and again discharged home with Keflex.   She continued to have n/v and returned to [facility] last night.  She reports having severe R hip pain which she likens to kidney stone pain and childbirth.  She also had n/v, last this AM.  She had not noticed SOB but was on O2 in the ER on 12/6.  She began feeling SOB last night.  +COVID but also found incidentially to have a ? Renal abscess.   She has a h/o R foot surgery in August and is fully non-weight bearing on that foot.   Assessment and Plan:  N/V, UTI- klebsiella -Patient with several days of n/v, diagnosed with UTI -N/V could be related to UTI and/or COVID (see below) -Zofran as needed -She did have mild hypotension in the ER and likely hypovolemia but has been bolused    Acute respiratory failure with hypoxia due to COVID-19 PNA -Patient with presenting with SOB, hypoxia to 88% on RA -She does not have a usual home O2 requirement and is currently requiring 2L [place] O2 -COVID POSITIVE -The patient has comorbidities which may increase the risk for ARDS/MODS including:  HTN, DM -CXR with multifocal opacities which may be c/w COVID  -Will not treat with broad-spectrum antibiotics if procalcitonin <0.5 -Encourage mobilization/ambulation as much as possible -Patient was seen wearing full PPE including: gown, gloves, N95, and eye protection; donning and doffing was in compliance with current standards.   Renal mass-- ? Suspected small renal abscess  -Incidental finding on imaging -She reports pain in her right hip but mass is on L -MRI  done -urology following- changed abx to cipro-- plan follow up imaging in 2-3 day with possible needs for drain by IR  DM -A1c in 03/2022 was 10.3 - poor control -May utilize continuous glucose monitoring -hold Trulicity -Continue glargine -Cover with moderate-scale SSI    R hip pain- ? Arthritis  -Nothing obvious on CT -Pain control     DVT prophylaxis: enoxaparin (LOVENOX) injection 40 mg Start: 08/09/22 1830    Code Status: Full Code Family Communication: on phone 12/9  Disposition Plan:  Level of care: Med-Surg Status is: Inpatient Remains inpatient appropriate because: needs re-imaging in 2-3 days    Consultants:  urology   Subjective: Up about to take a shower  Objective: Vitals:   08/10/22 0612 08/10/22 0855 08/10/22 2059 08/11/22 0548  BP: 131/64 139/67 137/60 (!) 140/68  Pulse: 77 80 77 82  Resp: '17 15 18 18  '$ Temp: 98.2 F (36.8 C) 98.6 F (37 C) 98.7 F (37.1 C) 98.9 F (37.2 C)  TempSrc:  Oral Oral Oral  SpO2: 100% 99% 97% 98%  Weight:      Height:        Intake/Output Summary (Last 24 hours) at 08/11/2022 1021 Last data filed at 08/10/2022 2200 Gross per 24 hour  Intake --  Output 400 ml  Net -400 ml   Filed Weights   08/09/22 1734  Weight: 81.2 kg    Examination:    General: Appearance:     Overweight female in no  acute distress     Lungs:     respirations unlabored  Heart:    Normal heart rate. Normal rhythm. No murmurs, rubs, or gallops.   MS:   All extremities are intact.   Neurologic:   Awake, alert, oriented x 3. No apparent focal neurological           defect.          Data Reviewed: I have personally reviewed following labs and imaging studies  CBC: Recent Labs  Lab 08/06/22 0749 08/07/22 0412 08/09/22 0506 08/09/22 0513 08/10/22 0423 08/11/22 0342  WBC 15.5* 16.6* 7.5  --  6.7 9.5  NEUTROABS 13.1*  --  6.9  --  5.5 8.4*  HGB 14.7 13.8 12.2 12.6 11.1* 11.0*  HCT 44.4 42.2 37.1 37.0 34.4* 33.0*  MCV 83.9  85.1 85.5  --  85.4 83.1  PLT 189 158 95*  --  90* 98*   Basic Metabolic Panel: Recent Labs  Lab 08/06/22 0749 08/07/22 0412 08/09/22 0506 08/09/22 0513 08/10/22 0423 08/11/22 0342  NA 135 134* 133* 133* 135 134*  K 4.4 3.6 3.6 3.5 3.9 4.2  CL 97* 100 97*  --  100 100  CO2 25 19* 20*  --  20* 22  GLUCOSE 198* 267* 242*  --  270* 299*  BUN '13 16 19  '$ --  16 21  CREATININE 0.51 0.60 0.58  --  0.57 0.50  CALCIUM 10.0 9.0 9.0  --  9.6 9.2   GFR: Estimated Creatinine Clearance: 80.9 mL/min (by C-G formula based on SCr of 0.5 mg/dL). Liver Function Tests: Recent Labs  Lab 08/07/22 0412 08/10/22 0423 08/11/22 0342  AST 31 23 14*  ALT '29 30 22  '$ ALKPHOS 61 110 82  BILITOT 1.5* 1.0 0.8  PROT 6.5 6.8 5.8*  ALBUMIN 3.5 3.0* 2.4*   Recent Labs  Lab 08/07/22 0412  LIPASE 27   No results for input(s): "AMMONIA" in the last 168 hours. Coagulation Profile: No results for input(s): "INR", "PROTIME" in the last 168 hours. Cardiac Enzymes: No results for input(s): "CKTOTAL", "CKMB", "CKMBINDEX", "TROPONINI" in the last 168 hours. BNP (last 3 results) No results for input(s): "PROBNP" in the last 8760 hours. HbA1C: No results for input(s): "HGBA1C" in the last 72 hours. CBG: Recent Labs  Lab 08/10/22 0856 08/10/22 1246 08/10/22 1636 08/10/22 2050 08/11/22 0853  GLUCAP 274* 301* 268* 273* 326*   Lipid Profile: No results for input(s): "CHOL", "HDL", "LDLCALC", "TRIG", "CHOLHDL", "LDLDIRECT" in the last 72 hours. Thyroid Function Tests: No results for input(s): "TSH", "T4TOTAL", "FREET4", "T3FREE", "THYROIDAB" in the last 72 hours. Anemia Panel: Recent Labs    08/09/22 1751  FERRITIN 327*   Sepsis Labs: Recent Labs  Lab 08/07/22 0535 08/09/22 0506 08/09/22 1751  PROCALCITON  --   --  8.54  LATICACIDVEN 1.8 1.6  --     Recent Results (from the past 240 hour(s))  Urine Culture     Status: Abnormal   Collection Time: 08/06/22  6:55 AM   Specimen: Urine, Clean  Catch  Result Value Ref Range Status   Specimen Description   Final    URINE, CLEAN CATCH Performed at [facility], [address] [place], [place] [address]    Special Requests   Final    NONE Performed at [facility], [address], [place], [place] [address]    Culture >=100,000 COLONIES/mL KLEBSIELLA PNEUMONIAE (A)  Final   Report Status 08/08/2022 FINAL  Final  Organism ID, Bacteria KLEBSIELLA PNEUMONIAE (A)  Final      Susceptibility   Klebsiella pneumoniae - MIC*    AMPICILLIN >=32 RESISTANT Resistant     CEFAZOLIN <=4 SENSITIVE Sensitive     CEFEPIME <=0.12 SENSITIVE Sensitive     CEFTRIAXONE <=0.25 SENSITIVE Sensitive     CIPROFLOXACIN <=0.25 SENSITIVE Sensitive     GENTAMICIN <=1 SENSITIVE Sensitive     IMIPENEM <=0.25 SENSITIVE Sensitive     NITROFURANTOIN 64 INTERMEDIATE Intermediate     TRIMETH/SULFA <=20 SENSITIVE Sensitive     AMPICILLIN/SULBACTAM 16 INTERMEDIATE Intermediate     PIP/TAZO <=4 SENSITIVE Sensitive     * >=100,000 COLONIES/mL KLEBSIELLA PNEUMONIAE  Blood culture (routine x 2)     Status: None (Preliminary result)   Collection Time: 08/07/22  5:35 AM   Specimen: BLOOD  Result Value Ref Range Status   Specimen Description   Final    BLOOD RIGHT ANTECUBITAL Performed at [facility], [address] [place], [place] [address]    Special Requests   Final    BOTTLES DRAWN AEROBIC AND ANAEROBIC Blood Culture results may not be optimal due to an inadequate volume of blood received in culture bottles Performed at [facility], [address], [place], [place] [address]    Culture   Final    NO GROWTH 3 DAYS Performed at [facility], [address] [place], [place] [address]    Report Status PENDING  Incomplete  Blood culture (routine x 2)     Status: None (Preliminary result)   Collection Time: 08/07/22  6:32 AM   Specimen: BLOOD  Result Value Ref Range  Status   Specimen Description   Final    BLOOD Performed at [facility], [address], [place], [place] [address]    Special Requests   Final    NONE Performed at [facility], [address], [place], [place] [address]    Culture   Final    NO GROWTH 3 DAYS Performed at [facility], [place] [address] [place], [place] [address]    Report Status PENDING  Incomplete  Blood culture (routine x 2)     Status: None (Preliminary result)   Collection Time: 08/09/22  5:06 AM   Specimen: BLOOD  Result Value Ref Range Status   Specimen Description   Final    BLOOD RIGHT ANTECUBITAL Performed at [facility], [address], [place], [place] [address]    Special Requests   Final    BOTTLES DRAWN AEROBIC AND ANAEROBIC Blood Culture adequate volume Performed at [facility], [address], [place], [place] [address]    Culture   Final    NO GROWTH < 24 HOURS Performed at [facility], [place] [address], [place], [place] [address]    Report Status PENDING  Incomplete  Resp Panel by RT-PCR (Flu A&B, Covid) Anterior Nasal Swab     Status: Abnormal   Collection Time: 08/09/22  5:48 AM   Specimen: Anterior Nasal Swab  Result Value Ref Range Status   SARS Coronavirus 2 by RT PCR POSITIVE (A) NEGATIVE Final    Comment: (NOTE) SARS-CoV-2 target nucleic acids are DETECTED.  The SARS-CoV-2 RNA is generally detectable in upper respiratory specimens during the acute phase of infection. Positive results are indicative of the presence of the identified virus, but do not rule out bacterial infection or co-infection with other pathogens not detected by the test. Clinical  correlation with patient history and other diagnostic information is necessary to determine patient infection status. The expected result is Negative.  Fact Sheet for Patients: EntrepreneurPulse.com.au  Fact Sheet for  Healthcare Providers: IncredibleEmployment.be  This test is not yet approved or cleared by the [place] FDA and  has been authorized for detection and/or diagnosis of SARS-CoV-2 by FDA under an Emergency Use Authorization (EUA).  This EUA will remain in effect (meaning this test can be used) for the duration of  the COVID-19 declaration under Section 564(b)(1) of the A ct, 21 U.S.C. section 360bbb-3(b)(1), unless the authorization is terminated or revoked sooner.     Influenza A by PCR NEGATIVE NEGATIVE Final   Influenza B by PCR NEGATIVE NEGATIVE Final    Comment: (NOTE) The Xpert Xpress SARS-CoV-2/FLU/RSV plus assay is intended as an aid in the diagnosis of influenza from Nasopharyngeal swab specimens and should not be used as a sole basis for treatment. Nasal washings and aspirates are unacceptable for Xpert Xpress SARS-CoV-2/FLU/RSV testing.  Fact Sheet for Patients: EntrepreneurPulse.com.au  Fact Sheet for Healthcare Providers: IncredibleEmployment.be  This test is not yet approved or cleared by the [place] FDA and has been authorized for detection and/or diagnosis of SARS-CoV-2 by FDA under an Emergency Use Authorization (EUA). This EUA will remain in effect (meaning this test can be used) for the duration of the COVID-19 declaration under Section 564(b)(1) of the Act, 21 U.S.C. section 360bbb-3(b)(1), unless the authorization is terminated or revoked.  Performed at [facility], [address], [place], [place] [address]   Blood culture (routine x 2)     Status: None (Preliminary result)   Collection Time: 08/09/22  6:01 AM   Specimen: BLOOD  Result Value Ref Range Status   Specimen Description   Final    BLOOD RIGHT HAND Performed at [facility], [address] [place], [place] [address]    Special Requests   Final    BOTTLES DRAWN AEROBIC AND ANAEROBIC  Blood Culture adequate volume Performed at [facility], [address] [place], [place] [address]    Culture   Final    NO GROWTH < 24 HOURS Performed at [facility], [place] [address] [place], [place] [address]    Report Status PENDING  Incomplete         Radiology Studies: DG HIP UNILAT WITH PELVIS 2-3 VIEWS RIGHT  Result Date: 08/10/2022 CLINICAL DATA:  Pain EXAM: DG HIP (WITH OR WITHOUT PELVIS) 3V RIGHT COMPARISON:  None Available. FINDINGS: There is no evidence of hip fracture or dislocation. 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 There are lumbosacral degenerative changes. Pelvic ring is intact. No osteolytic or osteoblastic changes identified. IMPRESSION: Degenerative changes.  No acute osseous abnormalities identified. Electronically Signed   By: Sammie Bench M.D.   On: 08/10/2022 09:39   MR ABDOMEN W WO CONTRAST  Result Date: 08/10/2022 CLINICAL DATA:  Left renal lesion in a patient with history of UTI. EXAM: MRI ABDOMEN WITHOUT AND WITH CONTRAST TECHNIQUE: Multiplanar multisequence MR imaging of the abdomen was performed both before and after the administration of intravenous contrast. CONTRAST:  7.55m GADAVIST GADOBUTROL 1 MMOL/ML IV SOLN COMPARISON:  CT stone study 08/09/2022 FINDINGS: Lower chest: Unremarkable. Hepatobiliary: Scattered tiny T2 hyperintense nonenhancing foci in the liver parenchyma are compatible with cysts. No followup imaging is recommended. Gallbladder surgically absent. No intrahepatic or extrahepatic biliary dilation. Pancreas: No focal mass lesion. No dilatation of the main duct.  No intraparenchymal cyst. No peripancreatic edema. Spleen:  No splenomegaly. No focal mass lesion. Adrenals/Urinary Tract: No adrenal nodule or mass. Tiny cortical cyst noted right kidney. No followup imaging is recommended. 1.7 x 2.2 x 1.8 cm lesion in the posterior interpolar left kidney shows heterogeneous signal intensity  on T2 imaging. Signal intensity on precontrast T1 imaging is nearly isointense to background kidney with some peripheral T1 shortening. Imaging after IV contrast administration shows peripheral rim enhancement. The lesion demonstrates restricted diffusion and on postcontrast imaging is relatively well-defined. The lesion measured 3.2 x 2.9 cm on CT scan of 08/09/2022 and although subtle, was present on a CT scan of 08/06/2022 when it measured 1.5 x 1.5 cm. This lesion was visible on a CT scan from 03/29/2022 when it measured 1.4 x 1.2 cm. Other additional small cortical cysts are noted in the left kidney. There is no hydronephrosis. Stomach/Bowel: Stomach is unremarkable. No gastric wall thickening. No evidence of outlet obstruction. Duodenum is normally positioned as is the ligament of Treitz. No small bowel or colonic dilatation within the visualized abdomen. Vascular/Lymphatic: No abdominal aortic aneurysm. No abdominal lymphadenopathy Other:  No intraperitoneal free fluid. Musculoskeletal: No focal suspicious marrow enhancement within the visualized bony anatomy. IMPRESSION: 1. 1.7 x 2.2 x 1.8 cm lesion in the posterior interpolar left kidney shows heterogeneous signal intensity on T2 imaging with peripheral rim enhancement and restricted diffusion. This lesion has been present since at least 03/29/2022 with interval increase in size between 08/06/2022 and 08/09/2022. Heterogeneous signal intensity noted on today's study with restricted diffusion. Given that this was present previously and has enlarged recently, superinfection of a previously existing cyst would be a consideration. Interval hemorrhage into a previous cyst would also be a consideration. Cystic neoplasm is considered unlikely based on prior imaging but is not entirely excluded. 2. Other small cortical cysts in both kidneys compatible with tiny simple cyst. No followup imaging is recommended. Electronically Signed   By: Misty Stanley M.D.   On:  08/10/2022 08:40   CT Renal Stone Study  Result Date: 08/09/2022 CLINICAL DATA:  Flank pain and hip pain EXAM: CT ABDOMEN AND PELVIS WITHOUT CONTRAST TECHNIQUE: Multidetector CT imaging of the abdomen and pelvis was performed following the standard protocol without IV contrast. RADIATION DOSE REDUCTION: This exam was performed according to the departmental dose-optimization program which includes automated exposure control, adjustment of the mA and/or kV according to patient size and/or use of iterative reconstruction technique. COMPARISON:  CT abdomen and pelvis dated December 5th 2023 FINDINGS: Lower chest: Trace pericardial and pleural effusions. Small hiatal hernia. No acute abnormality. Hepatobiliary: No focal liver abnormality is seen. Status post cholecystectomy. No biliary dilatation. Pancreas: Unremarkable. No pancreatic ductal dilatation or surrounding inflammatory changes. Spleen: Normal in size without focal abnormality. Adrenals/Urinary Tract: Bilateral adrenal glands are unremarkable. No hydronephrosis. Rim enhancing lesion of the lower pole of the left kidney measuring 3.1 x 2.9 cm on series 2, image 36. Not visible on prior exam due to lack of IV contrast. Bladder is unremarkable. Stomach/Bowel: Stomach is within normal limits. Appendix appears normal. No evidence of bowel wall thickening, distention, or inflammatory changes. Vascular/Lymphatic: Aortic atherosclerosis. No enlarged abdominal or pelvic lymph nodes. Reproductive: Uterus and bilateral adnexa are unremarkable. Other: No abdominal wall hernia or abnormality. No abdominopelvic ascites. Musculoskeletal: No acute or significant osseous findings. IMPRESSION: 1. Lesion of the lower pole of the left kidney measuring up to 3.1 cm with rim enhancement secondary to excreted contrast from prior PE  study. Favored to be renal abscess, although cystic renal mass is also a concern. Recommend contrast-enhanced abdominal MRI for better evaluation. 2.  Trace pericardial and pleural effusions. 3. Aortic Atherosclerosis (ICD10-I70.0). Electronically Signed   By: Yetta Glassman M.D.   On: 08/09/2022 15:41        Scheduled Meds:  vitamin C  500 mg Oral Daily   aspirin EC  81 mg Oral Daily   diclofenac Sodium  2 g Topical QID   docusate sodium  100 mg Oral BID   enoxaparin (LOVENOX) injection  40 mg Subcutaneous Q24H   insulin aspart  0-20 Units Subcutaneous TID WC   insulin aspart  0-5 Units Subcutaneous QHS   insulin glargine-yfgn  40 Units Subcutaneous Q1500   methylPREDNISolone (SOLU-MEDROL) injection  81.25 mg Intravenous Q24H   Followed by   Derrill Memo ON 08/12/2022] predniSONE  50 mg Oral Daily   nirmatrelvir/ritonavir EUA  3 tablet Oral BID   phenazopyridine  200 mg Oral TID   sodium chloride flush  3 mL Intravenous Q12H   zinc sulfate  220 mg Oral Daily   Continuous Infusions:  sodium chloride     ciprofloxacin 400 mg (08/10/22 2053)     LOS: 2 days    Time spent: 45 minutes spent on chart review, discussion with nursing staff, consultants, updating family and interview/physical exam; more than 50% of that time was spent in counseling and/or coordination of care.    Geradine Girt, DO [facility] Available via Epic secure chat 7am-7pm After these hours, please refer to coverage provider listed on [email or web address] 08/11/2022, 10:21 AM

## 2022-08-12 ENCOUNTER — Inpatient Hospital Stay (HOSPITAL_COMMUNITY): Payer: 59

## 2022-08-12 LAB — CBC WITH DIFFERENTIAL/PLATELET
Abs Immature Granulocytes: 0.21 10*3/uL — ABNORMAL HIGH (ref 0.00–0.07)
Basophils Absolute: 0.1 10*3/uL (ref 0.0–0.1)
Basophils Relative: 1 %
Eosinophils Absolute: 0.4 10*3/uL (ref 0.0–0.5)
Eosinophils Relative: 4 %
HCT: 36.2 % (ref 36.0–46.0)
Hemoglobin: 11.7 g/dL — ABNORMAL LOW (ref 12.0–15.0)
Immature Granulocytes: 2 %
Lymphocytes Relative: 9 %
Lymphs Abs: 1.1 10*3/uL (ref 0.7–4.0)
MCH: 27.5 pg (ref 26.0–34.0)
MCHC: 32.3 g/dL (ref 30.0–36.0)
MCV: 85.2 fL (ref 80.0–100.0)
Monocytes Absolute: 1.5 10*3/uL — ABNORMAL HIGH (ref 0.1–1.0)
Monocytes Relative: 12 %
Neutro Abs: 8.6 10*3/uL — ABNORMAL HIGH (ref 1.7–7.7)
Neutrophils Relative %: 72 %
Platelets: 111 10*3/uL — ABNORMAL LOW (ref 150–400)
RBC: 4.25 MIL/uL (ref 3.87–5.11)
RDW: 15.1 % (ref 11.5–15.5)
Smear Review: DECREASED
WBC: 11.8 10*3/uL — ABNORMAL HIGH (ref 4.0–10.5)
nRBC: 0 % (ref 0.0–0.2)

## 2022-08-12 LAB — COMPREHENSIVE METABOLIC PANEL
ALT: 21 U/L (ref 0–44)
AST: 14 U/L — ABNORMAL LOW (ref 15–41)
Albumin: 2.6 g/dL — ABNORMAL LOW (ref 3.5–5.0)
Alkaline Phosphatase: 75 U/L (ref 38–126)
Anion gap: 10 (ref 5–15)
BUN: 16 mg/dL (ref 8–23)
CO2: 25 mmol/L (ref 22–32)
Calcium: 9.1 mg/dL (ref 8.9–10.3)
Chloride: 100 mmol/L (ref 98–111)
Creatinine, Ser: 0.45 mg/dL (ref 0.44–1.00)
GFR, Estimated: >60 mL/min (ref >60)
Glucose, Bld: 306 mg/dL — ABNORMAL HIGH (ref 70–99)
Potassium: 3.8 mmol/L (ref 3.5–5.1)
Sodium: 135 mmol/L (ref 135–145)
Total Bilirubin: 0.9 mg/dL (ref 0.3–1.2)
Total Protein: 5.9 g/dL — ABNORMAL LOW (ref 6.5–8.1)

## 2022-08-12 LAB — CULTURE, BLOOD (ROUTINE X 2)
Culture: NO GROWTH
Culture: NO GROWTH

## 2022-08-12 LAB — GLUCOSE, CAPILLARY
Glucose-Capillary: 400 mg/dL — ABNORMAL HIGH (ref 70–99)
Glucose-Capillary: 283 mg/dL — ABNORMAL HIGH (ref 70–99)
Glucose-Capillary: 291 mg/dL — ABNORMAL HIGH (ref 70–99)

## 2022-08-12 MED ORDER — METRONIDAZOLE 500 MG PO TABS
500.0000 mg | ORAL_TABLET | Freq: Two times a day (BID) | ORAL | Status: DC
  Administered 2022-08-12 – 2022-08-15 (×6): 500 mg via ORAL
  Filled 2022-08-12 (×6): qty 1

## 2022-08-12 MED ORDER — SODIUM CHLORIDE 0.9 % IV SOLN
2.0000 g | INTRAVENOUS | Status: DC
  Administered 2022-08-12 – 2022-08-13 (×2): 2 g via INTRAVENOUS
  Filled 2022-08-12 (×2): qty 20

## 2022-08-12 MED ORDER — IOHEXOL 350 MG/ML SOLN
75.0000 mL | Freq: Once | INTRAVENOUS | Status: AC | PRN
  Administered 2022-08-12: 75 mL via INTRAVENOUS

## 2022-08-12 NOTE — Progress Notes (Signed)
Consultation Progress Note   Patient: Courtney Parks DOB: 24-May-1961 DOA: 08/09/2022 DOS: the patient was seen and examined on 08/12/2022 Primary service: Jester Klingberg, Manfred Shirts, MD  Brief hospital course: Courtney Parks is a 61 y.o. female with medical history significant of DM, HTN, HLD, and depression/anxiety presenting with n/v.  She was seen in the ER on 12/5 with urinary frequency and treated with Rocephin -> Keflex.  She returned on 12/6 with n/v; she was rehydrated and again discharged home with Keflex.   She continued to have n/v and returned to [facility] last night.  She reports having severe R hip pain which she likens to kidney stone pain and childbirth.  She also had n/v, last this AM.  She had not noticed SOB but was on O2 in the ER on 12/6.  She began feeling SOB last night.  +COVID but also found incidentially to have a ? Renal abscess.   She has a h/o R foot surgery in August and is fully non-weight bearing on that foot.  Assessment and Plan: N/V, UTI- klebsiella -Patient with several days of n/v, diagnosed with UTI -N/V could be related to UTI and/or COVID (see below) -Zofran as needed -She did have mild hypotension in the ER and likely hypovolemia but has been bolused    Acute respiratory failure with hypoxia due to COVID-19 PNA -Patient with presenting with SOB, hypoxia to 88% on RA -She does not have a usual home O2 requirement and is currently requiring 2L [place] O2 -COVID POSITIVE -The patient has comorbidities which may increase the risk for ARDS/MODS including:  HTN, DM -CXR with multifocal opacities which may be c/w COVID  -Will not treat with broad-spectrum antibiotics if procalcitonin <0.5 -Encourage mobilization/ambulation as much as possible -Patient was seen wearing full PPE including: gown, gloves, N95, and eye protection; donning and doffing was in compliance with current standards.   Renal mass-- ? Suspected small renal abscess   -Incidental finding on imaging -She reports pain in her right hip but mass is on L -MRI done -urology following- changed abx to cipro-- plan follow up imaging in 2-3 day with possible needs for drain by IR   DM -A1c in 03/2022 was 10.3 - poor control -May utilize continuous glucose monitoring -hold Trulicity -Continue glargine -Cover with moderate-scale SSI     R hip pain- ? Arthritis  -Nothing obvious on CT -Pain control             [facility] will continue to follow the patient.  Subjective: No complaints this morning, feels so much better compared to time of admit.  Physical Exam: Vitals:   08/10/22 0855 08/10/22 2059 08/11/22 0548 08/12/22 0600  BP: 139/67 137/60 (!) 140/68 (!) 164/90  Pulse: 80 77 82 72  Resp: '15 18 18 17  '$ Temp: 98.6 F (37 C) 98.7 F (37.1 C) 98.9 F (37.2 C) 98 F (36.7 C)  TempSrc: Oral Oral Oral Oral  SpO2: 99% 97% 98% 98%  Weight:      Height:       General: Appearance:     Overweight female in no acute distress       Lungs:     respirations unlabored  Heart:    Normal heart rate. Normal rhythm. No murmurs, rubs, or gallops.   MS:   All extremities are intact.   Neurologic:   Awake, alert, oriented x 3. No apparent focal neurological           defect.  Data Reviewed:  There are no new results to review at this time.  Family Communication:   Time spent: 15 minutes.  Author: Cristela Felt, MD 08/12/2022 10:30 AM  For on call review [email or web address].

## 2022-08-12 NOTE — Progress Notes (Signed)
PT Cancellation Note  Patient Details Name: Courtney Parks MRN: [id number] DOB: [birth year]   Cancelled Treatment:    Reason Eval/Treat Not Completed: Fatigue/lethargy limiting ability to participate; reports busy morning showered, made her bed and went down for CT scan with contrast and had to have Benadryl.  Also issues with blood sugar.  Prefers to rest at this time.  Will attempt another day.   Reginia Naas 08/12/2022, 3:01 PM Magda Kiel, PT Acute Rehabilitation Services Office:[phone] 08/12/2022

## 2022-08-12 NOTE — Progress Notes (Signed)
Patient ID: Courtney Parks, female   DOB: 19-Oct-1960, 61 y.o.   MRN: [id number]    Subjective: Courtney Parks has had a bit of a rough day overall.  Blood glucose has been difficult to control.  Denies worsening left flank pain.  Objective: Vital signs in last 24 hours: Temp:  [98 F (36.7 C)] 98 F (36.7 C) (12/11 0600) Pulse Rate:  [72] 72 (12/11 0600) Resp:  [17] 17 (12/11 0600) BP: (164)/(90) 164/90 (12/11 0600) SpO2:  [98 %] 98 % (12/11 0600)  Intake/Output from previous day: 12/10 0701 - 12/11 0700 In: 700 [P.O.:700] Out: 900 [Urine:900] Intake/Output this shift: No intake/output data recorded.  Physical Exam:  General: Alert and oriented Abd: Stable mild left CVAT  Lab Results: Recent Labs    08/10/22 0423 08/11/22 0342 08/12/22 0434  HGB 11.1* 11.0* 11.7*  HCT 34.4* 33.0* 36.2      Latest Ref Rng & Units 08/12/2022    4:34 AM 08/11/2022    3:42 AM 08/10/2022    4:23 AM  CBC  WBC 4.0 - 10.5 K/uL 11.8  9.5  6.7   Hemoglobin 12.0 - 15.0 g/dL 11.7  11.0  11.1   Hematocrit 36.0 - 46.0 % 36.2  33.0  34.4   Platelets 150 - 400 K/uL 111  98  90      BMET Recent Labs    08/11/22 0342 08/12/22 0434  NA 134* 135  K 4.2 3.8  CL 100 100  CO2 22 25  GLUCOSE 299* 306*  BUN 21 16  CREATININE 0.50 0.45  CALCIUM 9.2 9.1     Studies/Results: CT ABDOMEN W WO CONTRAST  Result Date: 08/12/2022 CLINICAL DATA:  Follow-up LEFT renal mass, indeterminate renal mass in a 60 year old female. EXAM: CT ABDOMEN WITHOUT AND WITH CONTRAST TECHNIQUE: Multidetector CT imaging of the abdomen was performed following the standard protocol before and following the bolus administration of intravenous contrast. RADIATION DOSE REDUCTION: This exam was performed according to the departmental dose-optimization program which includes automated exposure control, adjustment of the mA and/or kV according to patient size and/or use of iterative reconstruction technique. CONTRAST:  5m  OMNIPAQUE IOHEXOL 350 MG/ML SOLN COMPARISON:  August 09, 2022 FINDINGS: Lower chest: Incidental imaging of the lung bases with small effusions and basilar atelectasis. Slightly increased since previous imaging. Hepatobiliary: Lobular hepatic contours with fissural widening of hepatic fissures. No focal, suspicious hepatic lesion. Portal vein is patent. Post cholecystectomy without signs of biliary duct dilation. Pancreas: Mild atrophy of the pancreas without ductal dilation, inflammation or visible lesion. Spleen: Normal. Adrenals/Urinary Tract: Adrenal glands are normal. Low-attenuation lesion in the posterior lower-interpolar LEFT kidney measuring 2.0 x 1.8 cm previously 2.2 x 1.8 cm as measured on the MRI evaluation. On noncontrast imaging the area appears little changed compared to imaging from July 10, 2021. Some hypoattenuation surrounding the lesion also little changed compared to previous imaging. Small tract extending towards the capsule is similar without signs of perinephric fluid collection. Scattered subcentimeter hypodensities in the bilateral kidneys compatible with Bosniak category 2 lesions are similarly unchanged. Mild peripelvic stranding on the LEFT. Renal veins are patent. Stomach/Bowel: No acute gastrointestinal findings. Pelvic bowel loops are not imaged. Vascular/Lymphatic: Calcified and noncalcified atheromatous plaque of the abdominal aorta. There is no gastrohepatic or hepatoduodenal ligament lymphadenopathy. No retroperitoneal or mesenteric lymphadenopathy. Other: No ascites Musculoskeletal: No acute or significant osseous findings. IMPRESSION: 1. Findings that are most suspicious for a superinfected hemorrhagic cyst of the lower pole the LEFT  kidney. Renal neoplasm is felt less likely but would suggest follow-up in 6-8 weeks to ensure resolution. 2. Mild peripelvic stranding on the LEFT. Likely related underlying urinary tract infection. 3. Lobular hepatic contours with fissural  widening of hepatic fissures. Correlate with any clinical or laboratory evidence of liver disease. 4. Post cholecystectomy without signs of biliary duct dilation. 5. Small bilateral pleural effusions and basilar atelectasis. Slightly increased since previous imaging. 6. Aortic atherosclerosis. Aortic Atherosclerosis (ICD10-I70.0). Electronically Signed   By: Zetta Bills M.D.   On: 08/12/2022 08:44    Assessment/Plan: 1) Left renal lesion/UTI: CT done today suggests that this is most likely an infected left renal cyst.  She is clinically stable on ciprofloxacin for Klebsiella UTI although WBC is slightly higher and with some poor glucose control.  Will tentatively make her NPO after MN and if WBC continues to trend upward, will discuss aspiration of left renal cystic lesion tomorrow.     LOS: 3 days   Dutch Gray 08/12/2022, 4:24 PM

## 2022-08-12 NOTE — Inpatient Diabetes Management (Signed)
Inpatient Diabetes Program Recommendations  AACE/ADA: New Consensus Statement on Inpatient Glycemic Control   Target Ranges:  Prepandial:   less than 140 mg/dL      Peak postprandial:   less than 180 mg/dL (1-2 hours)      Critically ill patients:  140 - 180 mg/dL    Latest Reference Range & Units 08/11/22 08:53 08/11/22 12:20 08/11/22 17:12 08/11/22 21:16  Glucose-Capillary 70 - 99 mg/dL 326 (H) 307 (H) 230 (H) 274 (H)   Review of Glycemic Control  Diabetes history: DM2 Outpatient Diabetes medications: Toujeo 64 units QPM, Trulicity 3 mg Qweek Current orders for Inpatient glycemic control: Novolog 0-20 units TID with meals, Novolog 0-5 units QHS, Semglee 40 units daily; Prednisone 50 mg daily  Inpatient Diabetes Program Recommendations:    Insulin: If steroids are continued, please consider increasing Semglee to 50 units daily and ordering Novolog 4 units TID with meals for meal coverage if patient eats at least 50% of meals.  Thanks, Barnie Alderman, RN, MSN, [place] [facility] Coordinator Inpatient Diabetes Program [phone] (Team Pager from 8am to [place])

## 2022-08-13 DIAGNOSIS — N3000 Acute cystitis without hematuria: Secondary | ICD-10-CM

## 2022-08-13 LAB — GLUCOSE, CAPILLARY
Glucose-Capillary: 171 mg/dL — ABNORMAL HIGH (ref 70–99)
Glucose-Capillary: 148 mg/dL — ABNORMAL HIGH (ref 70–99)
Glucose-Capillary: 161 mg/dL — ABNORMAL HIGH (ref 70–99)
Glucose-Capillary: 110 mg/dL — ABNORMAL HIGH (ref 70–99)
Glucose-Capillary: 267 mg/dL — ABNORMAL HIGH (ref 70–99)

## 2022-08-13 LAB — PATHOLOGIST SMEAR REVIEW

## 2022-08-13 LAB — COMPREHENSIVE METABOLIC PANEL
ALT: 17 U/L (ref 0–44)
AST: 13 U/L — ABNORMAL LOW (ref 15–41)
Albumin: 2.7 g/dL — ABNORMAL LOW (ref 3.5–5.0)
Alkaline Phosphatase: 74 U/L (ref 38–126)
Anion gap: 9 (ref 5–15)
BUN: 13 mg/dL (ref 8–23)
CO2: 27 mmol/L (ref 22–32)
Calcium: 9.2 mg/dL (ref 8.9–10.3)
Chloride: 101 mmol/L (ref 98–111)
Creatinine, Ser: 0.41 mg/dL — ABNORMAL LOW (ref 0.44–1.00)
GFR, Estimated: >60 mL/min (ref >60)
Glucose, Bld: 177 mg/dL — ABNORMAL HIGH (ref 70–99)
Potassium: 3.7 mmol/L (ref 3.5–5.1)
Sodium: 137 mmol/L (ref 135–145)
Total Bilirubin: 0.5 mg/dL (ref 0.3–1.2)
Total Protein: 6.1 g/dL — ABNORMAL LOW (ref 6.5–8.1)

## 2022-08-13 LAB — CBC WITH DIFFERENTIAL/PLATELET
Abs Immature Granulocytes: 0.66 10*3/uL — ABNORMAL HIGH (ref 0.00–0.07)
Basophils Absolute: 0.1 10*3/uL (ref 0.0–0.1)
Basophils Relative: 0 %
Eosinophils Absolute: 0 10*3/uL (ref 0.0–0.5)
Eosinophils Relative: 0 %
HCT: 37.2 % (ref 36.0–46.0)
Hemoglobin: 12.8 g/dL (ref 12.0–15.0)
Immature Granulocytes: 5 %
Lymphocytes Relative: 15 %
Lymphs Abs: 2 10*3/uL (ref 0.7–4.0)
MCH: 28.4 pg (ref 26.0–34.0)
MCHC: 34.4 g/dL (ref 30.0–36.0)
MCV: 82.7 fL (ref 80.0–100.0)
Monocytes Absolute: 0.9 10*3/uL (ref 0.1–1.0)
Monocytes Relative: 7 %
Neutro Abs: 9.5 10*3/uL — ABNORMAL HIGH (ref 1.7–7.7)
Neutrophils Relative %: 73 %
Platelets: 141 10*3/uL — ABNORMAL LOW (ref 150–400)
RBC: 4.5 MIL/uL (ref 3.87–5.11)
RDW: 14.6 % (ref 11.5–15.5)
WBC: 13.1 10*3/uL — ABNORMAL HIGH (ref 4.0–10.5)
nRBC: 0 % (ref 0.0–0.2)

## 2022-08-13 MED ORDER — INSULIN ASPART 100 UNIT/ML IJ SOLN
4.0000 [IU] | Freq: Three times a day (TID) | INTRAMUSCULAR | Status: DC
  Administered 2022-08-13 – 2022-08-15 (×4): 4 [IU] via SUBCUTANEOUS

## 2022-08-13 NOTE — Consult Note (Signed)
Chief Complaint: Patient was seen in consultation today for  Chief Complaint  Patient presents with   Hip Pain    Referring Physician(s): Dr. Loleta Books   Supervising Physician: Daryll Brod  Patient Status: [facility] - In-pt  History of Present Illness: Courtney Parks is a 61 y.o. female with a  medical history significant for DM, HTN, anxiety/depression who was seen recently (12/5) in the ED for urinary frequency and was treated with antibiotics. She returned to the ED 12/6 with nausea/vomiting and was given IV fluids and discharged home on oral antibiotics. She presented again to the [facility] ED 08/09/22 with complaints of nausea, vomiting, right hip pain and shortness of breath. She was found to be COVID+ and a left renal abscess/cyst was also identified.   CT Abdomen with and without contrast 08/11/22 IMPRESSION: 1. Findings that are most suspicious for a superinfected hemorrhagic cyst of the lower pole the LEFT kidney. Renal neoplasm is felt less likely but would suggest follow-up in 6-8 weeks to ensure resolution. 2. Mild peripelvic stranding on the LEFT. Likely related underlying urinary tract infection. 3. Lobular hepatic contours with fissural widening of hepatic fissures. Correlate with any clinical or laboratory evidence of liver disease. 4. Post cholecystectomy without signs of biliary duct dilation. 5. Small bilateral pleural effusions and basilar atelectasis. Slightly increased since previous imaging. 6. Aortic atherosclerosis.  Interventional Radiology has been asked to evaluate this patient for an image-guided left renal cyst aspiration. Imaging reviewed and procedure approved by Dr. Annamaria Boots.   Past Medical History:  Diagnosis Date   Anxiety    Depression    Diabetes mellitus, type II ([place])    Hyperlipidemia    Hypertension    Kidney stones     Past Surgical History:  Procedure Laterality Date   CHOLECYSTECTOMY  02/28/2012   Procedure: LAPAROSCOPIC  CHOLECYSTECTOMY WITH INTRAOPERATIVE CHOLANGIOGRAM;  Surgeon: Imogene Burn. Georgette Dover, MD;  Location: [facility];  Service: General;  Laterality: N/A;   I & D EXTREMITY Right 04/03/2022   Procedure: IRRIGATION AND DEBRIDEMENT RIGHT FOOT WITH WOUND VAC;  Surgeon: Felipa Furnace, DPM;  Location: [facility];  Service: Podiatry;  Laterality: Right;   IRRIGATION AND DEBRIDEMENT FOOT Right 03/30/2022   Procedure: IRRIGATION AND DEBRIDEMENT FOOT;  Surgeon: [name], DPM;  Location: [facility];  Service: Podiatry;  Laterality: Right;   ULNAR NERVE REPAIR     URETERAL STENT PLACEMENT      Allergies: Codeine, Demerol, Other, and Shellfish allergy  Medications: Prior to Admission medications   Medication Sig Start Date End Date Taking? Authorizing Provider  acetaminophen (TYLENOL) 500 MG tablet Take 500 mg by mouth every 6 (six) hours as needed for moderate pain.   Yes [provider]  aspirin EC 81 MG tablet Take 81 mg by mouth daily. Swallow whole.   Yes [provider]  cephALEXin (KEFLEX) 500 MG capsule Take 1 capsule (500 mg total) by mouth 4 (four) times daily. 08/06/22  Yes Fredia Sorrow, MD  ibuprofen (ADVIL) 200 MG tablet Take 200-800 mg by mouth every 6 (six) hours as needed for moderate pain.   Yes [provider]  insulin glargine, 1 Unit Dial, (TOUJEO SOLOSTAR) 300 UNIT/ML Solostar Pen Inject 42 Units into the skin daily in the afternoon. Patient taking differently: Inject 64 Units into the skin daily in the afternoon. 04/05/22  Yes Amin, Jeanella Flattery, MD  Magnesium 100 MG CAPS Take 100 mg by mouth daily.   Yes [provider]  ondansetron (  ZOFRAN-ODT) 4 MG disintegrating tablet Take 1 tablet (4 mg total) by mouth every 8 (eight) hours as needed for nausea or vomiting. 08/07/22  Yes Horton, Barbette Hair, MD  phenazopyridine (PYRIDIUM) 200 MG tablet Take 1 tablet (200 mg total) by mouth 3 (three) times daily. 08/06/22  Yes Fredia Sorrow, MD  Probiotic Product (PROBIOTIC  BLEND PO) Take 1 tablet by mouth daily.   Yes [provider]  TRULICITY 3 WU/1.3KG SOPN Inject 3 mg into the skin once a week. 07/03/22  Yes [provider]  Turmeric 500 MG CAPS Take 500 mg by mouth daily.   Yes [provider]  vitamin C (ASCORBIC ACID) 250 MG tablet Take 250 mg by mouth daily.   Yes [provider]  zinc gluconate 50 MG tablet Take 50 mg by mouth daily.   Yes [provider]  Continuous Blood Gluc Sensor (FREESTYLE LIBRE 2 SENSOR) MISC 1 each by Does not apply route PRO. 04/05/22   Amin, Jeanella Flattery, MD  Insulin Pen Needle (PEN NEEDLES 3/16") 31G X 5 MM MISC 100 each by Does not apply route 4 (four) times daily. 04/05/22   Damita Lack, MD     Family History  Problem Relation Age of Onset   Diabetes Mother    Hypertension Mother    Colon cancer Mother    Hypertension Father    ADD / ADHD Maternal Grandmother     Social History   Socioeconomic History   Marital status: Divorced    Spouse name: Not on file   Number of children: Not on file   Years of education: Not on file   Highest education level: Not on file  Occupational History   Not on file  Tobacco Use   Smoking status: Former    Years: 5.00    Types: Cigarettes    Quit date: 12/12/1983    Years since quitting: 38.6   Smokeless tobacco: Never  Vaping Use   Vaping Use: Never used  Substance and Sexual Activity   Alcohol use: No   Drug use: No   Sexual activity: Not on file  Other Topics Concern   Not on file  Social History Narrative   Not on file   Social Determinants of Health   Financial Resource Strain: Not on file  Food Insecurity: Not on file  Transportation Needs: Not on file  Physical Activity: Not on file  Stress: Not on file  Social Connections: Not on file    Review of Systems: A 12 point ROS discussed and pertinent positives are indicated in the HPI above.  All other systems are negative.  Review of Systems  Constitutional:   Negative for appetite change and fatigue.  Respiratory:  Positive for shortness of breath. Negative for cough.   Cardiovascular:  Negative for chest pain and leg swelling.  Gastrointestinal:  Negative for abdominal pain, diarrhea, nausea and vomiting.  Musculoskeletal:  Positive for back pain.  Neurological:  Negative for dizziness and headaches.    Vital Signs: BP (!) 168/86 (BP Location: Right Arm)   Pulse 70   Temp 98.6 F (37 C)   Resp 18   Ht '5\' 7"'$  (1.702 m)   Wt 179 lb (81.2 kg)   SpO2 96%   BMI 28.04 kg/m   Physical Exam Constitutional:      General: She is not in acute distress.    Appearance: She is not ill-appearing.  HENT:     Mouth/Throat:  Mouth: Mucous membranes are moist.     Pharynx: Oropharynx is clear.  Cardiovascular:     Rate and Rhythm: Normal rate and regular rhythm.  Pulmonary:     Effort: Pulmonary effort is normal.  Neurological:     Mental Status: She is alert and oriented to person, place, and time.  Psychiatric:        Mood and Affect: Mood normal.        Behavior: Behavior normal.     Imaging: CT ABDOMEN W WO CONTRAST  Result Date: 08/12/2022 CLINICAL DATA:  Follow-up LEFT renal mass, indeterminate renal mass in a 61 year old female. EXAM: CT ABDOMEN WITHOUT AND WITH CONTRAST TECHNIQUE: Multidetector CT imaging of the abdomen was performed following the standard protocol before and following the bolus administration of intravenous contrast. RADIATION DOSE REDUCTION: This exam was performed according to the departmental dose-optimization program which includes automated exposure control, adjustment of the mA and/or kV according to patient size and/or use of iterative reconstruction technique. CONTRAST:  65m OMNIPAQUE IOHEXOL 350 MG/ML SOLN COMPARISON:  August 09, 2022 FINDINGS: Lower chest: Incidental imaging of the lung bases with small effusions and basilar atelectasis. Slightly increased since previous imaging. Hepatobiliary: Lobular  hepatic contours with fissural widening of hepatic fissures. No focal, suspicious hepatic lesion. Portal vein is patent. Post cholecystectomy without signs of biliary duct dilation. Pancreas: Mild atrophy of the pancreas without ductal dilation, inflammation or visible lesion. Spleen: Normal. Adrenals/Urinary Tract: Adrenal glands are normal. Low-attenuation lesion in the posterior lower-interpolar LEFT kidney measuring 2.0 x 1.8 cm previously 2.2 x 1.8 cm as measured on the MRI evaluation. On noncontrast imaging the area appears little changed compared to imaging from July 10, 2021. Some hypoattenuation surrounding the lesion also little changed compared to previous imaging. Small tract extending towards the capsule is similar without signs of perinephric fluid collection. Scattered subcentimeter hypodensities in the bilateral kidneys compatible with Bosniak category 2 lesions are similarly unchanged. Mild peripelvic stranding on the LEFT. Renal veins are patent. Stomach/Bowel: No acute gastrointestinal findings. Pelvic bowel loops are not imaged. Vascular/Lymphatic: Calcified and noncalcified atheromatous plaque of the abdominal aorta. There is no gastrohepatic or hepatoduodenal ligament lymphadenopathy. No retroperitoneal or mesenteric lymphadenopathy. Other: No ascites Musculoskeletal: No acute or significant osseous findings. IMPRESSION: 1. Findings that are most suspicious for a superinfected hemorrhagic cyst of the lower pole the LEFT kidney. Renal neoplasm is felt less likely but would suggest follow-up in 6-8 weeks to ensure resolution. 2. Mild peripelvic stranding on the LEFT. Likely related underlying urinary tract infection. 3. Lobular hepatic contours with fissural widening of hepatic fissures. Correlate with any clinical or laboratory evidence of liver disease. 4. Post cholecystectomy without signs of biliary duct dilation. 5. Small bilateral pleural effusions and basilar atelectasis. Slightly  increased since previous imaging. 6. Aortic atherosclerosis. Aortic Atherosclerosis (ICD10-I70.0). Electronically Signed   By: GZetta BillsM.D.   On: 08/12/2022 08:44   DG HIP UNILAT WITH PELVIS 2-3 VIEWS RIGHT  Result Date: 08/10/2022 CLINICAL DATA:  Pain EXAM: DG HIP (WITH OR WITHOUT PELVIS) 3V RIGHT COMPARISON:  None Available. FINDINGS: There is no evidence of hip fracture or dislocation. 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 There are lumbosacral degenerative changes. Pelvic ring is intact. No osteolytic or osteoblastic changes identified. IMPRESSION: Degenerative changes.  No acute osseous abnormalities identified. Electronically Signed   By: JSammie BenchM.D.   On: 08/10/2022 09:39   MR ABDOMEN W WO CONTRAST  Result Date: 08/10/2022 CLINICAL DATA:  Left renal lesion in a patient with history of UTI. EXAM: MRI ABDOMEN WITHOUT AND WITH CONTRAST TECHNIQUE: Multiplanar multisequence MR imaging of the abdomen was performed both before and after the administration of intravenous contrast. CONTRAST:  7.67m GADAVIST GADOBUTROL 1 MMOL/ML IV SOLN COMPARISON:  CT stone study 08/09/2022 FINDINGS: Lower chest: Unremarkable. Hepatobiliary: Scattered tiny T2 hyperintense nonenhancing foci in the liver parenchyma are compatible with cysts. No followup imaging is recommended. Gallbladder surgically absent. No intrahepatic or extrahepatic biliary dilation. Pancreas: No focal mass lesion. No dilatation of the main duct. No intraparenchymal cyst. No peripancreatic edema. Spleen:  No splenomegaly. No focal mass lesion. Adrenals/Urinary Tract: No adrenal nodule or mass. Tiny cortical cyst noted right kidney. No followup imaging is recommended. 1.7 x 2.2 x 1.8 cm lesion in the posterior interpolar left kidney shows heterogeneous signal intensity on T2 imaging. Signal intensity on precontrast T1 imaging is nearly isointense to background kidney with some peripheral T1 shortening. Imaging  after IV contrast administration shows peripheral rim enhancement. The lesion demonstrates restricted diffusion and on postcontrast imaging is relatively well-defined. The lesion measured 3.2 x 2.9 cm on CT scan of 08/09/2022 and although subtle, was present on a CT scan of 08/06/2022 when it measured 1.5 x 1.5 cm. This lesion was visible on a CT scan from 03/29/2022 when it measured 1.4 x 1.2 cm. Other additional small cortical cysts are noted in the left kidney. There is no hydronephrosis. Stomach/Bowel: Stomach is unremarkable. No gastric wall thickening. No evidence of outlet obstruction. Duodenum is normally positioned as is the ligament of Treitz. No small bowel or colonic dilatation within the visualized abdomen. Vascular/Lymphatic: No abdominal aortic aneurysm. No abdominal lymphadenopathy Other:  No intraperitoneal free fluid. Musculoskeletal: No focal suspicious marrow enhancement within the visualized bony anatomy. IMPRESSION: 1. 1.7 x 2.2 x 1.8 cm lesion in the posterior interpolar left kidney shows heterogeneous signal intensity on T2 imaging with peripheral rim enhancement and restricted diffusion. This lesion has been present since at least 03/29/2022 with interval increase in size between 08/06/2022 and 08/09/2022. Heterogeneous signal intensity noted on today's study with restricted diffusion. Given that this was present previously and has enlarged recently, superinfection of a previously existing cyst would be a consideration. Interval hemorrhage into a previous cyst would also be a consideration. Cystic neoplasm is considered unlikely based on prior imaging but is not entirely excluded. 2. Other small cortical cysts in both kidneys compatible with tiny simple cyst. No followup imaging is recommended. Electronically Signed   By: EMisty StanleyM.D.   On: 08/10/2022 08:40   CT Renal Stone Study  Result Date: 08/09/2022 CLINICAL DATA:  Flank pain and hip pain EXAM: CT ABDOMEN AND PELVIS WITHOUT  CONTRAST TECHNIQUE: Multidetector CT imaging of the abdomen and pelvis was performed following the standard protocol without IV contrast. RADIATION DOSE REDUCTION: This exam was performed according to the departmental dose-optimization program which includes automated exposure control, adjustment of the mA and/or kV according to patient size and/or use of iterative reconstruction technique. COMPARISON:  CT abdomen and pelvis dated December 5th 2023 FINDINGS: Lower chest: Trace pericardial and pleural effusions. Small hiatal hernia. No acute abnormality. Hepatobiliary: No focal liver abnormality is seen. Status post cholecystectomy. No biliary dilatation. Pancreas: Unremarkable. No pancreatic ductal dilatation or surrounding inflammatory changes. Spleen: Normal in size without focal abnormality. Adrenals/Urinary Tract: Bilateral adrenal glands are unremarkable. No hydronephrosis. Rim enhancing lesion of the lower pole of the left kidney measuring 3.1 x 2.9 cm on series 2,  image 36. Not visible on prior exam due to lack of IV contrast. Bladder is unremarkable. Stomach/Bowel: Stomach is within normal limits. Appendix appears normal. No evidence of bowel wall thickening, distention, or inflammatory changes. Vascular/Lymphatic: Aortic atherosclerosis. No enlarged abdominal or pelvic lymph nodes. Reproductive: Uterus and bilateral adnexa are unremarkable. Other: No abdominal wall hernia or abnormality. No abdominopelvic ascites. Musculoskeletal: No acute or significant osseous findings. IMPRESSION: 1. Lesion of the lower pole of the left kidney measuring up to 3.1 cm with rim enhancement secondary to excreted contrast from prior PE study. Favored to be renal abscess, although cystic renal mass is also a concern. Recommend contrast-enhanced abdominal MRI for better evaluation. 2. Trace pericardial and pleural effusions. 3. Aortic Atherosclerosis (ICD10-I70.0). Electronically Signed   By: Yetta Glassman M.D.   On:  08/09/2022 15:41   CT Angio Chest PE W and/or Wo Contrast  Result Date: 08/09/2022 CLINICAL DATA:  Shortness of breath and hypoxia. EXAM: CT ANGIOGRAPHY CHEST WITH CONTRAST TECHNIQUE: Multidetector CT imaging of the chest was performed using the standard protocol during bolus administration of intravenous contrast. Multiplanar CT image reconstructions and MIPs were obtained to evaluate the vascular anatomy. RADIATION DOSE REDUCTION: This exam was performed according to the departmental dose-optimization program which includes automated exposure control, adjustment of the mA and/or kV according to patient size and/or use of iterative reconstruction technique. CONTRAST:  167m OMNIPAQUE IOHEXOL 350 MG/ML SOLN COMPARISON:  09/14/2003 chest CT. FINDINGS: Cardiovascular: Cardiomegaly without substantial pericardial effusion. Coronary artery calcification is evident. Mild atherosclerotic calcification is noted in the wall of the thoracic aorta. There is no filling defect within the opacified pulmonary arteries to suggest the presence of an acute pulmonary embolus. Mediastinum/Nodes: No mediastinal lymphadenopathy. There is no hilar lymphadenopathy. The esophagus has normal imaging features. There is no axillary lymphadenopathy. Lungs/Pleura: No focal airspace consolidation. Scattered areas of mosaic attenuation in the lung parenchyma is nonspecific but may be related to air trapping/small airways disease. Subtle interlobular septal thickening in the upper lungs evident. No overt airspace pulmonary edema tiny bilateral pleural effusions evident. Upper Abdomen: Liver is enlarged. The liver shows diffusely decreased attenuation suggesting fat deposition. Musculoskeletal: No worrisome lytic or sclerotic osseous abnormality. Review of the MIP images confirms the above findings. IMPRESSION: 1. No CT evidence for acute pulmonary embolus. 2. Scattered areas of mosaic attenuation in the lung parenchyma is nonspecific but may  be related to air trapping/small airways disease. Component of mild pulmonary edema not excluded. 3. Tiny bilateral pleural effusions. 4. Hepatomegaly with hepatic steatosis. 5.  Aortic Atherosclerosis (ICD10-I70.0). Electronically Signed   By: EMisty StanleyM.D.   On: 08/09/2022 07:13   DG Chest Port 1 View  Result Date: 08/09/2022 CLINICAL DATA:  61year old female with flank pain, UTI, vomiting, tachycardia, hypoxia. EXAM: PORTABLE CHEST 1 VIEW COMPARISON:  Chest radiographs 11/05/2020 and earlier. FINDINGS: Portable AP view at 0534 hours. Mildly lower lung volumes. Normal cardiac size and mediastinal contours. Visualized tracheal air column is within normal limits. Allowing for portable technique the lungs are clear. Paucity of bowel gas in the visible abdomen. Negative visible osseous structures. IMPRESSION: Negative portable chest. Electronically Signed   By: HGenevie AnnM.D.   On: 08/09/2022 05:51   CT RENAL STONE STUDY  Result Date: 08/06/2022 CLINICAL DATA:  Right back pain EXAM: CT ABDOMEN AND PELVIS WITHOUT CONTRAST TECHNIQUE: Multidetector CT imaging of the abdomen and pelvis was performed following the standard protocol without IV contrast. RADIATION DOSE REDUCTION: This exam was performed according  to the departmental dose-optimization program which includes automated exposure control, adjustment of the mA and/or kV according to patient size and/or use of iterative reconstruction technique. COMPARISON:  CT abdomen and pelvis dated March 29, 2022 FINDINGS: Lower chest: Trace pericardial effusion.  No acute abnormality. Hepatobiliary: No focal liver abnormality is seen. Status post cholecystectomy. No biliary dilatation. Pancreas: Unremarkable. No pancreatic ductal dilatation or surrounding inflammatory changes. Spleen: Normal in size without focal abnormality. Adrenals/Urinary Tract: Bilateral adrenal glands are unremarkable. No hydronephrosis. Left-greater-than-right punctate nonobstructing stones.  Low-attenuation lesion of the mid region of the left kidney, likely simple cysts with no specific follow-up imaging recommended. Bladder is unremarkable. Stomach/Bowel: Stomach is within normal limits. Diverticulosis. Normal appendix. No evidence of bowel wall thickening, distention, or inflammatory changes. Vascular/Lymphatic: Aortic atherosclerosis. No enlarged abdominal or pelvic lymph nodes. Reproductive: Uterus and bilateral adnexa are unremarkable. Other: Small fat containing left inguinal hernia. No abdominopelvic ascites Musculoskeletal: No acute or significant osseous findings. IMPRESSION: 1. No acute findings in the abdomen or pelvis. 2. Left-greater-than-right punctate nonobstructing renal stones. 3. Aortic Atherosclerosis (ICD10-I70.0). Electronically Signed   By: Yetta Glassman M.D.   On: 08/06/2022 08:24    Labs:  CBC: Recent Labs    08/10/22 0423 08/11/22 0342 08/12/22 0434 08/13/22 0218  WBC 6.7 9.5 11.8* 13.1*  HGB 11.1* 11.0* 11.7* 12.8  HCT 34.4* 33.0* 36.2 37.2  PLT 90* 98* 111* 141*    COAGS: No results for input(s): "INR", "APTT" in the last 8760 hours.  BMP: Recent Labs    08/10/22 0423 08/11/22 0342 08/12/22 0434 08/13/22 0218  NA 135 134* 135 137  K 3.9 4.2 3.8 3.7  CL 100 100 100 101  CO2 20* '22 25 27  '$ GLUCOSE 270* 299* 306* 177*  BUN '16 21 16 13  '$ CALCIUM 9.6 9.2 9.1 9.2  CREATININE 0.57 0.50 0.45 0.41*  GFRNONAA >60 >60 >60 >60    LIVER FUNCTION TESTS: Recent Labs    08/10/22 0423 08/11/22 0342 08/12/22 0434 08/13/22 0218  BILITOT 1.0 0.8 0.9 0.5  AST 23 14* 14* 13*  ALT '30 22 21 17  '$ ALKPHOS 110 82 75 74  PROT 6.8 5.8* 5.9* 6.1*  ALBUMIN 3.0* 2.4* 2.6* 2.7*    TUMOR MARKERS: No results for input(s): "AFPTM", "CEA", "CA199", "CHROMGRNA" in the last 8760 hours.  Assessment and Plan:  Left renal cyst: Jocelyn Lamer L. Raymond Gurney, 61 year old female, is tentatively scheduled 08/14/22 for an image-guided left renal cyst  aspiration.  Risks and benefits discussed with the patient including bleeding, infection, damage to adjacent structures and sepsis.  All of the patient's questions were answered, patient is agreeable to proceed. Last dose of 81 mg aspirin was 08/13/22 at 1011. Last dose of lovenox was 08/12/22 at 1817. She will be NPO at midnight. Morning labs ordered: CBC and PT/INR.   Consent signed and in IR.   Thank you for this interesting consult.  I greatly enjoyed meeting GRISEL BLUMENSTOCK and look forward to participating in their care.  A copy of this report was sent to the requesting provider on this date.  Electronically Signed: Soyla Dryer, AGACNP-BC [phone] 08/13/2022, 2:42 PM   I spent a total of 20 Minutes    in face to face in clinical consultation, greater than 50% of which was counseling/coordinating care for renal cyst aspiration

## 2022-08-13 NOTE — Progress Notes (Signed)
Patient ID: Courtney Parks, female   DOB: August 30, 1961, 61 y.o.   MRN: [id number]    Subjective: WBC still trending up.  Clinically stable without new complaints.   Objective: Vital signs in last 24 hours: Temp:  [98 F (36.7 C)-98.2 F (36.8 C)] 98 F (36.7 C) (12/12 0600) Pulse Rate:  [74] 74 (12/12 0600) Resp:  [17] 17 (12/12 0600) BP: (168-172)/(84-88) 168/84 (12/12 0600) SpO2:  [96 %] 96 % (12/12 0600)  Intake/Output from previous day: 12/11 0701 - 12/12 0700 In: 100 [IV Piggyback:100] Out: -  Intake/Output this shift: No intake/output data recorded.    Lab Results: Recent Labs    08/11/22 0342 08/12/22 0434 08/13/22 0218  HGB 11.0* 11.7* 12.8  HCT 33.0* 36.2 37.2      Latest Ref Rng & Units 08/13/2022    2:18 AM 08/12/2022    4:34 AM 08/11/2022    3:42 AM  CBC  WBC 4.0 - 10.5 K/uL 13.1  11.8  9.5   Hemoglobin 12.0 - 15.0 g/dL 12.8  11.7  11.0   Hematocrit 36.0 - 46.0 % 37.2  36.2  33.0   Platelets 150 - 400 K/uL 141  111  98      BMET Recent Labs    08/12/22 0434 08/13/22 0218  NA 135 137  K 3.8 3.7  CL 100 101  CO2 25 27  GLUCOSE 306* 177*  BUN 16 13  CREATININE 0.45 0.41*  CALCIUM 9.1 9.2     Studies/Results: CT ABDOMEN W WO CONTRAST  Result Date: 08/12/2022 CLINICAL DATA:  Follow-up LEFT renal mass, indeterminate renal mass in a 61 year old female. EXAM: CT ABDOMEN WITHOUT AND WITH CONTRAST TECHNIQUE: Multidetector CT imaging of the abdomen was performed following the standard protocol before and following the bolus administration of intravenous contrast. RADIATION DOSE REDUCTION: This exam was performed according to the departmental dose-optimization program which includes automated exposure control, adjustment of the mA and/or kV according to patient size and/or use of iterative reconstruction technique. CONTRAST:  30m OMNIPAQUE IOHEXOL 350 MG/ML SOLN COMPARISON:  August 09, 2022 FINDINGS: Lower chest: Incidental imaging of the  lung bases with small effusions and basilar atelectasis. Slightly increased since previous imaging. Hepatobiliary: Lobular hepatic contours with fissural widening of hepatic fissures. No focal, suspicious hepatic lesion. Portal vein is patent. Post cholecystectomy without signs of biliary duct dilation. Pancreas: Mild atrophy of the pancreas without ductal dilation, inflammation or visible lesion. Spleen: Normal. Adrenals/Urinary Tract: Adrenal glands are normal. Low-attenuation lesion in the posterior lower-interpolar LEFT kidney measuring 2.0 x 1.8 cm previously 2.2 x 1.8 cm as measured on the MRI evaluation. On noncontrast imaging the area appears little changed compared to imaging from July 10, 2021. Some hypoattenuation surrounding the lesion also little changed compared to previous imaging. Small tract extending towards the capsule is similar without signs of perinephric fluid collection. Scattered subcentimeter hypodensities in the bilateral kidneys compatible with Bosniak category 2 lesions are similarly unchanged. Mild peripelvic stranding on the LEFT. Renal veins are patent. Stomach/Bowel: No acute gastrointestinal findings. Pelvic bowel loops are not imaged. Vascular/Lymphatic: Calcified and noncalcified atheromatous plaque of the abdominal aorta. There is no gastrohepatic or hepatoduodenal ligament lymphadenopathy. No retroperitoneal or mesenteric lymphadenopathy. Other: No ascites Musculoskeletal: No acute or significant osseous findings. IMPRESSION: 1. Findings that are most suspicious for a superinfected hemorrhagic cyst of the lower pole the LEFT kidney. Renal neoplasm is felt less likely but would suggest follow-up in 6-8 weeks to ensure resolution. 2. Mild peripelvic  stranding on the LEFT. Likely related underlying urinary tract infection. 3. Lobular hepatic contours with fissural widening of hepatic fissures. Correlate with any clinical or laboratory evidence of liver disease. 4. Post  cholecystectomy without signs of biliary duct dilation. 5. Small bilateral pleural effusions and basilar atelectasis. Slightly increased since previous imaging. 6. Aortic atherosclerosis. Aortic Atherosclerosis (ICD10-I70.0). Electronically Signed   By: Zetta Bills M.D.   On: 08/12/2022 08:44    Assessment/Plan: 1) Left renal lesion/UTI: Imaging findings are most consistent with an infected renal cyst.  WBC trending upward despite appropriate lipophilic antibiotic therapy.  Will consult IR this morning to see if drainage/aspiration can be done later today.  Continue NPO status for now.   LOS: 4 days   Dutch Gray 08/13/2022, 7:22 AM

## 2022-08-13 NOTE — Assessment & Plan Note (Addendum)
Urology suspect this is an abscess vs a hemorrhage into a small renal cyst.   - Consult IR for aspiration today - Follow aspirate culture

## 2022-08-13 NOTE — Progress Notes (Signed)
Physical Therapy Treatment Patient Details Name: Courtney Parks MRN: [id number] DOB: 1961-01-05 Today's Date: 08/13/2022   History of Present Illness Pt is a 61 y/o F presenting to ED on 12/5 with urinary frequency and R back pain, dx with UTI, and d/c'd. Returned to ED on 12/6 with vomiting and R hip pain. CT revealing L complex renal cyst concerning for infectious cyst/abscess. PMHi ncludes DM, RLE foot wound s/p I & D 04/2022, HLD, HTN, and anxiety/depression    PT Comments    Patient able to maneuver wheelchair in hallway though legrests at home she held legs up and maneuvered with her hands.  She also ambulated with RW in the room with minguard to S assist.  She has wheelchair that needs brake and wheel adjustments.  She reports her family is working on this.  PT will continue to follow.    Recommendations for follow up therapy are one component of a multi-disciplinary discharge planning process, led by the attending physician.  Recommendations may be updated based on patient status, additional functional criteria and insurance authorization.  Follow Up Recommendations  No PT follow up     Assistance Recommended at Discharge Intermittent Supervision/Assistance  Patient can return home with the following A little help with walking and/or transfers;A little help with bathing/dressing/bathroom;Assistance with cooking/housework;Assist for transportation;Help with stairs or ramp for entrance   Equipment Recommendations       Recommendations for Other Services       Precautions / Restrictions Precautions Precautions: Fall Restrictions Weight Bearing Restrictions: Yes RLE Weight Bearing: Non weight bearing     Mobility  Bed Mobility Overal bed mobility: Modified Independent                  Transfers Overall transfer level: Needs assistance Equipment used: None Transfers: Bed to chair/wheelchair/BSC Sit to Stand: Supervision     Squat pivot transfers:  Supervision     General transfer comment: assist for wheelchair stability    Ambulation/Gait Ambulation/Gait assistance: Supervision, Min guard Gait Distance (Feet): 20 Feet Assistive device: Rolling walker (2 wheels) Gait Pattern/deviations: Step-to pattern       General Gait Details: occasionally hops at home, but using wheelchiar mainly, walked in room with RW and minguard to curtain and back to bed   Secretary/administrator Wheelchair mobility: Yes Wheelchair propulsion: Both upper extremities Wheelchair parts: Needs assistance Distance: 220' Wheelchair Assistance Details (indicate cue type and reason): held wheelchair due to brakes not locking well  Modified Rankin (Stroke Patients Only)       Balance Overall balance assessment: Needs assistance Sitting-balance support: Feet supported Sitting balance-Leahy Scale: Good     Standing balance support: Bilateral upper extremity supported Standing balance-Leahy Scale: Poor Standing balance comment: standing with RW NWB on R                            Cognition Arousal/Alertness: Awake/alert Behavior During Therapy: WFL for tasks assessed/performed Overall Cognitive Status: Within Functional Limits for tasks assessed                                          Exercises Other Exercises Other Exercises: reports doing leg lifts, ankle pumps and heel slides in bed    General Comments  Pertinent Vitals/Pain Pain Assessment Pain Assessment: Faces Faces Pain Scale: Hurts little more Pain Location: R hip at times with positioning or use Pain Descriptors / Indicators: Aching, Discomfort Pain Intervention(s): Monitored during session    Home Living                          Prior Function            PT Goals (current goals can now be found in the care plan section) Progress towards PT goals: Progressing toward goals     Frequency           PT Plan Current plan remains appropriate    Co-evaluation              AM-PAC PT "6 Clicks" Mobility   Outcome Measure  Help needed turning from your back to your side while in a flat bed without using bedrails?: None Help needed moving from lying on your back to sitting on the side of a flat bed without using bedrails?: None Help needed moving to and from a bed to a chair (including a wheelchair)?: A Little Help needed standing up from a chair using your arms (e.g., wheelchair or bedside chair)?: None Help needed to walk in hospital room?: A Little Help needed climbing 3-5 steps with a railing? : A Little 6 Click Score: 21    End of Session Equipment Utilized During Treatment: Gait belt Activity Tolerance: Patient tolerated treatment well Patient left: in bed;with call bell/phone within reach;with bed alarm set   PT Visit Diagnosis: Unsteadiness on feet (R26.81);Difficulty in walking, not elsewhere classified (R26.2)     Time: 0539-7673 PT Time Calculation (min) (ACUTE ONLY): 18 min  Charges:  $Wheel Chair Management: 8-22 mins                     Magda Kiel, PT Acute Rehabilitation Services Office:[phone] 08/13/2022    Reginia Naas 08/13/2022, 2:50 PM

## 2022-08-13 NOTE — Hospital Course (Addendum)
Courtney Parks is a 61 y.o. 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      12/5: Seen in the ER for urinary irritative symptoms, diagnosed cystitis, discharged on Keflex 12/6: Returned with N/V, rehydrated, discharged home again with Keflex  12/8: Returned again with N/V; found to be COVID+; CT done, showed ?abscess on kidney --> Admitted on antibiotics  12/9: Urology consulted, recommended antibiotic treatment 12/12: IR consulted 12/13: Cyst aspirated by IR

## 2022-08-13 NOTE — Progress Notes (Signed)
  Progress Note   Patient: Courtney Parks OHY:[id number] DOB: 08/20/1961 DOA: 08/09/2022     4 DOS: the patient was seen and examined on 08/13/2022 at 11:57AM      Brief hospital course: ANJELICA GORNIAK is a 61 y.o. female with medical history significant of DM, HTN, HLD, and depression/anxiety presenting with n/v.  She was seen in the ER on 12/5 with urinary frequency and treated with Rocephin -> Keflex.  She returned on 12/6 with n/v; she was rehydrated and again discharged home with Keflex.   She continued to have n/v and returned to [place] last night.  She reports having severe R hip pain which she likens to kidney stone pain and childbirth.  She also had n/v, last this AM.  She had not noticed SOB but was on O2 in the ER on 12/6.  She began feeling SOB last night.  +COVID but also found incidentially to have a ? Renal abscess.   She has a h/o R foot surgery in August and is fully non-weight bearing on that foot.      Assessment and Plan: * UTI (urinary tract infection) - Continue Rocephin  Renal mass, left Urology suspect this is a hemorrhage into a small renal cyst.  Possibly superinfected. - Consult Urology - Consult IR for aspiration tomorow  Pneumonia due to COVID-19 virus Acute respiratory failure ruled out. Weaned to room air - Continue Paxlovid - Stop steroids  Type 2 diabetes mellitus without complications (HCC) Glucose elevated - Stop steroids - Continue glargine - Start mealtime insulin - Continue SS corrections          Subjective: Feels well.  No dyspnea, cough, chest pain, flank pain, shortness of breath, fever.     Physical Exam: BP (!) 168/86 (BP Location: Right Arm)   Pulse 70   Temp 98.6 F (37 C)   Resp 18   Ht '5\' 7"'$  (1.702 m)   Wt 81.2 kg   SpO2 96%   BMI 28.04 kg/m   Adult female, lying in bed, no acute distress RRR, no murmurs, no peripheral edema Respiratory normal, lungs clear without rales or  wheezes Abdomen soft without tenderness palpation or guarding, no distention Details of the right foot have some evidence of cellulitis.  Data Reviewed: Comprehensive metabolic panel normal CBC shows a mild leukocytosis due to steroids CT shows superinfected hemorrhagic cyst  Family Communication: None present    Disposition: Status is: Inpatient Patient is improving from the standpoint of COVID  We will plan for aspiration of her cyst given concern that this is superinfected.  Afterwards, possibly home        Author: Edwin Dada, MD 08/13/2022 2:41 PM  For on call review [email or web address].

## 2022-08-13 NOTE — Assessment & Plan Note (Signed)
-   Continue antibiotics, transition back to ciprofloxacin - Aspirate cyst today

## 2022-08-13 NOTE — Assessment & Plan Note (Signed)
Glucose improved with stopping steroids - Continue glargine - Continue mealtime insulin - Continue SS corrections

## 2022-08-13 NOTE — Assessment & Plan Note (Addendum)
Acute respiratory failure ruled out. Weaned to room air, steroids stopped - Continue Paxlovid

## 2022-08-13 NOTE — Progress Notes (Signed)
Occupational Therapy Treatment Patient Details Name: Courtney Parks MRN: [id number] DOB: 09-01-1961 Today's Date: 08/13/2022   History of present illness Pt is a 61 y/o F presenting to ED on 12/5 with urinary frequency and R back pain, dx with UTI, and d/c'd. Returned to ED on 12/6 with vomiting and R hip pain. CT revealing L complex renal cyst concerning for infectious cyst/abscess. PMHi ncludes DM, RLE foot wound s/p I & D 04/2022, HLD, HTN, and anxiety/depression   OT comments  Patient making good progress with functional transfers. Patient demonstrated transfers to 3n1 from EOB and with 3n1 in shower for shower transfers with patient adhering to WB precautions. Patient performed BUE strengthening exercises with red therapy band and monitored patients SpO2 on RA with patient demonstrated 93-9%. Patient to continue with acute OT to address goals for functional transfers.    Recommendations for follow up therapy are one component of a multi-disciplinary discharge planning process, led by the attending physician.  Recommendations may be updated based on patient status, additional functional criteria and insurance authorization.    Follow Up Recommendations  No OT follow up     Assistance Recommended at Discharge Set up Supervision/Assistance  Patient can return home with the following  A little help with walking and/or transfers;A little help with bathing/dressing/bathroom;Assistance with cooking/housework;Assist for transportation;Help with stairs or ramp for entrance   Equipment Recommendations  None recommended by OT (patient has all needed DME)    Recommendations for Other Services      Precautions / Restrictions Precautions Precautions: Fall Precaution Comments: COVID+ Restrictions Weight Bearing Restrictions: Yes RLE Weight Bearing: Non weight bearing       Mobility Bed Mobility Overal bed mobility: Modified Independent                   Transfers Overall transfer level: Needs assistance Equipment used: None Transfers: Bed to chair/wheelchair/BSC Sit to Stand: Supervision   Squat pivot transfers: Supervision       General transfer comment: patient performed transfers to [facility] from EOB and with BSC in shower     Balance Overall balance assessment: Needs assistance Sitting-balance support: Feet supported Sitting balance-Leahy Scale: Good     Standing balance support: Bilateral upper extremity supported Standing balance-Leahy Scale: Poor Standing balance comment: stood fro shower transfer with use of rails                           ADL either performed or assessed with clinical judgement   ADL Overall ADL's : Needs assistance/impaired                         Toilet Transfer: Armed forces technical officer Details (indicate cue type and reason): patient performed transfer from EOB to [facility]     Tub/ Shower Transfer: Walk-in Armed forces operational officer Details (indicate cue type and reason): patient used grab bars to assist with standing and pivoting to [facility] in shower   General ADL Comments: Patient making good progress with functional transfers.    Extremity/Trunk Assessment              Vision       Perception     Praxis      Cognition Arousal/Alertness: Awake/alert Behavior During Therapy: WFL for tasks assessed/performed Overall Cognitive Status: Within Functional Limits for tasks assessed  Exercises Exercises: General Upper Extremity General Exercises - Upper Extremity Shoulder Flexion: Strengthening, Both, 10 reps, Seated, Theraband Theraband Level (Shoulder Flexion): Level 2 (Red) Shoulder ABduction: Strengthening, Both, 10 reps, Seated, Theraband Theraband Level (Shoulder Abduction): Level 2 (Red) Elbow Flexion: Strengthening, Both, 10 reps, Seated,  Theraband Theraband Level (Elbow Flexion): Level 2 (Red) Elbow Extension: Strengthening, Both, 10 reps, Seated, Theraband Theraband Level (Elbow Extension): Level 2 (Red)    Shoulder Instructions       General Comments Patient on RA during visit with SpO2 ranging from 93-96%    Pertinent Vitals/ Pain       Pain Assessment Pain Assessment: Faces Faces Pain Scale: Hurts little more Pain Location: R hip Pain Descriptors / Indicators: Aching, Discomfort Pain Intervention(s): Monitored during session  Home Living                                          Prior Functioning/Environment              Frequency  Min 2X/week        Progress Toward Goals  OT Goals(current goals can now be found in the care plan section)  Progress towards OT goals: Progressing toward goals  Acute Rehab OT Goals Patient Stated Goal: get better OT Goal Formulation: With patient Time For Goal Achievement: 08/24/22 Potential to Achieve Goals: Good ADL Goals Pt Will Transfer to Toilet: Independently;squat pivot transfer;stand pivot transfer;bedside commode Pt Will Perform Tub/Shower Transfer: Tub transfer;Shower transfer;Independently;Stand pivot transfer;Squat pivot transfer;shower seat;tub bench  Plan Discharge plan remains appropriate    Co-evaluation                 AM-PAC OT "6 Clicks" Daily Activity     Outcome Measure   Help from another person eating meals?: None Help from another person taking care of personal grooming?: None Help from another person toileting, which includes using toliet, bedpan, or urinal?: A Little Help from another person bathing (including washing, rinsing, drying)?: A Little Help from another person to put on and taking off regular upper body clothing?: A Little Help from another person to put on and taking off regular lower body clothing?: A Little 6 Click Score: 20    End of Session Equipment Utilized During Treatment: Other  (comment) (BSC)  OT Visit Diagnosis: Unsteadiness on feet (R26.81);Other abnormalities of gait and mobility (R26.89);Muscle weakness (generalized) (M62.81)   Activity Tolerance Patient tolerated treatment well   Patient Left in bed;with call bell/phone within reach   Nurse Communication Mobility status (SpO2 sats)        Time: 9935-7017 OT Time Calculation (min): 21 min  Charges: OT General Charges $OT Visit: 1 Visit OT Treatments $Self Care/Home Management : 8-22 mins  Lodema Hong, [place]  Office [phone]   Trixie Dredge 08/13/2022, 3:10 PM

## 2022-08-14 ENCOUNTER — Inpatient Hospital Stay (HOSPITAL_COMMUNITY): Payer: 59

## 2022-08-14 DIAGNOSIS — E119 Type 2 diabetes mellitus without complications: Secondary | ICD-10-CM

## 2022-08-14 DIAGNOSIS — J1282 Pneumonia due to coronavirus disease 2019: Secondary | ICD-10-CM

## 2022-08-14 DIAGNOSIS — N2889 Other specified disorders of kidney and ureter: Secondary | ICD-10-CM

## 2022-08-14 DIAGNOSIS — Z794 Long term (current) use of insulin: Secondary | ICD-10-CM

## 2022-08-14 LAB — CBC WITH DIFFERENTIAL/PLATELET
Abs Immature Granulocytes: 1.05 10*3/uL — ABNORMAL HIGH (ref 0.00–0.07)
Basophils Absolute: 0.1 10*3/uL (ref 0.0–0.1)
Basophils Relative: 1 %
Eosinophils Absolute: 0.1 10*3/uL (ref 0.0–0.5)
Eosinophils Relative: 1 %
HCT: 40.4 % (ref 36.0–46.0)
Hemoglobin: 13.3 g/dL (ref 12.0–15.0)
Immature Granulocytes: 5 %
Lymphocytes Relative: 26 %
Lymphs Abs: 5.3 10*3/uL — ABNORMAL HIGH (ref 0.7–4.0)
MCH: 27.5 pg (ref 26.0–34.0)
MCHC: 32.9 g/dL (ref 30.0–36.0)
MCV: 83.6 fL (ref 80.0–100.0)
Monocytes Absolute: 1.6 10*3/uL — ABNORMAL HIGH (ref 0.1–1.0)
Monocytes Relative: 8 %
Neutro Abs: 12.3 10*3/uL — ABNORMAL HIGH (ref 1.7–7.7)
Neutrophils Relative %: 59 %
Platelets: 211 10*3/uL (ref 150–400)
RBC: 4.83 MIL/uL (ref 3.87–5.11)
RDW: 15.1 % (ref 11.5–15.5)
WBC: 20.5 10*3/uL — ABNORMAL HIGH (ref 4.0–10.5)
nRBC: 0 % (ref 0.0–0.2)

## 2022-08-14 LAB — CULTURE, BLOOD (ROUTINE X 2)
Culture: NO GROWTH
Culture: NO GROWTH
Special Requests: ADEQUATE
Special Requests: ADEQUATE

## 2022-08-14 LAB — COMPREHENSIVE METABOLIC PANEL
ALT: 18 U/L (ref 0–44)
AST: 15 U/L (ref 15–41)
Albumin: 2.6 g/dL — ABNORMAL LOW (ref 3.5–5.0)
Alkaline Phosphatase: 63 U/L (ref 38–126)
Anion gap: 9 (ref 5–15)
BUN: 20 mg/dL (ref 8–23)
CO2: 28 mmol/L (ref 22–32)
Calcium: 8.8 mg/dL — ABNORMAL LOW (ref 8.9–10.3)
Chloride: 99 mmol/L (ref 98–111)
Creatinine, Ser: 0.45 mg/dL (ref 0.44–1.00)
GFR, Estimated: >60 mL/min (ref >60)
Glucose, Bld: 102 mg/dL — ABNORMAL HIGH (ref 70–99)
Potassium: 3.5 mmol/L (ref 3.5–5.1)
Sodium: 136 mmol/L (ref 135–145)
Total Bilirubin: 0.3 mg/dL (ref 0.3–1.2)
Total Protein: 5.4 g/dL — ABNORMAL LOW (ref 6.5–8.1)

## 2022-08-14 LAB — PROTIME-INR
INR: 1.1 (ref 0.8–1.2)
Prothrombin Time: 13.9 seconds (ref 11.4–15.2)

## 2022-08-14 LAB — GLUCOSE, CAPILLARY
Glucose-Capillary: 85 mg/dL (ref 70–99)
Glucose-Capillary: 89 mg/dL (ref 70–99)
Glucose-Capillary: 75 mg/dL (ref 70–99)
Glucose-Capillary: 116 mg/dL — ABNORMAL HIGH (ref 70–99)

## 2022-08-14 MED ORDER — FLUMAZENIL 0.5 MG/5ML IV SOLN
INTRAVENOUS | Status: AC
  Filled 2022-08-14: qty 5

## 2022-08-14 MED ORDER — CIPROFLOXACIN IN D5W 400 MG/200ML IV SOLN
400.0000 mg | Freq: Two times a day (BID) | INTRAVENOUS | Status: DC
  Administered 2022-08-14 – 2022-08-15 (×3): 400 mg via INTRAVENOUS
  Filled 2022-08-14 (×3): qty 200

## 2022-08-14 MED ORDER — MIDAZOLAM HCL 2 MG/2ML IJ SOLN
INTRAMUSCULAR | Status: AC
  Filled 2022-08-14: qty 4

## 2022-08-14 MED ORDER — NALOXONE HCL 0.4 MG/ML IJ SOLN
INTRAMUSCULAR | Status: AC
  Filled 2022-08-14: qty 1

## 2022-08-14 MED ORDER — MIDAZOLAM HCL 2 MG/2ML IJ SOLN
INTRAMUSCULAR | Status: AC | PRN
  Administered 2022-08-14 (×2): .5 mg via INTRAVENOUS

## 2022-08-14 MED ORDER — FENTANYL CITRATE (PF) 100 MCG/2ML IJ SOLN
INTRAMUSCULAR | Status: AC
  Filled 2022-08-14: qty 4

## 2022-08-14 MED ORDER — FENTANYL CITRATE (PF) 100 MCG/2ML IJ SOLN
INTRAMUSCULAR | Status: AC | PRN
  Administered 2022-08-14 (×2): 25 ug via INTRAVENOUS

## 2022-08-14 NOTE — Progress Notes (Signed)
Patient ID: Courtney Parks, female   DOB: [birth year], 61 y.o.   MRN: [id number]     Subjective: Pt without new complaints.  Still mild left flank pain but very minimal.  Complains of more right sided abdominal pain (3-4 out of 10).  Objective: Vital signs in last 24 hours: Temp:  [98.2 F (36.8 C)-98.6 F (37 C)] 98.2 F (36.8 C) (12/13 0458) Pulse Rate:  [70-74] 74 (12/13 0458) Resp:  [16-18] 16 (12/13 0458) BP: (140-168)/(72-86) 140/75 (12/13 0458) SpO2:  [96 %-98 %] 98 % (12/13 0458)  Intake/Output from previous day: No intake/output data recorded. Intake/Output this shift: No intake/output data recorded.  Physical Exam:  General: Alert and oriented Abd: Mild L CVAT  Lab Results: Recent Labs    08/12/22 0434 08/13/22 0218 08/14/22 0309  HGB 11.7* 12.8 13.3  HCT 36.2 37.2 40.4      Latest Ref Rng & Units 08/14/2022    3:09 AM 08/13/2022    2:18 AM 08/12/2022    4:34 AM  CBC  WBC 4.0 - 10.5 K/uL 20.5  13.1  11.8   Hemoglobin 12.0 - 15.0 g/dL 13.3  12.8  11.7   Hematocrit 36.0 - 46.0 % 40.4  37.2  36.2   Platelets 150 - 400 K/uL 211  141  111      BMET Recent Labs    08/13/22 0218 08/14/22 0309  NA 137 136  K 3.7 3.5  CL 101 99  CO2 27 28  GLUCOSE 177* 102*  BUN 13 20  CREATININE 0.41* 0.45  CALCIUM 9.2 8.8*     Studies/Results: CT ABDOMEN W WO CONTRAST  Result Date: 08/12/2022 CLINICAL DATA:  Follow-up LEFT renal mass, indeterminate renal mass in a 61 year old female. EXAM: CT ABDOMEN WITHOUT AND WITH CONTRAST TECHNIQUE: Multidetector CT imaging of the abdomen was performed following the standard protocol before and following the bolus administration of intravenous contrast. RADIATION DOSE REDUCTION: This exam was performed according to the departmental dose-optimization program which includes automated exposure control, adjustment of the mA and/or kV according to patient size and/or use of iterative reconstruction technique. CONTRAST:   38m OMNIPAQUE IOHEXOL 350 MG/ML SOLN COMPARISON:  August 09, 2022 FINDINGS: Lower chest: Incidental imaging of the lung bases with small effusions and basilar atelectasis. Slightly increased since previous imaging. Hepatobiliary: Lobular hepatic contours with fissural widening of hepatic fissures. No focal, suspicious hepatic lesion. Portal vein is patent. Post cholecystectomy without signs of biliary duct dilation. Pancreas: Mild atrophy of the pancreas without ductal dilation, inflammation or visible lesion. Spleen: Normal. Adrenals/Urinary Tract: Adrenal glands are normal. Low-attenuation lesion in the posterior lower-interpolar LEFT kidney measuring 2.0 x 1.8 cm previously 2.2 x 1.8 cm as measured on the MRI evaluation. On noncontrast imaging the area appears little changed compared to imaging from July 10, 2021. Some hypoattenuation surrounding the lesion also little changed compared to previous imaging. Small tract extending towards the capsule is similar without signs of perinephric fluid collection. Scattered subcentimeter hypodensities in the bilateral kidneys compatible with Bosniak category 2 lesions are similarly unchanged. Mild peripelvic stranding on the LEFT. Renal veins are patent. Stomach/Bowel: No acute gastrointestinal findings. Pelvic bowel loops are not imaged. Vascular/Lymphatic: Calcified and noncalcified atheromatous plaque of the abdominal aorta. There is no gastrohepatic or hepatoduodenal ligament lymphadenopathy. No retroperitoneal or mesenteric lymphadenopathy. Other: No ascites Musculoskeletal: No acute or significant osseous findings. IMPRESSION: 1. Findings that are most suspicious for a superinfected hemorrhagic cyst of the lower pole the LEFT kidney. Renal  neoplasm is felt less likely but would suggest follow-up in 6-8 weeks to ensure resolution. 2. Mild peripelvic stranding on the LEFT. Likely related underlying urinary tract infection. 3. Lobular hepatic contours with fissural  widening of hepatic fissures. Correlate with any clinical or laboratory evidence of liver disease. 4. Post cholecystectomy without signs of biliary duct dilation. 5. Small bilateral pleural effusions and basilar atelectasis. Slightly increased since previous imaging. 6. Aortic atherosclerosis. Aortic Atherosclerosis (ICD10-I70.0). Electronically Signed   By: Zetta Bills M.D.   On: 08/12/2022 08:44    Assessment/Plan: 1) Left renal lesion/UTI:  IR to aspirate/drain left renal cystic lesion today that appears suspicious for an infected cyst.  Continue ciprofloxacin.  If WBC does not improve following cyst aspiration, it would be important to consider an alternative infectious source.   LOS: 5 days   Dutch Gray 08/14/2022, 7:02 AM

## 2022-08-14 NOTE — Procedures (Signed)
Interventional Radiology Procedure Note  Procedure: CT ASP LEFT RENAL ABSCESS    Complications: None  Estimated Blood Loss:  MIN  Findings: 6CC DARK BLOOD ASPIRATED C/W HEMORRHAGIC CYST.  NO PUS.   CX Christene Lye, MD

## 2022-08-14 NOTE — Progress Notes (Signed)
PT Cancellation Note  Patient Details Name: Courtney Parks MRN: [id number] DOB: 12-18-1960   Cancelled Treatment:    Reason Eval/Treat Not Completed: Patient declined, no reason specified; reports feeling weak with NPO status with procedure planned for today.  Will attempt another day.   Reginia Naas 08/14/2022, 10:14 AM Magda Kiel, PT Acute [facility] Services Office:[phone] 08/14/2022

## 2022-08-14 NOTE — Progress Notes (Signed)
  Progress Note   Patient: Courtney Parks QIO:[id number] DOB: 05/01/1961 DOA: 08/09/2022     5 DOS: the patient was seen and examined on 08/14/2022 at 9:07AM      Brief hospital course: Mrs. Banos is a 61 y.o. 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      12/5: Seen in the ER for urinary irritative symptoms, diagnosed cystitis, discharged on Keflex 12/6: Returned with N/V, rehydrated, discharged home again with Keflex  12/8: Returned again with N/V; found to be COVID+; CT done, showed ?abscess on kidney --> Admitted on antibiotics  12/9: Urology consulted, recommended antibiotic treatment 12/12: IR consulted 12/13: Cyst aspirated by IR     Assessment and Plan: * UTI (urinary tract infection) - Continue antibiotics, transition back to ciprofloxacin - Aspirate cyst today  Renal mass, left Urology suspect this is an abscess vs a hemorrhage into a small renal cyst.   - Consult IR for aspiration today - Follow aspirate culture  Pneumonia due to COVID-19 virus Acute respiratory failure ruled out. Weaned to room air, steroids stopped - Continue Paxlovid  Type 2 diabetes mellitus without complications (HCC) Glucose improved with stopping steroids - Continue glargine - Continue mealtime insulin - Continue SS corrections          Subjective: Patient feels well, discouraged by being in the hospital and that her white count is up.  No fever, confusion, respiratory symptoms.     Physical Exam: BP 108/62 (BP Location: Right Arm)   Pulse 63   Temp 97.9 F (36.6 C) (Oral)   Resp 11   Ht '5\' 7"'$  (1.702 m)   Wt 81.2 kg   SpO2 100%   BMI 28.04 kg/m   Adult female, lying in bed, no acute distress RRR, no murmurs, no peripheral edema Respiratory rate normal, lungs clear without rales or wheezes Abdomen soft without tenderness palpation or guarding, no ascites or  distention Attention normal, affect appropriate, judgment and insight appear normal    Data Reviewed: Discussed with urology Interventional radiology notes reviewed Glucose is mostly under 200 White blood cell count up to 20 Comprehensive metabolic panel unremarkable  Family Communication: None at the bedside    Disposition: Status is: Inpatient The patient presented with pyelonephritis from infected renal cyst  Will aspirate the cyst today  If her white count is coming down tomorrow, likely the cyst is same as her recent urine culture and I will plan to discharge with Bactrim  If her white count continues to trend up we will need to evaluate for other sources of infection        Author: Edwin Dada, MD 08/14/2022 1:53 PM  For on call review [email or web address].

## 2022-08-15 ENCOUNTER — Other Ambulatory Visit (HOSPITAL_COMMUNITY): Payer: Self-pay

## 2022-08-15 LAB — CBC
HCT: 39.5 % (ref 36.0–46.0)
Hemoglobin: 12.7 g/dL (ref 12.0–15.0)
MCH: 27.7 pg (ref 26.0–34.0)
MCHC: 32.2 g/dL (ref 30.0–36.0)
MCV: 86.1 fL (ref 80.0–100.0)
Platelets: 192 10*3/uL (ref 150–400)
RBC: 4.59 MIL/uL (ref 3.87–5.11)
RDW: 15.4 % (ref 11.5–15.5)
WBC: 18.1 10*3/uL — ABNORMAL HIGH (ref 4.0–10.5)
nRBC: 0 % (ref 0.0–0.2)

## 2022-08-15 LAB — GLUCOSE, CAPILLARY: Glucose-Capillary: 90 mg/dL (ref 70–99)

## 2022-08-15 MED ORDER — CIPROFLOXACIN HCL 250 MG PO TABS
250.0000 mg | ORAL_TABLET | Freq: Two times a day (BID) | ORAL | 0 refills | Status: DC
  Filled 2022-08-15: qty 14, 7d supply, fill #0

## 2022-08-15 MED ORDER — CIPROFLOXACIN HCL 250 MG PO TABS
250.0000 mg | ORAL_TABLET | Freq: Two times a day (BID) | ORAL | 0 refills | Status: AC
  Filled 2022-08-15: qty 60, 30d supply, fill #0

## 2022-08-15 NOTE — Progress Notes (Addendum)
Patient ID: Courtney Parks, female   DOB: [birth year], 61 y.o.   MRN: [id number]    Subjective: Pt s/p aspiration of left renal cystic lesion.  Per Dr. Annamaria Boots, aspiration revealed blood but no frank pus. Feeling good overall.  Right sided abdominal pain improved.    Objective: Vital signs in last 24 hours: Temp:  [97.9 F (36.6 C)-98 F (36.7 C)] 97.9 F (36.6 C) (12/14 0458) Pulse Rate:  [63-80] 75 (12/14 0458) Resp:  [10-19] 17 (12/14 0458) BP: (108-143)/(62-78) 115/78 (12/14 0458) SpO2:  [94 %-100 %] 95 % (12/14 0458)  Intake/Output from previous day: 12/13 0701 - 12/14 0700 In: 880 [P.O.:480; IV Piggyback:400] Out: 400 [Urine:400] Intake/Output this shift: Total I/O In: 291.9 [IV Piggyback:291.9] Out: -   Physical Exam:  General: Alert and oriented   Lab Results: Recent Labs    08/13/22 0218 08/14/22 0309 08/15/22 0252  HGB 12.8 13.3 12.7  HCT 37.2 40.4 39.5      Latest Ref Rng & Units 08/15/2022    2:52 AM 08/14/2022    3:09 AM 08/13/2022    2:18 AM  CBC  WBC 4.0 - 10.5 K/uL 18.1  20.5  13.1   Hemoglobin 12.0 - 15.0 g/dL 12.7  13.3  12.8   Hematocrit 36.0 - 46.0 % 39.5  40.4  37.2   Platelets 150 - 400 K/uL 192  211  141      BMET Recent Labs    08/13/22 0218 08/14/22 0309  NA 137 136  K 3.7 3.5  CL 101 99  CO2 27 28  GLUCOSE 177* 102*  BUN 13 20  CREATININE 0.41* 0.45  CALCIUM 9.2 8.8*     Studies/Results: CT GUIDED NEEDLE PLACEMENT  Result Date: 08/14/2022 INDICATION: Concern for renal abscess, Klebsiella bacteremia EXAM: CT-GUIDED RENAL CYSTIC LESION ASPIRATION MEDICATIONS: The patient is currently admitted to the hospital and receiving intravenous antibiotics. The antibiotics were administered within an appropriate time frame prior to the initiation of the procedure. ANESTHESIA/SEDATION: Moderate (conscious) sedation was employed during this procedure. A total of Versed 1.0 mg and Fentanyl 50 mcg was administered intravenously by  the radiology nurse. Total intra-service moderate Sedation Time: 14 minutes. The patient's level of consciousness and vital signs were monitored continuously by radiology nursing throughout the procedure under my direct supervision. COMPLICATIONS: None immediate. PROCEDURE: Informed written consent was obtained from the patient after a thorough discussion of the procedural risks, benefits and alternatives. All questions were addressed. Maximal Sterile Barrier Technique was utilized including caps, mask, sterile gowns, sterile gloves, sterile drape, hand hygiene and skin antiseptic. A timeout was performed prior to the initiation of the procedure. Previous imaging reviewed. Patient position prone. Noncontrast localization CT performed. The cystic renal lesion was localized and marked for a posterior approach. Under sterile conditions and local anesthesia, an 18 gauge 15 cm access needle was advanced percutaneously from a posterior approach under CT guidance. Needle position confirmed with CT. Syringe aspiration yielded 8 cc old dark blood. No purulent component. Needle removed. IMPRESSION: Successful CT-guided renal cystic lesion aspiration yielding dark old blood, most consistent with a hemorrhagic cyst. Sample sent for culture. Electronically Signed   By: Jerilynn Mages.  Shick M.D.   On: 08/14/2022 16:37    Assessment/Plan: 1) Left complex renal cyst: Aspiration revealed blood but no pus suggesting that it was not likely an infected cyst.  Gram stain negative but WBCs present.  From a urologic standpoint, ok for discharge home on ciprofloxacin.  I will follow up  on the culture and will plan to stop antibiotic therapy if culture is proven negative.  Will then arrange outpatient follow up in about 2 months with a repeat MRI to re-evaluate complex cyst.  Please call if further questions.  Addendum: Culture growing out Klebsiella.  Will continue ciprofloxacin for 6 weeks.    LOS: 6 days   Dutch Gray 08/15/2022, 7:00  AM

## 2022-08-15 NOTE — Discharge Summary (Signed)
Physician Discharge Summary   Patient: Courtney Parks MRN: [id number] DOB: [birth year]  Admit date:     08/09/2022  Discharge date: 08/15/22  Discharge Physician: Edwin Dada   PCP: Pcp, No     Recommendations at discharge:  Follow up with Dr. Alinda Money, Urology for renal abscess and repeat MRI abdomen      Discharge Diagnoses: Principal Problem:   Renal abscess Active Problems:   Type 2 diabetes mellitus without complications ([place])   Essential hypertension   Pneumonia due to COVID-19 virus        Hospital Course: Courtney Parks is a 61 y.o. 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      12/5: Seen in the ER for urinary irritative symptoms, diagnosed cystitis, discharged on Keflex 12/6: Returned with N/V, rehydrated, discharged home again with Keflex  12/8: Returned again with N/V; found to be COVID+; CT done, showed ?abscess on kidney --> Admitted on antibiotics  12/9: Urology consulted, recommended antibiotic treatment 12/12: IR consulted 12/13: Cyst aspirated by IR   * UTI (urinary tract infection) due to renal abscess Admitted and found to be COVID-positive, CT abdomen done showed abscess of the right kidneys, started on antibiotics.  Urology were consulted, she did not improve with antibiotic treatment and started to have worsening leukocytosis.  IR was consulted to aspirate the cyst, culture of which grew abundant Klebsiella.  Discussed antibiotics with Urology who recommended lipophilic agent like Cipro, recommended 6 weeks.  They will follow closely.        Pneumonia due to COVID-19 virus Acute respiratory failure ruled out.  Patient with infiltrates on CXR and hypoxia.    Treated with 5 days Paxlovid and weaned to room air, asymptomatic.    Type 2 diabetes mellitus without complications ([place]) Glucose improved with stopping steroids             The [place] was reviewed for this patient prior to discharge.  Consultants:  Urology Procedures performed:  Aspiration of renal cyst  Disposition: Home   DISCHARGE MEDICATION: Allergies as of 08/15/2022       Reactions   Codeine    REACTION: Nausea   Demerol Nausea And Vomiting   Other Other (See Comments)   Narcotics- drops her blood pressure   Shellfish Allergy Swelling        Medication List     STOP taking these medications    cephALEXin 500 MG capsule Commonly known as: KEFLEX       TAKE these medications    acetaminophen 500 MG tablet Commonly known as: TYLENOL Take 500 mg by mouth every 6 (six) hours as needed for moderate pain.   aspirin EC 81 MG tablet Take 81 mg by mouth daily. Swallow whole.   ciprofloxacin 250 MG tablet Commonly known as: CIPRO Take 1 tablet (250 mg total) by mouth 2 (two) times daily.   FreeStyle Libre 2 Sensor Misc 1 each by Does not apply route PRO.   ibuprofen 200 MG tablet Commonly known as: ADVIL Take 200-800 mg by mouth every 6 (six) hours as needed for moderate pain.   Magnesium 100 MG Caps Take 100 mg by mouth daily.   ondansetron 4 MG disintegrating tablet Commonly known as: ZOFRAN-ODT Take 1 tablet (4 mg total) by mouth every 8 (eight) hours as needed for nausea or vomiting.   Pen Needles 3/16" 31G X 5 MM Misc  100 each by Does not apply route 4 (four) times daily.   phenazopyridine 200 MG tablet Commonly known as: PYRIDIUM Take 1 tablet (200 mg total) by mouth 3 (three) times daily.   PROBIOTIC BLEND PO Take 1 tablet by mouth daily.   Toujeo SoloStar 300 UNIT/ML Solostar Pen Generic drug: insulin glargine (1 Unit Dial) Inject 42 Units into the skin daily in the afternoon. What changed: how much to take   Trulicity 3 KD/9.8PJ Sopn Generic drug: Dulaglutide Inject 3 mg into the skin once a week.   Turmeric 500 MG Caps Take 500 mg by mouth daily.    vitamin C 250 MG tablet Commonly known as: ASCORBIC ACID Take 250 mg by mouth daily.   zinc gluconate 50 MG tablet Take 50 mg by mouth daily.        Follow-up Information     Raynelle Bring, MD Follow up.   Specialty: Urology Contact information: [place] [address] [phone]                 Discharge Instructions     Discharge instructions   Complete by: As directed    **IMPORTANT DISCHARGE INSTRUCTIONS**   From Dr. Loleta Books: You were admitted for nausea, vomiting and dehydration Here, we found that you had COVID, and we suspected that you had also nausea and vomiting from your urinary tract infection with possibly an abscess  Here, we aspirated the cyst, and found that it was indeed growing Klebsiella  You should take the antibiotic ciprofloxacin Take Cipro 250 mg twice daily Plan for this for 6 weeks and follow up with Dr. Alinda Money Take a probiotic with the cipro to reduce diarrhea  He will arrange repeat imaging in his office at the appropriate time   Increase activity slowly   Complete by: As directed    No wound care   Complete by: As directed        Discharge Exam: Filed Weights   08/09/22 1734  Weight: 81.2 kg    General: Pt is alert, awake, not in acute distress Cardiovascular: RRR, nl S1-S2, no murmurs appreciated.   No LE edema.   Respiratory: Normal respiratory rate and rhythm.  CTAB without rales or wheezes. Abdominal: Abdomen soft and non-tender.  No distension or HSM.   Neuro/Psych: Strength symmetric in upper and lower extremities.  Judgment and insight appear normal.   Condition at discharge: good  The results of significant diagnostics from this hospitalization (including imaging, microbiology, ancillary and laboratory) are listed below for reference.   Imaging Studies: CT GUIDED NEEDLE PLACEMENT  Result Date: 08/14/2022 INDICATION: Concern for renal abscess, Klebsiella bacteremia EXAM: CT-GUIDED RENAL  CYSTIC LESION ASPIRATION MEDICATIONS: The patient is currently admitted to the hospital and receiving intravenous antibiotics. The antibiotics were administered within an appropriate time frame prior to the initiation of the procedure. ANESTHESIA/SEDATION: Moderate (conscious) sedation was employed during this procedure. A total of Versed 1.0 mg and Fentanyl 50 mcg was administered intravenously by the radiology nurse. Total intra-service moderate Sedation Time: 14 minutes. The patient's level of consciousness and vital signs were monitored continuously by radiology nursing throughout the procedure under my direct supervision. COMPLICATIONS: None immediate. PROCEDURE: Informed written consent was obtained from the patient after a thorough discussion of the procedural risks, benefits and alternatives. All questions were addressed. Maximal Sterile Barrier Technique was utilized including caps, mask, sterile gowns, sterile gloves, sterile drape, hand hygiene and skin antiseptic. A timeout was performed prior to the  initiation of the procedure. Previous imaging reviewed. Patient position prone. Noncontrast localization CT performed. The cystic renal lesion was localized and marked for a posterior approach. Under sterile conditions and local anesthesia, an 18 gauge 15 cm access needle was advanced percutaneously from a posterior approach under CT guidance. Needle position confirmed with CT. Syringe aspiration yielded 8 cc old dark blood. No purulent component. Needle removed. IMPRESSION: Successful CT-guided renal cystic lesion aspiration yielding dark old blood, most consistent with a hemorrhagic cyst. Sample sent for culture. Electronically Signed   By: Jerilynn Mages.  Shick M.D.   On: 08/14/2022 16:37   CT ABDOMEN W WO CONTRAST  Result Date: 08/12/2022 CLINICAL DATA:  Follow-up LEFT renal mass, indeterminate renal mass in a 61 year old female. EXAM: CT ABDOMEN WITHOUT AND WITH CONTRAST TECHNIQUE: Multidetector CT imaging of  the abdomen was performed following the standard protocol before and following the bolus administration of intravenous contrast. RADIATION DOSE REDUCTION: This exam was performed according to the departmental dose-optimization program which includes automated exposure control, adjustment of the mA and/or kV according to patient size and/or use of iterative reconstruction technique. CONTRAST:  3m OMNIPAQUE IOHEXOL 350 MG/ML SOLN COMPARISON:  August 09, 2022 FINDINGS: Lower chest: Incidental imaging of the lung bases with small effusions and basilar atelectasis. Slightly increased since previous imaging. Hepatobiliary: Lobular hepatic contours with fissural widening of hepatic fissures. No focal, suspicious hepatic lesion. Portal vein is patent. Post cholecystectomy without signs of biliary duct dilation. Pancreas: Mild atrophy of the pancreas without ductal dilation, inflammation or visible lesion. Spleen: Normal. Adrenals/Urinary Tract: Adrenal glands are normal. Low-attenuation lesion in the posterior lower-interpolar LEFT kidney measuring 2.0 x 1.8 cm previously 2.2 x 1.8 cm as measured on the MRI evaluation. On noncontrast imaging the area appears little changed compared to imaging from July 10, 2021. Some hypoattenuation surrounding the lesion also little changed compared to previous imaging. Small tract extending towards the capsule is similar without signs of perinephric fluid collection. Scattered subcentimeter hypodensities in the bilateral kidneys compatible with Bosniak category 2 lesions are similarly unchanged. Mild peripelvic stranding on the LEFT. Renal veins are patent. Stomach/Bowel: No acute gastrointestinal findings. Pelvic bowel loops are not imaged. Vascular/Lymphatic: Calcified and noncalcified atheromatous plaque of the abdominal aorta. There is no gastrohepatic or hepatoduodenal ligament lymphadenopathy. No retroperitoneal or mesenteric lymphadenopathy. Other: No ascites Musculoskeletal:  No acute or significant osseous findings. IMPRESSION: 1. Findings that are most suspicious for a superinfected hemorrhagic cyst of the lower pole the LEFT kidney. Renal neoplasm is felt less likely but would suggest follow-up in 6-8 weeks to ensure resolution. 2. Mild peripelvic stranding on the LEFT. Likely related underlying urinary tract infection. 3. Lobular hepatic contours with fissural widening of hepatic fissures. Correlate with any clinical or laboratory evidence of liver disease. 4. Post cholecystectomy without signs of biliary duct dilation. 5. Small bilateral pleural effusions and basilar atelectasis. Slightly increased since previous imaging. 6. Aortic atherosclerosis. Aortic Atherosclerosis (ICD10-I70.0). Electronically Signed   By: GZetta BillsM.D.   On: 08/12/2022 08:44   DG HIP UNILAT WITH PELVIS 2-3 VIEWS RIGHT  Result Date: 08/10/2022 CLINICAL DATA:  Pain EXAM: DG HIP (WITH OR WITHOUT PELVIS) 3V RIGHT COMPARISON:  None Available. FINDINGS: There is no evidence of hip fracture or dislocation. 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 There are lumbosacral degenerative changes. Pelvic ring is intact. No osteolytic or osteoblastic changes identified. IMPRESSION: Degenerative changes.  No acute osseous abnormalities identified. Electronically Signed   By: JSammie Bench  M.D.   On: 08/10/2022 09:39   MR ABDOMEN W WO CONTRAST  Result Date: 08/10/2022 CLINICAL DATA:  Left renal lesion in a patient with history of UTI. EXAM: MRI ABDOMEN WITHOUT AND WITH CONTRAST TECHNIQUE: Multiplanar multisequence MR imaging of the abdomen was performed both before and after the administration of intravenous contrast. CONTRAST:  7.73m GADAVIST GADOBUTROL 1 MMOL/ML IV SOLN COMPARISON:  CT stone study 08/09/2022 FINDINGS: Lower chest: Unremarkable. Hepatobiliary: Scattered tiny T2 hyperintense nonenhancing foci in the liver parenchyma are compatible with cysts. No followup imaging is  recommended. Gallbladder surgically absent. No intrahepatic or extrahepatic biliary dilation. Pancreas: No focal mass lesion. No dilatation of the main duct. No intraparenchymal cyst. No peripancreatic edema. Spleen:  No splenomegaly. No focal mass lesion. Adrenals/Urinary Tract: No adrenal nodule or mass. Tiny cortical cyst noted right kidney. No followup imaging is recommended. 1.7 x 2.2 x 1.8 cm lesion in the posterior interpolar left kidney shows heterogeneous signal intensity on T2 imaging. Signal intensity on precontrast T1 imaging is nearly isointense to background kidney with some peripheral T1 shortening. Imaging after IV contrast administration shows peripheral rim enhancement. The lesion demonstrates restricted diffusion and on postcontrast imaging is relatively well-defined. The lesion measured 3.2 x 2.9 cm on CT scan of 08/09/2022 and although subtle, was present on a CT scan of 08/06/2022 when it measured 1.5 x 1.5 cm. This lesion was visible on a CT scan from 03/29/2022 when it measured 1.4 x 1.2 cm. Other additional small cortical cysts are noted in the left kidney. There is no hydronephrosis. Stomach/Bowel: Stomach is unremarkable. No gastric wall thickening. No evidence of outlet obstruction. Duodenum is normally positioned as is the ligament of Treitz. No small bowel or colonic dilatation within the visualized abdomen. Vascular/Lymphatic: No abdominal aortic aneurysm. No abdominal lymphadenopathy Other:  No intraperitoneal free fluid. Musculoskeletal: No focal suspicious marrow enhancement within the visualized bony anatomy. IMPRESSION: 1. 1.7 x 2.2 x 1.8 cm lesion in the posterior interpolar left kidney shows heterogeneous signal intensity on T2 imaging with peripheral rim enhancement and restricted diffusion. This lesion has been present since at least 03/29/2022 with interval increase in size between 08/06/2022 and 08/09/2022. Heterogeneous signal intensity noted on today's study with  restricted diffusion. Given that this was present previously and has enlarged recently, superinfection of a previously existing cyst would be a consideration. Interval hemorrhage into a previous cyst would also be a consideration. Cystic neoplasm is considered unlikely based on prior imaging but is not entirely excluded. 2. Other small cortical cysts in both kidneys compatible with tiny simple cyst. No followup imaging is recommended. Electronically Signed   By: EMisty StanleyM.D.   On: 08/10/2022 08:40   CT Renal Stone Study  Result Date: 08/09/2022 CLINICAL DATA:  Flank pain and hip pain EXAM: CT ABDOMEN AND PELVIS WITHOUT CONTRAST TECHNIQUE: Multidetector CT imaging of the abdomen and pelvis was performed following the standard protocol without IV contrast. RADIATION DOSE REDUCTION: This exam was performed according to the departmental dose-optimization program which includes automated exposure control, adjustment of the mA and/or kV according to patient size and/or use of iterative reconstruction technique. COMPARISON:  CT abdomen and pelvis dated December 5th 2023 FINDINGS: Lower chest: Trace pericardial and pleural effusions. Small hiatal hernia. No acute abnormality. Hepatobiliary: No focal liver abnormality is seen. Status post cholecystectomy. No biliary dilatation. Pancreas: Unremarkable. No pancreatic ductal dilatation or surrounding inflammatory changes. Spleen: Normal in size without focal abnormality. Adrenals/Urinary Tract: Bilateral adrenal glands are unremarkable. No  hydronephrosis. Rim enhancing lesion of the lower pole of the left kidney measuring 3.1 x 2.9 cm on series 2, image 36. Not visible on prior exam due to lack of IV contrast. Bladder is unremarkable. Stomach/Bowel: Stomach is within normal limits. Appendix appears normal. No evidence of bowel wall thickening, distention, or inflammatory changes. Vascular/Lymphatic: Aortic atherosclerosis. No enlarged abdominal or pelvic lymph nodes.  Reproductive: Uterus and bilateral adnexa are unremarkable. Other: No abdominal wall hernia or abnormality. No abdominopelvic ascites. Musculoskeletal: No acute or significant osseous findings. IMPRESSION: 1. Lesion of the lower pole of the left kidney measuring up to 3.1 cm with rim enhancement secondary to excreted contrast from prior PE study. Favored to be renal abscess, although cystic renal mass is also a concern. Recommend contrast-enhanced abdominal MRI for better evaluation. 2. Trace pericardial and pleural effusions. 3. Aortic Atherosclerosis (ICD10-I70.0). Electronically Signed   By: Yetta Glassman M.D.   On: 08/09/2022 15:41   CT Angio Chest PE W and/or Wo Contrast  Result Date: 08/09/2022 CLINICAL DATA:  Shortness of breath and hypoxia. EXAM: CT ANGIOGRAPHY CHEST WITH CONTRAST TECHNIQUE: Multidetector CT imaging of the chest was performed using the standard protocol during bolus administration of intravenous contrast. Multiplanar CT image reconstructions and MIPs were obtained to evaluate the vascular anatomy. RADIATION DOSE REDUCTION: This exam was performed according to the departmental dose-optimization program which includes automated exposure control, adjustment of the mA and/or kV according to patient size and/or use of iterative reconstruction technique. CONTRAST:  168m OMNIPAQUE IOHEXOL 350 MG/ML SOLN COMPARISON:  09/14/2003 chest CT. FINDINGS: Cardiovascular: Cardiomegaly without substantial pericardial effusion. Coronary artery calcification is evident. Mild atherosclerotic calcification is noted in the wall of the thoracic aorta. There is no filling defect within the opacified pulmonary arteries to suggest the presence of an acute pulmonary embolus. Mediastinum/Nodes: No mediastinal lymphadenopathy. There is no hilar lymphadenopathy. The esophagus has normal imaging features. There is no axillary lymphadenopathy. Lungs/Pleura: No focal airspace consolidation. Scattered areas of mosaic  attenuation in the lung parenchyma is nonspecific but may be related to air trapping/small airways disease. Subtle interlobular septal thickening in the upper lungs evident. No overt airspace pulmonary edema tiny bilateral pleural effusions evident. Upper Abdomen: Liver is enlarged. The liver shows diffusely decreased attenuation suggesting fat deposition. Musculoskeletal: No worrisome lytic or sclerotic osseous abnormality. Review of the MIP images confirms the above findings. IMPRESSION: 1. No CT evidence for acute pulmonary embolus. 2. Scattered areas of mosaic attenuation in the lung parenchyma is nonspecific but may be related to air trapping/small airways disease. Component of mild pulmonary edema not excluded. 3. Tiny bilateral pleural effusions. 4. Hepatomegaly with hepatic steatosis. 5.  Aortic Atherosclerosis (ICD10-I70.0). Electronically Signed   By: EMisty StanleyM.D.   On: 08/09/2022 07:13   DG Chest Port 1 View  Result Date: 08/09/2022 CLINICAL DATA:  61year old female with flank pain, UTI, vomiting, tachycardia, hypoxia. EXAM: PORTABLE CHEST 1 VIEW COMPARISON:  Chest radiographs 11/05/2020 and earlier. FINDINGS: Portable AP view at 0534 hours. Mildly lower lung volumes. Normal cardiac size and mediastinal contours. Visualized tracheal air column is within normal limits. Allowing for portable technique the lungs are clear. Paucity of bowel gas in the visible abdomen. Negative visible osseous structures. IMPRESSION: Negative portable chest. Electronically Signed   By: HGenevie AnnM.D.   On: 08/09/2022 05:51   CT RENAL STONE STUDY  Result Date: 08/06/2022 CLINICAL DATA:  Right back pain EXAM: CT ABDOMEN AND PELVIS WITHOUT CONTRAST TECHNIQUE: Multidetector CT imaging of the  abdomen and pelvis was performed following the standard protocol without IV contrast. RADIATION DOSE REDUCTION: This exam was performed according to the departmental dose-optimization program which includes automated exposure  control, adjustment of the mA and/or kV according to patient size and/or use of iterative reconstruction technique. COMPARISON:  CT abdomen and pelvis dated March 29, 2022 FINDINGS: Lower chest: Trace pericardial effusion.  No acute abnormality. Hepatobiliary: No focal liver abnormality is seen. Status post cholecystectomy. No biliary dilatation. Pancreas: Unremarkable. No pancreatic ductal dilatation or surrounding inflammatory changes. Spleen: Normal in size without focal abnormality. Adrenals/Urinary Tract: Bilateral adrenal glands are unremarkable. No hydronephrosis. Left-greater-than-right punctate nonobstructing stones. Low-attenuation lesion of the mid region of the left kidney, likely simple cysts with no specific follow-up imaging recommended. Bladder is unremarkable. Stomach/Bowel: Stomach is within normal limits. Diverticulosis. Normal appendix. No evidence of bowel wall thickening, distention, or inflammatory changes. Vascular/Lymphatic: Aortic atherosclerosis. No enlarged abdominal or pelvic lymph nodes. Reproductive: Uterus and bilateral adnexa are unremarkable. Other: Small fat containing left inguinal hernia. No abdominopelvic ascites Musculoskeletal: No acute or significant osseous findings. IMPRESSION: 1. No acute findings in the abdomen or pelvis. 2. Left-greater-than-right punctate nonobstructing renal stones. 3. Aortic Atherosclerosis (ICD10-I70.0). Electronically Signed   By: Yetta Glassman M.D.   On: 08/06/2022 08:24    Microbiology: Results for orders placed or performed during the hospital encounter of 08/09/22  Blood culture (routine x 2)     Status: None   Collection Time: 08/09/22  5:06 AM   Specimen: BLOOD  Result Value Ref Range Status   Specimen Description   Final    BLOOD RIGHT ANTECUBITAL Performed at [facility], [address] [place], [place] [address]    Special Requests   Final    BOTTLES DRAWN AEROBIC AND ANAEROBIC Blood Culture adequate  volume Performed at [facility], [address] [place], [place] [address]    Culture   Final    NO GROWTH 5 DAYS Performed at [facility], [place] [address] [place], [place] [address]    Report Status 08/14/2022 FINAL  Final  Resp Panel by RT-PCR (Flu A&B, Covid) Anterior Nasal Swab     Status: Abnormal   Collection Time: 08/09/22  5:48 AM   Specimen: Anterior Nasal Swab  Result Value Ref Range Status   SARS Coronavirus 2 by RT PCR POSITIVE (A) NEGATIVE Final    Comment: (NOTE) SARS-CoV-2 target nucleic acids are DETECTED.  The SARS-CoV-2 RNA is generally detectable in upper respiratory specimens during the acute phase of infection. Positive results are indicative of the presence of the identified virus, but do not rule out bacterial infection or co-infection with other pathogens not detected by the test. Clinical correlation with patient history and other diagnostic information is necessary to determine patient infection status. The expected result is Negative.  Fact Sheet for Patients: EntrepreneurPulse.com.au  Fact Sheet for Healthcare Providers: IncredibleEmployment.be  This test is not yet approved or cleared by the [place] FDA and  has been authorized for detection and/or diagnosis of SARS-CoV-2 by FDA under an Emergency Use Authorization (EUA).  This EUA will remain in effect (meaning this test can be used) for the duration of  the COVID-19 declaration under Section 564(b)(1) of the A ct, 21 U.S.C. section 360bbb-3(b)(1), unless the authorization is terminated or revoked sooner.     Influenza A by PCR NEGATIVE NEGATIVE Final   Influenza B by PCR NEGATIVE NEGATIVE Final    Comment: (NOTE) The Xpert Xpress SARS-CoV-2/FLU/RSV  plus assay is intended as an aid in the diagnosis of influenza from Nasopharyngeal swab specimens and should not be used as a sole basis for treatment. Nasal washings  and aspirates are unacceptable for Xpert Xpress SARS-CoV-2/FLU/RSV testing.  Fact Sheet for Patients: EntrepreneurPulse.com.au  Fact Sheet for Healthcare Providers: IncredibleEmployment.be  This test is not yet approved or cleared by the [place] FDA and has been authorized for detection and/or diagnosis of SARS-CoV-2 by FDA under an Emergency Use Authorization (EUA). This EUA will remain in effect (meaning this test can be used) for the duration of the COVID-19 declaration under Section 564(b)(1) of the Act, 21 U.S.C. section 360bbb-3(b)(1), unless the authorization is terminated or revoked.  Performed at [facility], [address] [place], [place] [address]   Blood culture (routine x 2)     Status: None   Collection Time: 08/09/22  6:01 AM   Specimen: BLOOD  Result Value Ref Range Status   Specimen Description   Final    BLOOD RIGHT HAND Performed at [facility], [address] [place], [place] [address]    Special Requests   Final    BOTTLES DRAWN AEROBIC AND ANAEROBIC Blood Culture adequate volume Performed at [facility], [address], [place], [place] [address]    Culture   Final    NO GROWTH 5 DAYS Performed at [facility], [place] [address], [place], [place] [address]    Report Status 08/14/2022 FINAL  Final  Aerobic/Anaerobic Culture w Gram Stain (surgical/deep wound)     Status: None (Preliminary result)   Collection Time: 08/14/22  1:07 PM   Specimen: Abscess  Result Value Ref Range Status   Specimen Description ABSCESS  Final   Special Requests NONE  Final   Gram Stain   Final    ABUNDANT WBC PRESENT, PREDOMINANTLY PMN NO ORGANISMS SEEN    Culture   Final    ABUNDANT KLEBSIELLA PNEUMONIAE SUSCEPTIBILITIES TO FOLLOW Performed at [facility], [place] [address], [place], [place] [address]    Report Status PENDING  Incomplete     Labs: CBC: Recent Labs  Lab 08/10/22 0423 08/11/22 0342 08/12/22 0434 08/13/22 0218 08/14/22 0309 08/15/22 0252  WBC 6.7 9.5 11.8* 13.1* 20.5* 18.1*  NEUTROABS 5.5 8.4* 8.6* 9.5* 12.3*  --   HGB 11.1* 11.0* 11.7* 12.8 13.3 12.7  HCT 34.4* 33.0* 36.2 37.2 40.4 39.5  MCV 85.4 83.1 85.2 82.7 83.6 86.1  PLT 90* 98* 111* 141* 211 595   Basic Metabolic Panel: Recent Labs  Lab 08/10/22 0423 08/11/22 0342 08/12/22 0434 08/13/22 0218 08/14/22 0309  NA 135 134* 135 137 136  K 3.9 4.2 3.8 3.7 3.5  CL 100 100 100 101 99  CO2 20* '22 25 27 28  '$ GLUCOSE 270* 299* 306* 177* 102*  BUN '16 21 16 13 20  '$ CREATININE 0.57 0.50 0.45 0.41* 0.45  CALCIUM 9.6 9.2 9.1 9.2 8.8*   Liver Function Tests: Recent Labs  Lab 08/10/22 0423 08/11/22 0342 08/12/22 0434 08/13/22 0218 08/14/22 0309  AST 23 14* 14* 13* 15  ALT '30 22 21 17 18  '$ ALKPHOS 110 82 75 74 63  BILITOT 1.0 0.8 0.9 0.5 0.3  PROT 6.8 5.8* 5.9* 6.1* 5.4*  ALBUMIN 3.0* 2.4* 2.6* 2.7* 2.6*   CBG: Recent Labs  Lab 08/14/22 0851 08/14/22 1133 08/14/22 1616 08/14/22 2221 08/15/22 0754  GLUCAP 85 89 75 116* 90    Discharge time spent: approximately 35  minutes spent on discharge counseling, evaluation of patient on day of discharge, and coordination of discharge planning with nursing, social work, pharmacy and case management  Signed: Edwin Dada, MD [facility] 08/15/2022

## 2022-08-16 ENCOUNTER — Encounter: Payer: BC Managed Care – PPO | Admitting: Dietician

## 2022-08-19 LAB — AEROBIC/ANAEROBIC CULTURE W GRAM STAIN (SURGICAL/DEEP WOUND)

## 2022-09-10 ENCOUNTER — Ambulatory Visit: Payer: Commercial Managed Care - PPO | Admitting: Podiatry

## 2022-09-10 ENCOUNTER — Ambulatory Visit (INDEPENDENT_AMBULATORY_CARE_PROVIDER_SITE_OTHER): Payer: Commercial Managed Care - PPO

## 2022-09-10 VITALS — BP 118/68

## 2022-09-10 DIAGNOSIS — E119 Type 2 diabetes mellitus without complications: Secondary | ICD-10-CM | POA: Diagnosis not present

## 2022-09-10 DIAGNOSIS — Z794 Long term (current) use of insulin: Secondary | ICD-10-CM

## 2022-09-10 DIAGNOSIS — L97512 Non-pressure chronic ulcer of other part of right foot with fat layer exposed: Secondary | ICD-10-CM

## 2022-09-10 NOTE — Progress Notes (Signed)
Subjective:  Patient ID: Courtney Parks, female    DOB: 1960/09/21,  MRN: [id number]  Chief Complaint  Patient presents with   Foot Ulcer    62 y.o. female presents with the above complaint.  Patient presents with complaint of right forefoot submetatarsal wound that has regressed a little bit.  Patient states that there is still a hole that is deep.  No purulent drainage.  No redness noted.  She wanted to get it evaluated overall has improved considerably from where we started.   Review of Systems: Negative except as noted in the HPI. Denies N/V/F/Ch.  Past Medical History:  Diagnosis Date   Anxiety    Depression    Diabetes mellitus, type II ([place])    Hyperlipidemia    Hypertension    Kidney stones     Current Outpatient Medications:    acetaminophen (TYLENOL) 500 MG tablet, Take 500 mg by mouth every 6 (six) hours as needed for moderate pain., Disp: , Rfl:    aspirin EC 81 MG tablet, Take 81 mg by mouth daily. Swallow whole., Disp: , Rfl:    ciprofloxacin (CIPRO) 250 MG tablet, Take 1 tablet (250 mg total) by mouth 2 (two) times daily., Disp: 84 tablet, Rfl: 0   Continuous Blood Gluc Sensor (FREESTYLE LIBRE 2 SENSOR) MISC, 1 each by Does not apply route PRO., Disp: 1 each, Rfl: 4   ibuprofen (ADVIL) 200 MG tablet, Take 200-800 mg by mouth every 6 (six) hours as needed for moderate pain., Disp: , Rfl:    insulin glargine, 1 Unit Dial, (TOUJEO SOLOSTAR) 300 UNIT/ML Solostar Pen, Inject 42 Units into the skin daily in the afternoon. (Patient taking differently: Inject 64 Units into the skin daily in the afternoon.), Disp: 1.5 mL, Rfl: 1   Insulin Pen Needle (PEN NEEDLES 3/16") 31G X 5 MM MISC, 100 each by Does not apply route 4 (four) times daily., Disp: 100 each, Rfl: 3   Magnesium 100 MG CAPS, Take 100 mg by mouth daily., Disp: , Rfl:    ondansetron (ZOFRAN-ODT) 4 MG disintegrating tablet, Take 1 tablet (4 mg total) by mouth every 8 (eight) hours as needed for nausea or  vomiting., Disp: 20 tablet, Rfl: 0   phenazopyridine (PYRIDIUM) 200 MG tablet, Take 1 tablet (200 mg total) by mouth 3 (three) times daily., Disp: 6 tablet, Rfl: 0   Probiotic Product (PROBIOTIC BLEND PO), Take 1 tablet by mouth daily., Disp: , Rfl:    TRULICITY 3 OJ/5.0KX SOPN, Inject 3 mg into the skin once a week., Disp: , Rfl:    Turmeric 500 MG CAPS, Take 500 mg by mouth daily., Disp: , Rfl:    vitamin C (ASCORBIC ACID) 250 MG tablet, Take 250 mg by mouth daily., Disp: , Rfl:    zinc gluconate 50 MG tablet, Take 50 mg by mouth daily., Disp: , Rfl:   Social History   Tobacco Use  Smoking Status Former   Years: 5.00   Types: Cigarettes   Quit date: 12/12/1983   Years since quitting: 38.7  Smokeless Tobacco Never    Allergies  Allergen Reactions   Codeine     REACTION: Nausea   Demerol Nausea And Vomiting   Other Other (See Comments)    Narcotics- drops her blood pressure   Shellfish Allergy Swelling   Objective:   Vitals:   09/10/22 0951  BP: 118/68   There is no height or weight on file to calculate BMI. Constitutional Well developed. Well nourished.  Vascular Dorsalis pedis pulses palpable bilaterally. Posterior tibial pulses palpable bilaterally. Capillary refill normal to all digits.  No cyanosis or clubbing noted. Pedal hair growth normal.  Neurologic Normal speech. Oriented to person, place, and time. Epicritic sensation to light touch grossly present bilaterally.  Dermatologic Right forefoot submetatarsal wound with fat layer exposed probing down to deep tissue but not bone no purulent drainage noted.  No malodor present adequate bleeding noted.  Orthopedic: Normal joint ROM without pain or crepitus bilaterally. No visible deformities. No bony tenderness.   Radiographs: 3 views of skeletally mature the right foot: No signs of osteomyelitis noted.  No cortical irregularity or breakdown noted. Assessment:   1. Ulcerated, foot, right, with fat layer exposed  ([place])   2. Type 2 diabetes mellitus without complication, with long-term current use of insulin ([place])    Plan:  Patient was evaluated and treated and all questions answered.  Right forefoot submetatarsal ulceration with fat layer exposed -All questions and concerns were discussed with the patient today. -Ultimately the plantar forefoot submetatarsal wound was debrided down minimally.  Adequate bleeding noted.  No signs of infection noted. -We will continue Betadine wet-to-dry dry dressing change. -Darco wedge shoe was dispensed to take the pressure off the forefoot.  No follow-ups on file.   Right submet forefoot wound probes down not to bone.  Rest is clinically healed X-ray today Darco wedge shoe

## 2022-09-17 ENCOUNTER — Other Ambulatory Visit: Payer: Self-pay | Admitting: Urology

## 2022-09-17 DIAGNOSIS — D49512 Neoplasm of unspecified behavior of left kidney: Secondary | ICD-10-CM

## 2022-10-09 ENCOUNTER — Ambulatory Visit (INDEPENDENT_AMBULATORY_CARE_PROVIDER_SITE_OTHER): Payer: Commercial Managed Care - PPO | Admitting: Podiatry

## 2022-10-09 DIAGNOSIS — L97512 Non-pressure chronic ulcer of other part of right foot with fat layer exposed: Secondary | ICD-10-CM

## 2022-10-09 NOTE — Progress Notes (Signed)
Subjective:  Patient ID: Courtney Parks, female    DOB: February 27, 1961,  MRN: [id number]  Chief Complaint  Patient presents with   Foot Ulcer    Right foot ulcer     62 y.o. female presents with the above complaint patient presents with follow-up of right forefoot submetatarsal wound.  She states doing a lot better has completely closed just a little area left to open.   Review of Systems: Negative except as noted in the HPI. Denies N/V/F/Ch.  Past Medical History:  Diagnosis Date   Anxiety    Depression    Diabetes mellitus, type II ([place])    Hyperlipidemia    Hypertension    Kidney stones     Current Outpatient Medications:    acetaminophen (TYLENOL) 500 MG tablet, Take 500 mg by mouth every 6 (six) hours as needed for moderate pain., Disp: , Rfl:    aspirin EC 81 MG tablet, Take 81 mg by mouth daily. Swallow whole., Disp: , Rfl:    Continuous Blood Gluc Sensor (FREESTYLE LIBRE 2 SENSOR) MISC, 1 each by Does not apply route PRO., Disp: 1 each, Rfl: 4   ibuprofen (ADVIL) 200 MG tablet, Take 200-800 mg by mouth every 6 (six) hours as needed for moderate pain., Disp: , Rfl:    insulin glargine, 1 Unit Dial, (TOUJEO SOLOSTAR) 300 UNIT/ML Solostar Pen, Inject 42 Units into the skin daily in the afternoon. (Patient taking differently: Inject 64 Units into the skin daily in the afternoon.), Disp: 1.5 mL, Rfl: 1   Insulin Pen Needle (PEN NEEDLES 3/16") 31G X 5 MM MISC, 100 each by Does not apply route 4 (four) times daily., Disp: 100 each, Rfl: 3   Magnesium 100 MG CAPS, Take 100 mg by mouth daily., Disp: , Rfl:    ondansetron (ZOFRAN-ODT) 4 MG disintegrating tablet, Take 1 tablet (4 mg total) by mouth every 8 (eight) hours as needed for nausea or vomiting., Disp: 20 tablet, Rfl: 0   phenazopyridine (PYRIDIUM) 200 MG tablet, Take 1 tablet (200 mg total) by mouth 3 (three) times daily., Disp: 6 tablet, Rfl: 0   Probiotic Product (PROBIOTIC BLEND PO), Take 1 tablet by mouth daily.,  Disp: , Rfl:    TRULICITY 3 PN/3.6RW SOPN, Inject 3 mg into the skin once a week., Disp: , Rfl:    Turmeric 500 MG CAPS, Take 500 mg by mouth daily., Disp: , Rfl:    vitamin C (ASCORBIC ACID) 250 MG tablet, Take 250 mg by mouth daily., Disp: , Rfl:    zinc gluconate 50 MG tablet, Take 50 mg by mouth daily., Disp: , Rfl:   Social History   Tobacco Use  Smoking Status Former   Years: 5.00   Types: Cigarettes   Quit date: 12/12/1983   Years since quitting: 38.8  Smokeless Tobacco Never    Allergies  Allergen Reactions   Codeine     REACTION: Nausea   Demerol Nausea And Vomiting   Other Other (See Comments)    Narcotics- drops her blood pressure   Shellfish Allergy Swelling   Objective:   There were no vitals filed for this visit.  There is no height or weight on file to calculate BMI. Constitutional Well developed. Well nourished.  Vascular Dorsalis pedis pulses palpable bilaterally. Posterior tibial pulses palpable bilaterally. Capillary refill normal to all digits.  No cyanosis or clubbing noted. Pedal hair growth normal.  Neurologic Normal speech. Oriented to person, place, and time. Epicritic sensation to light touch grossly  present bilaterally.  Dermatologic Right forefoot submetatarsal wound now limited to the breakdown of the skin probing.  No longer probing down to deep tissue.  Very superficial but not bone no purulent drainage noted.  No malodor present adequate bleeding noted.  Orthopedic: Normal joint ROM without pain or crepitus bilaterally. No visible deformities. No bony tenderness.   Radiographs: 3 views of skeletally mature the right foot: No signs of osteomyelitis noted.  No cortical irregularity or breakdown noted. Assessment:   No diagnosis found.  Plan:  Patient was evaluated and treated and all questions answered.  Right forefoot submetatarsal ulceration now limited to the breakdown of the skin -All questions and concerns were discussed with  the patient today. -Ultimately the plantar forefoot submetatarsal wound was debrided down minimally.  Adequate bleeding noted.  No signs of infection noted. -Mostly completely reepithelialized at this point we will do triple antibiotic and a Band-Aid to the plantar superficial wound. -Darco wedge shoe was dispensed to take the pressure off the forefoot.  No follow-ups on file.

## 2022-10-21 ENCOUNTER — Encounter: Payer: Self-pay | Admitting: Podiatry

## 2022-10-21 ENCOUNTER — Other Ambulatory Visit: Payer: Self-pay | Admitting: Podiatry

## 2022-10-21 MED ORDER — GABAPENTIN 300 MG PO CAPS: 300.0000 mg | ORAL_CAPSULE | Freq: Three times a day (TID) | ORAL | 3 refills | Status: AC

## 2022-10-25 ENCOUNTER — Ambulatory Visit
Admission: RE | Admit: 2022-10-25 | Discharge: 2022-10-25 | Disposition: A | Discharge status: Home / Self Care | Payer: Commercial Managed Care - PPO | Source: Ambulatory Visit | Attending: Urology | Admitting: Urology

## 2022-10-25 DIAGNOSIS — D49512 Neoplasm of unspecified behavior of left kidney: Secondary | ICD-10-CM

## 2022-10-25 DIAGNOSIS — C642 Malignant neoplasm of left kidney, except renal pelvis: Secondary | ICD-10-CM | POA: Diagnosis not present

## 2022-10-25 MED ORDER — GADOPICLENOL 0.5 MMOL/ML IV SOLN
10.0000 mL | Freq: Once | INTRAVENOUS | Status: AC | PRN
  Administered 2022-10-25: 9 mL via INTRAVENOUS

## 2022-10-30 ENCOUNTER — Other Ambulatory Visit (HOSPITAL_BASED_OUTPATIENT_CLINIC_OR_DEPARTMENT_OTHER): Payer: Self-pay

## 2022-10-30 DIAGNOSIS — N281 Cyst of kidney, acquired: Secondary | ICD-10-CM | POA: Diagnosis not present

## 2022-10-30 MED ORDER — TRULICITY 3 MG/0.5ML ~~LOC~~ SOAJ
3.0000 mg | SUBCUTANEOUS | 11 refills | Status: AC
  Filled 2022-10-30: qty 2, 28d supply, fill #0
  Filled 2022-11-27: qty 2, 28d supply, fill #1
  Filled 2022-12-28 – 2023-01-08 (×3): qty 2, 28d supply, fill #2
  Filled 2023-02-06: qty 2, 28d supply, fill #3
  Filled 2023-04-03: qty 2, 28d supply, fill #4

## 2022-11-01 DIAGNOSIS — R300 Dysuria: Secondary | ICD-10-CM | POA: Diagnosis not present

## 2022-11-01 DIAGNOSIS — N3000 Acute cystitis without hematuria: Secondary | ICD-10-CM | POA: Diagnosis not present

## 2022-11-01 DIAGNOSIS — R350 Frequency of micturition: Secondary | ICD-10-CM | POA: Diagnosis not present

## 2022-11-06 ENCOUNTER — Ambulatory Visit: Payer: Commercial Managed Care - PPO | Admitting: Podiatry

## 2022-11-21 ENCOUNTER — Other Ambulatory Visit (HOSPITAL_BASED_OUTPATIENT_CLINIC_OR_DEPARTMENT_OTHER): Payer: Self-pay

## 2022-11-21 ENCOUNTER — Ambulatory Visit (INDEPENDENT_AMBULATORY_CARE_PROVIDER_SITE_OTHER): Payer: Commercial Managed Care - PPO

## 2022-11-21 ENCOUNTER — Ambulatory Visit (INDEPENDENT_AMBULATORY_CARE_PROVIDER_SITE_OTHER): Payer: Commercial Managed Care - PPO | Admitting: Podiatry

## 2022-11-21 DIAGNOSIS — Z794 Long term (current) use of insulin: Secondary | ICD-10-CM | POA: Diagnosis not present

## 2022-11-21 DIAGNOSIS — L97512 Non-pressure chronic ulcer of other part of right foot with fat layer exposed: Secondary | ICD-10-CM | POA: Diagnosis not present

## 2022-11-21 DIAGNOSIS — E119 Type 2 diabetes mellitus without complications: Secondary | ICD-10-CM

## 2022-11-21 DIAGNOSIS — R112 Nausea with vomiting, unspecified: Secondary | ICD-10-CM

## 2022-11-21 MED ORDER — INSULIN GLARGINE (1 UNIT DIAL) 300 UNIT/ML ~~LOC~~ SOPN
68.0000 [IU] | PEN_INJECTOR | Freq: Every day | SUBCUTANEOUS | 99 refills | Status: DC
  Filled 2022-11-21: qty 6, 25d supply, fill #0
  Filled 2022-12-22: qty 6, 25d supply, fill #1
  Filled 2023-01-22: qty 6, 25d supply, fill #2
  Filled 2023-02-13: qty 6, 25d supply, fill #3
  Filled 2023-03-19: qty 6, 25d supply, fill #4
  Filled 2023-04-17: qty 6, 25d supply, fill #5
  Filled 2023-05-02: qty 6, 25d supply, fill #6
  Filled 2023-05-14: qty 4.5, 19d supply, fill #6
  Filled 2023-05-15: qty 1.5, 6d supply, fill #6
  Filled 2023-06-08: qty 6, 25d supply, fill #7
  Filled 2023-07-03: qty 3, 13d supply, fill #8
  Filled 2023-07-04: qty 3, 12d supply, fill #8
  Filled 2023-07-30: qty 6, 25d supply, fill #9
  Filled 2023-09-01: qty 6, 25d supply, fill #10
  Filled 2023-10-05: qty 6, 25d supply, fill #11

## 2022-11-21 NOTE — Progress Notes (Signed)
Subjective:  Patient ID: Courtney Parks, female    DOB: February 27, 1961,  MRN: [id number]  Chief Complaint  Patient presents with   Foot Ulcer    Right foot ulcer     62 y.o. female presents with the above complaint patient presents with follow-up of right forefoot submetatarsal wound.  She states doing a lot better has completely closed just a little area left to open.   Review of Systems: Negative except as noted in the HPI. Denies N/V/F/Ch.  Past Medical History:  Diagnosis Date   Anxiety    Depression    Diabetes mellitus, type II ([place])    Hyperlipidemia    Hypertension    Kidney stones     Current Outpatient Medications:    acetaminophen (TYLENOL) 500 MG tablet, Take 500 mg by mouth every 6 (six) hours as needed for moderate pain., Disp: , Rfl:    aspirin EC 81 MG tablet, Take 81 mg by mouth daily. Swallow whole., Disp: , Rfl:    Continuous Blood Gluc Sensor (FREESTYLE LIBRE 2 SENSOR) MISC, 1 each by Does not apply route PRO., Disp: 1 each, Rfl: 4   ibuprofen (ADVIL) 200 MG tablet, Take 200-800 mg by mouth every 6 (six) hours as needed for moderate pain., Disp: , Rfl:    insulin glargine, 1 Unit Dial, (TOUJEO SOLOSTAR) 300 UNIT/ML Solostar Pen, Inject 42 Units into the skin daily in the afternoon. (Patient taking differently: Inject 64 Units into the skin daily in the afternoon.), Disp: 1.5 mL, Rfl: 1   Insulin Pen Needle (PEN NEEDLES 3/16") 31G X 5 MM MISC, 100 each by Does not apply route 4 (four) times daily., Disp: 100 each, Rfl: 3   Magnesium 100 MG CAPS, Take 100 mg by mouth daily., Disp: , Rfl:    ondansetron (ZOFRAN-ODT) 4 MG disintegrating tablet, Take 1 tablet (4 mg total) by mouth every 8 (eight) hours as needed for nausea or vomiting., Disp: 20 tablet, Rfl: 0   phenazopyridine (PYRIDIUM) 200 MG tablet, Take 1 tablet (200 mg total) by mouth 3 (three) times daily., Disp: 6 tablet, Rfl: 0   Probiotic Product (PROBIOTIC BLEND PO), Take 1 tablet by mouth daily.,  Disp: , Rfl:    TRULICITY 3 PN/3.6RW SOPN, Inject 3 mg into the skin once a week., Disp: , Rfl:    Turmeric 500 MG CAPS, Take 500 mg by mouth daily., Disp: , Rfl:    vitamin C (ASCORBIC ACID) 250 MG tablet, Take 250 mg by mouth daily., Disp: , Rfl:    zinc gluconate 50 MG tablet, Take 50 mg by mouth daily., Disp: , Rfl:   Social History   Tobacco Use  Smoking Status Former   Years: 5.00   Types: Cigarettes   Quit date: 12/12/1983   Years since quitting: 38.8  Smokeless Tobacco Never    Allergies  Allergen Reactions   Codeine     REACTION: Nausea   Demerol Nausea And Vomiting   Other Other (See Comments)    Narcotics- drops her blood pressure   Shellfish Allergy Swelling   Objective:   There were no vitals filed for this visit.  There is no height or weight on file to calculate BMI. Constitutional Well developed. Well nourished.  Vascular Dorsalis pedis pulses palpable bilaterally. Posterior tibial pulses palpable bilaterally. Capillary refill normal to all digits.  No cyanosis or clubbing noted. Pedal hair growth normal.  Neurologic Normal speech. Oriented to person, place, and time. Epicritic sensation to light touch grossly  present bilaterally.  Dermatologic Right forefoot submetatarsal wound now limited to the breakdown of the skin probing.  No longer probing down to deep tissue.  Very superficial but not bone no purulent drainage noted.  No malodor present adequate bleeding noted.  Orthopedic: Normal joint ROM without pain or crepitus bilaterally. No visible deformities. No bony tenderness.   Radiographs: 3 views of skeletally mature the right foot: No signs of osteomyelitis noted.  No cortical irregularity or breakdown noted. Assessment:   No diagnosis found.  Plan:  Patient was evaluated and treated and all questions answered.  Right forefoot submetatarsal ulceration now limited to the breakdown of the skin -All questions and concerns were discussed with  the patient today. -Ultimately the plantar forefoot submetatarsal wound was debrided down minimally.  Adequate bleeding noted.  No signs of infection noted. -Mostly completely reepithelialized at this point we will do triple antibiotic and a Band-Aid to the plantar superficial wound. -Physical therpy script was given  -Darco wedge shoe was dispensed to take the pressure off the forefoot.  No follow-ups on file.

## 2022-11-22 ENCOUNTER — Other Ambulatory Visit (HOSPITAL_BASED_OUTPATIENT_CLINIC_OR_DEPARTMENT_OTHER): Payer: Self-pay

## 2022-11-25 ENCOUNTER — Other Ambulatory Visit: Payer: Self-pay | Admitting: Podiatry

## 2022-11-25 DIAGNOSIS — Z794 Long term (current) use of insulin: Secondary | ICD-10-CM

## 2022-11-25 DIAGNOSIS — L97512 Non-pressure chronic ulcer of other part of right foot with fat layer exposed: Secondary | ICD-10-CM

## 2022-11-27 ENCOUNTER — Other Ambulatory Visit (HOSPITAL_BASED_OUTPATIENT_CLINIC_OR_DEPARTMENT_OTHER): Payer: Self-pay

## 2022-11-28 ENCOUNTER — Other Ambulatory Visit (HOSPITAL_BASED_OUTPATIENT_CLINIC_OR_DEPARTMENT_OTHER): Payer: Self-pay

## 2022-11-28 DIAGNOSIS — R262 Difficulty in walking, not elsewhere classified: Secondary | ICD-10-CM | POA: Diagnosis not present

## 2022-11-28 DIAGNOSIS — M62571 Muscle wasting and atrophy, not elsewhere classified, right ankle and foot: Secondary | ICD-10-CM | POA: Diagnosis not present

## 2022-11-28 DIAGNOSIS — M25671 Stiffness of right ankle, not elsewhere classified: Secondary | ICD-10-CM | POA: Diagnosis not present

## 2022-12-06 DIAGNOSIS — R262 Difficulty in walking, not elsewhere classified: Secondary | ICD-10-CM | POA: Diagnosis not present

## 2022-12-06 DIAGNOSIS — M25671 Stiffness of right ankle, not elsewhere classified: Secondary | ICD-10-CM | POA: Diagnosis not present

## 2022-12-06 DIAGNOSIS — M62571 Muscle wasting and atrophy, not elsewhere classified, right ankle and foot: Secondary | ICD-10-CM | POA: Diagnosis not present

## 2022-12-11 ENCOUNTER — Encounter: Payer: Self-pay | Admitting: Podiatry

## 2022-12-11 DIAGNOSIS — R262 Difficulty in walking, not elsewhere classified: Secondary | ICD-10-CM | POA: Diagnosis not present

## 2022-12-11 DIAGNOSIS — M25671 Stiffness of right ankle, not elsewhere classified: Secondary | ICD-10-CM | POA: Diagnosis not present

## 2022-12-11 DIAGNOSIS — M62571 Muscle wasting and atrophy, not elsewhere classified, right ankle and foot: Secondary | ICD-10-CM | POA: Diagnosis not present

## 2022-12-16 ENCOUNTER — Other Ambulatory Visit (HOSPITAL_BASED_OUTPATIENT_CLINIC_OR_DEPARTMENT_OTHER): Payer: Self-pay

## 2022-12-19 ENCOUNTER — Ambulatory Visit: Payer: Commercial Managed Care - PPO | Admitting: Podiatry

## 2022-12-22 ENCOUNTER — Other Ambulatory Visit (HOSPITAL_BASED_OUTPATIENT_CLINIC_OR_DEPARTMENT_OTHER): Payer: Self-pay

## 2022-12-26 ENCOUNTER — Ambulatory Visit: Payer: Commercial Managed Care - PPO | Admitting: Podiatry

## 2022-12-26 ENCOUNTER — Encounter: Payer: Self-pay | Admitting: Podiatry

## 2022-12-26 DIAGNOSIS — Z794 Long term (current) use of insulin: Secondary | ICD-10-CM | POA: Diagnosis not present

## 2022-12-26 DIAGNOSIS — I999 Unspecified disorder of circulatory system: Secondary | ICD-10-CM

## 2022-12-26 DIAGNOSIS — S86012A Strain of left Achilles tendon, initial encounter: Secondary | ICD-10-CM

## 2022-12-26 DIAGNOSIS — M25671 Stiffness of right ankle, not elsewhere classified: Secondary | ICD-10-CM | POA: Diagnosis not present

## 2022-12-26 DIAGNOSIS — R262 Difficulty in walking, not elsewhere classified: Secondary | ICD-10-CM | POA: Diagnosis not present

## 2022-12-26 DIAGNOSIS — E119 Type 2 diabetes mellitus without complications: Secondary | ICD-10-CM | POA: Diagnosis not present

## 2022-12-26 DIAGNOSIS — L97512 Non-pressure chronic ulcer of other part of right foot with fat layer exposed: Secondary | ICD-10-CM

## 2022-12-26 DIAGNOSIS — M62571 Muscle wasting and atrophy, not elsewhere classified, right ankle and foot: Secondary | ICD-10-CM | POA: Diagnosis not present

## 2022-12-26 NOTE — Progress Notes (Signed)
Subjective:  Patient ID: Courtney Parks, female    DOB: July 28, 1961,  MRN: [id number]  Chief Complaint  Patient presents with   Foot Pain    Patient stated that PT wanted her to come in and have her Achilles checked out     62 y.o. female presents with the above complaint.  Patient presents with new complaint of left Achilles possible tear.  She states the wound has healed but she denies any.  You are welcome to schedule patient states that she has been having weakness in the Achilles she does not recall when the injury happened.  She is having some defect and that she has weakness in plantarflexion.  Pain scale is 0 out of 10 she is largely neuropathic.   Review of Systems: Negative except as noted in the HPI. Denies N/V/F/Ch.  Past Medical History:  Diagnosis Date   Anxiety    Depression    Diabetes mellitus, type II ([facility])    Hyperlipidemia    Hypertension    Kidney stones     Current Outpatient Medications:    gabapentin (NEURONTIN) 300 MG capsule, Take 1 capsule (300 mg total) by mouth 3 (three) times daily., Disp: 90 capsule, Rfl: 3   acetaminophen (TYLENOL) 500 MG tablet, Take 500 mg by mouth every 6 (six) hours as needed for moderate pain., Disp: , Rfl:    aspirin EC 81 MG tablet, Take 81 mg by mouth daily. Swallow whole., Disp: , Rfl:    Continuous Blood Gluc Sensor (FREESTYLE LIBRE 2 SENSOR) MISC, 1 each by Does not apply route PRO., Disp: 1 each, Rfl: 4   Dulaglutide (TRULICITY) 3 MG/0.5ML SOPN, Inject 3 mg into the skin once a week., Disp: 2 mL, Rfl: 11   ibuprofen (ADVIL) 200 MG tablet, Take 200-800 mg by mouth every 6 (six) hours as needed for moderate pain., Disp: , Rfl:    insulin glargine, 1 Unit Dial, (TOUJEO SOLOSTAR) 300 UNIT/ML Solostar Pen, Inject 42 Units into the skin daily in the afternoon. (Patient taking differently: Inject 64 Units into the skin daily in the afternoon.), Disp: 1.5 mL, Rfl: 1   insulin glargine, 1 Unit Dial, (TOUJEO) 300 UNIT/ML  Solostar Pen, Inject 68 Units into the skin daily.  Prime pen with 3 units before each use., Disp: 21 mL, Rfl: 99   Insulin Pen Needle (PEN NEEDLES 3/16") 31G X 5 MM MISC, 100 each by Does not apply route 4 (four) times daily., Disp: 100 each, Rfl: 3   Magnesium 100 MG CAPS, Take 100 mg by mouth daily., Disp: , Rfl:    ondansetron (ZOFRAN-ODT) 4 MG disintegrating tablet, Take 1 tablet (4 mg total) by mouth every 8 (eight) hours as needed for nausea or vomiting., Disp: 20 tablet, Rfl: 0   phenazopyridine (PYRIDIUM) 200 MG tablet, Take 1 tablet (200 mg total) by mouth 3 (three) times daily., Disp: 6 tablet, Rfl: 0   Probiotic Product (PROBIOTIC BLEND PO), Take 1 tablet by mouth daily., Disp: , Rfl:    TRULICITY 3 MG/0.5ML SOPN, Inject 3 mg into the skin once a week., Disp: , Rfl:    Turmeric 500 MG CAPS, Take 500 mg by mouth daily., Disp: , Rfl:    vitamin C (ASCORBIC ACID) 250 MG tablet, Take 250 mg by mouth daily., Disp: , Rfl:    zinc gluconate 50 MG tablet, Take 50 mg by mouth daily., Disp: , Rfl:   Social History   Tobacco Use  Smoking Status Former  Years: 5   Types: Cigarettes   Quit date: 12/12/1983   Years since quitting: 39.0  Smokeless Tobacco Never    Allergies  Allergen Reactions   Codeine     REACTION: Nausea   Demerol Nausea And Vomiting   Other Other (See Comments)    Narcotics- drops her blood pressure   Shellfish Allergy Swelling   Objective:  There were no vitals filed for this visit. There is no height or weight on file to calculate BMI. Constitutional Well developed. Well nourished.  Vascular Dorsalis pedis pulses faintly palpable bilaterally. Posterior tibial pulses faintly palpable bilaterally. Capillary refill normal to all digits.  No cyanosis or clubbing noted. Pedal hair growth normal.  Neurologic Normal speech. Oriented to person, place, and time. Epicritic sensation to light touch grossly present bilaterally.  Dermatologic Nails well groomed and  normal in appearance. No open wounds. No skin lesions.  Orthopedic: Reviewed.  Pain on palpation to the right Achilles tendon mildly.  Had to strike defect noted at the Achilles tendon partial tearing of the Achilles clinically appreciated.  2 out of 5 strength noted of ankle plantarflexion.  5 out of 5 with ankle dorsiflexion.  No pain at the posterior tibial tendon peroneal tendon ATFL ligament   Radiographs: None Assessment:   1. Ulcerated, foot, right, with fat layer exposed   2. Type 2 diabetes mellitus without complication, with long-term current use of insulin   3. Achilles tendon tear, left, initial encounter   4. Vascular abnormality    Plan:  Patient was evaluated and treated and all questions answered.  Right Achilles partial versus complete tear -All questions and concerns were discussed with the patient in extensive detail -This is likely chronic in nature however given that defect noted on the posterior Achilles patient will benefit from MRI evaluations to assess the tear -MRI was ordered  Vascular abnormality -She will benefit from newer ABIs PVRs to assess the vascular flow to the right lower extremity in preparation possible for surgery of the Achilles tendon  No follow-ups on file.

## 2022-12-28 ENCOUNTER — Other Ambulatory Visit (HOSPITAL_BASED_OUTPATIENT_CLINIC_OR_DEPARTMENT_OTHER): Payer: Self-pay

## 2022-12-29 ENCOUNTER — Other Ambulatory Visit (HOSPITAL_BASED_OUTPATIENT_CLINIC_OR_DEPARTMENT_OTHER): Payer: Self-pay

## 2023-01-02 ENCOUNTER — Other Ambulatory Visit (HOSPITAL_BASED_OUTPATIENT_CLINIC_OR_DEPARTMENT_OTHER): Payer: Self-pay

## 2023-01-03 ENCOUNTER — Other Ambulatory Visit (HOSPITAL_BASED_OUTPATIENT_CLINIC_OR_DEPARTMENT_OTHER): Payer: Self-pay

## 2023-01-03 DIAGNOSIS — Z794 Long term (current) use of insulin: Secondary | ICD-10-CM | POA: Diagnosis not present

## 2023-01-03 DIAGNOSIS — Z8639 Personal history of other endocrine, nutritional and metabolic disease: Secondary | ICD-10-CM | POA: Diagnosis not present

## 2023-01-03 DIAGNOSIS — E1165 Type 2 diabetes mellitus with hyperglycemia: Secondary | ICD-10-CM | POA: Diagnosis not present

## 2023-01-03 MED ORDER — TECHLITE PEN NEEDLES 31G X 5 MM MISC
1.0000 | Freq: Four times a day (QID) | 4 refills | Status: DC
  Filled 2023-01-03: qty 400, 90d supply, fill #0
  Filled 2023-01-08 – 2023-05-26 (×3): qty 300, 75d supply, fill #0
  Filled 2023-07-30: qty 300, 75d supply, fill #1
  Filled 2023-10-28: qty 300, 75d supply, fill #2

## 2023-01-03 MED ORDER — INSULIN LISPRO (1 UNIT DIAL) 100 UNIT/ML (KWIKPEN)
4.0000 [IU] | PEN_INJECTOR | Freq: Three times a day (TID) | SUBCUTANEOUS | 4 refills | Status: DC
  Filled 2023-01-03: qty 3, 25d supply, fill #0
  Filled 2023-01-08: qty 9, 75d supply, fill #0
  Filled 2023-03-19: qty 9, 75d supply, fill #1
  Filled 2023-07-03: qty 9, 75d supply, fill #2
  Filled 2023-10-05: qty 9, 75d supply, fill #3
  Filled 2023-12-16: qty 9, 75d supply, fill #4

## 2023-01-06 ENCOUNTER — Other Ambulatory Visit (HOSPITAL_BASED_OUTPATIENT_CLINIC_OR_DEPARTMENT_OTHER): Payer: Self-pay

## 2023-01-07 ENCOUNTER — Ambulatory Visit
Admission: RE | Admit: 2023-01-07 | Discharge: 2023-01-07 | Disposition: A | Discharge status: Home / Self Care | Payer: Commercial Managed Care - PPO | Source: Ambulatory Visit | Attending: Podiatry | Admitting: Podiatry

## 2023-01-07 ENCOUNTER — Ambulatory Visit (HOSPITAL_COMMUNITY)
Admission: RE | Admit: 2023-01-07 | Discharge: 2023-01-07 | Disposition: A | Discharge status: Home / Self Care | Payer: Commercial Managed Care - PPO | Source: Ambulatory Visit | Attending: Podiatry | Admitting: Podiatry

## 2023-01-07 ENCOUNTER — Encounter: Payer: Self-pay | Admitting: Podiatry

## 2023-01-07 DIAGNOSIS — I999 Unspecified disorder of circulatory system: Secondary | ICD-10-CM

## 2023-01-07 DIAGNOSIS — M25571 Pain in right ankle and joints of right foot: Secondary | ICD-10-CM | POA: Diagnosis not present

## 2023-01-07 DIAGNOSIS — S86012A Strain of left Achilles tendon, initial encounter: Secondary | ICD-10-CM

## 2023-01-07 DIAGNOSIS — S86391A Other injury of muscle(s) and tendon(s) of peroneal muscle group at lower leg level, right leg, initial encounter: Secondary | ICD-10-CM | POA: Diagnosis not present

## 2023-01-07 DIAGNOSIS — S86011A Strain of right Achilles tendon, initial encounter: Secondary | ICD-10-CM | POA: Diagnosis not present

## 2023-01-07 LAB — VAS US ABI WITH/WO TBI
Left ABI: 0.98
Right ABI: 0.97

## 2023-01-08 ENCOUNTER — Other Ambulatory Visit (HOSPITAL_BASED_OUTPATIENT_CLINIC_OR_DEPARTMENT_OTHER): Payer: Self-pay

## 2023-01-08 ENCOUNTER — Other Ambulatory Visit (HOSPITAL_COMMUNITY): Payer: Self-pay

## 2023-01-15 ENCOUNTER — Encounter: Payer: Self-pay | Admitting: Podiatry

## 2023-01-17 ENCOUNTER — Ambulatory Visit: Payer: Commercial Managed Care - PPO | Admitting: Podiatry

## 2023-01-17 DIAGNOSIS — E119 Type 2 diabetes mellitus without complications: Secondary | ICD-10-CM

## 2023-01-17 DIAGNOSIS — E1165 Type 2 diabetes mellitus with hyperglycemia: Secondary | ICD-10-CM | POA: Diagnosis not present

## 2023-01-17 DIAGNOSIS — Z794 Long term (current) use of insulin: Secondary | ICD-10-CM | POA: Diagnosis not present

## 2023-01-17 DIAGNOSIS — S86012A Strain of left Achilles tendon, initial encounter: Secondary | ICD-10-CM | POA: Diagnosis not present

## 2023-01-17 DIAGNOSIS — Z01818 Encounter for other preprocedural examination: Secondary | ICD-10-CM | POA: Diagnosis not present

## 2023-01-17 DIAGNOSIS — Z7689 Persons encountering health services in other specified circumstances: Secondary | ICD-10-CM | POA: Diagnosis not present

## 2023-01-17 DIAGNOSIS — S86011D Strain of right Achilles tendon, subsequent encounter: Secondary | ICD-10-CM | POA: Diagnosis not present

## 2023-01-17 DIAGNOSIS — Z6833 Body mass index (BMI) 33.0-33.9, adult: Secondary | ICD-10-CM | POA: Diagnosis not present

## 2023-01-17 DIAGNOSIS — R030 Elevated blood-pressure reading, without diagnosis of hypertension: Secondary | ICD-10-CM | POA: Diagnosis not present

## 2023-01-17 NOTE — Progress Notes (Signed)
Subjective:  Patient ID: Courtney Parks, female    DOB: 13-Jul-1961,  MRN: [id number]  Chief Complaint  Patient presents with   Foot Pain    62 y.o. female presents with the above complaint.  Patient presents with a fall to the right Achilles tendon possible tear.  She is here to go over the MRI scheduled surgery   Review of Systems: Negative except as noted in the HPI. Denies N/V/F/Ch.  Past Medical History:  Diagnosis Date   Anxiety    Depression    Diabetes mellitus, type II (HCC)    Hyperlipidemia    Hypertension    Kidney stones     Current Outpatient Medications:    gabapentin (NEURONTIN) 300 MG capsule, Take 1 capsule (300 mg total) by mouth 3 (three) times daily., Disp: 90 capsule, Rfl: 3   acetaminophen (TYLENOL) 500 MG tablet, Take 500 mg by mouth every 6 (six) hours as needed for moderate pain., Disp: , Rfl:    aspirin EC 81 MG tablet, Take 81 mg by mouth daily. Swallow whole., Disp: , Rfl:    Continuous Blood Gluc Sensor (FREESTYLE LIBRE 2 SENSOR) MISC, 1 each by Does not apply route PRO., Disp: 1 each, Rfl: 4   Dulaglutide (TRULICITY) 3 MG/0.5ML SOPN, Inject 3 mg into the skin once a week., Disp: 2 mL, Rfl: 11   ibuprofen (ADVIL) 200 MG tablet, Take 200-800 mg by mouth every 6 (six) hours as needed for moderate pain., Disp: , Rfl:    insulin glargine, 1 Unit Dial, (TOUJEO SOLOSTAR) 300 UNIT/ML Solostar Pen, Inject 42 Units into the skin daily in the afternoon. (Patient taking differently: Inject 64 Units into the skin daily in the afternoon.), Disp: 1.5 mL, Rfl: 1   insulin glargine, 1 Unit Dial, (TOUJEO) 300 UNIT/ML Solostar Pen, Inject 68 Units into the skin daily.  Prime pen with 3 units before each use., Disp: 21 mL, Rfl: 99   insulin lispro (HUMALOG KWIKPEN) 100 UNIT/ML KwikPen, Inject 4 Units into the skin up to 3 (three) times daily before meals., Disp: 15 mL, Rfl: 4   Insulin Pen Needle (PEN NEEDLES 3/16") 31G X 5 MM MISC, 100 each by Does not apply  route 4 (four) times daily., Disp: 100 each, Rfl: 3   Insulin Pen Needle (TECHLITE PEN NEEDLES) 31G X 5 MM MISC, Use 4 times daily, Disp: 400 each, Rfl: 4   losartan (COZAAR) 50 MG tablet, Take 1 tablet (50 mg) by mouth daily., Disp: 30 tablet, Rfl: 0   Magnesium 100 MG CAPS, Take 100 mg by mouth daily., Disp: , Rfl:    ondansetron (ZOFRAN-ODT) 4 MG disintegrating tablet, Take 1 tablet (4 mg total) by mouth every 8 (eight) hours as needed for nausea or vomiting., Disp: 20 tablet, Rfl: 0   phenazopyridine (PYRIDIUM) 200 MG tablet, Take 1 tablet (200 mg total) by mouth 3 (three) times daily., Disp: 6 tablet, Rfl: 0   Probiotic Product (PROBIOTIC BLEND PO), Take 1 tablet by mouth daily., Disp: , Rfl:    TRULICITY 3 MG/0.5ML SOPN, Inject 3 mg into the skin once a week., Disp: , Rfl:    Turmeric 500 MG CAPS, Take 500 mg by mouth daily., Disp: , Rfl:    vitamin C (ASCORBIC ACID) 250 MG tablet, Take 250 mg by mouth daily., Disp: , Rfl:    zinc gluconate 50 MG tablet, Take 50 mg by mouth daily., Disp: , Rfl:   Social History   Tobacco Use  Smoking Status  Former   Years: 5   Types: Cigarettes   Quit date: 12/12/1983   Years since quitting: 39.1  Smokeless Tobacco Never    Allergies  Allergen Reactions   Codeine     REACTION: Nausea   Demerol Nausea And Vomiting   Other Other (See Comments)    Narcotics- drops her blood pressure   Shellfish Allergy Swelling   Objective:  There were no vitals filed for this visit. There is no height or weight on file to calculate BMI. Constitutional Well developed. Well nourished.  Vascular Dorsalis pedis pulses faintly palpable bilaterally. Posterior tibial pulses faintly palpable bilaterally. Capillary refill normal to all digits.  No cyanosis or clubbing noted. Pedal hair growth normal.  Neurologic Normal speech. Oriented to person, place, and time. Epicritic sensation to light touch grossly present bilaterally.  Dermatologic Nails well groomed  and normal in appearance. No open wounds. No skin lesions.  Orthopedic: Reviewed.  Pain on palpation to the right Achilles tendon mildly.  Had to strike defect noted at the Achilles tendon partial tearing of the Achilles clinically appreciated.  2 out of 5 strength noted of ankle plantarflexion.  5 out of 5 with ankle dorsiflexion.  No pain at the posterior tibial tendon peroneal tendon ATFL ligament   Radiographs: None Assessment:   No diagnosis found.  Plan:  Patient was evaluated and treated and all questions answered.  Right Achilles partial versus complete tear with underlying gastrocnemius equinus -All questions and concerns were discussed with the patient in extensive detail -MRI shows tearing of the Achilles tendon with 4 cm retraction from distal attachment.  I discussed this finding with the patient in extensive detail.  I also discussed with her given her diabetes and healing issues she can develop wound complication can end up losing her leg I discussed this with the patient she states understanding and would like to proceed with surgery.  She will benefit from percutaneous Achilles tendon repair versus open approach with gastrocnemius recession -Informed surgical risk consent was reviewed and read aloud to the patient.  I reviewed the films.  I have discussed my findings with the patient in great detail.  I have discussed all risks including but not limited to infection, stiffness, scarring, limp, disability, deformity, damage to blood vessels and nerves, numbness, poor healing, need for braces, arthritis, chronic pain, amputation, death.  All benefits and realistic expectations discussed in great detail.  I have made no promises as to the outcome.  I have provided realistic expectations.  I have offered the patient a 2nd opinion, which they have declined and assured me they preferred to proceed despite the risks   Vascular abnormality -New ABIs PVRs were reviewed which show adequate  flow to the right lower extremity to heal Achilles tendon surgery  No follow-ups on file.

## 2023-01-18 ENCOUNTER — Other Ambulatory Visit (HOSPITAL_COMMUNITY): Payer: Self-pay

## 2023-01-21 ENCOUNTER — Other Ambulatory Visit (HOSPITAL_COMMUNITY): Payer: Self-pay

## 2023-01-21 MED ORDER — LOSARTAN POTASSIUM 50 MG PO TABS
50.0000 mg | ORAL_TABLET | Freq: Every day | ORAL | 0 refills | Status: DC
  Filled 2023-01-21: qty 30, 30d supply, fill #0

## 2023-01-23 ENCOUNTER — Other Ambulatory Visit: Payer: Self-pay

## 2023-01-23 ENCOUNTER — Other Ambulatory Visit (HOSPITAL_COMMUNITY): Payer: Self-pay

## 2023-01-25 ENCOUNTER — Encounter: Payer: Self-pay | Admitting: Podiatry

## 2023-01-28 ENCOUNTER — Telehealth: Payer: Self-pay | Admitting: Urology

## 2023-01-28 NOTE — Telephone Encounter (Signed)
DOS - 02/17/23  REPAIR ACHILLES TENDON RIGHT --- [address]109 GASTROCNEMIUS RECESS RIGHT --- 60454 POSS. TENDON TRANSFER RIGHT --- [phone]  [employer] EFFECTIVE DATE - 09/02/22  SPOKE WITH JURD WITH [employer] AND SHE STATED THAT FOR CPT CODES 91478, [phone] AND [phone] NO PRIOR AUTH IS REQUIRED.   CALL REF # [id number]

## 2023-02-06 ENCOUNTER — Other Ambulatory Visit (HOSPITAL_BASED_OUTPATIENT_CLINIC_OR_DEPARTMENT_OTHER): Payer: Self-pay

## 2023-02-10 ENCOUNTER — Encounter: Payer: Self-pay | Admitting: Podiatry

## 2023-02-11 ENCOUNTER — Other Ambulatory Visit: Payer: Self-pay | Admitting: Podiatry

## 2023-02-11 MED ORDER — SULFAMETHOXAZOLE-TRIMETHOPRIM 800-160 MG PO TABS: 1.0000 | ORAL_TABLET | Freq: Two times a day (BID) | ORAL | 0 refills | Status: AC

## 2023-02-13 ENCOUNTER — Other Ambulatory Visit (HOSPITAL_COMMUNITY): Payer: Self-pay

## 2023-02-14 ENCOUNTER — Other Ambulatory Visit (HOSPITAL_COMMUNITY): Payer: Self-pay

## 2023-02-17 ENCOUNTER — Encounter: Payer: Self-pay | Admitting: Podiatry

## 2023-02-17 ENCOUNTER — Other Ambulatory Visit: Payer: Self-pay | Admitting: Podiatry

## 2023-02-17 DIAGNOSIS — S86021A Laceration of right Achilles tendon, initial encounter: Secondary | ICD-10-CM | POA: Diagnosis not present

## 2023-02-17 DIAGNOSIS — G8918 Other acute postprocedural pain: Secondary | ICD-10-CM | POA: Diagnosis not present

## 2023-02-17 DIAGNOSIS — S86012A Strain of left Achilles tendon, initial encounter: Secondary | ICD-10-CM | POA: Diagnosis not present

## 2023-02-17 DIAGNOSIS — S86011A Strain of right Achilles tendon, initial encounter: Secondary | ICD-10-CM | POA: Diagnosis not present

## 2023-02-17 MED ORDER — ONDANSETRON HCL 4 MG PO TABS: 4.0000 mg | ORAL_TABLET | Freq: Three times a day (TID) | ORAL | 0 refills | Status: AC | PRN

## 2023-02-17 MED ORDER — IBUPROFEN 800 MG PO TABS: 800.0000 mg | ORAL_TABLET | Freq: Four times a day (QID) | ORAL | 1 refills | Status: AC | PRN

## 2023-02-17 MED ORDER — OXYCODONE-ACETAMINOPHEN 5-325 MG PO TABS: 1.0000 | ORAL_TABLET | ORAL | 0 refills | Status: AC | PRN

## 2023-02-18 ENCOUNTER — Encounter: Payer: Self-pay | Admitting: Podiatry

## 2023-02-19 ENCOUNTER — Encounter: Payer: Self-pay | Admitting: Podiatry

## 2023-02-19 DIAGNOSIS — R1011 Right upper quadrant pain: Secondary | ICD-10-CM | POA: Diagnosis not present

## 2023-02-19 DIAGNOSIS — Z9889 Other specified postprocedural states: Secondary | ICD-10-CM | POA: Diagnosis not present

## 2023-02-25 ENCOUNTER — Other Ambulatory Visit (HOSPITAL_BASED_OUTPATIENT_CLINIC_OR_DEPARTMENT_OTHER): Payer: Self-pay

## 2023-02-26 ENCOUNTER — Ambulatory Visit (INDEPENDENT_AMBULATORY_CARE_PROVIDER_SITE_OTHER): Payer: Commercial Managed Care - PPO | Admitting: Podiatry

## 2023-02-26 ENCOUNTER — Other Ambulatory Visit (HOSPITAL_BASED_OUTPATIENT_CLINIC_OR_DEPARTMENT_OTHER): Payer: Self-pay

## 2023-02-26 DIAGNOSIS — S86012A Strain of left Achilles tendon, initial encounter: Secondary | ICD-10-CM

## 2023-02-26 MED ORDER — LOSARTAN POTASSIUM 50 MG PO TABS
50.0000 mg | ORAL_TABLET | Freq: Every day | ORAL | 0 refills | Status: DC
  Filled 2023-02-26: qty 90, 90d supply, fill #0

## 2023-02-26 NOTE — Progress Notes (Signed)
Subjective:  Patient ID: Courtney Parks, female    DOB: 1961/01/28,  MRN: [id number]  Chief Complaint  Patient presents with   Routine Post Op    POV #1 DOS 02/17/2023 RT ACHILLES TENDON REPAIR W/PRIMARY RESECTION & DEBRIDEMENT OF TENDON, GASTROCNEMIUS RECESS AS NEEDED W/POSS TENDON TRANSFER    DOS: 02/17/2023 Procedure: Right Achilles tendon repair  62 y.o. female returns for post-op check.  Patient states that she is doing well.  Minimal pain.  Nonweightbearing to the right lower extremity.  Review of Systems: Negative except as noted in the HPI. Denies N/V/F/Ch.  Past Medical History:  Diagnosis Date   Anxiety    Depression    Diabetes mellitus, type II (HCC)    Hyperlipidemia    Hypertension    Kidney stones     Current Outpatient Medications:    gabapentin (NEURONTIN) 300 MG capsule, Take 1 capsule (300 mg total) by mouth 3 (three) times daily., Disp: 90 capsule, Rfl: 3   acetaminophen (TYLENOL) 500 MG tablet, Take 500 mg by mouth every 6 (six) hours as needed for moderate pain., Disp: , Rfl:    aspirin EC 81 MG tablet, Take 81 mg by mouth daily. Swallow whole., Disp: , Rfl:    Continuous Blood Gluc Sensor (FREESTYLE LIBRE 2 SENSOR) MISC, 1 each by Does not apply route PRO., Disp: 1 each, Rfl: 4   Dulaglutide (TRULICITY) 3 MG/0.5ML SOPN, Inject 3 mg into the skin once a week., Disp: 2 mL, Rfl: 11   ibuprofen (ADVIL) 200 MG tablet, Take 200-800 mg by mouth every 6 (six) hours as needed for moderate pain., Disp: , Rfl:    ibuprofen (ADVIL) 800 MG tablet, Take 1 tablet (800 mg total) by mouth every 6 (six) hours as needed., Disp: 60 tablet, Rfl: 1   insulin glargine, 1 Unit Dial, (TOUJEO SOLOSTAR) 300 UNIT/ML Solostar Pen, Inject 42 Units into the skin daily in the afternoon. (Patient taking differently: Inject 64 Units into the skin daily in the afternoon.), Disp: 1.5 mL, Rfl: 1   insulin glargine, 1 Unit Dial, (TOUJEO) 300 UNIT/ML Solostar Pen, Inject 68 Units into  the skin daily.  Prime pen with 3 units before each use., Disp: 21 mL, Rfl: 99   insulin lispro (HUMALOG KWIKPEN) 100 UNIT/ML KwikPen, Inject 4 Units into the skin up to 3 (three) times daily before meals., Disp: 15 mL, Rfl: 4   Insulin Pen Needle (PEN NEEDLES 3/16") 31G X 5 MM MISC, 100 each by Does not apply route 4 (four) times daily., Disp: 100 each, Rfl: 3   Insulin Pen Needle (TECHLITE PEN NEEDLES) 31G X 5 MM MISC, Use 4 times daily, Disp: 400 each, Rfl: 4   losartan (COZAAR) 50 MG tablet, Take 1 tablet (50 mg total) by mouth daily., Disp: 90 tablet, Rfl: 0   Magnesium 100 MG CAPS, Take 100 mg by mouth daily., Disp: , Rfl:    ondansetron (ZOFRAN-ODT) 4 MG disintegrating tablet, Take 1 tablet (4 mg total) by mouth every 8 (eight) hours as needed for nausea or vomiting., Disp: 20 tablet, Rfl: 0   oxyCODONE-acetaminophen (PERCOCET) 5-325 MG tablet, Take 1 tablet by mouth every 4 (four) hours as needed for severe pain., Disp: 30 tablet, Rfl: 0   phenazopyridine (PYRIDIUM) 200 MG tablet, Take 1 tablet (200 mg total) by mouth 3 (three) times daily., Disp: 6 tablet, Rfl: 0   Probiotic Product (PROBIOTIC BLEND PO), Take 1 tablet by mouth daily., Disp: , Rfl:    sulfamethoxazole-trimethoprim (  BACTRIM DS) 800-160 MG tablet, Take 1 tablet by mouth 2 (two) times daily., Disp: 28 tablet, Rfl: 0   TRULICITY 3 MG/0.5ML SOPN, Inject 3 mg into the skin once a week., Disp: , Rfl:    Turmeric 500 MG CAPS, Take 500 mg by mouth daily., Disp: , Rfl:    vitamin C (ASCORBIC ACID) 250 MG tablet, Take 250 mg by mouth daily., Disp: , Rfl:    zinc gluconate 50 MG tablet, Take 50 mg by mouth daily., Disp: , Rfl:   Social History   Tobacco Use  Smoking Status Former   Years: 5   Types: Cigarettes   Quit date: 12/12/1983   Years since quitting: 39.2  Smokeless Tobacco Never    Allergies  Allergen Reactions   Codeine     REACTION: Nausea   Demerol Nausea And Vomiting   Other Other (See Comments)    Narcotics-  drops her blood pressure   Shellfish Allergy Swelling   Objective:  There were no vitals filed for this visit. There is no height or weight on file to calculate BMI. Constitutional Well developed. Well nourished.  Vascular Foot warm and well perfused. Capillary refill normal to all digits.   Neurologic Normal speech. Oriented to person, place, and time. Epicritic sensation to light touch grossly present bilaterally.  Dermatologic Skin healing well without signs of infection. Skin edges well coapted without signs of infection.  Orthopedic: Tenderness to palpation noted about the surgical site.   Radiographs: None Assessment:  No diagnosis found. Plan:  Patient was evaluated and treated and all questions answered.  S/p foot surgery right -Progressing as expected post-operatively. -XR: See above -WB Status: Nonweightbearing in right lower extremity -Sutures: Intact.  No clinical signs of Deis is noted no complication noted. -Medications: None -Foot redressed.  No follow-ups on file.

## 2023-03-12 ENCOUNTER — Encounter: Payer: Self-pay | Admitting: Podiatry

## 2023-03-12 ENCOUNTER — Ambulatory Visit (INDEPENDENT_AMBULATORY_CARE_PROVIDER_SITE_OTHER): Payer: Commercial Managed Care - PPO | Admitting: Podiatry

## 2023-03-12 DIAGNOSIS — S86012A Strain of left Achilles tendon, initial encounter: Secondary | ICD-10-CM

## 2023-03-12 DIAGNOSIS — Z794 Long term (current) use of insulin: Secondary | ICD-10-CM

## 2023-03-12 DIAGNOSIS — E119 Type 2 diabetes mellitus without complications: Secondary | ICD-10-CM

## 2023-03-12 NOTE — Progress Notes (Signed)
Subjective:  Patient ID: Courtney Parks, female    DOB: 02-14-1961,  MRN: [id number]  Chief Complaint  Patient presents with   Routine Post Op    OV #2 DOS 02/17/2023 RT ACHILLES TENDON REPAIR W/PRIMARY RESECTION & DEBRIDEMENT OF TENDON, GASTROCNEMIUS RECESS AS NEEDED W/POSS TENDON TRANSFER    DOS: 02/17/2023 Procedure: Right Achilles tendon repair  62 y.o. female returns for post-op check.  Patient states that she is doing well.  Minimal pain.  Nonweightbearing to the right lower extremity.  Review of Systems: Negative except as noted in the HPI. Denies N/V/F/Ch.  Past Medical History:  Diagnosis Date   Anxiety    Depression    Diabetes mellitus, type II (HCC)    Hyperlipidemia    Hypertension    Kidney stones     Current Outpatient Medications:    gabapentin (NEURONTIN) 300 MG capsule, Take 1 capsule (300 mg total) by mouth 3 (three) times daily., Disp: 90 capsule, Rfl: 3   acetaminophen (TYLENOL) 500 MG tablet, Take 500 mg by mouth every 6 (six) hours as needed for moderate pain., Disp: , Rfl:    aspirin EC 81 MG tablet, Take 81 mg by mouth daily. Swallow whole., Disp: , Rfl:    Continuous Blood Gluc Sensor (FREESTYLE LIBRE 2 SENSOR) MISC, 1 each by Does not apply route PRO., Disp: 1 each, Rfl: 4   Dulaglutide (TRULICITY) 3 MG/0.5ML SOPN, Inject 3 mg into the skin once a week., Disp: 2 mL, Rfl: 11   ibuprofen (ADVIL) 200 MG tablet, Take 200-800 mg by mouth every 6 (six) hours as needed for moderate pain., Disp: , Rfl:    ibuprofen (ADVIL) 800 MG tablet, Take 1 tablet (800 mg total) by mouth every 6 (six) hours as needed., Disp: 60 tablet, Rfl: 1   insulin glargine, 1 Unit Dial, (TOUJEO SOLOSTAR) 300 UNIT/ML Solostar Pen, Inject 42 Units into the skin daily in the afternoon. (Patient taking differently: Inject 64 Units into the skin daily in the afternoon.), Disp: 1.5 mL, Rfl: 1   insulin glargine, 1 Unit Dial, (TOUJEO) 300 UNIT/ML Solostar Pen, Inject 68 Units into the  skin daily.  Prime pen with 3 units before each use., Disp: 21 mL, Rfl: 99   insulin lispro (HUMALOG KWIKPEN) 100 UNIT/ML KwikPen, Inject 4 Units into the skin up to 3 (three) times daily before meals., Disp: 15 mL, Rfl: 4   Insulin Pen Needle (PEN NEEDLES 3/16") 31G X 5 MM MISC, 100 each by Does not apply route 4 (four) times daily., Disp: 100 each, Rfl: 3   Insulin Pen Needle (TECHLITE PEN NEEDLES) 31G X 5 MM MISC, Use 4 times daily, Disp: 400 each, Rfl: 4   losartan (COZAAR) 50 MG tablet, Take 1 tablet (50 mg total) by mouth daily., Disp: 90 tablet, Rfl: 0   Magnesium 100 MG CAPS, Take 100 mg by mouth daily., Disp: , Rfl:    ondansetron (ZOFRAN-ODT) 4 MG disintegrating tablet, Take 1 tablet (4 mg total) by mouth every 8 (eight) hours as needed for nausea or vomiting., Disp: 20 tablet, Rfl: 0   oxyCODONE-acetaminophen (PERCOCET) 5-325 MG tablet, Take 1 tablet by mouth every 4 (four) hours as needed for severe pain., Disp: 30 tablet, Rfl: 0   phenazopyridine (PYRIDIUM) 200 MG tablet, Take 1 tablet (200 mg total) by mouth 3 (three) times daily., Disp: 6 tablet, Rfl: 0   Probiotic Product (PROBIOTIC BLEND PO), Take 1 tablet by mouth daily., Disp: , Rfl:    sulfamethoxazole-trimethoprim (  BACTRIM DS) 800-160 MG tablet, Take 1 tablet by mouth 2 (two) times daily., Disp: 28 tablet, Rfl: 0   TRULICITY 3 MG/0.5ML SOPN, Inject 3 mg into the skin once a week., Disp: , Rfl:    Turmeric 500 MG CAPS, Take 500 mg by mouth daily., Disp: , Rfl:    vitamin C (ASCORBIC ACID) 250 MG tablet, Take 250 mg by mouth daily., Disp: , Rfl:    zinc gluconate 50 MG tablet, Take 50 mg by mouth daily., Disp: , Rfl:   Social History   Tobacco Use  Smoking Status Former   Years: 5   Types: Cigarettes   Quit date: 12/12/1983   Years since quitting: 39.2  Smokeless Tobacco Never    Allergies  Allergen Reactions   Codeine     REACTION: Nausea   Demerol Nausea And Vomiting   Other Other (See Comments)    Narcotics-  drops her blood pressure   Shellfish Allergy Swelling   Objective:  There were no vitals filed for this visit. There is no height or weight on file to calculate BMI. Constitutional Well developed. Well nourished.  Vascular Foot warm and well perfused. Capillary refill normal to all digits.   Neurologic Normal speech. Oriented to person, place, and time. Epicritic sensation to light touch grossly present bilaterally.  Dermatologic Skin mostly reepithelialized..  No signs of infection noted.  Good range of motion noted at the ankle joint Achilles functions are intact  Orthopedic: Tenderness to palpation noted about the surgical site.   Radiographs: None Assessment:   1. Achilles tendon tear, left, initial encounter   2. Type 2 diabetes mellitus without complication, with long-term current use of insulin (HCC)    Plan:  Patient was evaluated and treated and all questions answered.  S/p foot surgery right -Progressing as expected post-operatively. -XR: See above -WB Status: Begin slowly weightbearing as tolerated in cam boot to the right side -Sutures: Removed no clinical signs of Deis is noted no complication noted. -Medications: None -Foot redressed.  No follow-ups on file.

## 2023-03-19 ENCOUNTER — Other Ambulatory Visit: Payer: Self-pay

## 2023-03-19 ENCOUNTER — Other Ambulatory Visit (HOSPITAL_COMMUNITY): Payer: Self-pay

## 2023-03-24 DIAGNOSIS — M25571 Pain in right ankle and joints of right foot: Secondary | ICD-10-CM | POA: Diagnosis not present

## 2023-03-24 DIAGNOSIS — M25471 Effusion, right ankle: Secondary | ICD-10-CM | POA: Diagnosis not present

## 2023-04-03 ENCOUNTER — Other Ambulatory Visit (HOSPITAL_COMMUNITY): Payer: Self-pay

## 2023-04-07 DIAGNOSIS — M25571 Pain in right ankle and joints of right foot: Secondary | ICD-10-CM | POA: Diagnosis not present

## 2023-04-07 DIAGNOSIS — M25471 Effusion, right ankle: Secondary | ICD-10-CM | POA: Diagnosis not present

## 2023-04-09 ENCOUNTER — Encounter: Payer: Commercial Managed Care - PPO | Admitting: Podiatry

## 2023-04-15 ENCOUNTER — Encounter: Payer: Self-pay | Admitting: Podiatry

## 2023-04-17 ENCOUNTER — Other Ambulatory Visit: Payer: Self-pay

## 2023-04-18 ENCOUNTER — Ambulatory Visit: Payer: Commercial Managed Care - PPO | Admitting: Podiatry

## 2023-04-18 DIAGNOSIS — M25471 Effusion, right ankle: Secondary | ICD-10-CM | POA: Diagnosis not present

## 2023-04-18 DIAGNOSIS — S86011D Strain of right Achilles tendon, subsequent encounter: Secondary | ICD-10-CM

## 2023-04-18 DIAGNOSIS — M25571 Pain in right ankle and joints of right foot: Secondary | ICD-10-CM | POA: Diagnosis not present

## 2023-04-18 NOTE — Addendum Note (Signed)
Addended by: Nicholes Rough on: 04/18/2023 10:52 AM   Modules accepted: Level of Service

## 2023-04-18 NOTE — Progress Notes (Signed)
Subjective:  Patient ID: Courtney Parks, female    DOB: [birth year],  MRN: [id number]  Chief Complaint  Patient presents with   Routine Post Op    DOS: 02/17/2023 Procedure: Right Achilles tendon repair  62 y.o. female returns for post-op check.  Patient states that she is doing well.  Minimal pain.  Nonweightbearing to the right lower extremity.  Review of Systems: Negative except as noted in the HPI. Denies N/V/F/Ch.  Past Medical History:  Diagnosis Date   Anxiety    Depression    Diabetes mellitus, type II (HCC)    Hyperlipidemia    Hypertension    Kidney stones     Current Outpatient Medications:    gabapentin (NEURONTIN) 300 MG capsule, Take 1 capsule (300 mg total) by mouth 3 (three) times daily., Disp: 90 capsule, Rfl: 3   acetaminophen (TYLENOL) 500 MG tablet, Take 500 mg by mouth every 6 (six) hours as needed for moderate pain., Disp: , Rfl:    aspirin EC 81 MG tablet, Take 81 mg by mouth daily. Swallow whole., Disp: , Rfl:    Continuous Blood Gluc Sensor (FREESTYLE LIBRE 2 SENSOR) MISC, 1 each by Does not apply route PRO., Disp: 1 each, Rfl: 4   Dulaglutide (TRULICITY) 3 MG/0.5ML SOPN, Inject 3 mg into the skin once a week., Disp: 2 mL, Rfl: 11   ibuprofen (ADVIL) 200 MG tablet, Take 200-800 mg by mouth every 6 (six) hours as needed for moderate pain., Disp: , Rfl:    ibuprofen (ADVIL) 800 MG tablet, Take 1 tablet (800 mg total) by mouth every 6 (six) hours as needed., Disp: 60 tablet, Rfl: 1   insulin glargine, 1 Unit Dial, (TOUJEO SOLOSTAR) 300 UNIT/ML Solostar Pen, Inject 42 Units into the skin daily in the afternoon. (Patient taking differently: Inject 64 Units into the skin daily in the afternoon.), Disp: 1.5 mL, Rfl: 1   insulin glargine, 1 Unit Dial, (TOUJEO) 300 UNIT/ML Solostar Pen, Inject 68 Units into the skin daily.  Prime pen with 3 units before each use., Disp: 21 mL, Rfl: 99   insulin lispro (HUMALOG KWIKPEN) 100 UNIT/ML KwikPen, Inject 4 Units  into the skin up to 3 (three) times daily before meals., Disp: 15 mL, Rfl: 4   Insulin Pen Needle (PEN NEEDLES 3/16") 31G X 5 MM MISC, 100 each by Does not apply route 4 (four) times daily., Disp: 100 each, Rfl: 3   Insulin Pen Needle (TECHLITE PEN NEEDLES) 31G X 5 MM MISC, Use 4 times daily, Disp: 400 each, Rfl: 4   losartan (COZAAR) 50 MG tablet, Take 1 tablet (50 mg total) by mouth daily., Disp: 90 tablet, Rfl: 0   Magnesium 100 MG CAPS, Take 100 mg by mouth daily., Disp: , Rfl:    ondansetron (ZOFRAN-ODT) 4 MG disintegrating tablet, Take 1 tablet (4 mg total) by mouth every 8 (eight) hours as needed for nausea or vomiting., Disp: 20 tablet, Rfl: 0   oxyCODONE-acetaminophen (PERCOCET) 5-325 MG tablet, Take 1 tablet by mouth every 4 (four) hours as needed for severe pain., Disp: 30 tablet, Rfl: 0   phenazopyridine (PYRIDIUM) 200 MG tablet, Take 1 tablet (200 mg total) by mouth 3 (three) times daily., Disp: 6 tablet, Rfl: 0   Probiotic Product (PROBIOTIC BLEND PO), Take 1 tablet by mouth daily., Disp: , Rfl:    sulfamethoxazole-trimethoprim (BACTRIM DS) 800-160 MG tablet, Take 1 tablet by mouth 2 (two) times daily., Disp: 28 tablet, Rfl: 0   TRULICITY 3 MG/0.5ML  SOPN, Inject 3 mg into the skin once a week., Disp: , Rfl:    Turmeric 500 MG CAPS, Take 500 mg by mouth daily., Disp: , Rfl:    vitamin C (ASCORBIC ACID) 250 MG tablet, Take 250 mg by mouth daily., Disp: , Rfl:    zinc gluconate 50 MG tablet, Take 50 mg by mouth daily., Disp: , Rfl:   Social History   Tobacco Use  Smoking Status Former   Current packs/day: 0.00   Types: Cigarettes   Start date: 12/12/1978   Quit date: 12/12/1983   Years since quitting: 39.3  Smokeless Tobacco Never    Allergies  Allergen Reactions   Codeine     REACTION: Nausea   Demerol Nausea And Vomiting   Other Other (See Comments)    Narcotics- drops her blood pressure   Shellfish Allergy Swelling   Objective:  There were no vitals filed for this  visit. There is no height or weight on file to calculate BMI. Constitutional Well developed. Well nourished.  Vascular Foot warm and well perfused. Capillary refill normal to all digits.   Neurologic Normal speech. Oriented to person, place, and time. Epicritic sensation to light touch grossly present bilaterally.  Dermatologic Skin mostly reepithelialized..  No signs of infection noted.  Good range of motion noted at the ankle joint Achilles functions are intact  Orthopedic: Tenderness to palpation noted about the surgical site.   Radiographs: None Assessment:   1. Achilles tendon tear, left, initial encounter     Plan:  Patient was evaluated and treated and all questions answered.  S/p foot surgery right -Clinically doing much better.  Improved range of motion.  Left Tri-Lock ankle brace was dispensed she can wear ankle brace and going to regular shoes.  She can discontinue the boot. -I will see her back for final follow-up in 8 weeks.  Tri-Lock ankle brace was dispensed  No follow-ups on file.

## 2023-04-23 DIAGNOSIS — M25471 Effusion, right ankle: Secondary | ICD-10-CM | POA: Diagnosis not present

## 2023-04-23 DIAGNOSIS — M25571 Pain in right ankle and joints of right foot: Secondary | ICD-10-CM | POA: Diagnosis not present

## 2023-04-28 ENCOUNTER — Other Ambulatory Visit: Payer: Self-pay

## 2023-05-02 ENCOUNTER — Other Ambulatory Visit (HOSPITAL_COMMUNITY): Payer: Self-pay

## 2023-05-07 DIAGNOSIS — Z6835 Body mass index (BMI) 35.0-35.9, adult: Secondary | ICD-10-CM | POA: Diagnosis not present

## 2023-05-07 DIAGNOSIS — R2243 Localized swelling, mass and lump, lower limb, bilateral: Secondary | ICD-10-CM | POA: Diagnosis not present

## 2023-05-07 DIAGNOSIS — R0609 Other forms of dyspnea: Secondary | ICD-10-CM | POA: Diagnosis not present

## 2023-05-07 DIAGNOSIS — E1165 Type 2 diabetes mellitus with hyperglycemia: Secondary | ICD-10-CM | POA: Diagnosis not present

## 2023-05-08 ENCOUNTER — Other Ambulatory Visit (HOSPITAL_COMMUNITY): Payer: Self-pay

## 2023-05-14 ENCOUNTER — Other Ambulatory Visit: Payer: Self-pay

## 2023-05-14 ENCOUNTER — Other Ambulatory Visit (HOSPITAL_COMMUNITY): Payer: Self-pay

## 2023-05-15 ENCOUNTER — Other Ambulatory Visit (HOSPITAL_COMMUNITY): Payer: Self-pay

## 2023-05-15 ENCOUNTER — Other Ambulatory Visit: Payer: Self-pay

## 2023-05-21 DIAGNOSIS — M25571 Pain in right ankle and joints of right foot: Secondary | ICD-10-CM | POA: Diagnosis not present

## 2023-05-21 DIAGNOSIS — M25471 Effusion, right ankle: Secondary | ICD-10-CM | POA: Diagnosis not present

## 2023-05-26 ENCOUNTER — Other Ambulatory Visit (HOSPITAL_BASED_OUTPATIENT_CLINIC_OR_DEPARTMENT_OTHER): Payer: Self-pay

## 2023-05-26 ENCOUNTER — Other Ambulatory Visit (HOSPITAL_COMMUNITY): Payer: Self-pay

## 2023-05-26 ENCOUNTER — Other Ambulatory Visit: Payer: Self-pay

## 2023-05-27 ENCOUNTER — Other Ambulatory Visit (HOSPITAL_BASED_OUTPATIENT_CLINIC_OR_DEPARTMENT_OTHER): Payer: Self-pay

## 2023-05-27 MED ORDER — LOSARTAN POTASSIUM 50 MG PO TABS
50.0000 mg | ORAL_TABLET | Freq: Every day | ORAL | 0 refills | Status: DC
  Filled 2023-05-27: qty 90, 90d supply, fill #0

## 2023-06-02 DIAGNOSIS — M25471 Effusion, right ankle: Secondary | ICD-10-CM | POA: Diagnosis not present

## 2023-06-02 DIAGNOSIS — M25571 Pain in right ankle and joints of right foot: Secondary | ICD-10-CM | POA: Diagnosis not present

## 2023-06-09 ENCOUNTER — Other Ambulatory Visit (HOSPITAL_COMMUNITY): Payer: Self-pay

## 2023-06-12 ENCOUNTER — Other Ambulatory Visit (HOSPITAL_COMMUNITY): Payer: Self-pay

## 2023-06-13 ENCOUNTER — Other Ambulatory Visit (HOSPITAL_COMMUNITY): Payer: Self-pay

## 2023-06-13 ENCOUNTER — Ambulatory Visit: Payer: Commercial Managed Care - PPO | Admitting: Podiatry

## 2023-06-13 ENCOUNTER — Ambulatory Visit (INDEPENDENT_AMBULATORY_CARE_PROVIDER_SITE_OTHER): Payer: Commercial Managed Care - PPO

## 2023-06-13 DIAGNOSIS — M25471 Effusion, right ankle: Secondary | ICD-10-CM | POA: Diagnosis not present

## 2023-06-13 DIAGNOSIS — M2041 Other hammer toe(s) (acquired), right foot: Secondary | ICD-10-CM | POA: Diagnosis not present

## 2023-06-13 DIAGNOSIS — Z01818 Encounter for other preprocedural examination: Secondary | ICD-10-CM | POA: Diagnosis not present

## 2023-06-13 DIAGNOSIS — M25871 Other specified joint disorders, right ankle and foot: Secondary | ICD-10-CM | POA: Diagnosis not present

## 2023-06-13 DIAGNOSIS — M25571 Pain in right ankle and joints of right foot: Secondary | ICD-10-CM | POA: Diagnosis not present

## 2023-06-13 NOTE — Progress Notes (Signed)
Subjective:  Patient ID: Courtney Parks, female    DOB: [birth year],  MRN: [id number]  Chief Complaint  Patient presents with   Routine Post Op    DOING WELL, WANTS TO TALK TO YOU ABOUT HER TOES ON HER RIGHT FOOT     62 y.o. female presents with the above complaint.  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  Patient states is rubbing on it and is causing issues and she does not want to lose further body parts if she can avoid it.  She would like to discuss surgical options to help correct that.  She also has a contracture of the third toe leading to distal tip hyperkeratotic lesion.  She would like to discuss options for that she is tried shoe gear modification padding protecting however due to deformities from previous surgeries they have not been helpful.   Review of Systems: Negative except as noted in the HPI. Denies N/V/F/Ch.  Past Medical History:  Diagnosis Date   Anxiety    Depression    Diabetes mellitus, type II (HCC)    Hyperlipidemia    Hypertension    Kidney stones     Current Outpatient Medications:    gabapentin (NEURONTIN) 300 MG capsule, Take 1 capsule (300 mg total) by mouth 3 (three) times daily., Disp: 90 capsule, Rfl: 3   acetaminophen (TYLENOL) 500 MG tablet, Take 500 mg by mouth every 6 (six) hours as needed for moderate pain., Disp: , Rfl:    aspirin EC 81 MG tablet, Take 81 mg by mouth daily. Swallow whole., Disp: , Rfl:    Continuous Blood Gluc Sensor (FREESTYLE LIBRE 2 SENSOR) MISC, 1 each by Does not apply route PRO., Disp: 1 each, Rfl: 4   Dulaglutide (TRULICITY) 3 MG/0.5ML SOPN, Inject 3 mg into the skin once a week., Disp: 2 mL, Rfl: 11   ibuprofen (ADVIL) 200 MG tablet, Take 200-800 mg by mouth every 6 (six) hours as needed for moderate pain., Disp: , Rfl:    ibuprofen (ADVIL) 800 MG tablet, Take 1 tablet (800 mg total) by mouth  every 6 (six) hours as needed., Disp: 60 tablet, Rfl: 1   insulin glargine, 1 Unit Dial, (TOUJEO SOLOSTAR) 300 UNIT/ML Solostar Pen, Inject 42 Units into the skin daily in the afternoon. (Patient taking differently: Inject 64 Units into the skin daily in the afternoon.), Disp: 1.5 mL, Rfl: 1   insulin glargine, 1 Unit Dial, (TOUJEO) 300 UNIT/ML Solostar Pen, Inject 68 Units into the skin daily.  Prime pen with 3 units before each use., Disp: 21 mL, Rfl: 99   insulin lispro (HUMALOG KWIKPEN) 100 UNIT/ML KwikPen, Inject 4 Units into the skin up to 3 (three) times daily before meals., Disp: 15 mL, Rfl: 4   Insulin Pen Needle (PEN NEEDLES 3/16") 31G X 5 MM MISC, 100 each by Does not apply route 4 (four) times daily., Disp: 100 each, Rfl: 3   Insulin Pen Needle (TECHLITE PEN NEEDLES) 31G X 5 MM MISC, Use 4 times daily, Disp: 400 each, Rfl: 4   losartan (COZAAR) 50 MG tablet, Take 1 tablet (50 mg total) by mouth daily., Disp: 90 tablet, Rfl: 0   Magnesium 100 MG CAPS, Take 100 mg by mouth daily., Disp: , Rfl:    ondansetron (ZOFRAN-ODT) 4 MG disintegrating tablet, Take 1 tablet (4 mg total) by mouth every 8 (eight) hours as needed  for nausea or vomiting., Disp: 20 tablet, Rfl: 0   oxyCODONE-acetaminophen (PERCOCET) 5-325 MG tablet, Take 1 tablet by mouth every 4 (four) hours as needed for severe pain., Disp: 30 tablet, Rfl: 0   phenazopyridine (PYRIDIUM) 200 MG tablet, Take 1 tablet (200 mg total) by mouth 3 (three) times daily., Disp: 6 tablet, Rfl: 0   Probiotic Product (PROBIOTIC BLEND PO), Take 1 tablet by mouth daily., Disp: , Rfl:    sulfamethoxazole-trimethoprim (BACTRIM DS) 800-160 MG tablet, Take 1 tablet by mouth 2 (two) times daily., Disp: 28 tablet, Rfl: 0   TRULICITY 3 MG/0.5ML SOPN, Inject 3 mg into the skin once a week., Disp: , Rfl:    Turmeric 500 MG CAPS, Take 500 mg by mouth daily., Disp: , Rfl:    vitamin C (ASCORBIC ACID) 250 MG tablet, Take 250 mg by mouth daily., Disp: , Rfl:    zinc  gluconate 50 MG tablet, Take 50 mg by mouth daily., Disp: , Rfl:   Social History   Tobacco Use  Smoking Status Former   Current packs/day: 0.00   Types: Cigarettes   Start date: 12/12/1978   Quit date: 12/12/1983   Years since quitting: 39.5  Smokeless Tobacco Never    Allergies  Allergen Reactions   Codeine     REACTION: Nausea   Demerol Nausea And Vomiting   Other Other (See Comments)    Narcotics- drops her blood pressure   Shellfish Allergy Swelling   Objective:  There were no vitals filed for this visit. There is no height or weight on file to calculate BMI. Constitutional Well developed. Well nourished.  Vascular Dorsalis pedis pulses palpable bilaterally. Posterior tibial pulses palpable bilaterally. Capillary refill normal to all digits.  No cyanosis or clubbing noted. Pedal hair growth normal.  Neurologic Normal speech. Oriented to person, place, and time. Epicritic sensation to light touch grossly present bilaterally.  Dermatologic Nails well groomed and normal in appearance. No open wounds. No skin lesions.  Orthopedic: Right fourth digit hammertoe contracture noted with predislocation syndrome at the metatarsophalangeal joint.  Preulcerative callus/lesion noted to the third digit due to pressure.  Contracture noted to the third digit semiflexible hammertoe contracture.   Radiographs: 3 views of skeletally mature adult right foot: Hammertoe contracture noted of the third digit.  Predislocation syndrome noted of the fourth toe on the metatarsophalangeal joint.  No signs of osteomyelitis noted.  Generalized osteopenia noted. Assessment:   1. Predislocation syndrome of metatarsophalangeal joint, right   2. Hammertoe of right foot    Plan:  Patient was evaluated and treated and all questions answered.  Right fourth digit predislocation syndrome and right third semiflexible hammertoe contracture -All questions and concerns were discussed with the patient in  extensive detail -Given the nature of the predislocation syndrome and her previous foot deformity patient would likely benefit from elective amputation of the toe to decrease the pressure on the third digit.  She is a high risk of losing all toes if she loses multiple digits.  I discussed this with patient she states understanding for now she would like to salvage the toe possible.  She would rather undergo elective amputation of the fourth digit to take the pressure away from the third toe.  She will also benefit from third digit flexor contracture to decrease the contracture of the third digit.  I discussed my preoperative intra or postoperative plan with the patient in extensive detail she states understanding like to proceed with surgery -Informed surgical risk  consent was reviewed and read aloud to the patient.  I reviewed the films.  I have discussed my findings with the patient in great detail.  I have discussed all risks including but not limited to infection, stiffness, scarring, limp, disability, deformity, damage to blood vessels and nerves, numbness, poor healing, need for braces, arthritis, chronic pain, amputation, death.  All benefits and realistic expectations discussed in great detail.  I have made no promises as to the outcome.  I have provided realistic expectations.  I have offered the patient a 2nd opinion, which they have declined and assured me they preferred to proceed despite the risks   No follow-ups on file.   Right fourth digit toe amputation elective.  Right fourth toe is pressing against the third toe and preulcerative  Right third digit toe contracture flexor release.

## 2023-06-18 ENCOUNTER — Telehealth: Payer: Self-pay | Admitting: Podiatry

## 2023-06-18 NOTE — Telephone Encounter (Signed)
DOS-07/14/2023  TENOTOMY SINGLE 3RD RT-28010 AMPUTATION TOE MPJ JOINT 4TH [id number].  [employer] EFFECTIVE DATE- 09/02/2022  DEDUCTIBLE- $500.00 WITH REMAINING $0.00 OOP- $7900.00 WITH REMAINING $1,774.62    PER [employer]'S AUTOMATED SYSTEM, PRIOR AUTH IS NOT REQUIRED FOR CPT CODES 45409 AND [id number].  CALL REF NUMBER: [phone]

## 2023-07-03 ENCOUNTER — Other Ambulatory Visit (HOSPITAL_COMMUNITY): Payer: Self-pay

## 2023-07-03 ENCOUNTER — Other Ambulatory Visit: Payer: Self-pay

## 2023-07-04 ENCOUNTER — Other Ambulatory Visit: Payer: Self-pay

## 2023-07-04 ENCOUNTER — Other Ambulatory Visit (HOSPITAL_COMMUNITY): Payer: Self-pay

## 2023-07-07 ENCOUNTER — Other Ambulatory Visit (HOSPITAL_COMMUNITY): Payer: Self-pay

## 2023-07-08 ENCOUNTER — Other Ambulatory Visit: Payer: Self-pay | Admitting: Podiatry

## 2023-07-08 ENCOUNTER — Encounter: Payer: Self-pay | Admitting: Podiatry

## 2023-07-08 MED ORDER — DOXYCYCLINE HYCLATE 100 MG PO TABS: 100.0000 mg | ORAL_TABLET | Freq: Two times a day (BID) | ORAL | 0 refills | Status: AC

## 2023-07-09 ENCOUNTER — Other Ambulatory Visit (HOSPITAL_COMMUNITY): Payer: Self-pay

## 2023-07-14 ENCOUNTER — Other Ambulatory Visit: Payer: Self-pay | Admitting: Podiatry

## 2023-07-14 DIAGNOSIS — M24477 Recurrent dislocation, right toe(s): Secondary | ICD-10-CM | POA: Diagnosis not present

## 2023-07-14 DIAGNOSIS — M25871 Other specified joint disorders, right ankle and foot: Secondary | ICD-10-CM | POA: Diagnosis not present

## 2023-07-14 DIAGNOSIS — M2041 Other hammer toe(s) (acquired), right foot: Secondary | ICD-10-CM | POA: Diagnosis not present

## 2023-07-14 DIAGNOSIS — S91301A Unspecified open wound, right foot, initial encounter: Secondary | ICD-10-CM | POA: Diagnosis not present

## 2023-07-14 DIAGNOSIS — M24574 Contracture, right foot: Secondary | ICD-10-CM | POA: Diagnosis not present

## 2023-07-14 MED ORDER — OXYCODONE-ACETAMINOPHEN 5-325 MG PO TABS: 1.0000 | ORAL_TABLET | ORAL | 0 refills | Status: AC | PRN

## 2023-07-23 ENCOUNTER — Ambulatory Visit (INDEPENDENT_AMBULATORY_CARE_PROVIDER_SITE_OTHER): Payer: Commercial Managed Care - PPO

## 2023-07-23 ENCOUNTER — Ambulatory Visit (INDEPENDENT_AMBULATORY_CARE_PROVIDER_SITE_OTHER): Payer: Commercial Managed Care - PPO | Admitting: Podiatry

## 2023-07-23 DIAGNOSIS — M25871 Other specified joint disorders, right ankle and foot: Secondary | ICD-10-CM

## 2023-07-23 DIAGNOSIS — Z9889 Other specified postprocedural states: Secondary | ICD-10-CM

## 2023-07-23 NOTE — Progress Notes (Signed)
Subjective:  Patient ID: Courtney Parks, female    DOB: [birth year],  MRN: [id number]  Chief Complaint  Patient presents with   Routine Post Op    RM#12 POV#1 right foot no pain patient states doing well no signs of infection or swelling.    DOS: 07/14/2023 Procedure: Right fourth digit amputation right third digit flexor tenotomy  62 y.o. female returns for post-op check.  She states she is doing well.  Denies any other acute complaints.  Bandages clean dry and intact  Review of Systems: Negative except as noted in the HPI. Denies N/V/F/Ch.  Past Medical History:  Diagnosis Date   Anxiety    Depression    Diabetes mellitus, type II (HCC)    Hyperlipidemia    Hypertension    Kidney stones     Current Outpatient Medications:    aspirin EC 81 MG tablet, Take 81 mg by mouth daily. Swallow whole., Disp: , Rfl:    Continuous Blood Gluc Sensor (FREESTYLE LIBRE 2 SENSOR) MISC, 1 each by Does not apply route PRO., Disp: 1 each, Rfl: 4   doxycycline (VIBRA-TABS) 100 MG tablet, Take 1 tablet (100 mg total) by mouth 2 (two) times daily., Disp: 20 tablet, Rfl: 0   gabapentin (NEURONTIN) 300 MG capsule, Take 1 capsule (300 mg total) by mouth 3 (three) times daily., Disp: 90 capsule, Rfl: 3   insulin glargine, 1 Unit Dial, (TOUJEO SOLOSTAR) 300 UNIT/ML Solostar Pen, Inject 42 Units into the skin daily in the afternoon. (Patient taking differently: Inject 64 Units into the skin daily in the afternoon.), Disp: 1.5 mL, Rfl: 1   insulin glargine, 1 Unit Dial, (TOUJEO) 300 UNIT/ML Solostar Pen, Inject 68 Units into the skin daily.  Prime pen with 3 units before each use., Disp: 21 mL, Rfl: 99   insulin lispro (HUMALOG KWIKPEN) 100 UNIT/ML KwikPen, Inject 4 Units into the skin up to 3 (three) times daily before meals., Disp: 15 mL, Rfl: 4   Insulin Pen Needle (PEN NEEDLES 3/16") 31G X 5 MM MISC, 100 each by Does not apply route 4 (four) times daily., Disp: 100 each, Rfl: 3   Insulin Pen  Needle (TECHLITE PEN NEEDLES) 31G X 5 MM MISC, Use 4 times daily, Disp: 400 each, Rfl: 4   losartan (COZAAR) 50 MG tablet, Take 1 tablet (50 mg total) by mouth daily., Disp: 90 tablet, Rfl: 0   Magnesium 100 MG CAPS, Take 100 mg by mouth daily., Disp: , Rfl:    ondansetron (ZOFRAN-ODT) 4 MG disintegrating tablet, Take 1 tablet (4 mg total) by mouth every 8 (eight) hours as needed for nausea or vomiting., Disp: 20 tablet, Rfl: 0   oxyCODONE-acetaminophen (PERCOCET) 5-325 MG tablet, Take 1 tablet by mouth every 4 (four) hours as needed for severe pain., Disp: 30 tablet, Rfl: 0   oxyCODONE-acetaminophen (PERCOCET) 5-325 MG tablet, Take 1 tablet by mouth every 4 (four) hours as needed for severe pain (pain score 7-10)., Disp: 30 tablet, Rfl: 0   Probiotic Product (PROBIOTIC BLEND PO), Take 1 tablet by mouth daily., Disp: , Rfl:    zinc gluconate 50 MG tablet, Take 50 mg by mouth daily., Disp: , Rfl:    acetaminophen (TYLENOL) 500 MG tablet, Take 500 mg by mouth every 6 (six) hours as needed for moderate pain., Disp: , Rfl:    Dulaglutide (TRULICITY) 3 MG/0.5ML SOPN, Inject 3 mg into the skin once a week., Disp: 2 mL, Rfl: 11   ibuprofen (ADVIL) 200 MG tablet,  Take 200-800 mg by mouth every 6 (six) hours as needed for moderate pain., Disp: , Rfl:    ibuprofen (ADVIL) 800 MG tablet, Take 1 tablet (800 mg total) by mouth every 6 (six) hours as needed., Disp: 60 tablet, Rfl: 1   phenazopyridine (PYRIDIUM) 200 MG tablet, Take 1 tablet (200 mg total) by mouth 3 (three) times daily., Disp: 6 tablet, Rfl: 0   sulfamethoxazole-trimethoprim (BACTRIM DS) 800-160 MG tablet, Take 1 tablet by mouth 2 (two) times daily., Disp: 28 tablet, Rfl: 0   TRULICITY 3 MG/0.5ML SOPN, Inject 3 mg into the skin once a week., Disp: , Rfl:    Turmeric 500 MG CAPS, Take 500 mg by mouth daily., Disp: , Rfl:    vitamin C (ASCORBIC ACID) 250 MG tablet, Take 250 mg by mouth daily., Disp: , Rfl:   Social History   Tobacco Use  Smoking  Status Former   Current packs/day: 0.00   Types: Cigarettes   Start date: 12/12/1978   Quit date: 12/12/1983   Years since quitting: 39.6  Smokeless Tobacco Never    Allergies  Allergen Reactions   Codeine     REACTION: Nausea   Demerol Nausea And Vomiting   Other Other (See Comments)    Narcotics- drops her blood pressure   Shellfish Allergy Swelling   Objective:  There were no vitals filed for this visit. There is no height or weight on file to calculate BMI. Constitutional Well developed. Well nourished.  Vascular Foot warm and well perfused. Capillary refill normal to all digits.   Neurologic Normal speech. Oriented to person, place, and time. Epicritic sensation to light touch grossly present bilaterally.  Dermatologic Skin healing well without signs of infection. Skin edges well coapted without signs of infection.  Orthopedic: Tenderness to palpation noted about the surgical site.   Radiographs: 3 views skeletally mature right foot: Fourth digit amputation noted.  No other abnormalities identified Assessment:   1. Status post surgery    Plan:  Patient was evaluated and treated and all questions answered.  S/p foot surgery right -Progressing as expected post-operatively. -XR: See above -WB Status: Weightbearing as tolerated in darco wedge shoe -Sutures: Intact.  No clinical signs of dehiscence noted no complication noted. -Medications: None -Foot redressed.  No follow-ups on file.

## 2023-07-24 DIAGNOSIS — H6991 Unspecified Eustachian tube disorder, right ear: Secondary | ICD-10-CM | POA: Diagnosis not present

## 2023-07-30 ENCOUNTER — Other Ambulatory Visit: Payer: Self-pay

## 2023-07-30 ENCOUNTER — Other Ambulatory Visit (HOSPITAL_COMMUNITY): Payer: Self-pay

## 2023-08-04 ENCOUNTER — Other Ambulatory Visit (HOSPITAL_COMMUNITY): Payer: Self-pay

## 2023-08-05 ENCOUNTER — Ambulatory Visit (INDEPENDENT_AMBULATORY_CARE_PROVIDER_SITE_OTHER): Payer: Commercial Managed Care - PPO | Admitting: Podiatry

## 2023-08-05 ENCOUNTER — Encounter: Payer: Self-pay | Admitting: Podiatry

## 2023-08-05 VITALS — Ht 67.0 in | Wt 179.0 lb

## 2023-08-05 DIAGNOSIS — J320 Chronic maxillary sinusitis: Secondary | ICD-10-CM | POA: Diagnosis not present

## 2023-08-05 DIAGNOSIS — Z6833 Body mass index (BMI) 33.0-33.9, adult: Secondary | ICD-10-CM | POA: Diagnosis not present

## 2023-08-05 DIAGNOSIS — Z9889 Other specified postprocedural states: Secondary | ICD-10-CM

## 2023-08-05 NOTE — Progress Notes (Unsigned)
Subjective:  Patient ID: Courtney Parks, female    DOB: [birth year],  MRN: [id number]  Chief Complaint  Patient presents with   Routine Post Op    Pt is here for post op visit 2     DOS: 07/14/2023 Procedure: Right fourth digit amputation right third digit flexor tenotomy  62 y.o. female returns for post-op check.  She states she is doing well.  Denies any other acute complaints.  Bandages clean dry and intact  Review of Systems: Negative except as noted in the HPI. Denies N/V/F/Ch.  Past Medical History:  Diagnosis Date   Anxiety    Depression    Diabetes mellitus, type II (HCC)    Hyperlipidemia    Hypertension    Kidney stones     Current Outpatient Medications:    acetaminophen (TYLENOL) 500 MG tablet, Take 500 mg by mouth every 6 (six) hours as needed for moderate pain., Disp: , Rfl:    aspirin EC 81 MG tablet, Take 81 mg by mouth daily. Swallow whole., Disp: , Rfl:    Continuous Blood Gluc Sensor (FREESTYLE LIBRE 2 SENSOR) MISC, 1 each by Does not apply route PRO., Disp: 1 each, Rfl: 4   doxycycline (VIBRA-TABS) 100 MG tablet, Take 1 tablet (100 mg total) by mouth 2 (two) times daily., Disp: 20 tablet, Rfl: 0   Dulaglutide (TRULICITY) 3 MG/0.5ML SOPN, Inject 3 mg into the skin once a week., Disp: 2 mL, Rfl: 11   gabapentin (NEURONTIN) 300 MG capsule, Take 1 capsule (300 mg total) by mouth 3 (three) times daily., Disp: 90 capsule, Rfl: 3   ibuprofen (ADVIL) 200 MG tablet, Take 200-800 mg by mouth every 6 (six) hours as needed for moderate pain., Disp: , Rfl:    ibuprofen (ADVIL) 800 MG tablet, Take 1 tablet (800 mg total) by mouth every 6 (six) hours as needed., Disp: 60 tablet, Rfl: 1   insulin glargine, 1 Unit Dial, (TOUJEO SOLOSTAR) 300 UNIT/ML Solostar Pen, Inject 42 Units into the skin daily in the afternoon. (Patient taking differently: Inject 64 Units into the skin daily in the afternoon.), Disp: 1.5 mL, Rfl: 1   insulin glargine, 1 Unit Dial, (TOUJEO) 300  UNIT/ML Solostar Pen, Inject 68 Units into the skin daily.  Prime pen with 3 units before each use., Disp: 21 mL, Rfl: 99   insulin lispro (HUMALOG KWIKPEN) 100 UNIT/ML KwikPen, Inject 4 Units into the skin up to 3 (three) times daily before meals., Disp: 15 mL, Rfl: 4   Insulin Pen Needle (PEN NEEDLES 3/16") 31G X 5 MM MISC, 100 each by Does not apply route 4 (four) times daily., Disp: 100 each, Rfl: 3   Insulin Pen Needle (TECHLITE PEN NEEDLES) 31G X 5 MM MISC, Use 4 times daily, Disp: 400 each, Rfl: 4   losartan (COZAAR) 50 MG tablet, Take 1 tablet (50 mg total) by mouth daily., Disp: 90 tablet, Rfl: 0   Magnesium 100 MG CAPS, Take 100 mg by mouth daily., Disp: , Rfl:    ondansetron (ZOFRAN-ODT) 4 MG disintegrating tablet, Take 1 tablet (4 mg total) by mouth every 8 (eight) hours as needed for nausea or vomiting., Disp: 20 tablet, Rfl: 0   oxyCODONE-acetaminophen (PERCOCET) 5-325 MG tablet, Take 1 tablet by mouth every 4 (four) hours as needed for severe pain., Disp: 30 tablet, Rfl: 0   oxyCODONE-acetaminophen (PERCOCET) 5-325 MG tablet, Take 1 tablet by mouth every 4 (four) hours as needed for severe pain (pain score 7-10)., Disp: 30  tablet, Rfl: 0   phenazopyridine (PYRIDIUM) 200 MG tablet, Take 1 tablet (200 mg total) by mouth 3 (three) times daily., Disp: 6 tablet, Rfl: 0   Probiotic Product (PROBIOTIC BLEND PO), Take 1 tablet by mouth daily., Disp: , Rfl:    sulfamethoxazole-trimethoprim (BACTRIM DS) 800-160 MG tablet, Take 1 tablet by mouth 2 (two) times daily., Disp: 28 tablet, Rfl: 0   TRULICITY 3 MG/0.5ML SOPN, Inject 3 mg into the skin once a week., Disp: , Rfl:    Turmeric 500 MG CAPS, Take 500 mg by mouth daily., Disp: , Rfl:    vitamin C (ASCORBIC ACID) 250 MG tablet, Take 250 mg by mouth daily., Disp: , Rfl:    zinc gluconate 50 MG tablet, Take 50 mg by mouth daily., Disp: , Rfl:   Social History   Tobacco Use  Smoking Status Former   Current packs/day: 0.00   Types: Cigarettes    Start date: 12/12/1978   Quit date: 12/12/1983   Years since quitting: 39.6  Smokeless Tobacco Never    Allergies  Allergen Reactions   Codeine     REACTION: Nausea   Demerol Nausea And Vomiting   Other Other (See Comments)    Narcotics- drops her blood pressure   Shellfish Allergy Swelling   Objective:  There were no vitals filed for this visit. Body mass index is 28.04 kg/m. Constitutional Well developed. Well nourished.  Vascular Foot warm and well perfused. Capillary refill normal to all digits.   Neurologic Normal speech. Oriented to person, place, and time. Epicritic sensation to light touch grossly present bilaterally.  Dermatologic Is patient site completely reepithelialized.  No signs of dehiscence noted no complication noted.  Orthopedic: Tenderness to palpation noted about the surgical site.   Radiographs: 3 views skeletally mature right foot: Fourth digit amputation noted.  No other abnormalities identified Assessment:   1. Status post surgery     Plan:  Patient was evaluated and treated and all questions answered.  S/p foot surgery right -Clinically healed and officially discharged from my care if any foot and ankle issues on future she will come back and see me.  She is a high risk of developing ulcerations.  I encouraged her to control her sugars as well as discussed shoe gear modification she states understanding No follow-ups on file.

## 2023-08-06 ENCOUNTER — Encounter: Payer: Commercial Managed Care - PPO | Admitting: Podiatry

## 2023-09-01 ENCOUNTER — Other Ambulatory Visit: Payer: Self-pay

## 2023-09-01 ENCOUNTER — Other Ambulatory Visit (HOSPITAL_COMMUNITY): Payer: Self-pay

## 2023-09-02 ENCOUNTER — Other Ambulatory Visit (HOSPITAL_COMMUNITY): Payer: Self-pay

## 2023-09-02 MED ORDER — LOSARTAN POTASSIUM 50 MG PO TABS
50.0000 mg | ORAL_TABLET | Freq: Every day | ORAL | 0 refills | Status: DC
  Filled 2023-09-02: qty 90, 90d supply, fill #0

## 2023-09-04 ENCOUNTER — Other Ambulatory Visit (HOSPITAL_COMMUNITY): Payer: Self-pay

## 2023-09-05 ENCOUNTER — Other Ambulatory Visit (HOSPITAL_COMMUNITY): Payer: Self-pay

## 2023-09-26 DIAGNOSIS — R790 Abnormal level of blood mineral: Secondary | ICD-10-CM | POA: Diagnosis not present

## 2023-09-26 DIAGNOSIS — E559 Vitamin D deficiency, unspecified: Secondary | ICD-10-CM | POA: Diagnosis not present

## 2023-09-26 DIAGNOSIS — E039 Hypothyroidism, unspecified: Secondary | ICD-10-CM | POA: Diagnosis not present

## 2023-09-26 DIAGNOSIS — R7989 Other specified abnormal findings of blood chemistry: Secondary | ICD-10-CM | POA: Diagnosis not present

## 2023-09-26 DIAGNOSIS — E119 Type 2 diabetes mellitus without complications: Secondary | ICD-10-CM | POA: Diagnosis not present

## 2023-09-26 DIAGNOSIS — E538 Deficiency of other specified B group vitamins: Secondary | ICD-10-CM | POA: Diagnosis not present

## 2023-09-26 DIAGNOSIS — N951 Menopausal and female climacteric states: Secondary | ICD-10-CM | POA: Diagnosis not present

## 2023-09-26 DIAGNOSIS — R5383 Other fatigue: Secondary | ICD-10-CM | POA: Diagnosis not present

## 2023-09-26 DIAGNOSIS — M2550 Pain in unspecified joint: Secondary | ICD-10-CM | POA: Diagnosis not present

## 2023-10-06 ENCOUNTER — Other Ambulatory Visit (HOSPITAL_COMMUNITY): Payer: Self-pay

## 2023-10-06 MED ORDER — FREESTYLE LIBRE 3 PLUS SENSOR MISC
12 refills | Status: AC
  Filled 2023-10-06: qty 2, 30d supply, fill #0
  Filled 2023-10-28 (×2): qty 2, 30d supply, fill #1
  Filled 2023-12-16: qty 2, 30d supply, fill #2
  Filled 2024-01-05 – 2024-01-09 (×2): qty 2, 30d supply, fill #3
  Filled 2024-02-10: qty 2, 30d supply, fill #4
  Filled 2024-03-11: qty 2, 30d supply, fill #5
  Filled 2024-04-08: qty 2, 30d supply, fill #6
  Filled 2024-05-09: qty 2, 30d supply, fill #7
  Filled 2024-06-08: qty 2, 30d supply, fill #8
  Filled 2024-07-19: qty 2, 30d supply, fill #9
  Filled 2024-08-15: qty 2, 30d supply, fill #10
  Filled 2024-09-16: qty 2, 30d supply, fill #11

## 2023-10-28 ENCOUNTER — Other Ambulatory Visit (HOSPITAL_COMMUNITY): Payer: Self-pay

## 2023-10-28 ENCOUNTER — Other Ambulatory Visit: Payer: Self-pay

## 2023-10-28 MED ORDER — TOUJEO SOLOSTAR 300 UNIT/ML ~~LOC~~ SOPN
PEN_INJECTOR | Freq: Every day | SUBCUTANEOUS | 3 refills | Status: AC
  Filled 2023-10-28: qty 6, 25d supply, fill #0

## 2023-10-29 ENCOUNTER — Other Ambulatory Visit (HOSPITAL_COMMUNITY): Payer: Self-pay

## 2023-10-29 ENCOUNTER — Other Ambulatory Visit: Payer: Self-pay

## 2023-10-29 DIAGNOSIS — Z8639 Personal history of other endocrine, nutritional and metabolic disease: Secondary | ICD-10-CM | POA: Diagnosis not present

## 2023-10-29 DIAGNOSIS — I10 Essential (primary) hypertension: Secondary | ICD-10-CM | POA: Diagnosis not present

## 2023-10-29 DIAGNOSIS — Z794 Long term (current) use of insulin: Secondary | ICD-10-CM | POA: Diagnosis not present

## 2023-10-29 DIAGNOSIS — E1165 Type 2 diabetes mellitus with hyperglycemia: Secondary | ICD-10-CM | POA: Diagnosis not present

## 2023-10-29 MED ORDER — HUMALOG MIX 50/50 KWIKPEN (50-50) 100 UNIT/ML ~~LOC~~ SUPN
PEN_INJECTOR | SUBCUTANEOUS | 4 refills | Status: DC
  Filled 2023-10-29: qty 60, 90d supply, fill #0
  Filled 2024-01-01 – 2024-01-12 (×3): qty 60, 90d supply, fill #1

## 2023-10-29 MED ORDER — COLESEVELAM HCL 625 MG PO TABS
ORAL_TABLET | ORAL | 4 refills | Status: AC
  Filled 2023-10-29: qty 540, 90d supply, fill #0
  Filled 2024-02-10: qty 540, 90d supply, fill #1
  Filled 2024-05-09: qty 540, 90d supply, fill #2
  Filled 2024-08-06: qty 540, 90d supply, fill #3

## 2023-10-30 ENCOUNTER — Other Ambulatory Visit: Payer: Self-pay

## 2023-10-30 ENCOUNTER — Other Ambulatory Visit (HOSPITAL_COMMUNITY): Payer: Self-pay

## 2023-11-05 ENCOUNTER — Other Ambulatory Visit (HOSPITAL_COMMUNITY): Payer: Self-pay

## 2023-11-10 ENCOUNTER — Other Ambulatory Visit (HOSPITAL_COMMUNITY): Payer: Self-pay

## 2023-12-01 ENCOUNTER — Other Ambulatory Visit (HOSPITAL_COMMUNITY): Payer: Self-pay

## 2023-12-02 ENCOUNTER — Other Ambulatory Visit (HOSPITAL_BASED_OUTPATIENT_CLINIC_OR_DEPARTMENT_OTHER): Payer: Self-pay

## 2023-12-02 ENCOUNTER — Other Ambulatory Visit (HOSPITAL_COMMUNITY): Payer: Self-pay

## 2023-12-02 MED ORDER — LOSARTAN POTASSIUM 50 MG PO TABS
50.0000 mg | ORAL_TABLET | Freq: Every day | ORAL | 0 refills | Status: DC
  Filled 2023-12-02: qty 90, 90d supply, fill #0

## 2023-12-17 ENCOUNTER — Other Ambulatory Visit (HOSPITAL_COMMUNITY): Payer: Self-pay

## 2023-12-31 DIAGNOSIS — R609 Edema, unspecified: Secondary | ICD-10-CM | POA: Diagnosis not present

## 2023-12-31 DIAGNOSIS — I10 Essential (primary) hypertension: Secondary | ICD-10-CM | POA: Diagnosis not present

## 2023-12-31 DIAGNOSIS — Z8639 Personal history of other endocrine, nutritional and metabolic disease: Secondary | ICD-10-CM | POA: Diagnosis not present

## 2023-12-31 DIAGNOSIS — Z794 Long term (current) use of insulin: Secondary | ICD-10-CM | POA: Diagnosis not present

## 2023-12-31 DIAGNOSIS — R0602 Shortness of breath: Secondary | ICD-10-CM | POA: Diagnosis not present

## 2023-12-31 DIAGNOSIS — E1165 Type 2 diabetes mellitus with hyperglycemia: Secondary | ICD-10-CM | POA: Diagnosis not present

## 2024-01-01 ENCOUNTER — Other Ambulatory Visit (HOSPITAL_COMMUNITY): Payer: Self-pay

## 2024-01-06 ENCOUNTER — Other Ambulatory Visit (HOSPITAL_COMMUNITY): Payer: Self-pay

## 2024-01-09 ENCOUNTER — Other Ambulatory Visit (HOSPITAL_COMMUNITY): Payer: Self-pay

## 2024-01-12 ENCOUNTER — Other Ambulatory Visit (HOSPITAL_COMMUNITY): Payer: Self-pay

## 2024-01-12 ENCOUNTER — Other Ambulatory Visit: Payer: Self-pay

## 2024-01-13 ENCOUNTER — Other Ambulatory Visit (HOSPITAL_COMMUNITY): Payer: Self-pay

## 2024-01-13 DIAGNOSIS — R350 Frequency of micturition: Secondary | ICD-10-CM | POA: Diagnosis not present

## 2024-01-13 DIAGNOSIS — R300 Dysuria: Secondary | ICD-10-CM | POA: Diagnosis not present

## 2024-01-13 DIAGNOSIS — N3001 Acute cystitis with hematuria: Secondary | ICD-10-CM | POA: Diagnosis not present

## 2024-01-28 ENCOUNTER — Other Ambulatory Visit: Payer: Self-pay

## 2024-02-13 ENCOUNTER — Other Ambulatory Visit (HOSPITAL_COMMUNITY): Payer: Self-pay

## 2024-02-25 ENCOUNTER — Other Ambulatory Visit (HOSPITAL_COMMUNITY): Payer: Self-pay

## 2024-02-25 MED ORDER — LOSARTAN POTASSIUM 50 MG PO TABS
50.0000 mg | ORAL_TABLET | Freq: Every day | ORAL | 0 refills | Status: DC
  Filled 2024-02-25: qty 90, 90d supply, fill #0

## 2024-03-11 ENCOUNTER — Other Ambulatory Visit: Payer: Self-pay

## 2024-03-11 ENCOUNTER — Other Ambulatory Visit (HOSPITAL_COMMUNITY): Payer: Self-pay

## 2024-03-11 MED ORDER — INSULIN LISPRO (1 UNIT DIAL) 100 UNIT/ML (KWIKPEN)
4.0000 [IU] | PEN_INJECTOR | Freq: Three times a day (TID) | SUBCUTANEOUS | 4 refills | Status: DC
  Filled 2024-03-11: qty 3, 25d supply, fill #0

## 2024-04-02 ENCOUNTER — Other Ambulatory Visit (HOSPITAL_BASED_OUTPATIENT_CLINIC_OR_DEPARTMENT_OTHER): Payer: Self-pay

## 2024-04-02 ENCOUNTER — Other Ambulatory Visit (HOSPITAL_COMMUNITY): Payer: Self-pay

## 2024-04-02 MED ORDER — HUMALOG MIX 50/50 KWIKPEN (50-50) 100 UNIT/ML ~~LOC~~ SUPN
PEN_INJECTOR | SUBCUTANEOUS | 11 refills | Status: AC
  Filled 2024-04-02: qty 60, 30d supply, fill #0

## 2024-04-02 MED ORDER — HUMALOG MIX 50/50 KWIKPEN (50-50) 100 UNIT/ML ~~LOC~~ SUPN
PEN_INJECTOR | SUBCUTANEOUS | 1 refills | Status: DC
  Filled 2024-04-02: qty 60, 100d supply, fill #0
  Filled 2024-04-03: qty 60, 90d supply, fill #0
  Filled 2024-07-19: qty 60, 90d supply, fill #1

## 2024-04-03 ENCOUNTER — Other Ambulatory Visit (HOSPITAL_COMMUNITY): Payer: Self-pay

## 2024-04-05 ENCOUNTER — Other Ambulatory Visit (HOSPITAL_COMMUNITY): Payer: Self-pay

## 2024-04-06 ENCOUNTER — Other Ambulatory Visit (HOSPITAL_COMMUNITY): Payer: Self-pay

## 2024-04-07 ENCOUNTER — Other Ambulatory Visit (HOSPITAL_COMMUNITY): Payer: Self-pay

## 2024-04-08 ENCOUNTER — Ambulatory Visit: Admitting: Internal Medicine

## 2024-04-09 ENCOUNTER — Other Ambulatory Visit (HOSPITAL_COMMUNITY): Payer: Self-pay

## 2024-05-12 DIAGNOSIS — H52203 Unspecified astigmatism, bilateral: Secondary | ICD-10-CM | POA: Diagnosis not present

## 2024-05-12 DIAGNOSIS — E113313 Type 2 diabetes mellitus with moderate nonproliferative diabetic retinopathy with macular edema, bilateral: Secondary | ICD-10-CM | POA: Diagnosis not present

## 2024-05-28 ENCOUNTER — Encounter: Payer: Self-pay | Admitting: Internal Medicine

## 2024-06-03 ENCOUNTER — Other Ambulatory Visit (HOSPITAL_COMMUNITY): Payer: Self-pay

## 2024-06-03 DIAGNOSIS — S0502XA Injury of conjunctiva and corneal abrasion without foreign body, left eye, initial encounter: Secondary | ICD-10-CM | POA: Diagnosis not present

## 2024-06-03 MED ORDER — ERYTHROMYCIN 5 MG/GM OP OINT
1.0000 | TOPICAL_OINTMENT | Freq: Every day | OPHTHALMIC | 0 refills | Status: AC
  Filled 2024-06-03: qty 3.5, 3d supply, fill #0

## 2024-06-03 MED ORDER — MOXIFLOXACIN HCL 0.5 % OP SOLN
1.0000 [drp] | Freq: Four times a day (QID) | OPHTHALMIC | 0 refills | Status: AC
  Filled 2024-06-03: qty 3, 15d supply, fill #0

## 2024-06-03 MED ORDER — LOSARTAN POTASSIUM 50 MG PO TABS
50.0000 mg | ORAL_TABLET | Freq: Every day | ORAL | 0 refills | Status: DC
  Filled 2024-06-03: qty 90, 90d supply, fill #0

## 2024-06-08 ENCOUNTER — Other Ambulatory Visit: Payer: Self-pay

## 2024-06-08 ENCOUNTER — Other Ambulatory Visit (HOSPITAL_COMMUNITY): Payer: Self-pay

## 2024-06-08 MED ORDER — INSULIN PEN NEEDLE 31G X 5 MM MISC
Freq: Four times a day (QID) | 3 refills | Status: AC
  Filled 2024-06-08: qty 400, 90d supply, fill #0
  Filled 2024-09-16: qty 400, 90d supply, fill #1

## 2024-06-14 ENCOUNTER — Other Ambulatory Visit (HOSPITAL_COMMUNITY): Payer: Self-pay

## 2024-06-16 ENCOUNTER — Other Ambulatory Visit (HOSPITAL_COMMUNITY): Payer: Self-pay

## 2024-07-15 ENCOUNTER — Other Ambulatory Visit (HOSPITAL_COMMUNITY): Payer: Self-pay

## 2024-07-19 ENCOUNTER — Other Ambulatory Visit: Payer: Self-pay

## 2024-07-20 ENCOUNTER — Other Ambulatory Visit (HOSPITAL_COMMUNITY): Payer: Self-pay

## 2024-07-26 ENCOUNTER — Other Ambulatory Visit (HOSPITAL_COMMUNITY): Payer: Self-pay

## 2024-08-15 ENCOUNTER — Other Ambulatory Visit (HOSPITAL_COMMUNITY): Payer: Self-pay

## 2024-08-16 ENCOUNTER — Other Ambulatory Visit (HOSPITAL_COMMUNITY): Payer: Self-pay

## 2024-08-16 ENCOUNTER — Other Ambulatory Visit: Payer: Self-pay

## 2024-08-17 ENCOUNTER — Other Ambulatory Visit (HOSPITAL_COMMUNITY): Payer: Self-pay

## 2024-08-17 MED ORDER — HUMALOG MIX 50/50 KWIKPEN (50-50) 100 UNIT/ML ~~LOC~~ SUPN
PEN_INJECTOR | SUBCUTANEOUS | 1 refills | Status: AC
  Filled 2024-08-17 – 2024-08-19 (×2): qty 60, 90d supply, fill #0
  Filled 2024-08-20: qty 57, 89d supply, fill #0
  Filled 2024-08-25 (×2): qty 60, 90d supply, fill #0

## 2024-08-19 ENCOUNTER — Other Ambulatory Visit (HOSPITAL_COMMUNITY): Payer: Self-pay

## 2024-08-20 ENCOUNTER — Other Ambulatory Visit: Payer: Self-pay

## 2024-08-24 ENCOUNTER — Ambulatory Visit: Admitting: Internal Medicine

## 2024-08-25 ENCOUNTER — Other Ambulatory Visit (HOSPITAL_COMMUNITY): Payer: Self-pay

## 2024-08-25 ENCOUNTER — Other Ambulatory Visit: Payer: Self-pay

## 2024-08-27 ENCOUNTER — Other Ambulatory Visit (HOSPITAL_COMMUNITY): Payer: Self-pay

## 2024-08-27 MED ORDER — LOSARTAN POTASSIUM 50 MG PO TABS
50.0000 mg | ORAL_TABLET | Freq: Every day | ORAL | 0 refills | Status: AC
  Filled 2024-08-27: qty 90, 90d supply, fill #0

## 2024-09-08 NOTE — Progress Notes (Signed)
 "  [facility]  Urgent Care Provider Note   Provider at bedside: 6:01 PM  History obtained from the: Patient  HISTORY   PATIENT ID: Courtney Parks is a 64 y.o. female.  CHIEF COMPLAINT: Chief Complaint  Patient presents with   Urinary Tract Infection    Hx of kidney stones, usually gets uti afterwards, burning when peeing. Flank pain thinks she has passed a stone, has felt bad for 3 days.      ALLERGIES: Allergies[1]   PAST MEDICAL HISTORY: PMH - Diabetes mellitus (CMD) Hypertension with goal to be determined Kidney stones  CURRENT MEDICATIONS: Current Medications[2]  ROS  All other symptoms are reviewed and are negative except those listed in HPI   HPI   Courtney Parks is a 64 y.o. female  presents to Urgent care   History of Present Illness Patient with history of kidney stones presenting with urinary symptoms.  History of kidney stones - Since age 5 - Typically followed by UTI - Likely passed a stone two days ago - Followed by flank pain and malaise yesterday  Current symptoms - Dysuria - No nausea or vomiting  Diabetes - Poorly controlled blood sugar  Unable to provide urine sample - Due to urgent need to urinate prior to visit - Despite drinking water, no urge to urinate  PHYSICAL EXAM   Vitals:   09/08/24 1744  BP: (!) 164/70  Pulse: 84  Resp: 20  Temp: 97.6 F (36.4 C)  TempSrc: Tympanic  SpO2: 100%  Weight: 99.3 kg (219 lb)  Height: 1.676 m (5' 6)     Physical Exam Vitals and nursing note reviewed.  Constitutional:      General: She is not in acute distress.    Appearance: Normal appearance. She is not ill-appearing, toxic-appearing or diaphoretic.  HENT:     Head: Normocephalic and atraumatic.     Right Ear: External ear normal.     Left Ear: External ear normal.     Nose: Nose normal.     Mouth/Throat:     Mouth: Mucous membranes are moist.  Cardiovascular:     Rate and Rhythm: Normal  rate.  Pulmonary:     Effort: Pulmonary effort is normal. No respiratory distress.  Abdominal:     General: There is no distension.     Tenderness: There is no abdominal tenderness. There is no right CVA tenderness or left CVA tenderness.  Skin:    General: Skin is warm and dry.     Capillary Refill: Capillary refill takes less than 2 seconds.  Neurological:     Mental Status: She is alert and oriented to person, place, and time.        RESULTS  No results found for this visit on 09/08/24.   ASSESSMENT/PLAN/MDM   1. Dysuria      Courtney Parks is a 64 y.o. female  presents to Urgent care with complaints of urinary frequency and urgency and history of multiple urinary tract infections without fever today on exam patient appears uncomfortable.  No acute signs of dehydration are noted and no tachycardia.  Abdomen is soft nontender nondistended.  No CVAT.  Patient was unable to provide a urine specimen today however given her history it is likely that she has a urinary tract infection we will treat with cefdinir.  Patient will follow-up with her PCP and return for new or worsening symptoms versus going to the ER for more serious symptoms patient states  understanding of this.      UC DISPOSITION   Follow up with PCP  Patient Instructions  You were to provide a urine sample today but knowing your history and the likelihood of an infection today we will prescribe cefdinir.  May have fluids and follow-up with your primary care provider.  Previous susceptibility studies have all shown good susceptibility to multiple antibiotics.  Return for new or worsening symptoms or go to the ER if having symptoms of kidney stone as you have previously.   Hand out provided, I discussed the findings today, diagnosis/differential diagnosis, plan and red flags that require return for reevaluation with PCP,  Urgent care or EMERGENCY. Patient/representitive was agreeable to outlined plan. Questions  were answered and patient is stable for discharge.  Provider time spent in patient care today, inclusive of but not limited to clinical reassessment, review of diagnostic studies, and discharge preparation, was less than 30 minutes.  This document was created using the aid of voice recognition Scientist, clinical (histocompatibility and immunogenetics).  Electrically signed by Channing Sero ENP-C MSN at 6:31 PM        [1] Allergies Allergen Reactions   Codeine Other (See Comments)    REACTION: Nausea   Meperidine GI Intolerance  [2]   insulin  lispro (HumaLOG  KwikPen) 100 unit/mL KwikPen   losartan  (COZAAR ) 50 mg tablet   cefdinir (OMNICEF) 300 mg capsule   dulaglutide  (Trulicity ) 0.75 mg/0.5 mL pnij   fluticasone propionate (FLONASE) 50 mcg/spray nasal spray   hydrocortisone  1 % lotn lotion   levocetirizine (XYZAL) 5 mg tablet   loratadine (CLARITIN) 10 mg tablet   lutein  20 mg cap   metoprolol  succinate (TOPROL  XL) 100 mg 24 hr tablet   multivitamin cap   ondansetron  (ZOFRAN -ODT) 4 mg disintegrating tablet "

## 2024-09-23 ENCOUNTER — Other Ambulatory Visit (HOSPITAL_COMMUNITY): Payer: Self-pay

## 2024-09-23 MED ORDER — AMPHETAMINE-DEXTROAMPHET ER 10 MG PO CP24
10.0000 mg | ORAL_CAPSULE | ORAL | 0 refills | Status: DC
  Filled 2024-09-23: qty 30, 30d supply, fill #0

## 2024-09-24 ENCOUNTER — Emergency Department (HOSPITAL_COMMUNITY)

## 2024-09-24 ENCOUNTER — Emergency Department (HOSPITAL_COMMUNITY)
Admission: EM | Admit: 2024-09-24 | Discharge: 2024-09-24 | Disposition: A | Discharge status: Home / Self Care | Attending: Emergency Medicine | Admitting: Emergency Medicine

## 2024-09-24 DIAGNOSIS — F439 Reaction to severe stress, unspecified: Secondary | ICD-10-CM

## 2024-09-24 DIAGNOSIS — R030 Elevated blood-pressure reading, without diagnosis of hypertension: Secondary | ICD-10-CM

## 2024-09-24 DIAGNOSIS — Z7982 Long term (current) use of aspirin: Secondary | ICD-10-CM | POA: Insufficient documentation

## 2024-09-24 DIAGNOSIS — Z733 Stress, not elsewhere classified: Secondary | ICD-10-CM | POA: Insufficient documentation

## 2024-09-24 DIAGNOSIS — Z79899 Other long term (current) drug therapy: Secondary | ICD-10-CM | POA: Insufficient documentation

## 2024-09-24 DIAGNOSIS — Z794 Long term (current) use of insulin: Secondary | ICD-10-CM | POA: Insufficient documentation

## 2024-09-24 DIAGNOSIS — R12 Heartburn: Secondary | ICD-10-CM

## 2024-09-24 DIAGNOSIS — R072 Precordial pain: Secondary | ICD-10-CM

## 2024-09-24 DIAGNOSIS — I10 Essential (primary) hypertension: Secondary | ICD-10-CM | POA: Insufficient documentation

## 2024-09-24 LAB — BASIC METABOLIC PANEL WITH GFR
Anion gap: 12 (ref 5–15)
BUN: 13 mg/dL (ref 8–23)
CO2: 25 mmol/L (ref 22–32)
Calcium: 9.9 mg/dL (ref 8.9–10.3)
Chloride: 103 mmol/L (ref 98–111)
Creatinine, Ser: 0.53 mg/dL (ref 0.44–1.00)
GFR, Estimated: >60 mL/min
Glucose, Bld: 128 mg/dL — ABNORMAL HIGH (ref 70–99)
Potassium: 4.1 mmol/L (ref 3.5–5.1)
Sodium: 139 mmol/L (ref 135–145)

## 2024-09-24 LAB — CBC
HCT: 44.2 % (ref 36.0–46.0)
Hemoglobin: 14.5 g/dL (ref 12.0–15.0)
MCH: 29.7 pg (ref 26.0–34.0)
MCHC: 32.8 g/dL (ref 30.0–36.0)
MCV: 90.6 fL (ref 80.0–100.0)
Platelets: 235 K/uL (ref 150–400)
RBC: 4.88 MIL/uL (ref 3.87–5.11)
RDW: 13.2 % (ref 11.5–15.5)
WBC: 7.7 K/uL (ref 4.0–10.5)
nRBC: 0 % (ref 0.0–0.2)

## 2024-09-24 LAB — TROPONIN T, HIGH SENSITIVITY
Troponin T High Sensitivity: 10 ng/L (ref 0–19)
Troponin T High Sensitivity: 10 ng/L (ref 0–19)

## 2024-09-24 MED ORDER — ALUM & MAG HYDROXIDE-SIMETH 200-200-20 MG/5ML PO SUSP
30.0000 mL | Freq: Once | ORAL | Status: AC
  Administered 2024-09-24: 30 mL via ORAL
  Filled 2024-09-24: qty 30

## 2024-09-24 MED ORDER — FAMOTIDINE 20 MG PO TABS
20.0000 mg | ORAL_TABLET | Freq: Once | ORAL | Status: AC
  Administered 2024-09-24: 20 mg via ORAL
  Filled 2024-09-24: qty 1

## 2024-09-24 MED ORDER — ACETAMINOPHEN 500 MG PO TABS
1000.0000 mg | ORAL_TABLET | Freq: Once | ORAL | Status: AC
  Administered 2024-09-24: 1000 mg via ORAL
  Filled 2024-09-24: qty 2

## 2024-09-24 NOTE — ED Triage Notes (Signed)
 Pt BIB EMS due to chest pain starting at 0820 this morning pt report taking 325mg  aspirin  at 0821. Chest pain radiates to L arm - pain described as crushing chest pain - constant. C/o dizziness. Denies blurred vision, H/A  Hx. UTI finished antibiotics yesterday.

## 2024-09-24 NOTE — Discharge Instructions (Addendum)
 It was our pleasure to provide your ER care today - we hope that you feel better.  If GI/reflux symptoms, try taking pepcid and maalox as need for symptom relief.   For recent chest pain, follow up closely with cardiologist in the next 1-2 weeks - we made referral, and they should be contacting you with an appointment in the next few days.   Return to ER right away if worse, new symptoms, fevers, recurrent/persistent chest pain, increased trouble breathing, or other concern.

## 2024-09-24 NOTE — ED Notes (Signed)
 US  PIV placed, R forearm, 20g 1.88

## 2024-09-24 NOTE — ED Provider Notes (Signed)
 " [place] EMERGENCY DEPARTMENT AT [facility] Provider Note   CSN: [id number] Arrival date & time: 09/24/24  9094     Patient presents with: Chest Pain   Courtney Parks is a 64 y.o. female.   Pt c/o mid to left chest discomfort at rest, sitting at desk at work this AM. Nerissa as if heaviness, and also had sense of palpitations. Mild nausea. No vomiting. No diaphoresis or sob. No pleuritic pain. Symptoms occurred at rest. No exertional chest pain or discomfort. No unusual sob or doe. No other recent chest pain or discomfort, even w activity or exertion. Does note significant stress/providing care to others/family. +recent heartburn/indigestion. Denies new or worsening cough or uri symptoms. No fever or chills. No chest wall injury or strain. No hx cad. Parent with heart issues in older age. No new leg pain or swelling. No hx dvt or pe.   The history is provided by the patient, medical records and the EMS personnel.  Chest Pain Associated symptoms: nausea and palpitations   Associated symptoms: no abdominal pain, no back pain, no cough, no fever, no headache, no shortness of breath and no vomiting        Prior to Admission medications  Medication Sig Start Date End Date Taking? Authorizing Provider  acetaminophen  (TYLENOL ) 500 MG tablet Take 500 mg by mouth every 6 (six) hours as needed for moderate pain.    [provider]  amphetamine -dextroamphetamine  (ADDERALL XR) 10 MG 24 hr capsule Take 1 capsule (10 mg total) by mouth every morning. 09/23/24     aspirin  EC 81 MG tablet Take 81 mg by mouth daily. Swallow whole.    [provider]  colesevelam  (WELCHOL ) 625 MG tablet Take 3 tablets by mouth with meals twice a day 10/29/23     Continuous Blood Gluc Sensor (FREESTYLE LIBRE 2 SENSOR) MISC 1 each by Does not apply route PRO. 04/05/22   Amin, Ankit C, MD  Continuous Glucose Sensor (FREESTYLE LIBRE 3 PLUS SENSOR) MISC Apply sensor every 15 days as  directed. 10/06/23   Faythe Purchase, MD  doxycycline  (VIBRA -TABS) 100 MG tablet Take 1 tablet (100 mg total) by mouth 2 (two) times daily. 07/08/23   Tobie Franky SQUIBB, DPM  Dulaglutide  (TRULICITY ) 3 MG/0.5ML SOPN Inject 3 mg into the skin once a week. 10/28/22     erythromycin  ophthalmic ointment Place 1 Application into the lower left eye at bedtime for 1 week as directed. 06/03/24     gabapentin  (NEURONTIN ) 300 MG capsule Take 1 capsule (300 mg total) by mouth 3 (three) times daily. 10/21/22   Tobie Franky SQUIBB, DPM  ibuprofen  (ADVIL ) 200 MG tablet Take 200-800 mg by mouth every 6 (six) hours as needed for moderate pain.    [provider]  ibuprofen  (ADVIL ) 800 MG tablet Take 1 tablet (800 mg total) by mouth every 6 (six) hours as needed. 02/17/23   Tobie Franky SQUIBB, DPM  insulin  glargine, 1 Unit Dial , (TOUJEO  SOLOSTAR) 300 UNIT/ML Solostar Pen Inject 42 Units into the skin daily in the afternoon. Patient taking differently: Inject 64 Units into the skin daily in the afternoon. 04/05/22   Amin, Ankit C, MD  insulin  glargine, 1 Unit Dial , (TOUJEO  SOLOSTAR) 300 UNIT/ML Solostar Pen Inject 68 Units into the skin daily.  Prime pen with 3 units before each use. 10/28/23     Insulin  Lispro Prot & Lispro (HUMALOG  MIX 50/50 KWIKPEN) (50-50) 100 UNIT/ML Kwikpen Inject 34 units at beginning of breakfast  and 26 units at beginning of evening meals Subcutaneous Twice a day 04/02/24     Insulin  Lispro Prot & Lispro (HUMALOG  MIX 50/50 KWIKPEN) (50-50) 100 UNIT/ML Kwikpen Inject 34 Units into the skin in the morning at beginning of breakfast AND 30 Units every evening before meals. 08/17/24     Insulin  Pen Needle (PEN NEEDLES 3/16) 31G X 5 MM MISC 100 each by Does not apply route 4 (four) times daily. 04/05/22   Amin, Ankit C, MD  Insulin  Pen Needle 31G X 5 MM MISC Use 4 (four) times daily. 06/08/24   Faythe Purchase, MD  losartan  (COZAAR ) 50 MG tablet Take 1 tablet (50 mg total) by mouth daily. 08/27/24     Magnesium 100 MG  CAPS Take 100 mg by mouth daily.    [provider]  moxifloxacin  (VIGAMOX ) 0.5 % ophthalmic solution Place 1 drop into the left eye 4 (four) times daily for one week 06/03/24     ondansetron  (ZOFRAN -ODT) 4 MG disintegrating tablet Take 1 tablet (4 mg total) by mouth every 8 (eight) hours as needed for nausea or vomiting. 08/07/22   Horton, Charmaine FALCON, MD  oxyCODONE -acetaminophen  (PERCOCET) 5-325 MG tablet Take 1 tablet by mouth every 4 (four) hours as needed for severe pain. 02/17/23   Tobie Franky SQUIBB, DPM  oxyCODONE -acetaminophen  (PERCOCET) 5-325 MG tablet Take 1 tablet by mouth every 4 (four) hours as needed for severe pain (pain score 7-10). 07/14/23   Tobie Franky SQUIBB, DPM  phenazopyridine  (PYRIDIUM ) 200 MG tablet Take 1 tablet (200 mg total) by mouth 3 (three) times daily. 08/06/22   Zackowski, Scott, MD  Probiotic Product (PROBIOTIC BLEND PO) Take 1 tablet by mouth daily.    [provider]  sulfamethoxazole -trimethoprim  (BACTRIM  DS) 800-160 MG tablet Take 1 tablet by mouth 2 (two) times daily. 02/11/23   Tobie Franky SQUIBB, DPM  TRULICITY  3 MG/0.5ML SOPN Inject 3 mg into the skin once a week. 07/03/22   [provider]  Turmeric 500 MG CAPS Take 500 mg by mouth daily.    [provider]  vitamin C  (ASCORBIC ACID ) 250 MG tablet Take 250 mg by mouth daily.    [provider]  zinc  gluconate 50 MG tablet Take 50 mg by mouth daily.    [provider]  insulin  lispro (HUMALOG ) 100 UNIT/ML KwikPen Inject 4 Units into the skin up to 3 (three) times daily before meals. 03/11/24 04/02/24      Allergies: Codeine, Demerol, Other, and Shellfish allergy    Review of Systems  Constitutional:  Negative for chills and fever.  HENT:  Negative for sore throat.   Eyes:  Negative for visual disturbance.  Respiratory:  Negative for cough and shortness of breath.   Cardiovascular:  Positive for chest pain and palpitations. Negative for leg swelling.  Gastrointestinal:   Positive for nausea. Negative for abdominal pain and vomiting.  Genitourinary:  Negative for dysuria and flank pain.  Musculoskeletal:  Negative for back pain and neck pain.  Neurological:  Negative for syncope and headaches.    Updated Vital Signs BP (!) 167/78   Pulse 82   Temp 98.1 F (36.7 C) (Oral)   Resp 17   SpO2 94%   Physical Exam Vitals and nursing note reviewed.  Constitutional:      Appearance: Normal appearance. She is well-developed.  HENT:     Head: Atraumatic.     Nose: Nose normal.     Mouth/Throat:     Mouth: Mucous  membranes are moist.  Eyes:     General: No scleral icterus.    Conjunctiva/sclera: Conjunctivae normal.     Pupils: Pupils are equal, round, and reactive to light.  Neck:     Trachea: No tracheal deviation.  Cardiovascular:     Rate and Rhythm: Normal rate and regular rhythm.     Pulses: Normal pulses.     Heart sounds: Normal heart sounds. No murmur heard.    No friction rub. No gallop.  Pulmonary:     Effort: Pulmonary effort is normal. No respiratory distress.     Breath sounds: Normal breath sounds.  Abdominal:     General: Bowel sounds are normal. There is no distension.     Palpations: Abdomen is soft.     Tenderness: There is no abdominal tenderness.  Genitourinary:    Comments: No cva tenderness.  Musculoskeletal:        General: No swelling or tenderness.     Cervical back: Normal range of motion and neck supple. No rigidity. No muscular tenderness.     Right lower leg: No edema.     Left lower leg: No edema.  Skin:    General: Skin is warm and dry.     Findings: No rash.  Neurological:     Mental Status: She is alert.     Comments: Alert, speech normal. Motor/sens grossly intact. Steady gait.   Psychiatric:        Mood and Affect: Mood normal.     (all labs ordered are listed, but only abnormal results are displayed) Results for orders placed or performed during the hospital encounter of 09/24/24  Basic metabolic  panel   Collection Time: 09/24/24  9:31 AM  Result Value Ref Range   Sodium 139 135 - 145 mmol/L   Potassium 4.1 3.5 - 5.1 mmol/L   Chloride 103 98 - 111 mmol/L   CO2 25 22 - 32 mmol/L   Glucose, Bld 128 (H) 70 - 99 mg/dL   BUN 13 8 - 23 mg/dL   Creatinine, Ser 9.46 0.44 - 1.00 mg/dL   Calcium  9.9 8.9 - 10.3 mg/dL   GFR, Estimated >39 >39 mL/min   Anion gap 12 5 - 15  CBC   Collection Time: 09/24/24  9:31 AM  Result Value Ref Range   WBC 7.7 4.0 - 10.5 K/uL   RBC 4.88 3.87 - 5.11 MIL/uL   Hemoglobin 14.5 12.0 - 15.0 g/dL   HCT 55.7 63.9 - 53.9 %   MCV 90.6 80.0 - 100.0 fL   MCH 29.7 26.0 - 34.0 pg   MCHC 32.8 30.0 - 36.0 g/dL   RDW 86.7 88.4 - 84.4 %   Platelets 235 150 - 400 K/uL   nRBC 0.0 0.0 - 0.2 %  Troponin T, High Sensitivity   Collection Time: 09/24/24  9:31 AM  Result Value Ref Range   Troponin T High Sensitivity 10 0 - 19 ng/L  Troponin T, High Sensitivity   Collection Time: 09/24/24 11:40 AM  Result Value Ref Range   Troponin T High Sensitivity 10 0 - 19 ng/L      EKG: EKG Interpretation Date/Time:  Friday September 24 2024 09:17:49 EST Ventricular Rate:  86 PR Interval:  152 QRS Duration:  86 QT Interval:  359 QTC Calculation: 430 R Axis:   6  Text Interpretation: Sinus rhythm Baseline wander Confirmed by Bernard Drivers ([id number]) on 09/24/2024 10:25:55 AM  Radiology: [facility] Chest 2 View Result Date: 09/24/2024 CLINICAL  DATA:  Chest pain EXAM: CHEST - 2 VIEW COMPARISON:  August 09, 2022 FINDINGS: The heart size and mediastinal contours are within normal limits. Both lungs are clear. The visualized skeletal structures are unremarkable. IMPRESSION: No active cardiopulmonary disease. Electronically Signed   By: Lynwood Landy Raddle M.D.   On: 09/24/2024 10:09     Procedures   Medications Ordered in the ED  famotidine (PEPCID) tablet 20 mg (20 mg Oral Given 09/24/24 1006)  alum & mag hydroxide-simeth (MAALOX/MYLANTA) 200-200-20 MG/5ML suspension 30 mL (30 mLs Oral  Given 09/24/24 1006)  acetaminophen  (TYLENOL ) tablet 1,000 mg (1,000 mg Oral Given 09/24/24 1006)                                    Medical Decision Making Problems Addressed: Elevated blood pressure reading: acute illness or injury Essential hypertension: chronic illness or injury with exacerbation, progression, or side effects of treatment that poses a threat to life or bodily functions Heartburn: acute illness or injury Precordial chest pain: acute illness or injury with systemic symptoms that poses a threat to life or bodily functions Stress: acute illness or injury    Details: Acute/chronic  Amount and/or Complexity of Data Reviewed External Data Reviewed: notes. Labs: ordered. Decision-making details documented in ED Course. Radiology: ordered and independent interpretation performed. Decision-making details documented in ED Course. ECG/medicine tests: ordered and independent interpretation performed. Decision-making details documented in ED Course.  Risk OTC drugs. Decision regarding hospitalization.   Iv ns. Continuous pulse ox and cardiac monitoring. Labs ordered/sent. Imaging ordered.   Differential diagnosis includes acs, msk cp, gi cp, etc. Dispo decision including potential need for admission considered - will get labs and imaging and reassess.   Reviewed nursing notes and prior charts for additional history. External reports reviewed. Additional history from: EMS.   Cardiac monitor: sinus rhythm, rate 80.  Meds given for symptom relief.   Labs reviewed/interpreted by me - chem unremarkable. Wbc and hgb normal. Trop x 2 normal and not increasing - felt not c/w acs.   Xrays reviewed/interpreted by me - no pna.   Recheck, pt comfortable, no cp or sob. Pt currently appears stable for ed d/c.   Rec close pcp/card f/u.  Return precautions provided.       Final diagnoses:  Precordial chest pain  Elevated blood pressure reading  Essential hypertension   Heartburn  Stress    ED Discharge Orders          Ordered    Ambulatory referral to Cardiology       Comments: If you have not heard from the Cardiology office within the next 72 hours please call [phone].   09/24/24 1334               Bernard Drivers, MD 09/24/24 1336  "

## 2024-09-28 ENCOUNTER — Telehealth: Payer: Self-pay

## 2024-09-28 NOTE — Telephone Encounter (Signed)
 Patient is not feeling well, and will call back to reschedule at a later date.  Appointment cancelled.

## 2024-09-29 ENCOUNTER — Ambulatory Visit: Admitting: Gastroenterology

## 2024-11-02 ENCOUNTER — Ambulatory Visit: Admitting: Gastroenterology

## 2024-11-09 ENCOUNTER — Ambulatory Visit: Admitting: Gastroenterology

## 2025-01-26 ENCOUNTER — Ambulatory Visit: Admitting: Family Medicine
